# Patient Record
Sex: Male | Born: 1955 | Race: White | Hispanic: No | Marital: Married | Smoking: Current every day smoker
Health system: Southern US, Community
[De-identification: ages and names within clinical notes are randomized; demographics above are authoritative.]

## PROBLEM LIST (undated history)

## (undated) DIAGNOSIS — E669 Obesity, unspecified: Secondary | ICD-10-CM

## (undated) DIAGNOSIS — I428 Other cardiomyopathies: Secondary | ICD-10-CM

## (undated) DIAGNOSIS — J449 Chronic obstructive pulmonary disease, unspecified: Secondary | ICD-10-CM

## (undated) DIAGNOSIS — J189 Pneumonia, unspecified organism: Secondary | ICD-10-CM

## (undated) DIAGNOSIS — G8929 Other chronic pain: Secondary | ICD-10-CM

## (undated) DIAGNOSIS — I2510 Atherosclerotic heart disease of native coronary artery without angina pectoris: Secondary | ICD-10-CM

## (undated) DIAGNOSIS — R569 Unspecified convulsions: Secondary | ICD-10-CM

## (undated) DIAGNOSIS — M549 Dorsalgia, unspecified: Secondary | ICD-10-CM

## (undated) DIAGNOSIS — N183 Chronic kidney disease, stage 3 unspecified: Secondary | ICD-10-CM

## (undated) DIAGNOSIS — Z79899 Other long term (current) drug therapy: Secondary | ICD-10-CM

## (undated) DIAGNOSIS — E1140 Type 2 diabetes mellitus with diabetic neuropathy, unspecified: Secondary | ICD-10-CM

## (undated) DIAGNOSIS — K5790 Diverticulosis of intestine, part unspecified, without perforation or abscess without bleeding: Secondary | ICD-10-CM

## (undated) DIAGNOSIS — K3184 Gastroparesis: Secondary | ICD-10-CM

## (undated) DIAGNOSIS — K449 Diaphragmatic hernia without obstruction or gangrene: Secondary | ICD-10-CM

## (undated) DIAGNOSIS — I10 Essential (primary) hypertension: Secondary | ICD-10-CM

## (undated) DIAGNOSIS — J9601 Acute respiratory failure with hypoxia: Secondary | ICD-10-CM

## (undated) DIAGNOSIS — R251 Tremor, unspecified: Secondary | ICD-10-CM

## (undated) DIAGNOSIS — Z9889 Other specified postprocedural states: Secondary | ICD-10-CM

## (undated) DIAGNOSIS — E119 Type 2 diabetes mellitus without complications: Secondary | ICD-10-CM

## (undated) DIAGNOSIS — F329 Major depressive disorder, single episode, unspecified: Secondary | ICD-10-CM

## (undated) DIAGNOSIS — G253 Myoclonus: Secondary | ICD-10-CM

## (undated) DIAGNOSIS — IMO0001 Reserved for inherently not codable concepts without codable children: Secondary | ICD-10-CM

## (undated) DIAGNOSIS — M1990 Unspecified osteoarthritis, unspecified site: Secondary | ICD-10-CM

## (undated) DIAGNOSIS — I509 Heart failure, unspecified: Secondary | ICD-10-CM

## (undated) DIAGNOSIS — F32A Depression, unspecified: Secondary | ICD-10-CM

## (undated) DIAGNOSIS — K210 Gastro-esophageal reflux disease with esophagitis: Secondary | ICD-10-CM

## (undated) DIAGNOSIS — G4730 Sleep apnea, unspecified: Secondary | ICD-10-CM

## (undated) HISTORY — DX: Heart failure, unspecified: I50.9

## (undated) HISTORY — PX: HAND SURGERY: SHX662

## (undated) HISTORY — PX: ANTERIOR LUMBAR CORPECTOMY W/ FUSION: SHX1167

## (undated) HISTORY — PX: OTHER SURGICAL HISTORY: SHX169

## (undated) HISTORY — DX: Obesity, unspecified: E66.9

## (undated) HISTORY — PX: COLONOSCOPY: SHX174

## (undated) HISTORY — DX: Sleep apnea, unspecified: G47.30

## (undated) HISTORY — DX: Major depressive disorder, single episode, unspecified: F32.9

## (undated) HISTORY — DX: Diaphragmatic hernia without obstruction or gangrene: K44.9

## (undated) HISTORY — DX: Type 2 diabetes mellitus with diabetic neuropathy, unspecified: E11.40

## (undated) HISTORY — DX: Other specified postprocedural states: Z98.890

## (undated) HISTORY — PX: KNEE ARTHROSCOPY: SUR90

## (undated) HISTORY — DX: Gastroparesis: K31.84

## (undated) HISTORY — DX: Unspecified osteoarthritis, unspecified site: M19.90

## (undated) HISTORY — DX: Gastro-esophageal reflux disease with esophagitis: K21.0

## (undated) HISTORY — DX: Diverticulosis of intestine, part unspecified, without perforation or abscess without bleeding: K57.90

## (undated) HISTORY — PX: NECK SURGERY: SHX720

## (undated) HISTORY — DX: Reserved for inherently not codable concepts without codable children: IMO0001

## (undated) HISTORY — PX: APPENDECTOMY: SHX54

## (undated) HISTORY — DX: Depression, unspecified: F32.A

---

## 2005-09-04 ENCOUNTER — Ambulatory Visit: Payer: Self-pay | Admitting: Family Medicine

## 2005-09-07 ENCOUNTER — Encounter (INDEPENDENT_AMBULATORY_CARE_PROVIDER_SITE_OTHER): Payer: Self-pay | Admitting: Internal Medicine

## 2005-09-09 ENCOUNTER — Encounter (INDEPENDENT_AMBULATORY_CARE_PROVIDER_SITE_OTHER): Payer: Self-pay | Admitting: Internal Medicine

## 2005-09-11 ENCOUNTER — Ambulatory Visit: Payer: Self-pay | Admitting: Family Medicine

## 2005-09-22 ENCOUNTER — Encounter: Payer: Self-pay | Admitting: Cardiology

## 2005-09-22 ENCOUNTER — Ambulatory Visit: Payer: Self-pay | Admitting: *Deleted

## 2005-09-25 ENCOUNTER — Ambulatory Visit: Payer: Self-pay | Admitting: Cardiology

## 2005-09-25 ENCOUNTER — Ambulatory Visit: Payer: Self-pay | Admitting: Internal Medicine

## 2005-10-05 ENCOUNTER — Ambulatory Visit: Payer: Self-pay | Admitting: Cardiology

## 2005-10-12 ENCOUNTER — Ambulatory Visit: Payer: Self-pay | Admitting: Internal Medicine

## 2005-11-13 ENCOUNTER — Ambulatory Visit: Payer: Self-pay | Admitting: Internal Medicine

## 2005-11-25 ENCOUNTER — Ambulatory Visit: Payer: Self-pay | Admitting: Internal Medicine

## 2006-01-15 ENCOUNTER — Ambulatory Visit: Payer: Self-pay | Admitting: Internal Medicine

## 2006-01-15 LAB — CONVERTED CEMR LAB
Cholesterol: 229 mg/dL
RBC count: 4.54 10*6/uL
Total CHOL/HDL Ratio: 8.2
Triglycerides: 425 mg/dL
aPTT: 25.2 s

## 2006-01-21 ENCOUNTER — Ambulatory Visit: Payer: Self-pay | Admitting: Family Medicine

## 2006-02-12 ENCOUNTER — Ambulatory Visit: Payer: Self-pay | Admitting: Internal Medicine

## 2006-02-12 LAB — CONVERTED CEMR LAB
INR: 1.3
aPTT: 14.3 s

## 2006-03-29 ENCOUNTER — Ambulatory Visit: Payer: Self-pay | Admitting: Internal Medicine

## 2006-04-01 ENCOUNTER — Encounter: Payer: Self-pay | Admitting: Internal Medicine

## 2006-04-01 DIAGNOSIS — M129 Arthropathy, unspecified: Secondary | ICD-10-CM | POA: Insufficient documentation

## 2006-04-01 DIAGNOSIS — I2510 Atherosclerotic heart disease of native coronary artery without angina pectoris: Secondary | ICD-10-CM | POA: Insufficient documentation

## 2006-04-01 DIAGNOSIS — G609 Hereditary and idiopathic neuropathy, unspecified: Secondary | ICD-10-CM | POA: Insufficient documentation

## 2006-04-01 DIAGNOSIS — K219 Gastro-esophageal reflux disease without esophagitis: Secondary | ICD-10-CM

## 2006-04-01 DIAGNOSIS — I420 Dilated cardiomyopathy: Secondary | ICD-10-CM | POA: Insufficient documentation

## 2006-04-01 DIAGNOSIS — F411 Generalized anxiety disorder: Secondary | ICD-10-CM | POA: Insufficient documentation

## 2006-04-01 DIAGNOSIS — M545 Low back pain: Secondary | ICD-10-CM | POA: Insufficient documentation

## 2006-04-01 DIAGNOSIS — E785 Hyperlipidemia, unspecified: Secondary | ICD-10-CM | POA: Insufficient documentation

## 2006-04-01 DIAGNOSIS — I252 Old myocardial infarction: Secondary | ICD-10-CM | POA: Insufficient documentation

## 2006-04-01 DIAGNOSIS — F3289 Other specified depressive episodes: Secondary | ICD-10-CM | POA: Insufficient documentation

## 2006-04-01 DIAGNOSIS — I10 Essential (primary) hypertension: Secondary | ICD-10-CM | POA: Insufficient documentation

## 2006-04-01 DIAGNOSIS — I509 Heart failure, unspecified: Secondary | ICD-10-CM | POA: Insufficient documentation

## 2006-04-01 DIAGNOSIS — F329 Major depressive disorder, single episode, unspecified: Secondary | ICD-10-CM | POA: Insufficient documentation

## 2006-04-26 ENCOUNTER — Ambulatory Visit: Payer: Self-pay | Admitting: Internal Medicine

## 2006-04-26 LAB — CONVERTED CEMR LAB
Eosinophils Relative: 3 %
Hemoglobin: 14.1 g/dL
Monocytes Absolute: 0.6 10*3/uL
Monocytes Relative: 6 %
RBC: 4.85 M/uL
WBC: 10.4 10*3/uL

## 2006-05-11 ENCOUNTER — Ambulatory Visit: Payer: Self-pay | Admitting: Internal Medicine

## 2006-05-11 LAB — CONVERTED CEMR LAB: Prothrombin Time: 15 s

## 2006-05-21 ENCOUNTER — Ambulatory Visit: Payer: Self-pay | Admitting: Internal Medicine

## 2006-05-21 LAB — CONVERTED CEMR LAB: INR: 2.2

## 2006-06-04 ENCOUNTER — Ambulatory Visit: Payer: Self-pay | Admitting: Internal Medicine

## 2006-06-04 DIAGNOSIS — F528 Other sexual dysfunction not due to a substance or known physiological condition: Secondary | ICD-10-CM

## 2006-06-04 LAB — CONVERTED CEMR LAB: INR: 2.4

## 2006-06-05 ENCOUNTER — Encounter (INDEPENDENT_AMBULATORY_CARE_PROVIDER_SITE_OTHER): Payer: Self-pay | Admitting: Internal Medicine

## 2006-06-16 ENCOUNTER — Encounter (INDEPENDENT_AMBULATORY_CARE_PROVIDER_SITE_OTHER): Payer: Self-pay | Admitting: Internal Medicine

## 2006-06-22 ENCOUNTER — Ambulatory Visit: Payer: Self-pay | Admitting: Internal Medicine

## 2006-06-22 LAB — CONVERTED CEMR LAB: Inflenza A Ag: NEGATIVE

## 2006-06-24 ENCOUNTER — Ambulatory Visit: Payer: Self-pay | Admitting: Internal Medicine

## 2006-06-24 LAB — CONVERTED CEMR LAB: Prothrombin Time: 13.7 s

## 2006-07-02 ENCOUNTER — Ambulatory Visit: Payer: Self-pay | Admitting: Internal Medicine

## 2006-07-02 LAB — CONVERTED CEMR LAB
Calcium: 9.5 mg/dL (ref 8.4–10.5)
Chloride: 105 meq/L (ref 96–112)
Glucose, Bld: 398 mg/dL — ABNORMAL HIGH (ref 70–99)
INR: 1.9
INR: 1.9 — ABNORMAL HIGH (ref 0.0–1.5)
Prothrombin Time: 23 s — ABNORMAL HIGH (ref 11.6–15.2)

## 2006-07-20 ENCOUNTER — Encounter (INDEPENDENT_AMBULATORY_CARE_PROVIDER_SITE_OTHER): Payer: Self-pay | Admitting: Internal Medicine

## 2006-07-21 ENCOUNTER — Ambulatory Visit: Payer: Self-pay | Admitting: Internal Medicine

## 2006-07-21 DIAGNOSIS — M720 Palmar fascial fibromatosis [Dupuytren]: Secondary | ICD-10-CM

## 2006-07-21 DIAGNOSIS — R209 Unspecified disturbances of skin sensation: Secondary | ICD-10-CM

## 2006-07-21 LAB — CONVERTED CEMR LAB: Hgb A1c MFr Bld: 9.4 %

## 2006-07-23 ENCOUNTER — Ambulatory Visit (HOSPITAL_COMMUNITY): Admission: RE | Admit: 2006-07-23 | Discharge: 2006-07-23 | Payer: Self-pay | Admitting: Internal Medicine

## 2006-07-23 LAB — CONVERTED CEMR LAB
CO2: 17 meq/L — ABNORMAL LOW (ref 19–32)
Calcium: 9.6 mg/dL (ref 8.4–10.5)
Chloride: 101 meq/L (ref 96–112)
Creatinine, Ser: 1.81 mg/dL — ABNORMAL HIGH (ref 0.40–1.50)
Glucose, Bld: 157 mg/dL — ABNORMAL HIGH (ref 70–99)
Potassium: 4.9 meq/L (ref 3.5–5.3)

## 2006-07-30 ENCOUNTER — Ambulatory Visit: Payer: Self-pay | Admitting: Internal Medicine

## 2006-07-30 DIAGNOSIS — G5600 Carpal tunnel syndrome, unspecified upper limb: Secondary | ICD-10-CM

## 2006-07-31 ENCOUNTER — Encounter (INDEPENDENT_AMBULATORY_CARE_PROVIDER_SITE_OTHER): Payer: Self-pay | Admitting: Internal Medicine

## 2006-08-02 ENCOUNTER — Telehealth (INDEPENDENT_AMBULATORY_CARE_PROVIDER_SITE_OTHER): Payer: Self-pay | Admitting: Internal Medicine

## 2006-08-02 ENCOUNTER — Encounter (INDEPENDENT_AMBULATORY_CARE_PROVIDER_SITE_OTHER): Payer: Self-pay | Admitting: Internal Medicine

## 2006-08-02 LAB — CONVERTED CEMR LAB
Chloride: 101 meq/L (ref 96–112)
Creatinine, Ser: 2.38 mg/dL — ABNORMAL HIGH (ref 0.40–1.50)
Sodium: 131 meq/L — ABNORMAL LOW (ref 135–145)

## 2006-08-03 ENCOUNTER — Ambulatory Visit (HOSPITAL_COMMUNITY): Admission: RE | Admit: 2006-08-03 | Discharge: 2006-08-03 | Payer: Self-pay | Admitting: Internal Medicine

## 2006-08-04 ENCOUNTER — Encounter (INDEPENDENT_AMBULATORY_CARE_PROVIDER_SITE_OTHER): Payer: Self-pay | Admitting: Internal Medicine

## 2006-08-05 ENCOUNTER — Ambulatory Visit: Payer: Self-pay | Admitting: Internal Medicine

## 2006-08-05 LAB — CONVERTED CEMR LAB
Glucose, Bld: 228 mg/dL — ABNORMAL HIGH (ref 70–99)
INR: 1.9
Potassium: 4.8 meq/L (ref 3.5–5.3)
Sodium: 134 meq/L — ABNORMAL LOW (ref 135–145)

## 2006-08-11 ENCOUNTER — Ambulatory Visit: Payer: Self-pay | Admitting: Internal Medicine

## 2006-08-11 ENCOUNTER — Emergency Department (HOSPITAL_COMMUNITY): Admission: EM | Admit: 2006-08-11 | Discharge: 2006-08-11 | Payer: Self-pay | Admitting: Emergency Medicine

## 2006-08-11 LAB — CONVERTED CEMR LAB
Hemoglobin: 13.9 g/dL
Prothrombin Time: 12.2 s

## 2006-08-12 ENCOUNTER — Telehealth (INDEPENDENT_AMBULATORY_CARE_PROVIDER_SITE_OTHER): Payer: Self-pay | Admitting: Internal Medicine

## 2006-08-20 ENCOUNTER — Ambulatory Visit: Payer: Self-pay | Admitting: Internal Medicine

## 2006-08-20 LAB — CONVERTED CEMR LAB: Prothrombin Time: 18.6 s

## 2006-08-31 ENCOUNTER — Encounter (INDEPENDENT_AMBULATORY_CARE_PROVIDER_SITE_OTHER): Payer: Self-pay | Admitting: Internal Medicine

## 2006-09-03 ENCOUNTER — Ambulatory Visit: Payer: Self-pay | Admitting: Internal Medicine

## 2006-09-03 LAB — CONVERTED CEMR LAB: Prothrombin Time: 18.3 s

## 2006-09-06 LAB — CONVERTED CEMR LAB
BUN: 18 mg/dL (ref 6–23)
Calcium: 9 mg/dL (ref 8.4–10.5)
Creatinine, Ser: 1.19 mg/dL (ref 0.40–1.50)
Potassium: 3.6 meq/L (ref 3.5–5.3)

## 2006-10-01 ENCOUNTER — Ambulatory Visit: Payer: Self-pay | Admitting: Internal Medicine

## 2006-10-01 LAB — CONVERTED CEMR LAB: INR: 2.2

## 2006-11-17 ENCOUNTER — Ambulatory Visit: Payer: Self-pay | Admitting: Internal Medicine

## 2006-11-17 DIAGNOSIS — G4700 Insomnia, unspecified: Secondary | ICD-10-CM

## 2006-11-17 LAB — CONVERTED CEMR LAB
INR: 1.5
Prothrombin Time: 15.3 s

## 2006-11-19 ENCOUNTER — Encounter (INDEPENDENT_AMBULATORY_CARE_PROVIDER_SITE_OTHER): Payer: Self-pay | Admitting: Internal Medicine

## 2006-11-28 ENCOUNTER — Encounter (INDEPENDENT_AMBULATORY_CARE_PROVIDER_SITE_OTHER): Payer: Self-pay | Admitting: Internal Medicine

## 2006-12-01 ENCOUNTER — Ambulatory Visit: Payer: Self-pay | Admitting: Internal Medicine

## 2006-12-01 DIAGNOSIS — M542 Cervicalgia: Secondary | ICD-10-CM

## 2006-12-28 ENCOUNTER — Encounter (INDEPENDENT_AMBULATORY_CARE_PROVIDER_SITE_OTHER): Payer: Self-pay | Admitting: Internal Medicine

## 2007-01-03 ENCOUNTER — Ambulatory Visit: Payer: Self-pay | Admitting: Internal Medicine

## 2007-01-03 LAB — CONVERTED CEMR LAB: INR: 1.8

## 2007-01-04 ENCOUNTER — Telehealth (INDEPENDENT_AMBULATORY_CARE_PROVIDER_SITE_OTHER): Payer: Self-pay | Admitting: *Deleted

## 2007-01-13 ENCOUNTER — Encounter (INDEPENDENT_AMBULATORY_CARE_PROVIDER_SITE_OTHER): Payer: Self-pay | Admitting: Internal Medicine

## 2007-01-20 ENCOUNTER — Encounter (INDEPENDENT_AMBULATORY_CARE_PROVIDER_SITE_OTHER): Payer: Self-pay | Admitting: Internal Medicine

## 2007-01-25 ENCOUNTER — Encounter (INDEPENDENT_AMBULATORY_CARE_PROVIDER_SITE_OTHER): Payer: Self-pay | Admitting: Internal Medicine

## 2007-01-31 ENCOUNTER — Ambulatory Visit: Payer: Self-pay | Admitting: Internal Medicine

## 2007-01-31 LAB — CONVERTED CEMR LAB
INR: 1.3
Prothrombin Time: 14.1 s

## 2007-02-15 ENCOUNTER — Encounter (INDEPENDENT_AMBULATORY_CARE_PROVIDER_SITE_OTHER): Payer: Self-pay | Admitting: Internal Medicine

## 2007-02-18 ENCOUNTER — Encounter (INDEPENDENT_AMBULATORY_CARE_PROVIDER_SITE_OTHER): Payer: Self-pay | Admitting: Internal Medicine

## 2007-03-02 ENCOUNTER — Telehealth (INDEPENDENT_AMBULATORY_CARE_PROVIDER_SITE_OTHER): Payer: Self-pay | Admitting: *Deleted

## 2007-03-02 ENCOUNTER — Ambulatory Visit: Payer: Self-pay | Admitting: Internal Medicine

## 2007-03-04 ENCOUNTER — Ambulatory Visit (HOSPITAL_COMMUNITY): Admission: RE | Admit: 2007-03-04 | Discharge: 2007-03-04 | Payer: Self-pay | Admitting: Internal Medicine

## 2007-03-07 ENCOUNTER — Telehealth (INDEPENDENT_AMBULATORY_CARE_PROVIDER_SITE_OTHER): Payer: Self-pay | Admitting: *Deleted

## 2007-03-08 ENCOUNTER — Encounter (INDEPENDENT_AMBULATORY_CARE_PROVIDER_SITE_OTHER): Payer: Self-pay | Admitting: Internal Medicine

## 2007-03-09 ENCOUNTER — Telehealth (INDEPENDENT_AMBULATORY_CARE_PROVIDER_SITE_OTHER): Payer: Self-pay | Admitting: *Deleted

## 2007-03-15 ENCOUNTER — Encounter (INDEPENDENT_AMBULATORY_CARE_PROVIDER_SITE_OTHER): Payer: Self-pay | Admitting: Internal Medicine

## 2007-03-15 LAB — CONVERTED CEMR LAB
ALT: 18 units/L (ref 0–53)
BUN: 25 mg/dL — ABNORMAL HIGH (ref 6–23)
CO2: 23 meq/L (ref 19–32)
Calcium: 9.3 mg/dL (ref 8.4–10.5)
Chloride: 105 meq/L (ref 96–112)
Cholesterol: 263 mg/dL — ABNORMAL HIGH (ref 0–200)
Creatinine, Ser: 1.56 mg/dL — ABNORMAL HIGH (ref 0.40–1.50)
HDL: 28 mg/dL — ABNORMAL LOW (ref >39)
Total Bilirubin: 0.3 mg/dL (ref 0.3–1.2)
Total CHOL/HDL Ratio: 9.4
Triglycerides: 262 mg/dL — ABNORMAL HIGH (ref <150)
VLDL: 52 mg/dL — ABNORMAL HIGH (ref 0–40)

## 2007-03-16 ENCOUNTER — Telehealth (INDEPENDENT_AMBULATORY_CARE_PROVIDER_SITE_OTHER): Payer: Self-pay | Admitting: Internal Medicine

## 2007-03-16 ENCOUNTER — Ambulatory Visit: Payer: Self-pay | Admitting: Internal Medicine

## 2007-03-18 ENCOUNTER — Encounter (INDEPENDENT_AMBULATORY_CARE_PROVIDER_SITE_OTHER): Payer: Self-pay | Admitting: Internal Medicine

## 2007-03-21 ENCOUNTER — Telehealth (INDEPENDENT_AMBULATORY_CARE_PROVIDER_SITE_OTHER): Payer: Self-pay | Admitting: *Deleted

## 2007-03-21 LAB — CONVERTED CEMR LAB
Creatinine, Urine: 90.9 mg/dL
Microalb Creat Ratio: 639.2 mg/g — ABNORMAL HIGH (ref 0.0–30.0)
Microalb, Ur: 58.1 mg/dL — ABNORMAL HIGH (ref 0.00–1.89)

## 2007-03-22 ENCOUNTER — Telehealth (INDEPENDENT_AMBULATORY_CARE_PROVIDER_SITE_OTHER): Payer: Self-pay | Admitting: *Deleted

## 2007-03-28 ENCOUNTER — Encounter (INDEPENDENT_AMBULATORY_CARE_PROVIDER_SITE_OTHER): Payer: Self-pay | Admitting: Internal Medicine

## 2007-05-04 ENCOUNTER — Ambulatory Visit: Payer: Self-pay | Admitting: Internal Medicine

## 2007-05-04 ENCOUNTER — Telehealth (INDEPENDENT_AMBULATORY_CARE_PROVIDER_SITE_OTHER): Payer: Self-pay | Admitting: *Deleted

## 2007-05-04 LAB — CONVERTED CEMR LAB
INR: 1.8
Prothrombin Time: 16.7 s

## 2007-05-05 ENCOUNTER — Telehealth (INDEPENDENT_AMBULATORY_CARE_PROVIDER_SITE_OTHER): Payer: Self-pay | Admitting: *Deleted

## 2007-05-06 ENCOUNTER — Encounter (INDEPENDENT_AMBULATORY_CARE_PROVIDER_SITE_OTHER): Payer: Self-pay | Admitting: Internal Medicine

## 2007-05-10 LAB — CONVERTED CEMR LAB
ALT: 19 units/L (ref 0–53)
AST: 13 units/L (ref 0–37)
Alkaline Phosphatase: 78 units/L (ref 39–117)
CO2: 25 meq/L (ref 19–32)
Creatinine, Ser: 1.8 mg/dL — ABNORMAL HIGH (ref 0.40–1.50)
Sodium: 142 meq/L (ref 135–145)
Total Bilirubin: 0.3 mg/dL (ref 0.3–1.2)
Total Protein: 6.9 g/dL (ref 6.0–8.3)

## 2007-05-19 ENCOUNTER — Telehealth (INDEPENDENT_AMBULATORY_CARE_PROVIDER_SITE_OTHER): Payer: Self-pay | Admitting: *Deleted

## 2007-05-20 ENCOUNTER — Encounter (INDEPENDENT_AMBULATORY_CARE_PROVIDER_SITE_OTHER): Payer: Self-pay | Admitting: Internal Medicine

## 2007-05-26 ENCOUNTER — Ambulatory Visit: Payer: Self-pay | Admitting: Internal Medicine

## 2007-05-26 ENCOUNTER — Telehealth (INDEPENDENT_AMBULATORY_CARE_PROVIDER_SITE_OTHER): Payer: Self-pay | Admitting: *Deleted

## 2007-05-26 LAB — CONVERTED CEMR LAB: Prothrombin Time: 25 s

## 2007-06-10 ENCOUNTER — Ambulatory Visit: Payer: Self-pay | Admitting: Internal Medicine

## 2007-06-10 LAB — CONVERTED CEMR LAB
Hgb A1c MFr Bld: 10.2 %
INR: 3.9
Prothrombin Time: 23.9 s

## 2007-06-11 ENCOUNTER — Encounter (INDEPENDENT_AMBULATORY_CARE_PROVIDER_SITE_OTHER): Payer: Self-pay | Admitting: Internal Medicine

## 2007-06-12 LAB — CONVERTED CEMR LAB
ALT: 19 units/L (ref 0–53)
AST: 16 units/L (ref 0–37)
Albumin: 4.1 g/dL (ref 3.5–5.2)
Alkaline Phosphatase: 75 units/L (ref 39–117)
BUN: 31 mg/dL — ABNORMAL HIGH (ref 6–23)
Potassium: 4.9 meq/L (ref 3.5–5.3)

## 2007-06-13 ENCOUNTER — Telehealth (INDEPENDENT_AMBULATORY_CARE_PROVIDER_SITE_OTHER): Payer: Self-pay | Admitting: *Deleted

## 2007-06-14 ENCOUNTER — Telehealth (INDEPENDENT_AMBULATORY_CARE_PROVIDER_SITE_OTHER): Payer: Self-pay | Admitting: Internal Medicine

## 2007-06-27 ENCOUNTER — Encounter (INDEPENDENT_AMBULATORY_CARE_PROVIDER_SITE_OTHER): Payer: Self-pay | Admitting: Internal Medicine

## 2007-07-01 ENCOUNTER — Ambulatory Visit: Payer: Self-pay | Admitting: Internal Medicine

## 2007-07-05 ENCOUNTER — Encounter (INDEPENDENT_AMBULATORY_CARE_PROVIDER_SITE_OTHER): Payer: Self-pay | Admitting: Internal Medicine

## 2007-07-29 ENCOUNTER — Encounter (INDEPENDENT_AMBULATORY_CARE_PROVIDER_SITE_OTHER): Payer: Self-pay | Admitting: Internal Medicine

## 2007-08-03 ENCOUNTER — Ambulatory Visit: Payer: Self-pay | Admitting: Internal Medicine

## 2007-08-03 LAB — CONVERTED CEMR LAB
INR: 3.2
Prothrombin Time: 21.5 s

## 2007-08-16 ENCOUNTER — Encounter (INDEPENDENT_AMBULATORY_CARE_PROVIDER_SITE_OTHER): Payer: Self-pay | Admitting: Internal Medicine

## 2007-08-22 ENCOUNTER — Encounter (INDEPENDENT_AMBULATORY_CARE_PROVIDER_SITE_OTHER): Payer: Self-pay | Admitting: Internal Medicine

## 2007-08-31 ENCOUNTER — Ambulatory Visit: Payer: Self-pay | Admitting: Internal Medicine

## 2007-08-31 LAB — CONVERTED CEMR LAB: Prothrombin Time: 30.8 s

## 2007-09-01 ENCOUNTER — Telehealth (INDEPENDENT_AMBULATORY_CARE_PROVIDER_SITE_OTHER): Payer: Self-pay | Admitting: Internal Medicine

## 2007-09-01 ENCOUNTER — Encounter (INDEPENDENT_AMBULATORY_CARE_PROVIDER_SITE_OTHER): Payer: Self-pay | Admitting: Internal Medicine

## 2007-09-01 LAB — CONVERTED CEMR LAB
AST: 17 units/L (ref 0–37)
Alkaline Phosphatase: 75 units/L (ref 39–117)
BUN: 31 mg/dL — ABNORMAL HIGH (ref 6–23)
Basophils Relative: 1 % (ref 0–1)
Calcium: 8.6 mg/dL (ref 8.4–10.5)
Chloride: 103 meq/L (ref 96–112)
Creatinine, Ser: 1.82 mg/dL — ABNORMAL HIGH (ref 0.40–1.50)
Eosinophils Absolute: 0.3 10*3/uL (ref 0.0–0.7)
Eosinophils Relative: 3 % (ref 0–5)
HDL: 26 mg/dL — ABNORMAL LOW (ref >39)
Hemoglobin: 13.5 g/dL (ref 13.0–17.0)
MCHC: 33.6 g/dL (ref 30.0–36.0)
MCV: 84.1 fL (ref 78.0–100.0)
Monocytes Absolute: 0.6 10*3/uL (ref 0.1–1.0)
Monocytes Relative: 7 % (ref 3–12)
RBC: 4.78 M/uL (ref 4.22–5.81)
Total Bilirubin: 0.3 mg/dL (ref 0.3–1.2)
Total CHOL/HDL Ratio: 4.8

## 2007-09-20 ENCOUNTER — Ambulatory Visit: Payer: Self-pay | Admitting: Internal Medicine

## 2007-09-20 LAB — CONVERTED CEMR LAB: Prothrombin Time: 24.8 s

## 2007-10-11 ENCOUNTER — Ambulatory Visit: Payer: Self-pay | Admitting: Internal Medicine

## 2007-10-19 ENCOUNTER — Ambulatory Visit: Payer: Self-pay | Admitting: Internal Medicine

## 2007-10-19 LAB — CONVERTED CEMR LAB
Hemoglobin: 11.2 g/dL
Inflenza A Ag: NEGATIVE
OCCULT 1: NEGATIVE

## 2007-11-01 ENCOUNTER — Ambulatory Visit: Payer: Self-pay | Admitting: Internal Medicine

## 2007-11-01 LAB — CONVERTED CEMR LAB: Prothrombin Time: 16.6 s

## 2007-11-22 ENCOUNTER — Ambulatory Visit: Payer: Self-pay | Admitting: Internal Medicine

## 2007-12-09 ENCOUNTER — Encounter (INDEPENDENT_AMBULATORY_CARE_PROVIDER_SITE_OTHER): Payer: Self-pay | Admitting: Internal Medicine

## 2007-12-12 ENCOUNTER — Encounter (INDEPENDENT_AMBULATORY_CARE_PROVIDER_SITE_OTHER): Payer: Self-pay | Admitting: Internal Medicine

## 2007-12-13 ENCOUNTER — Ambulatory Visit: Payer: Self-pay | Admitting: Internal Medicine

## 2007-12-13 DIAGNOSIS — G2581 Restless legs syndrome: Secondary | ICD-10-CM

## 2007-12-13 LAB — CONVERTED CEMR LAB
Blood Glucose, Fingerstick: 365
INR: 1.7

## 2007-12-15 ENCOUNTER — Encounter (INDEPENDENT_AMBULATORY_CARE_PROVIDER_SITE_OTHER): Payer: Self-pay | Admitting: Internal Medicine

## 2007-12-20 ENCOUNTER — Encounter (INDEPENDENT_AMBULATORY_CARE_PROVIDER_SITE_OTHER): Payer: Self-pay | Admitting: Internal Medicine

## 2007-12-21 ENCOUNTER — Encounter (INDEPENDENT_AMBULATORY_CARE_PROVIDER_SITE_OTHER): Payer: Self-pay | Admitting: Internal Medicine

## 2007-12-23 ENCOUNTER — Ambulatory Visit: Payer: Self-pay | Admitting: Internal Medicine

## 2007-12-23 LAB — CONVERTED CEMR LAB: INR: 1.8

## 2007-12-26 ENCOUNTER — Encounter (INDEPENDENT_AMBULATORY_CARE_PROVIDER_SITE_OTHER): Payer: Self-pay | Admitting: Internal Medicine

## 2007-12-27 ENCOUNTER — Encounter (INDEPENDENT_AMBULATORY_CARE_PROVIDER_SITE_OTHER): Payer: Self-pay | Admitting: Internal Medicine

## 2008-01-03 ENCOUNTER — Telehealth (INDEPENDENT_AMBULATORY_CARE_PROVIDER_SITE_OTHER): Payer: Self-pay | Admitting: Internal Medicine

## 2008-01-03 ENCOUNTER — Encounter (INDEPENDENT_AMBULATORY_CARE_PROVIDER_SITE_OTHER): Payer: Self-pay | Admitting: Internal Medicine

## 2008-01-05 ENCOUNTER — Ambulatory Visit: Payer: Self-pay | Admitting: Internal Medicine

## 2008-01-05 LAB — CONVERTED CEMR LAB: INR: 6.4

## 2008-01-06 ENCOUNTER — Ambulatory Visit (HOSPITAL_COMMUNITY): Admission: RE | Admit: 2008-01-06 | Discharge: 2008-01-06 | Payer: Self-pay

## 2008-01-09 ENCOUNTER — Encounter (INDEPENDENT_AMBULATORY_CARE_PROVIDER_SITE_OTHER): Payer: Self-pay | Admitting: Internal Medicine

## 2008-01-11 ENCOUNTER — Encounter (INDEPENDENT_AMBULATORY_CARE_PROVIDER_SITE_OTHER): Payer: Self-pay | Admitting: Internal Medicine

## 2008-01-12 ENCOUNTER — Telehealth (INDEPENDENT_AMBULATORY_CARE_PROVIDER_SITE_OTHER): Payer: Self-pay | Admitting: *Deleted

## 2008-01-16 ENCOUNTER — Ambulatory Visit: Payer: Self-pay | Admitting: Internal Medicine

## 2008-01-19 ENCOUNTER — Telehealth (INDEPENDENT_AMBULATORY_CARE_PROVIDER_SITE_OTHER): Payer: Self-pay | Admitting: Internal Medicine

## 2008-01-23 ENCOUNTER — Encounter (INDEPENDENT_AMBULATORY_CARE_PROVIDER_SITE_OTHER): Payer: Self-pay | Admitting: Internal Medicine

## 2008-01-24 ENCOUNTER — Encounter (INDEPENDENT_AMBULATORY_CARE_PROVIDER_SITE_OTHER): Payer: Self-pay | Admitting: Internal Medicine

## 2008-01-25 ENCOUNTER — Telehealth (INDEPENDENT_AMBULATORY_CARE_PROVIDER_SITE_OTHER): Payer: Self-pay | Admitting: Internal Medicine

## 2008-01-30 ENCOUNTER — Ambulatory Visit: Payer: Self-pay | Admitting: Internal Medicine

## 2008-02-03 ENCOUNTER — Telehealth (INDEPENDENT_AMBULATORY_CARE_PROVIDER_SITE_OTHER): Payer: Self-pay | Admitting: *Deleted

## 2008-02-17 ENCOUNTER — Encounter (INDEPENDENT_AMBULATORY_CARE_PROVIDER_SITE_OTHER): Payer: Self-pay | Admitting: Internal Medicine

## 2008-03-01 ENCOUNTER — Ambulatory Visit: Payer: Self-pay | Admitting: Internal Medicine

## 2008-03-01 LAB — CONVERTED CEMR LAB: Blood Glucose, Fingerstick: 207

## 2008-03-06 ENCOUNTER — Encounter (INDEPENDENT_AMBULATORY_CARE_PROVIDER_SITE_OTHER): Payer: Self-pay | Admitting: Internal Medicine

## 2008-03-12 ENCOUNTER — Encounter (INDEPENDENT_AMBULATORY_CARE_PROVIDER_SITE_OTHER): Payer: Self-pay | Admitting: Internal Medicine

## 2008-03-14 ENCOUNTER — Ambulatory Visit: Payer: Self-pay | Admitting: Internal Medicine

## 2008-03-15 ENCOUNTER — Encounter
Admission: RE | Admit: 2008-03-15 | Discharge: 2008-06-13 | Payer: Self-pay | Admitting: Physical Medicine & Rehabilitation

## 2008-03-16 ENCOUNTER — Ambulatory Visit: Payer: Self-pay | Admitting: Physical Medicine & Rehabilitation

## 2008-04-02 ENCOUNTER — Ambulatory Visit: Payer: Self-pay | Admitting: Physical Medicine & Rehabilitation

## 2008-04-30 ENCOUNTER — Ambulatory Visit: Payer: Self-pay | Admitting: Physical Medicine & Rehabilitation

## 2008-05-09 ENCOUNTER — Telehealth (INDEPENDENT_AMBULATORY_CARE_PROVIDER_SITE_OTHER): Payer: Self-pay | Admitting: *Deleted

## 2008-05-23 ENCOUNTER — Telehealth (INDEPENDENT_AMBULATORY_CARE_PROVIDER_SITE_OTHER): Payer: Self-pay | Admitting: *Deleted

## 2008-05-30 ENCOUNTER — Encounter (INDEPENDENT_AMBULATORY_CARE_PROVIDER_SITE_OTHER): Payer: Self-pay | Admitting: Internal Medicine

## 2008-05-31 ENCOUNTER — Encounter (INDEPENDENT_AMBULATORY_CARE_PROVIDER_SITE_OTHER): Payer: Self-pay | Admitting: Internal Medicine

## 2008-05-31 ENCOUNTER — Ambulatory Visit: Payer: Self-pay | Admitting: Physical Medicine & Rehabilitation

## 2008-06-04 ENCOUNTER — Ambulatory Visit: Payer: Self-pay | Admitting: Internal Medicine

## 2008-06-04 DIAGNOSIS — M25579 Pain in unspecified ankle and joints of unspecified foot: Secondary | ICD-10-CM | POA: Insufficient documentation

## 2008-06-05 LAB — CONVERTED CEMR LAB
Albumin: 4.1 g/dL (ref 3.5–5.2)
Alkaline Phosphatase: 80 units/L (ref 39–117)
BUN: 27 mg/dL — ABNORMAL HIGH (ref 6–23)
Cholesterol: 169 mg/dL (ref 0–200)
Eosinophils Absolute: 0.4 10*3/uL (ref 0.0–0.7)
Eosinophils Relative: 4 % (ref 0–5)
Glucose, Bld: 244 mg/dL — ABNORMAL HIGH (ref 70–99)
HCT: 38.2 % — ABNORMAL LOW (ref 39.0–52.0)
HDL: 32 mg/dL — ABNORMAL LOW (ref >39)
Hemoglobin: 12.8 g/dL — ABNORMAL LOW (ref 13.0–17.0)
LDL Cholesterol: 67 mg/dL (ref 0–99)
Lymphs Abs: 2.9 10*3/uL (ref 0.7–4.0)
MCV: 84 fL (ref 78.0–100.0)
Monocytes Absolute: 0.8 10*3/uL (ref 0.1–1.0)
Monocytes Relative: 9 % (ref 3–12)
RBC: 4.55 M/uL (ref 4.22–5.81)
Total Bilirubin: 0.3 mg/dL (ref 0.3–1.2)
Triglycerides: 352 mg/dL — ABNORMAL HIGH (ref <150)
VLDL: 70 mg/dL — ABNORMAL HIGH (ref 0–40)
WBC: 9.6 10*3/uL (ref 4.0–10.5)

## 2008-06-06 ENCOUNTER — Ambulatory Visit (HOSPITAL_COMMUNITY): Admission: RE | Admit: 2008-06-06 | Discharge: 2008-06-06 | Payer: Self-pay | Admitting: Internal Medicine

## 2008-06-15 ENCOUNTER — Encounter (INDEPENDENT_AMBULATORY_CARE_PROVIDER_SITE_OTHER): Payer: Self-pay | Admitting: Internal Medicine

## 2008-06-18 ENCOUNTER — Encounter (INDEPENDENT_AMBULATORY_CARE_PROVIDER_SITE_OTHER): Payer: Self-pay | Admitting: Internal Medicine

## 2008-06-20 ENCOUNTER — Ambulatory Visit: Payer: Self-pay | Admitting: Internal Medicine

## 2008-06-21 ENCOUNTER — Telehealth (INDEPENDENT_AMBULATORY_CARE_PROVIDER_SITE_OTHER): Payer: Self-pay | Admitting: Internal Medicine

## 2008-06-25 ENCOUNTER — Encounter
Admission: RE | Admit: 2008-06-25 | Discharge: 2008-09-23 | Payer: Self-pay | Admitting: Physical Medicine & Rehabilitation

## 2008-06-26 ENCOUNTER — Encounter (INDEPENDENT_AMBULATORY_CARE_PROVIDER_SITE_OTHER): Payer: Self-pay | Admitting: Internal Medicine

## 2008-06-26 ENCOUNTER — Ambulatory Visit: Payer: Self-pay | Admitting: Physical Medicine & Rehabilitation

## 2008-06-28 ENCOUNTER — Telehealth (INDEPENDENT_AMBULATORY_CARE_PROVIDER_SITE_OTHER): Payer: Self-pay | Admitting: Internal Medicine

## 2008-07-04 ENCOUNTER — Ambulatory Visit: Payer: Self-pay | Admitting: Internal Medicine

## 2008-07-04 DIAGNOSIS — N189 Chronic kidney disease, unspecified: Secondary | ICD-10-CM

## 2008-07-04 DIAGNOSIS — D649 Anemia, unspecified: Secondary | ICD-10-CM

## 2008-07-25 ENCOUNTER — Telehealth (INDEPENDENT_AMBULATORY_CARE_PROVIDER_SITE_OTHER): Payer: Self-pay | Admitting: Internal Medicine

## 2008-07-30 ENCOUNTER — Ambulatory Visit: Payer: Self-pay | Admitting: Physical Medicine & Rehabilitation

## 2008-07-31 ENCOUNTER — Ambulatory Visit: Payer: Self-pay | Admitting: Internal Medicine

## 2008-07-31 ENCOUNTER — Telehealth (INDEPENDENT_AMBULATORY_CARE_PROVIDER_SITE_OTHER): Payer: Self-pay | Admitting: Internal Medicine

## 2008-07-31 DIAGNOSIS — M79609 Pain in unspecified limb: Secondary | ICD-10-CM

## 2008-08-01 ENCOUNTER — Encounter (INDEPENDENT_AMBULATORY_CARE_PROVIDER_SITE_OTHER): Payer: Self-pay | Admitting: Internal Medicine

## 2008-08-15 ENCOUNTER — Encounter (INDEPENDENT_AMBULATORY_CARE_PROVIDER_SITE_OTHER): Payer: Self-pay | Admitting: Internal Medicine

## 2008-08-17 ENCOUNTER — Encounter (INDEPENDENT_AMBULATORY_CARE_PROVIDER_SITE_OTHER): Payer: Self-pay | Admitting: Internal Medicine

## 2008-08-20 ENCOUNTER — Encounter (INDEPENDENT_AMBULATORY_CARE_PROVIDER_SITE_OTHER): Payer: Self-pay | Admitting: Internal Medicine

## 2008-08-22 ENCOUNTER — Encounter (INDEPENDENT_AMBULATORY_CARE_PROVIDER_SITE_OTHER): Payer: Self-pay | Admitting: Internal Medicine

## 2008-08-31 ENCOUNTER — Encounter (INDEPENDENT_AMBULATORY_CARE_PROVIDER_SITE_OTHER): Payer: Self-pay | Admitting: Internal Medicine

## 2008-09-04 ENCOUNTER — Ambulatory Visit: Payer: Self-pay | Admitting: Internal Medicine

## 2008-09-12 ENCOUNTER — Encounter (INDEPENDENT_AMBULATORY_CARE_PROVIDER_SITE_OTHER): Payer: Self-pay | Admitting: Internal Medicine

## 2008-09-26 ENCOUNTER — Encounter (INDEPENDENT_AMBULATORY_CARE_PROVIDER_SITE_OTHER): Payer: Self-pay | Admitting: Orthopedic Surgery

## 2008-09-26 ENCOUNTER — Ambulatory Visit (HOSPITAL_COMMUNITY): Admission: RE | Admit: 2008-09-26 | Discharge: 2008-09-26 | Payer: Self-pay | Admitting: Orthopedic Surgery

## 2008-09-27 ENCOUNTER — Encounter
Admission: RE | Admit: 2008-09-27 | Discharge: 2008-12-26 | Payer: Self-pay | Admitting: Physical Medicine & Rehabilitation

## 2008-09-27 ENCOUNTER — Ambulatory Visit: Payer: Self-pay | Admitting: Physical Medicine & Rehabilitation

## 2008-10-01 ENCOUNTER — Encounter (INDEPENDENT_AMBULATORY_CARE_PROVIDER_SITE_OTHER): Payer: Self-pay | Admitting: Internal Medicine

## 2008-10-03 ENCOUNTER — Encounter (INDEPENDENT_AMBULATORY_CARE_PROVIDER_SITE_OTHER): Payer: Self-pay | Admitting: Internal Medicine

## 2008-10-16 ENCOUNTER — Ambulatory Visit: Payer: Self-pay | Admitting: Physical Medicine & Rehabilitation

## 2008-10-30 ENCOUNTER — Telehealth (INDEPENDENT_AMBULATORY_CARE_PROVIDER_SITE_OTHER): Payer: Self-pay | Admitting: *Deleted

## 2008-10-31 ENCOUNTER — Ambulatory Visit: Payer: Self-pay | Admitting: Internal Medicine

## 2008-10-31 DIAGNOSIS — IMO0001 Reserved for inherently not codable concepts without codable children: Secondary | ICD-10-CM

## 2008-11-02 ENCOUNTER — Encounter (INDEPENDENT_AMBULATORY_CARE_PROVIDER_SITE_OTHER): Payer: Self-pay | Admitting: Internal Medicine

## 2008-11-02 LAB — CONVERTED CEMR LAB
ALT: 16 units/L (ref 0–53)
AST: 14 units/L (ref 0–37)
Albumin: 4.1 g/dL (ref 3.5–5.2)
Alkaline Phosphatase: 78 units/L (ref 39–117)
Anti Nuclear Antibody(ANA): NEGATIVE
BUN: 45 mg/dL — ABNORMAL HIGH (ref 6–23)
Basophils Absolute: 0.1 10*3/uL (ref 0.0–0.1)
Basophils Relative: 1 % (ref 0–1)
CO2: 20 meq/L (ref 19–32)
CRP: 0.4 mg/dL (ref <0.6)
Calcium: 9.7 mg/dL (ref 8.4–10.5)
Chloride: 104 meq/L (ref 96–112)
Creatinine, Ser: 2.08 mg/dL — ABNORMAL HIGH (ref 0.40–1.50)
Eosinophils Absolute: 0.4 10*3/uL (ref 0.0–0.7)
Eosinophils Relative: 4 % (ref 0–5)
Free T4: 1.07 ng/dL (ref 0.80–1.80)
Glucose, Bld: 300 mg/dL — ABNORMAL HIGH (ref 70–99)
HCT: 38.9 % — ABNORMAL LOW (ref 39.0–52.0)
Hemoglobin: 13.4 g/dL (ref 13.0–17.0)
Lymphocytes Relative: 23 % (ref 12–46)
Lymphs Abs: 2.2 10*3/uL (ref 0.7–4.0)
MCHC: 34.4 g/dL (ref 30.0–36.0)
MCV: 81.7 fL (ref 78.0–100.0)
Monocytes Absolute: 0.6 10*3/uL (ref 0.1–1.0)
Monocytes Relative: 6 % (ref 3–12)
Neutro Abs: 6.5 10*3/uL (ref 1.7–7.7)
Neutrophils Relative %: 67 % (ref 43–77)
Platelets: 229 10*3/uL (ref 150–400)
Potassium: 4.3 meq/L (ref 3.5–5.3)
RBC: 4.76 M/uL (ref 4.22–5.81)
RDW: 13.6 % (ref 11.5–15.5)
Sed Rate: 27 mm/hr — ABNORMAL HIGH (ref 0–16)
Sodium: 140 meq/L (ref 135–145)
TSH: 0.59 microintl units/mL (ref 0.350–4.500)
Total Bilirubin: 0.3 mg/dL (ref 0.3–1.2)
Total CK: 130 units/L (ref 7–232)
Total Protein: 6.8 g/dL (ref 6.0–8.3)
Vitamin B-12: 389 pg/mL (ref 211–911)
WBC: 9.8 10*3/uL (ref 4.0–10.5)

## 2008-11-12 ENCOUNTER — Encounter (INDEPENDENT_AMBULATORY_CARE_PROVIDER_SITE_OTHER): Payer: Self-pay | Admitting: Internal Medicine

## 2008-11-14 ENCOUNTER — Encounter (INDEPENDENT_AMBULATORY_CARE_PROVIDER_SITE_OTHER): Payer: Self-pay | Admitting: Internal Medicine

## 2008-11-15 ENCOUNTER — Ambulatory Visit: Payer: Self-pay | Admitting: Physical Medicine & Rehabilitation

## 2008-11-23 ENCOUNTER — Encounter: Admission: RE | Admit: 2008-11-23 | Discharge: 2008-11-23 | Payer: Self-pay | Admitting: Neurology

## 2008-12-06 ENCOUNTER — Encounter (INDEPENDENT_AMBULATORY_CARE_PROVIDER_SITE_OTHER): Payer: Self-pay | Admitting: Internal Medicine

## 2008-12-10 ENCOUNTER — Ambulatory Visit: Payer: Self-pay | Admitting: Internal Medicine

## 2008-12-10 DIAGNOSIS — R413 Other amnesia: Secondary | ICD-10-CM

## 2009-01-01 ENCOUNTER — Encounter
Admission: RE | Admit: 2009-01-01 | Discharge: 2009-04-01 | Payer: Self-pay | Admitting: Physical Medicine & Rehabilitation

## 2009-01-03 ENCOUNTER — Encounter (INDEPENDENT_AMBULATORY_CARE_PROVIDER_SITE_OTHER): Payer: Self-pay | Admitting: Internal Medicine

## 2009-01-03 ENCOUNTER — Ambulatory Visit: Payer: Self-pay | Admitting: Physical Medicine & Rehabilitation

## 2009-01-10 ENCOUNTER — Encounter (INDEPENDENT_AMBULATORY_CARE_PROVIDER_SITE_OTHER): Payer: Self-pay | Admitting: Internal Medicine

## 2009-01-23 ENCOUNTER — Encounter (INDEPENDENT_AMBULATORY_CARE_PROVIDER_SITE_OTHER): Payer: Self-pay | Admitting: Internal Medicine

## 2009-02-07 ENCOUNTER — Ambulatory Visit: Payer: Self-pay | Admitting: Physical Medicine & Rehabilitation

## 2009-03-18 ENCOUNTER — Ambulatory Visit: Payer: Self-pay | Admitting: Physical Medicine & Rehabilitation

## 2009-04-11 ENCOUNTER — Ambulatory Visit (HOSPITAL_BASED_OUTPATIENT_CLINIC_OR_DEPARTMENT_OTHER): Admission: RE | Admit: 2009-04-11 | Discharge: 2009-04-11 | Payer: Self-pay

## 2009-04-13 ENCOUNTER — Ambulatory Visit: Payer: Self-pay | Admitting: Internal Medicine

## 2009-05-10 ENCOUNTER — Encounter
Admission: RE | Admit: 2009-05-10 | Discharge: 2009-06-19 | Payer: Self-pay | Admitting: Physical Medicine & Rehabilitation

## 2009-05-13 ENCOUNTER — Ambulatory Visit: Payer: Self-pay | Admitting: Physical Medicine & Rehabilitation

## 2009-05-30 ENCOUNTER — Observation Stay (HOSPITAL_COMMUNITY): Admission: EM | Admit: 2009-05-30 | Discharge: 2009-05-31 | Payer: Self-pay | Admitting: Emergency Medicine

## 2009-06-19 ENCOUNTER — Ambulatory Visit: Payer: Self-pay | Admitting: Physical Medicine & Rehabilitation

## 2009-07-07 ENCOUNTER — Encounter: Payer: Self-pay | Admitting: Internal Medicine

## 2009-09-10 ENCOUNTER — Other Ambulatory Visit: Admission: RE | Admit: 2009-09-10 | Discharge: 2009-09-10 | Payer: Self-pay | Admitting: Interventional Radiology

## 2009-09-10 ENCOUNTER — Encounter: Admission: RE | Admit: 2009-09-10 | Discharge: 2009-09-10 | Payer: Self-pay | Admitting: Family Medicine

## 2009-09-19 ENCOUNTER — Ambulatory Visit: Payer: Self-pay | Admitting: *Deleted

## 2009-12-30 ENCOUNTER — Emergency Department (HOSPITAL_COMMUNITY): Admission: EM | Admit: 2009-12-30 | Discharge: 2009-12-30 | Payer: Self-pay | Admitting: Emergency Medicine

## 2010-05-18 ENCOUNTER — Encounter: Payer: Self-pay | Admitting: Internal Medicine

## 2010-05-19 ENCOUNTER — Encounter: Payer: Self-pay | Admitting: Internal Medicine

## 2010-05-27 NOTE — Letter (Signed)
Summary: CMN for CPAP Supplies/[facility]  CMN for CPAP Supplies/[facility]   Imported By: Sherian Rein 07/10/2009 08:04:26  _____________________________________________________________________  External Attachment:    Type:   Image     Comment:   External Document

## 2010-06-03 ENCOUNTER — Ambulatory Visit: Payer: Self-pay | Admitting: Physical Medicine & Rehabilitation

## 2010-06-13 ENCOUNTER — Encounter: Payer: MEDICARE | Attending: Physical Medicine & Rehabilitation

## 2010-06-13 ENCOUNTER — Ambulatory Visit (HOSPITAL_BASED_OUTPATIENT_CLINIC_OR_DEPARTMENT_OTHER): Payer: MEDICARE | Admitting: Physical Medicine & Rehabilitation

## 2010-06-13 DIAGNOSIS — M4716 Other spondylosis with myelopathy, lumbar region: Secondary | ICD-10-CM | POA: Insufficient documentation

## 2010-06-13 DIAGNOSIS — R0602 Shortness of breath: Secondary | ICD-10-CM | POA: Insufficient documentation

## 2010-06-13 DIAGNOSIS — N289 Disorder of kidney and ureter, unspecified: Secondary | ICD-10-CM | POA: Insufficient documentation

## 2010-06-13 DIAGNOSIS — E1142 Type 2 diabetes mellitus with diabetic polyneuropathy: Secondary | ICD-10-CM | POA: Insufficient documentation

## 2010-06-13 DIAGNOSIS — F3289 Other specified depressive episodes: Secondary | ICD-10-CM | POA: Insufficient documentation

## 2010-06-13 DIAGNOSIS — M5137 Other intervertebral disc degeneration, lumbosacral region: Secondary | ICD-10-CM | POA: Insufficient documentation

## 2010-06-13 DIAGNOSIS — F17200 Nicotine dependence, unspecified, uncomplicated: Secondary | ICD-10-CM | POA: Insufficient documentation

## 2010-06-13 DIAGNOSIS — M961 Postlaminectomy syndrome, not elsewhere classified: Secondary | ICD-10-CM | POA: Insufficient documentation

## 2010-06-13 DIAGNOSIS — M51379 Other intervertebral disc degeneration, lumbosacral region without mention of lumbar back pain or lower extremity pain: Secondary | ICD-10-CM | POA: Insufficient documentation

## 2010-06-13 DIAGNOSIS — F329 Major depressive disorder, single episode, unspecified: Secondary | ICD-10-CM | POA: Insufficient documentation

## 2010-06-13 DIAGNOSIS — E1149 Type 2 diabetes mellitus with other diabetic neurological complication: Secondary | ICD-10-CM | POA: Insufficient documentation

## 2010-06-13 DIAGNOSIS — M47817 Spondylosis without myelopathy or radiculopathy, lumbosacral region: Secondary | ICD-10-CM

## 2010-06-13 DIAGNOSIS — M129 Arthropathy, unspecified: Secondary | ICD-10-CM | POA: Insufficient documentation

## 2010-06-13 DIAGNOSIS — I10 Essential (primary) hypertension: Secondary | ICD-10-CM | POA: Insufficient documentation

## 2010-07-05 ENCOUNTER — Emergency Department (HOSPITAL_COMMUNITY): Payer: MEDICARE

## 2010-07-05 ENCOUNTER — Inpatient Hospital Stay (HOSPITAL_COMMUNITY)
Admission: EM | Admit: 2010-07-05 | Discharge: 2010-07-10 | DRG: 637 | Disposition: A | Discharge status: Home / Self Care | Payer: MEDICARE | Attending: Internal Medicine | Admitting: Internal Medicine

## 2010-07-05 DIAGNOSIS — E876 Hypokalemia: Secondary | ICD-10-CM | POA: Diagnosis not present

## 2010-07-05 DIAGNOSIS — E785 Hyperlipidemia, unspecified: Secondary | ICD-10-CM | POA: Diagnosis present

## 2010-07-05 DIAGNOSIS — I2589 Other forms of chronic ischemic heart disease: Secondary | ICD-10-CM | POA: Diagnosis present

## 2010-07-05 DIAGNOSIS — E1310 Other specified diabetes mellitus with ketoacidosis without coma: Principal | ICD-10-CM | POA: Diagnosis present

## 2010-07-05 DIAGNOSIS — Z794 Long term (current) use of insulin: Secondary | ICD-10-CM

## 2010-07-05 DIAGNOSIS — N183 Chronic kidney disease, stage 3 unspecified: Secondary | ICD-10-CM | POA: Diagnosis present

## 2010-07-05 DIAGNOSIS — F3289 Other specified depressive episodes: Secondary | ICD-10-CM | POA: Diagnosis present

## 2010-07-05 DIAGNOSIS — I129 Hypertensive chronic kidney disease with stage 1 through stage 4 chronic kidney disease, or unspecified chronic kidney disease: Secondary | ICD-10-CM | POA: Diagnosis present

## 2010-07-05 DIAGNOSIS — G609 Hereditary and idiopathic neuropathy, unspecified: Secondary | ICD-10-CM | POA: Diagnosis present

## 2010-07-05 DIAGNOSIS — K5289 Other specified noninfective gastroenteritis and colitis: Secondary | ICD-10-CM | POA: Diagnosis present

## 2010-07-05 DIAGNOSIS — K226 Gastro-esophageal laceration-hemorrhage syndrome: Secondary | ICD-10-CM | POA: Diagnosis present

## 2010-07-05 DIAGNOSIS — G4733 Obstructive sleep apnea (adult) (pediatric): Secondary | ICD-10-CM | POA: Diagnosis present

## 2010-07-05 DIAGNOSIS — K2100 Gastro-esophageal reflux disease with esophagitis, without bleeding: Secondary | ICD-10-CM | POA: Diagnosis present

## 2010-07-05 DIAGNOSIS — N289 Disorder of kidney and ureter, unspecified: Secondary | ICD-10-CM | POA: Diagnosis present

## 2010-07-05 DIAGNOSIS — F329 Major depressive disorder, single episode, unspecified: Secondary | ICD-10-CM | POA: Diagnosis present

## 2010-07-05 LAB — PHOSPHORUS: Phosphorus: 2.5 mg/dL (ref 2.3–4.6)

## 2010-07-05 LAB — LIPASE, BLOOD: Lipase: 27 U/L (ref 11–59)

## 2010-07-05 LAB — BRAIN NATRIURETIC PEPTIDE: Pro B Natriuretic peptide (BNP): 1200 pg/mL — ABNORMAL HIGH (ref 0.0–100.0)

## 2010-07-05 LAB — HEPATIC FUNCTION PANEL
AST: 24 U/L (ref 0–37)
Albumin: 3.5 g/dL (ref 3.5–5.2)
Total Bilirubin: 0.6 mg/dL (ref 0.3–1.2)
Total Protein: 7 g/dL (ref 6.0–8.3)

## 2010-07-05 LAB — BASIC METABOLIC PANEL
BUN: 35 mg/dL — ABNORMAL HIGH (ref 6–23)
CO2: 27 mEq/L (ref 19–32)
Calcium: 9.6 mg/dL (ref 8.4–10.5)
Calcium: 9.2 mg/dL (ref 8.4–10.5)
Creatinine, Ser: 2.07 mg/dL — ABNORMAL HIGH (ref 0.4–1.5)
GFR calc Af Amer: 40 mL/min — ABNORMAL LOW (ref >60)
GFR calc non Af Amer: 29 mL/min — ABNORMAL LOW (ref >60)
GFR calc non Af Amer: 33 mL/min — ABNORMAL LOW (ref >60)
Potassium: 3.9 mEq/L (ref 3.5–5.1)
Sodium: 135 mEq/L (ref 135–145)
Sodium: 140 mEq/L (ref 135–145)

## 2010-07-05 LAB — CBC
HCT: 39.2 % (ref 39.0–52.0)
MCV: 83.8 fL (ref 78.0–100.0)
Platelets: 231 10*3/uL (ref 150–400)
RBC: 4.68 MIL/uL (ref 4.22–5.81)
WBC: 13.4 10*3/uL — ABNORMAL HIGH (ref 4.0–10.5)

## 2010-07-05 LAB — DIFFERENTIAL
Basophils Absolute: 0 10*3/uL (ref 0.0–0.1)
Lymphocytes Relative: 6 % — ABNORMAL LOW (ref 12–46)
Lymphs Abs: 0.8 10*3/uL (ref 0.7–4.0)
Neutro Abs: 11.9 10*3/uL — ABNORMAL HIGH (ref 1.7–7.7)
Neutrophils Relative %: 89 % — ABNORMAL HIGH (ref 43–77)

## 2010-07-05 LAB — POCT CARDIAC MARKERS
CKMB, poc: 5 ng/mL (ref 1.0–8.0)
Myoglobin, poc: >500 ng/mL (ref 12–200)
Troponin i, poc: <0.05 ng/mL (ref 0.00–0.09)

## 2010-07-05 LAB — LACTIC ACID, PLASMA: Lactic Acid, Venous: 2.1 mmol/L (ref 0.5–2.2)

## 2010-07-05 LAB — MRSA PCR SCREENING: MRSA by PCR: NEGATIVE

## 2010-07-05 LAB — MAGNESIUM: Magnesium: 2.2 mg/dL (ref 1.5–2.5)

## 2010-07-05 LAB — GLUCOSE, CAPILLARY
Glucose-Capillary: 324 mg/dL — ABNORMAL HIGH (ref 70–99)
Glucose-Capillary: 279 mg/dL — ABNORMAL HIGH (ref 70–99)

## 2010-07-06 LAB — URINE MICROSCOPIC-ADD ON

## 2010-07-06 LAB — CBC
HCT: 36.8 % — ABNORMAL LOW (ref 39.0–52.0)
Hemoglobin: 12.6 g/dL — ABNORMAL LOW (ref 13.0–17.0)
MCV: 84.6 fL (ref 78.0–100.0)
Platelets: 231 10*3/uL (ref 150–400)
RBC: 4.35 MIL/uL (ref 4.22–5.81)
WBC: 17.9 10*3/uL — ABNORMAL HIGH (ref 4.0–10.5)

## 2010-07-06 LAB — GLUCOSE, CAPILLARY
Glucose-Capillary: 148 mg/dL — ABNORMAL HIGH (ref 70–99)
Glucose-Capillary: 136 mg/dL — ABNORMAL HIGH (ref 70–99)
Glucose-Capillary: 151 mg/dL — ABNORMAL HIGH (ref 70–99)
Glucose-Capillary: 124 mg/dL — ABNORMAL HIGH (ref 70–99)

## 2010-07-06 LAB — BASIC METABOLIC PANEL
BUN: 35 mg/dL — ABNORMAL HIGH (ref 6–23)
CO2: 28 mEq/L (ref 19–32)
CO2: 29 mEq/L (ref 19–32)
CO2: 30 mEq/L (ref 19–32)
Calcium: 8.8 mg/dL (ref 8.4–10.5)
Calcium: 8.9 mg/dL (ref 8.4–10.5)
Chloride: 100 mEq/L (ref 96–112)
Chloride: 102 mEq/L (ref 96–112)
Creatinine, Ser: 2.1 mg/dL — ABNORMAL HIGH (ref 0.4–1.5)
GFR calc Af Amer: 40 mL/min — ABNORMAL LOW (ref >60)
GFR calc Af Amer: 40 mL/min — ABNORMAL LOW (ref >60)
GFR calc non Af Amer: 33 mL/min — ABNORMAL LOW (ref >60)
Glucose, Bld: 138 mg/dL — ABNORMAL HIGH (ref 70–99)
Potassium: 3.6 mEq/L (ref 3.5–5.1)
Potassium: 3.8 mEq/L (ref 3.5–5.1)
Sodium: 138 mEq/L (ref 135–145)

## 2010-07-06 LAB — URINALYSIS, ROUTINE W REFLEX MICROSCOPIC
Glucose, UA: NEGATIVE mg/dL
Leukocytes, UA: NEGATIVE
pH: 5.5 (ref 5.0–8.0)

## 2010-07-06 LAB — DIFFERENTIAL
Basophils Relative: 0 % (ref 0–1)
Lymphs Abs: 1.8 10*3/uL (ref 0.7–4.0)
Monocytes Relative: 10 % (ref 3–12)
Neutro Abs: 14.4 10*3/uL — ABNORMAL HIGH (ref 1.7–7.7)
Neutrophils Relative %: 80 % — ABNORMAL HIGH (ref 43–77)

## 2010-07-06 LAB — CARDIAC PANEL(CRET KIN+CKTOT+MB+TROPI): Total CK: 353 U/L — ABNORMAL HIGH (ref 7–232)

## 2010-07-07 ENCOUNTER — Inpatient Hospital Stay (HOSPITAL_COMMUNITY): Payer: MEDICARE

## 2010-07-07 ENCOUNTER — Encounter (HOSPITAL_COMMUNITY): Payer: Self-pay | Admitting: Radiology

## 2010-07-07 DIAGNOSIS — K2100 Gastro-esophageal reflux disease with esophagitis, without bleeding: Secondary | ICD-10-CM

## 2010-07-07 DIAGNOSIS — K920 Hematemesis: Secondary | ICD-10-CM

## 2010-07-07 DIAGNOSIS — R112 Nausea with vomiting, unspecified: Secondary | ICD-10-CM

## 2010-07-07 HISTORY — DX: Gastro-esophageal reflux disease with esophagitis, without bleeding: K21.00

## 2010-07-07 LAB — BASIC METABOLIC PANEL
CO2: 27 mEq/L (ref 19–32)
Calcium: 8.7 mg/dL (ref 8.4–10.5)
Glucose, Bld: 84 mg/dL (ref 70–99)
Sodium: 140 mEq/L (ref 135–145)

## 2010-07-07 LAB — DIFFERENTIAL
Basophils Absolute: 0 10*3/uL (ref 0.0–0.1)
Lymphocytes Relative: 21 % (ref 12–46)
Monocytes Absolute: 1.1 10*3/uL — ABNORMAL HIGH (ref 0.1–1.0)
Monocytes Relative: 9 % (ref 3–12)
Neutro Abs: 8.9 10*3/uL — ABNORMAL HIGH (ref 1.7–7.7)

## 2010-07-07 LAB — CBC
HCT: 38.1 % — ABNORMAL LOW (ref 39.0–52.0)
Hemoglobin: 12.2 g/dL — ABNORMAL LOW (ref 13.0–17.0)
MCH: 27.5 pg (ref 26.0–34.0)
MCHC: 32 g/dL (ref 30.0–36.0)

## 2010-07-07 LAB — GLUCOSE, CAPILLARY
Glucose-Capillary: 121 mg/dL — ABNORMAL HIGH (ref 70–99)
Glucose-Capillary: 133 mg/dL — ABNORMAL HIGH (ref 70–99)

## 2010-07-07 LAB — CARDIAC PANEL(CRET KIN+CKTOT+MB+TROPI)
Relative Index: 1.2 (ref 0.0–2.5)
Troponin I: 0.06 ng/mL (ref 0.00–0.06)

## 2010-07-07 LAB — T4, FREE: Free T4: 1.44 ng/dL (ref 0.80–1.80)

## 2010-07-08 ENCOUNTER — Encounter: Payer: MEDICARE | Admitting: Internal Medicine

## 2010-07-08 HISTORY — PX: ESOPHAGOGASTRODUODENOSCOPY: SHX1529

## 2010-07-08 LAB — BASIC METABOLIC PANEL
BUN: 31 mg/dL — ABNORMAL HIGH (ref 6–23)
Chloride: 102 mEq/L (ref 96–112)
Glucose, Bld: 77 mg/dL (ref 70–99)
Potassium: 3.4 mEq/L — ABNORMAL LOW (ref 3.5–5.1)
Sodium: 140 mEq/L (ref 135–145)

## 2010-07-08 LAB — DIFFERENTIAL
Basophils Absolute: 0.1 10*3/uL (ref 0.0–0.1)
Lymphocytes Relative: 25 % (ref 12–46)
Lymphs Abs: 2.5 10*3/uL (ref 0.7–4.0)
Neutro Abs: 6.2 10*3/uL (ref 1.7–7.7)
Neutrophils Relative %: 63 % (ref 43–77)

## 2010-07-08 LAB — GLUCOSE, CAPILLARY
Glucose-Capillary: 114 mg/dL — ABNORMAL HIGH (ref 70–99)
Glucose-Capillary: 96 mg/dL (ref 70–99)

## 2010-07-08 LAB — CBC
HCT: 39.1 % (ref 39.0–52.0)
MCV: 85.6 fL (ref 78.0–100.0)
RBC: 4.57 MIL/uL (ref 4.22–5.81)
WBC: 9.9 10*3/uL (ref 4.0–10.5)

## 2010-07-08 LAB — HEMOCCULT GUIAC POC 1CARD (OFFICE)
Fecal Occult Bld: POSITIVE
Fecal Occult Bld: POSITIVE

## 2010-07-08 LAB — MAGNESIUM: Magnesium: 2.1 mg/dL (ref 1.5–2.5)

## 2010-07-09 DIAGNOSIS — K210 Gastro-esophageal reflux disease with esophagitis: Secondary | ICD-10-CM

## 2010-07-09 DIAGNOSIS — K226 Gastro-esophageal laceration-hemorrhage syndrome: Secondary | ICD-10-CM

## 2010-07-09 LAB — BASIC METABOLIC PANEL
CO2: 29 mEq/L (ref 19–32)
Calcium: 8.8 mg/dL (ref 8.4–10.5)
Chloride: 103 mEq/L (ref 96–112)
GFR calc Af Amer: 41 mL/min — ABNORMAL LOW (ref >60)
Glucose, Bld: 78 mg/dL (ref 70–99)
Sodium: 141 mEq/L (ref 135–145)

## 2010-07-10 ENCOUNTER — Ambulatory Visit: Payer: MEDICARE | Admitting: Physical Medicine & Rehabilitation

## 2010-07-10 DIAGNOSIS — K920 Hematemesis: Secondary | ICD-10-CM

## 2010-07-10 DIAGNOSIS — K208 Other esophagitis: Secondary | ICD-10-CM

## 2010-07-10 DIAGNOSIS — R197 Diarrhea, unspecified: Secondary | ICD-10-CM

## 2010-07-10 DIAGNOSIS — K226 Gastro-esophageal laceration-hemorrhage syndrome: Secondary | ICD-10-CM

## 2010-07-10 LAB — GLUCOSE, CAPILLARY
Glucose-Capillary: 126 mg/dL — ABNORMAL HIGH (ref 70–99)
Glucose-Capillary: 116 mg/dL — ABNORMAL HIGH (ref 70–99)
Glucose-Capillary: 227 mg/dL — ABNORMAL HIGH (ref 70–99)
Glucose-Capillary: 232 mg/dL — ABNORMAL HIGH (ref 70–99)

## 2010-07-10 LAB — BASIC METABOLIC PANEL
CO2: 26 mEq/L (ref 19–32)
Calcium: 8.7 mg/dL (ref 8.4–10.5)
Chloride: 105 mEq/L (ref 96–112)
GFR calc Af Amer: 40 mL/min — ABNORMAL LOW (ref >60)
Glucose, Bld: 240 mg/dL — ABNORMAL HIGH (ref 70–99)
Sodium: 140 mEq/L (ref 135–145)

## 2010-07-11 LAB — GLUCOSE, CAPILLARY
Glucose-Capillary: 205 mg/dL — ABNORMAL HIGH (ref 70–99)
Glucose-Capillary: 178 mg/dL — ABNORMAL HIGH (ref 70–99)

## 2010-07-13 NOTE — Op Note (Signed)
  NAME:  [name], [name]                ACCOUNT NO.:  [id number]  MEDICAL RECORD NO.:  [id number]           PATIENT TYPE:  I  LOCATION:  A302                          FACILITY:  [facility]  PHYSICIAN:  R. Roetta Sessions, M.D. DATE OF BIRTH:  [birth year]  DATE OF PROCEDURE:  07/08/2010 DATE OF DISCHARGE:                              OPERATIVE REPORT   INDICATIONS FOR PROCEDURE:  This is a 55 year old gentleman with a 5-day history of left upper quadrant abdominal pain, nausea, vomiting, and hematemesis, admitted with diabetic ketoacidosis.  Noncontrast CT demonstrates thickening of colon suggestive of colitis, but notably has had a paucity of diarrhea.  He has remained quite stable.  Hemoglobin remains normal at 13.0 this morning.  EGD is now being done.  Risks, benefits, limitations, alternatives, and imponderables have been discussed and questions answered.  Because of polypharmacy, he is being done under propofol in the OR.  PROCEDURE NOTE:  The patient was placed in the left lateral decubitus position.  Cetacaine spray for topical pharyngeal anesthesia.  Propofol sedation per Dr. Georgann Housekeeper associates.  FINDINGS:  Examination of the tubular esophagus revealed circumferential distal esophageal erosions with overlapping excoriations consistent with trauma retching.  There were no esophageal varices or other abnormality. There was no bleeding.  EG junction easily traversed.  Stomach:  Gastric cavity was emptied and insufflated well with air.  Thorough examination of gastric mucosa including retroflexed proximal stomach esophagogastric junction demonstrated only a 2-cm hiatal hernia.  Pylorus was patent, easily traversed.  Examination of the bulb and second portion revealed no abnormalities.  THERAPEUTIC/DIAGNOSTIC MANEUVERS PERFORMED:  None.  The patient tolerated the procedure well.  IMPRESSION: 1. Distal esophageal erosions and excoriations consistent with erosive     reflux  esophagitis with superimposed Mallory-Weiss trauma. 2. A 2-cm hiatal hernia, otherwise normal stomach, D1, D2.  Suspect trivial upper GI bleed secondary to reflux Mallory-Weiss tear.  RECOMMENDATIONS: 1. Continue Protonix 40 mg twice daily. 2. Resume Carafate suspension 1 mg a.c. and at bedtime. 3. Advance to 1800-calorie carbohydrate-modified diet.     Jonathon Bellows, M.D.     RMR/MEDQ  D:  07/08/2010  T:  07/08/2010  Job:  [id number]  Electronically Signed by Lorrin Goodell M.D. on 07/13/2010 09:12:57 AM

## 2010-07-13 NOTE — Consult Note (Signed)
NAME:  [name], [name]                ACCOUNT NO.:  [id number]  MEDICAL RECORD NO.:  [id number]           PATIENT TYPE:  I  LOCATION:  A302                          FACILITY:  [facility]  PHYSICIAN:  R. Roetta Sessions, M.D. DATE OF BIRTH:  Apr 16, 1956  DATE OF CONSULTATION: DATE OF DISCHARGE:                                CONSULTATION   REQUESTING PHYSICIAN:  [facility]ist, [facility].  REASON FOR CONSULTATION:  Nausea, vomiting x5 days.  HISTORY OF PRESENT ILLNESS:  [name] is a 55 year old obese Caucasian male, who was admitted with diabetic ketoacidosis.  He has had history of congestive heart failure, chronic kidney disease.  He has had intermittent nausea and vomiting for the last 4 days.  He tells me he had nausea, vomiting all night long yesterday.  He had one loose stool yesterday but otherwise he has had normal soft brown bowel movements daily.  He notes coffee-ground emesis 2 nights ago.  He complains of a constant "burning pain" in his left upper quadrant rates 5 out of 10 on pain scale.  He also complains of chronic back and neck pain.  He denies any rectal bleeding or melena.  He has had heartburn and indigestion for the last 5 days.  He tells me he is to be on Protonix 40 mg daily for about 10 years.  He quit about 5 years ago.  They felt that he did not needed.  He does admit taking Aleve twice a day and has been doing this for the last couple of weeks.  He has had some anorexia for the last 2 weeks.  Denies any dysphagia or odynophagia.  Denies any ill contacts. He is taking Aleve for arthritis.  He did have a CT scan of the abdomen and pelvis with IV contrast which shows a diffuse colitis.  PAST MEDICAL AND SURGICAL HISTORY:  Arthritis, obesity, tobacco abuse, depression, hyperlipidemia, sleep apnea, CHF, diabetic neuropathy, diabetes mellitus, coronary disease, hypertension, chronic kidney disease, mitral regurgitation.  He tells me had a colonoscopy  greater than 10 years ago in [place] and believes it was normal.  He has had an appendectomy.  He has had multiple neck and back surgeries, pilonidal cyst, bilateral hand surgeries.  MEDICATIONS:  Prior to admission: 1. Lasix 80 mg b.i.d.. 2. Fluoxetine 40 mg daily. 3. Nitroglycerin 0.4 mg p.r.n. 4. Oxycodone 15 mg p.r.n. 5. Crestor 40 mg daily. 6. Cozaar 100 mg daily. 7. Trazodone 100 mg at bedtime. 8. Baclofen 10 mg t.i.d. 9. Voltaren p.r.n. 10.Neurontin 400 mg t.i.d. 11.Spirolactone 25 mg daily. 12.Klonopin p.r.n. 13.Insulin daily.  ALLERGIES:  No known drug allergies.  FAMILY HISTORY:  There is no known family history of ulcer or carcinoma or chronic GI problems.  Father deceased at 46 secondary to CVA.  Mother deceased at 42 secondary to the same.  He lost one brother with complications from obesity.  He has one healthy brother.  SOCIAL HISTORY:  He is married.  His wife is currently in [facility] has she had knee surgery.  He has one healthy living daughter. Lost one daughter due to homicide.  He is disabled.  He has a 40 pack- year history of tobacco use.  Denies any alcohol or drug use.  REVIEW OF SYSTEMS:  See HPI otherwise negative review of systems.  He complaints of pounding heart rate and pain down his left arm for which I met Dr. Lendell Caprice and Theone Murdoch his nurse aware and he is being given nitroglycerin.  PHYSICAL EXAMINATION:  VITAL SIGNS:  Temperature 97.9, pulse 75, respiration 19, blood pressure 161/76. GENERAL:  He is an obese Caucasian male who is alert, oriented and cooperative, in no acute distress. HEENT:  Sclerae are clear, nonicteric.  Conjunctivae are pale. Oropharynx is pink and moist  His lips do have ecchymosis.  No active bleeding.  Posterior pharynx is clear. NECK:  Supple without mass or thyromegaly. CHEST:  Heart regular rate and rhythm.  Normal S1 and S2.  No murmurs, clicks, rubs, or gallops. LUNGS:  With expiratory wheezes  bilaterally.  No acute distress. ABDOMEN:  Positive bowel sounds x4.  No bruits auscultated.  Abdomen is soft, mildly distended, nontender without any palpable mass or hepatosplenomegaly.  No rash or guarding.  Exam is limited given the patient's body habitus. EXTREMITIES:  No clubbing.  He has trace lower extremity edema bilaterally.  LABORATORY DATA:  Laboratory studies; hemoglobin 12.2, hematocrit 38.1, platelets 209, white blood cell count 12.7.  Calcium 8.7, potassium 3.2, chloride 102, CO2 of 27, BUN 33, creatinine 2.03, and glucose 84.  LFTs were normal on 03/10.  BMP is 342 and TSH is 0.316.  His most recent cardiac workups were negative.  IMPRESSION:  [name] is a 55 year old obese Caucasian male with history of congestive heart failure, chronic kidney disease, diabetes mellitus admitted with diabetic ketoacidosis.  He has had intractable nausea, vomiting times 5 days along with hematemesis, heartburn, and indigestion.  He is going to need EGD for further evaluation and two rule out peptic ulcer disease, Mallory-Weiss tear, it is possible his intractable nausea and vomiting is secondary to diabetic gastroparesis and or esophagitis.  It is complicated by the fact that he is on Voltaren and Aleve b.i.d.  He does have a picture of colitis.  This is nonspecific and IV contrast only setting.  He has no complaints of diarrhea.  PLAN: 1. EGD with Dr. Jena Gauss, discussed this procedure risks and benefits,     which include, but not limited to infection, perforation, drug     reaction.  He agrees to plan, consent will be obtained. 2. Continue supportive measures. 3. Agree with b.i.d. PPI. 4. Will need an outpatient screening colonoscopy. 5. Procedures to be done in the OR given history of polypharmacy.     Lorenza Burton, N.P.   ______________________________ R. Roetta Sessions, M.D.    KJ/MEDQ  D:  07/07/2010  T:  07/07/2010  Job:  [id number]  Electronically Signed by Lorenza Burton N.P. on 07/10/2010 08:26:39 AM Electronically Signed by Lorrin Goodell M.D. on 07/13/2010 09:12:51 AM

## 2010-07-14 NOTE — Discharge Summary (Signed)
NAME:  [name], [name]                ACCOUNT NO.:  [id number]  MEDICAL RECORD NO.:  [id number]           PATIENT TYPE:  I  LOCATION:  A302                          FACILITY:  [facility]  PHYSICIAN:  Elliot Cousin, M.D.    DATE OF BIRTH:  1955/09/16  DATE OF ADMISSION:  07/05/2010 DATE OF DISCHARGE:  03/15/2012LH                              DISCHARGE SUMMARY   DISCHARGE DIAGNOSES: 1. Nausea, vomiting, and hematemesis. 2. Distal esophageal erosions and excoriations consistent with erosive     reflux esophagitis with superimposed Mallory-Weiss tear per EGD by     Dr. Roetta Sessions on July 08, 2010. 3. Diabetic ketoacidosis in a patient with insulin requiring type 2     diabetes mellitus. 4. Chronic kidney disease, stage III with a baseline     creatinine of 2.25.  The patient's creatinine at the time of     discharge was 2.11. 5. Cardiomyopathy with an ejection fraction of 40-45% per 2-D     echocardiogram on July 07, 2010.  The echo also revealed grade 1     diastolic dysfunction.  The patient is followed by cardiologist,     Dr. [place] Hilty.  No evidence of decompensation during the     hospitalization. 6. Mildly elevated troponin I.  The patient's initial troponin I was     0.24 and normalized to 0.06.  The elevation was thought to be     secondary to diabetic ketoacidosis. 7. Low TSH of 0.316, but normal free T4 of 1.44, and normal free T3 of     2.7. 8. Elevated BNP thought to be secondary to renal insufficiency and     diabetic ketoacidosis.  No congestive heart failure seen on the     chest x-ray. 9. Transient hypokalemia, resolved with repletion. 10.Hypertension, well-controlled.  Cozaar was decreased from 100 mg to     50 mg daily due to chronic kidney disease.  Norvasc was added at     2.5 mg daily. 11.Colitis. 12.History of obstructive sleep apnea, hyperlipidemia, depression, and     peripheral neuropathy.  DISCHARGE MEDICATIONS: 1. Amlodipine 2.5 mg daily (new  medication). 2. Cipro 250 mg b.i.d. for seven more days. 3. Flagyl 500 mg three times daily for seven more days. 4. Protonix 40 mg b.i.d. (new medication). 5. Sucralfate 1 gram per 10 mL one gram before each meals and at     bedtime until completion (new medication). 6. Cozaar 100 mg half a tablet daily. 7. Baclofen 10 mg three times daily. 8. Crestor 40 mg daily. 9. Docusate 100 mg 2 capsules b.i.d. as needed for constipation. 10.Furosemide 40 mg b.i.d. 11.Klonopin 0.5 mg 2 tablets at bedtime. 12.Mucinex 600 mg b.i.d. as needed for congestion. 13.Multivitamin 1 tablet daily. 14.Novolin 70/30, 55 units in the morning and 100 units in the     evening. 15.Neurontin 400 mg 1 capsule three times daily. 16.Nitroglycerin sublingual 0.4 mg 1 tablet under the tongue every 5     minutes up to three doses as needed. 17.Oxycodone 30 mg 1 tablet in the morning and evening and half a  tablet at noon and at bedtime. 18.Prozac 40 mg daily. 19.ReQuip 4 mg at bedtime. 20.Spironolactone 25 mg daily. 21.Toprol-XL 25 mg half a tablet daily. 22.Trazodone 100 mg daily at bedtime as needed for sleep. 23.Stop Voltaren gel. 24.Stop Aleve. 25.Stop glucosamine sulfate.  DISCHARGE DISPOSITION:  The patient was discharged to home in improved and stable condition on July 10, 2010.  He will follow up with Dr. Jena Gauss per Dr. Luvenia Starch schedule and recommendation.  He will follow up with Dr. Red Christians as previously recommended.  He will follow up with his primary care physician in 1 week.  CONSULTATION:  R. Roetta Sessions, MD  PROCEDURE PERFORMED: 1. EGD on July 08, 2010.  The results were dictated above.  In     addition, there was a 2-cm hiatal hernia. 2. CT scan of the abdomen and pelvis without contrast on July 07, 2010.  The results revealed diffuse colitis.  Negative for abscess     or bowel obstruction.  HISTORY OF PRESENT ILLNESS:  The patient is a 55 year old man with a past medical  history significant for cardiomyopathy, depression, hyperlipidemia, obstructive sleep apnea, type 2 diabetes mellitus, and chronic kidney disease.  He presented to the emergency department on July 05, 2010, with a chief complaint of nausea and vomiting.  His emesis was blood tinged.  In the emergency department, the patient was noted to be afebrile and hemodynamically stable.  His EKG revealed normal sinus rhythm, left axis deviation, and a heart rate of 71 beats per minute.  His lab data were significant for a WBC of 13.4, glucose of 535, BUN of 35, creatinine of 2.31, BNP of 1200, and a lactic acid of 2.1.  His chest x-ray revealed no acute cardiopulmonary disease.  He was admitted for further evaluation and management.  HOSPITAL COURSE: 1. DIABETIC KETOACIDOSIS.  The patient was diagnosed with diabetic     ketoacidosis based on his elevated anion gap and elevated blood     glucose.  He was started on the insulin drip protocol via the     Glu[employer]ander.  Because of his elevated BNP and dyspnea, he was     given gentle IV fluids rather than aggressive IV fluids.  His     capillary blood glucose and electrolytes were monitored closely.     When his blood glucose improved, the insulin drip was discontinued     in favor of 70/30 insulin and sliding scale NovoLog.  His     hemoglobin A1c was noted to be 9.9.  Throughout the remainder of     the hospitalization, his capillary blood glucose was fairly well     controlled.  The patient was advised to restart 70/30 insulin as     previously prescribed. 2. EROSIVE ESOPHAGITIS, MALLORY-WEISS TEAR, AND COLITIS.  The patient     complained of abdominal pain, nausea, and vomiting.  He had several     episodes of blood-tinged emesis.  He was started on gentle IV     fluids and IV Protonix.  Gastroenterologist, Dr. Jena Gauss, was     consulted.  Following his evaluation, he proceeded with an EGD.     The EGD revealed erosive esophagitis and a      superimposed Mallory-Weiss tear.  It also revealed a 2-cm hiatal     hernia, otherwise the stomach was normal and the duodenum was     normal.  Following these findings, the patient was started on  Carafate suspension.  Protonix was transitioned to the oral     preparation b.i.d.  Because of his pain, a CT scan of his abdomen     and pelvis without contrast was ordered.  The results were     consistent with colitis.  He was therefore started on Cipro and     Flagyl empirically.  His diet, which had been initially clear     liquids, was advanced slowly.  Over the course of the     hospitalization, his symptoms subsided.  He was eating without     pain, nausea, or vomiting prior to hospital discharge.  He was     continued on Protonix b.i.d. (and Carafate for two more weeks).     Dr. Jena Gauss plans to follow up with     the patient as an outpatient for an elective colonoscopy,     which will be arranged later.  The patient did receive 3-1/2 days     of Cipro and Flagyl in the hospital.  He was discharged to home on     seven more days of therapy. 3. HISTORY OF CARDIOMYOPATHY.  The patient has a history of     cardiomyopathy.  Although his BNP was elevated, there was no sign     of congestive heart failure radiographically or clinically.  Due to mild volume depletion, spironolactone was withheld.  However, Lasix     was continued.  A 2-D echocardiogram was ordered for further     evaluation and it revealed moderate hypokinesis of the entire     inferior myocardium, an ejection fraction of 40-45%, and diastolic     dysfunction (grade 1). 4. STAGE III TO STAGE IV CHRONIC KIDNEY DISEASE.  The patient's BUN     was 35 and his creatinine was 2.31 on admission.  At the time of     discharge, his BUN was 25 and his creatinine was 2.11.  Cozaar was     withheld during the hospital course.  Spironolactone was withheld     as well.  He was gently hydrated as stated     above.  Prior to hospital  discharge, the patient was instructed to     take 50 mg of Cozaar rather than 100 mg of Cozaar secondary to his     chronic kidney disease.  He was advised to restart spironolactone     and to continue Lasix as previously prescribed.  The patient will     need to have his renal function reassessed in the next week or 2. 5. HYPERTENSION.  The patient's blood pressure was initially within     normal limits.  It did increase some off Cozaar.  Norvasc was added     at 2.5 mg daily.  Toprol was increased to 25 mg daily rather than     12.5 mg daily.  However, at the time of hospital discharge, the     patient was advised to restart Cozaar, but at half the dose.  He     was instructed to resume Toprol-XL at 12.5 mg daily.  As stated     above, Norvasc was added at 2.5 mg daily.  Further management will     be deferred to his primary care physician and cardiologist. 6. ABNORMAL TSH.  The patient's thyroid function was assessed.  His     TSH was low; however, his free T4 and free T3 were within normal     limits.  Elliot Cousin, M.D.     DF/MEDQ  D:  07/10/2010  T:  07/10/2010  Job:  [id number]  cc:   R. Roetta Sessions, M.D. [address] [place] [address]  [place] Hilty, MD  Electronically Signed by Elliot Cousin M.D. on 07/14/2010 09:45:12 AM

## 2010-07-16 LAB — URINALYSIS, ROUTINE W REFLEX MICROSCOPIC
Bilirubin Urine: NEGATIVE
Leukocytes, UA: NEGATIVE
Nitrite: NEGATIVE
Specific Gravity, Urine: 1.011 (ref 1.005–1.030)
pH: 6.5 (ref 5.0–8.0)

## 2010-07-16 LAB — COMPREHENSIVE METABOLIC PANEL
AST: 19 U/L (ref 0–37)
Albumin: 3.6 g/dL (ref 3.5–5.2)
CO2: 23 mEq/L (ref 19–32)
Calcium: 8.8 mg/dL (ref 8.4–10.5)
Creatinine, Ser: 2.17 mg/dL — ABNORMAL HIGH (ref 0.4–1.5)
GFR calc Af Amer: 39 mL/min — ABNORMAL LOW (ref >60)
GFR calc non Af Amer: 32 mL/min — ABNORMAL LOW (ref >60)

## 2010-07-16 LAB — GLUCOSE, CAPILLARY: Glucose-Capillary: 177 mg/dL — ABNORMAL HIGH (ref 70–99)

## 2010-07-16 LAB — LIPID PANEL
Cholesterol: 193 mg/dL (ref 0–200)
HDL: 23 mg/dL — ABNORMAL LOW (ref >39)
LDL Cholesterol: 121 mg/dL — ABNORMAL HIGH (ref 0–99)
Total CHOL/HDL Ratio: 8.4 RATIO
Triglycerides: 245 mg/dL — ABNORMAL HIGH (ref <150)

## 2010-07-16 LAB — DIFFERENTIAL
Basophils Absolute: 0.1 10*3/uL (ref 0.0–0.1)
Basophils Absolute: 0.1 10*3/uL (ref 0.0–0.1)
Eosinophils Absolute: 0.3 10*3/uL (ref 0.0–0.7)
Eosinophils Relative: 4 % (ref 0–5)
Lymphocytes Relative: 24 % (ref 12–46)
Lymphocytes Relative: 25 % (ref 12–46)
Lymphs Abs: 1.9 10*3/uL (ref 0.7–4.0)
Monocytes Absolute: 0.7 10*3/uL (ref 0.1–1.0)
Monocytes Relative: 5 % (ref 3–12)
Neutro Abs: 6 10*3/uL (ref 1.7–7.7)
Neutrophils Relative %: 67 % (ref 43–77)

## 2010-07-16 LAB — CBC
HCT: 36.7 % — ABNORMAL LOW (ref 39.0–52.0)
Hemoglobin: 12.4 g/dL — ABNORMAL LOW (ref 13.0–17.0)
MCHC: 33.8 g/dL (ref 30.0–36.0)
MCV: 86.8 fL (ref 78.0–100.0)
MCV: 87.8 fL (ref 78.0–100.0)
Platelets: 192 10*3/uL (ref 150–400)
Platelets: ADEQUATE 10*3/uL (ref 150–400)
RDW: 13.5 % (ref 11.5–15.5)

## 2010-07-16 LAB — BASIC METABOLIC PANEL
BUN: 31 mg/dL — ABNORMAL HIGH (ref 6–23)
Chloride: 104 mEq/L (ref 96–112)
Glucose, Bld: 183 mg/dL — ABNORMAL HIGH (ref 70–99)
Potassium: 3.7 mEq/L (ref 3.5–5.1)
Sodium: 139 mEq/L (ref 135–145)

## 2010-07-16 LAB — POCT CARDIAC MARKERS

## 2010-07-16 LAB — URINE MICROSCOPIC-ADD ON

## 2010-07-24 ENCOUNTER — Ambulatory Visit (INDEPENDENT_AMBULATORY_CARE_PROVIDER_SITE_OTHER): Payer: MEDICARE | Admitting: Gastroenterology

## 2010-07-24 ENCOUNTER — Telehealth: Payer: Self-pay

## 2010-07-24 ENCOUNTER — Encounter: Payer: Self-pay | Admitting: Gastroenterology

## 2010-07-24 DIAGNOSIS — K5289 Other specified noninfective gastroenteritis and colitis: Secondary | ICD-10-CM

## 2010-07-24 DIAGNOSIS — K529 Noninfective gastroenteritis and colitis, unspecified: Secondary | ICD-10-CM

## 2010-07-24 DIAGNOSIS — R195 Other fecal abnormalities: Secondary | ICD-10-CM

## 2010-07-24 MED ORDER — PROMETHAZINE HCL 25 MG RE SUPP: 25.0000 mg | Freq: Four times a day (QID) | RECTAL | Status: DC | PRN

## 2010-07-24 MED ORDER — ALIGN 4 MG PO CAPS: 4.0000 mg | ORAL_CAPSULE | Freq: Every day | ORAL | Status: AC

## 2010-07-24 NOTE — Assessment & Plan Note (Addendum)
See assessment for Hemoccult-positive stool. Will also Align one daily for the next 4 weeks.

## 2010-07-24 NOTE — Telephone Encounter (Addendum)
Pt's wife called and said he got very ill after he left the OV here this AM. [place] Spaniel he passed out on the way home. He is home, but having  A lot of nausea and cannot eat anything. She requested some medication for nausea and he uses [facility]. Per pt OK to use  Suppositories. Per Tana Coast, PA, told spouse that if the suppositories do not help, he should go to the ED.   As above, RX for phenergan supp sent to [facility] in [place]. To ED if cannot tolerate POs.

## 2010-07-24 NOTE — Progress Notes (Signed)
Primary Care Physician:  Cristie Hem, MD, MD  Chief Complaint  Patient presents with  . Pre-op Exam    to schedule a colonoscopy    HPI:  [name] is a 55 y.o. male here to schedule colonoscopy. He was seen during a recent hospitalization a couple of weeks ago. He presented that time for several day history of nausea and vomiting and diabetic ketoacidosis. He also had a couple of loose stools. CT scan of the abdomen and pelvis with IV contrast showed diffuse colitis. For coffee-ground emesis he underwent an EGG which revealed erosive reflux esophagitis, Mallory-Weiss tear, small hiatal hernia.  Overall he states he has been doing well since he was discharged. He has had only one episode of vomiting since then. He feels a little nauseated today. He's having a couple of soft to formed bowel movements daily. Denies melena, rectal bleeding. Denies fever or chills. He has chronic coughing and clear sputum. He states this sometimes makes him nauseated. Denies any heartburn or dysphagia. He complains of a lot of gas. He completed his Cipro and Flagyl which was given for colitis. He finished it 2 days ago.  Current Outpatient Prescriptions  Medication Sig Dispense Refill  . amLODipine (NORVASC) 2.5 MG tablet Take 1 tablet by mouth Three times a day. Until meds are gone      . baclofen (LIORESAL) 10 MG tablet Take 1 tablet by mouth 4 (four) times daily.      Marland Kitchen FLUoxetine (PROZAC) 40 MG capsule Take 1 capsule by mouth daily.      . furosemide (LASIX) 40 MG tablet Take 1 tablet by mouth Twice daily as needed.      . gabapentin (NEURONTIN) 400 MG capsule Take 1 tablet by mouth 3 (three) times daily with meals.      . insulin NPH-insulin regular (NOVOLIN 70/30) (70-30) 100 UNIT/ML injection Inject 55 Units into the skin daily with breakfast.        . insulin NPH-insulin regular (NOVOLIN 70/30) (70-30) 100 UNIT/ML injection Inject 100 Units into the skin daily with supper.        . losartan (COZAAR) 100  MG tablet Take 1 tablet by mouth daily.      . metoprolol succinate (TOPROL-XL) 25 MG 24 hr tablet Take 1 tablet by mouth daily.      Marland Kitchen oxycodone (ROXICODONE) 30 MG immediate release tablet Take 1 tablet by mouth 4 (four) times daily.      . pantoprazole (PROTONIX) 40 MG tablet Take 1 tablet by mouth daily.      Marland Kitchen rOPINIRole (REQUIP) 1 MG tablet Take 4 tablets by mouth At bedtime.      Marland Kitchen spironolactone (ALDACTONE) 25 MG tablet Take 1 tablet by mouth daily.      . traZODone (DESYREL) 100 MG tablet Take 1 tablet by mouth At bedtime.      . Probiotic Product (ALIGN) 4 MG CAPS Take 4 mg by mouth daily.  30 capsule  0  . simvastatin (ZOCOR) 40 MG tablet Take 0.5 tablets by mouth daily.        Allergies as of 07/24/2010  . (No Known Allergies)    Past Medical History  Diagnosis Date  . Diabetes mellitus   . Hypertension   . CHF (congestive heart failure)   . CRD (chronic renal disease)   . Arthritis   . Obesity   . Depression   . Sleep apnea   . Diabetic neuropathy   . CAD (coronary artery disease)   .  Mitral regurgitation   . Esophagitis, reflux 07/07/10    MW tear, erosive reflux esophagitis, 2cm hh    Past Surgical History  Procedure Date  . Back surgery     multiple  . Neck surgery   . Hand surgery     bilateral  . Colonoscopy before 2002    [place]  . Appendectomy     Family History  Problem Relation Age of Onset  . Stroke Father 4  . Stroke Mother 68  . Colon cancer Neg Hx   . Crohn's disease Neg Hx   . Cirrhosis Neg Hx   . Ulcerative colitis Neg Hx   . Stomach cancer Neg Hx     History   Social History  . Marital Status: Married    Spouse Name: N/A    Number of Children: 1  . Years of Education: N/A   Occupational History  . disabled    Social History Main Topics  . Smoking status: Current Everyday Smoker -- 1.0 packs/day for 40 years    Types: Cigarettes  . Smokeless tobacco: Never Used   Comment: ordered nicotine patches. start date is 27 July 2010  . Alcohol Use: No  . Drug Use: No  . Sexually Active: Yes     erectile dysfunction     ROS:   General: Negative for anorexia, weight loss, fever, chills, fatigue, weakness.  Eyes: Negative for vision changes.   ENT: Negative for hoarseness, difficulty swallowing , nasal congestion.  CV: Negative for chest pain, angina, palpitations, dyspnea on exertion, peripheral edema.   Respiratory: Negative for dyspnea at rest, dyspnea on exertion, see HPI.   GI: See history of present illness.  GU:  Negative for dysuria, hematuria, urinary incontinence, urinary frequency, nocturnal urination.   MS: Negative for joint pain, low back pain.   Derm: Negative for rash or itching.   Neuro: Negative for weakness, abnormal sensation, seizure, frequent headaches, memory loss, confusion.   Psych: Negative for anxiety, depression, suicidal ideation, hallucinations.   Endo: Negative for unusual weight change.   Heme: Negative for bruising or bleeding.  Allergy: Negative for rash or hives.   Physical Examination: BP 138/82  Pulse 64  Temp 98 F (36.7 C)  Ht 6' (1.829 m)  Wt 306 lb (138.801 kg)  BMI 41.50 kg/m2  General: Well-nourished, well-developed in no acute distress.  Head: Normocephalic, atraumatic.   Eyes: Conjunctiva pink, no icterus. Mouth: Oropharyngeal mucosa moist and pink , no lesions erythema or exudate. Neck: Supple without thyromegaly, masses, or lymphadenopathy.  Lungs: Clear to auscultation bilaterally.  Heart: Regular rate and rhythm, no murmurs rubs or gallops.  Abdomen: Bowel sounds are normal, nontender, nondistended, no hepatosplenomegaly or masses, no abdominal bruits or hernia , no rebound or guarding.   Extremities: No lower extremity edema.  Neuro: Alert and oriented x 3 , grossly normal neurologically.  Skin: Warm and dry, no rash or jaundice.   Psych: Alert and cooperative, normal mood and affect.

## 2010-07-24 NOTE — Assessment & Plan Note (Signed)
Patient with history of colitis and Hemoccult-positive stool during recent hospitalization. Recently finished Cipro and Flagyl. Denies any diarrhea. No overt GI bleeding. It has been over 10 years since his last colonoscopy therefore we are scheduling one today. I have discussed the risks, alternatives, benefits with regards to but not limited to the risk of reaction to medication, bleeding, infection, perforation and the patient is agreeable to proceed. Procedure will be done in the OR given his history of polypharmacy.

## 2010-07-27 DIAGNOSIS — Z9889 Other specified postprocedural states: Secondary | ICD-10-CM

## 2010-07-27 HISTORY — DX: Other specified postprocedural states: Z98.890

## 2010-08-04 LAB — GLUCOSE, CAPILLARY
Glucose-Capillary: 205 mg/dL — ABNORMAL HIGH (ref 70–99)
Glucose-Capillary: 343 mg/dL — ABNORMAL HIGH (ref 70–99)
Glucose-Capillary: 356 mg/dL — ABNORMAL HIGH (ref 70–99)
Glucose-Capillary: 357 mg/dL — ABNORMAL HIGH (ref 70–99)

## 2010-08-05 LAB — CBC
HCT: 41.6 % (ref 39.0–52.0)
Hemoglobin: 14 g/dL (ref 13.0–17.0)
MCHC: 33.6 g/dL (ref 30.0–36.0)
MCV: 85.8 fL (ref 78.0–100.0)
Platelets: 225 10*3/uL (ref 150–400)
RBC: 4.84 MIL/uL (ref 4.22–5.81)
RDW: 14.1 % (ref 11.5–15.5)
WBC: 10 10*3/uL (ref 4.0–10.5)

## 2010-08-05 LAB — BASIC METABOLIC PANEL
BUN: 27 mg/dL — ABNORMAL HIGH (ref 6–23)
CO2: 30 mEq/L (ref 19–32)
Calcium: 9.7 mg/dL (ref 8.4–10.5)
Chloride: 102 mEq/L (ref 96–112)
Creatinine, Ser: 2.2 mg/dL — ABNORMAL HIGH (ref 0.4–1.5)
GFR calc Af Amer: 38 mL/min — ABNORMAL LOW (ref >60)
GFR calc non Af Amer: 31 mL/min — ABNORMAL LOW (ref >60)
Glucose, Bld: 182 mg/dL — ABNORMAL HIGH (ref 70–99)
Potassium: 5 mEq/L (ref 3.5–5.1)
Sodium: 138 mEq/L (ref 135–145)

## 2010-08-11 ENCOUNTER — Other Ambulatory Visit (HOSPITAL_COMMUNITY): Payer: MEDICARE

## 2010-08-13 NOTE — H&P (Signed)
NAME:  [name], [name]                ACCOUNT NO.:  [id number]  MEDICAL RECORD NO.:  [id number]           PATIENT TYPE:  I  LOCATION:  IC03                          FACILITY:  [facility]  PHYSICIAN:  Derrich Gaby L. Lendell Caprice, MDDATE OF BIRTH:  June 15, 1955  DATE OF ADMISSION:  07/05/2010 DATE OF DISCHARGE:  LH                             HISTORY & PHYSICAL   CHIEF COMPLAINT:  Vomiting.  HISTORY OF PRESENT ILLNESS:  [name] is a 55 year old white male with multiple medical problems who presents with a 2-day history of vomiting. He has been able to keep anything down including his medications.  He has a history of diabetes and has not had his insulin for 2 days.  He has mainly had bilious emesis with more recently some tinges of blood. He denies any hematochezia.  He denies any melena.  He complains of some epigastric and chest pain.  His heart feels like it is pounding.  He also feels short of breath.  He has a history of heart failure with an ejection fraction reportedly of 35%.  I have no recent echocardiogram reports.  He is not feeling well, nauseated and is unable to provide much history other than per HPI.  He is also received Phenergan and Dilaudid and is slightly groggy.  PAST MEDICAL HISTORY: 1. Type 2 diabetes. 2. Hyperlipidemia. 3. Obstructive sleep apnea. 4. 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 5. Hypertension. 6. Depression. 7. Peripheral neuropathy. 8. Moderate mitral regurgitation. 9. A 70/30 insulin 55 units in the morning, 100 units in the evening.     The rest reportedly at per ER notes, although this has not been     verified.  SOCIAL HISTORY:  He smokes a pack of cigarettes a day.  He denies heavy drinking or drug use.  FAMILY HISTORY:  Reviewed and as per previous dictations.  REVIEW OF SYSTEMS:  Systems reviewed and as above otherwise negative.  PHYSICAL EXAMINATION:  VITAL SIGNS:  Temperature is 97.9, blood  pressure 136/96, pulse 85, respiratory rate 26, oxygen saturation 99% on room air. GENERAL:  The patient is an obese, uncomfortable-appearing white male. HEENT:  The pupils equal, round, and reactive to light.  Dry mucous membranes. NECK:  Supple.  No lymphadenopathy. LUNGS:  Clear to auscultation bilaterally without wheezes, rhonchi or rales. CARDIOVASCULAR:  Regular rate and rhythm without murmurs, gallops, or rubs. ABDOMEN:  Obese, soft, mild diffuse tenderness, but no guarding or rebound. GU and RECTAL:  Deferred. EXTREMITIES:  No clubbing, cyanosis or edema. SKIN:  No rash. PSYCHIATRIC:  Normal affect. NEUROLOGIC:  He is slightly groggy and is having difficulty answering some of my questions, but overall as oriented.  Cranial nerves and sensorimotor exam are grossly intact.  LABORATORY DATA:  CBC significant for white blood cell count of 13,000 with 89% neutrophils.  Sodium 135, potassium 3.9, chloride 94, bicarbonate 21, glucose 535, BUN 35, creatinine 2.31.  Anion gap is 20. Normal CPK-MB.  Normal troponin.  Myoglobin is greater than 500.  BNP is 1200.  EKG shows normal sinus rhythm, incomplete  left bundle branch block, nonspecific changes, prolonged QT.  Chest x-ray shows nothing acute.  ASSESSMENT AND PLAN: 1. Diabetic ketoacidosis based on his elevated anion gap and elevated     blood glucose as well as symptoms:  The patient has been started on     IV insulin.  Due to his elevated BNP and dyspnea, he may have mild     congestive heart failure exacerbation and I will limit his fluids     and continue his Lasix.  He will get serial basic metabolic panels.     I will check a hemoglobin A1c, liver function tests, lipase, serial     cardiac enzymes and urinalysis to look for precipitating     etiologies.  He will be on clear liquids for now.  I will also     check a serum acetone and lactic acid level. 2. Nausea, vomiting with burning epigastric pain and blood-tinged      emesis:  His blood pressure and hemoglobin are currently not low.     I will, however, check another H and H and monitor stools for     blood.  I will give Protonix and start with clear liquid diet for     now as well as supportive care and antiemetics.  Should he display     any significant hemorrhage, I will consult GI, but hold off for now     as he may have gastritis, esophagitis or Mallory-Weiss tear, which     may not require intervention.  He denies NSAID use. 3. Elevated BNP:  When the patient was last admitted for heart failure     exacerbation, his BNP will was minimally elevated.  Certainly, not     to the level that it is today.  I will try and get records from     [facility].  Continue Lasix.  Give oxygen,     rule out MI, check a TSH.  He may need a repeat echocardiogram as     he has not had one recently.  The patient is unable to tell me when     his last echo was done or the results. 4. Morbid obesity. 5. Hypertension. 6. Hyperlipidemia. 7. Obstructive sleep apnea.  Continue Z-Pak. 8. Tobacco abuse.  Counseled against.  He can have a nicotine patch. 9. Peripheral neuropathy. 10.Chronic kidney disease:  His creatinine is slightly worse than     previous.  I will hold his Cozaar and spironolactone for now.  His     home medications will need to be clarified.  His myoglobin is     elevated and I will check a total CPK as part of the cardiac     enzymes and hold his Crestor and other p.o. medications for now.    Jeffery Bachmeier L. Lendell Caprice, MD    CLS/MEDQ  D:  07/05/2010  T:  07/05/2010  Job:  [id number]  cc:   Italy Hilty, MD  Electronically Signed by Crista Curb MD on 08/13/2010 09:26:54 PM

## 2010-08-15 ENCOUNTER — Encounter (HOSPITAL_COMMUNITY): Payer: MEDICARE

## 2010-08-18 ENCOUNTER — Other Ambulatory Visit (HOSPITAL_COMMUNITY): Payer: MEDICARE

## 2010-08-19 ENCOUNTER — Encounter (HOSPITAL_COMMUNITY): Payer: MEDICARE

## 2010-08-19 ENCOUNTER — Other Ambulatory Visit: Payer: Self-pay | Admitting: Internal Medicine

## 2010-08-19 LAB — CBC
HCT: 36.4 % — ABNORMAL LOW (ref 39.0–52.0)
Hemoglobin: 12.2 g/dL — ABNORMAL LOW (ref 13.0–17.0)
MCH: 28.9 pg (ref 26.0–34.0)
MCV: 86.3 fL (ref 78.0–100.0)
Platelets: 206 10*3/uL (ref 150–400)
RBC: 4.22 MIL/uL (ref 4.22–5.81)
WBC: 8.6 10*3/uL (ref 4.0–10.5)

## 2010-08-19 LAB — BASIC METABOLIC PANEL
BUN: 43 mg/dL — ABNORMAL HIGH (ref 6–23)
CO2: 28 mEq/L (ref 19–32)
Chloride: 96 mEq/L (ref 96–112)
Creatinine, Ser: 2.59 mg/dL — ABNORMAL HIGH (ref 0.4–1.5)
Glucose, Bld: 368 mg/dL — ABNORMAL HIGH (ref 70–99)
Potassium: 4.2 mEq/L (ref 3.5–5.1)

## 2010-08-21 ENCOUNTER — Encounter: Payer: MEDICARE | Admitting: Internal Medicine

## 2010-08-21 ENCOUNTER — Other Ambulatory Visit: Payer: Self-pay | Admitting: Internal Medicine

## 2010-08-21 ENCOUNTER — Ambulatory Visit (HOSPITAL_COMMUNITY)
Admission: RE | Admit: 2010-08-21 | Discharge: 2010-08-21 | Disposition: A | Discharge status: Home / Self Care | Payer: MEDICARE | Source: Ambulatory Visit | Attending: Internal Medicine | Admitting: Internal Medicine

## 2010-08-21 DIAGNOSIS — E119 Type 2 diabetes mellitus without complications: Secondary | ICD-10-CM | POA: Insufficient documentation

## 2010-08-21 DIAGNOSIS — G4733 Obstructive sleep apnea (adult) (pediatric): Secondary | ICD-10-CM | POA: Insufficient documentation

## 2010-08-21 DIAGNOSIS — R195 Other fecal abnormalities: Secondary | ICD-10-CM

## 2010-08-21 DIAGNOSIS — Z01812 Encounter for preprocedural laboratory examination: Secondary | ICD-10-CM | POA: Insufficient documentation

## 2010-08-21 DIAGNOSIS — Z794 Long term (current) use of insulin: Secondary | ICD-10-CM | POA: Insufficient documentation

## 2010-08-21 DIAGNOSIS — D129 Benign neoplasm of anus and anal canal: Secondary | ICD-10-CM | POA: Insufficient documentation

## 2010-08-21 DIAGNOSIS — K5730 Diverticulosis of large intestine without perforation or abscess without bleeding: Secondary | ICD-10-CM

## 2010-08-21 DIAGNOSIS — D126 Benign neoplasm of colon, unspecified: Secondary | ICD-10-CM | POA: Insufficient documentation

## 2010-08-21 DIAGNOSIS — D128 Benign neoplasm of rectum: Secondary | ICD-10-CM

## 2010-08-21 DIAGNOSIS — K921 Melena: Secondary | ICD-10-CM | POA: Insufficient documentation

## 2010-08-21 DIAGNOSIS — I10 Essential (primary) hypertension: Secondary | ICD-10-CM | POA: Insufficient documentation

## 2010-08-21 DIAGNOSIS — Z79899 Other long term (current) drug therapy: Secondary | ICD-10-CM | POA: Insufficient documentation

## 2010-08-21 HISTORY — PX: COLONOSCOPY: SHX174

## 2010-08-25 NOTE — Progress Notes (Signed)
Cc to PCP 

## 2010-08-26 ENCOUNTER — Encounter: Payer: Self-pay | Admitting: Internal Medicine

## 2010-08-26 NOTE — Op Note (Signed)
  NAME:  [name], [name]                ACCOUNT NO.:  [id number]  MEDICAL RECORD NO.:  [id number]           PATIENT TYPE:  O  LOCATION:  DAYP                          FACILITY:  [facility]  PHYSICIAN:  R. Roetta Sessions, M.D. DATE OF BIRTH:  04/12/1956  DATE OF PROCEDURE:  08/21/2010 DATE OF DISCHARGE:                              OPERATIVE REPORT   PROCEDURE:  Colonoscopy with snare polypectomy and biopsy.  INDICATIONS FOR PROCEDURE:  A 55 year old gentleman recently hospitalized with abdominal pain and diarrhea, felt to have colitis on CT.  He was Hemoccult positive.  His acute symptoms have improved.  He is now undergoing colonoscopy.  Risks, benefits, limitations, alternatives, and imponderables have been discussed and questions answered.  Please see the documentation in the medical record.  PROCEDURE NOTE:  Because of polypharmacy, he is being done under deep sedation with propofol.  Instrumentation:  Pentax video chip system.  Digital rectal exam revealed no abnormalities.  Endoscopic Findings:  The prep was suboptimal, but doable.  Colon:  Colonic mucosa was surveyed from the rectosigmoid junction through the left transverse, right colon to the appendiceal orifice, ileocecal valve/cecum.  These structures were well seen, photographed for the record.  From this level, the scope was slowly and cautiously withdrawn.  All previously mentioned mucosal surfaces were again seen. The patient had scattered left-sided diverticulum.  A single diminutive polyp in the mid sigmoid which was cold snared, the remainder of the colonic mucosa appeared grossly normal.  Scope was pulled down in the rectum.  A thorough examination of the rectal mucosa including retroflexed view of the anal verge demonstrated a single diminutive polyp at 3 cm, which was cold biopsied.  The colon was elongated, tortuous requiring number of maneuvers including external abdominal pressure to reach the cecum.  The  patient tolerated the procedure well. Cecal withdrawal time 9 minutes.  IMPRESSION: 1. Elongated tortuous colon, sigmoid polyp, status post cold snare,     otherwise normal colon. 2. Diminutive rectal polyp, status post cold biopsy removal, otherwise     normal rectum.  RECOMMENDATIONS: 1. MiraLax 17 g orally daily for occasional constipation. 2. Diverticulosis literature, polyp literature provided. 3. Daily fiber supplement recommended. 4. Follow up on path. 5. Further recommendations to follow.     Jonathon Bellows, M.D.     RMR/MEDQ  D:  08/21/2010  T:  08/21/2010  Job:  [id number]  Electronically Signed by Lorrin Goodell M.D. on 08/26/2010 07:52:04 PM

## 2010-08-26 NOTE — Progress Notes (Signed)
Send abnormal metabolic profiles to pcp

## 2010-08-27 NOTE — Progress Notes (Signed)
Cc CBC & Met Panel to PCP

## 2010-08-28 ENCOUNTER — Encounter: Payer: Self-pay | Admitting: Internal Medicine

## 2010-09-09 NOTE — Op Note (Signed)
NAME:  [name], [name]                ACCOUNT NO.:  [id number]   MEDICAL RECORD NO.:  [id number]          PATIENT TYPE:  AMB   LOCATION:  SDS                          FACILITY:  [facility]   PHYSICIAN:  Artist Pais. Weingold, M.D.DATE OF BIRTH:  07/19/1955   DATE OF PROCEDURE:  09/26/2008  DATE OF DISCHARGE:  09/26/2008                               OPERATIVE REPORT   PREOPERATIVE DIAGNOSIS:  Chronic right carpal tunnel syndrome and right  hand Dupuytren contracture involving the long ring and small fingers.   POSTOPERATIVE DIAGNOSIS:  Chronic right carpal tunnel syndrome and right  hand Dupuytren contracture involving the long ring and small fingers.   PROCEDURE:  Excision of Dupuytren contracture involving the small finger  release of the proximal interphalangeal joint as well as ring and long  fingers in the palm at three separate incisions as well as a right  carpal tunnel release.   SURGEON:  Artist Pais. Mina Marble, MD   ASSISTANT:  None.   ANESTHESIA:  General.   TOURNIQUET TIME:  66 minutes.   COMPLICATIONS:  None.   DRAINS:  None.   OPERATIVE REPORT:  The patient was taken to the operating suite.  After  induction of adequate general anesthesia, right upper extremity was  prepped and draped in the sterile fashion.  An esmarch was used to  exsanguinate the limb.  Tourniquet was then inflated to 250 mmHg.  At  this point in time, a 2-cm incision was made at the palmar aspect of the  right hand in line with long and ring metacarpals starting at [facility]  cardinal line.  Skin was incised.  Palmar fascia was identified and  split the distal edge of the transverse.  Carpal ligament was identified  and split with 15-blade.  The median nerve was identified and protected  with Therapist, nutritional.  Remaining aspects of the transverse carpal  ligament was then divided under direct vision using curved blunt  scissors.  Canal was inspected.  There were no osseous lesions or  ganglions present.   It was irrigated and closed with 4-0 nylon and three  horizontal mattress sutures.  At this point in time, a Brunner incision  was made over the fifth digit, right hand starting at the level of the  mid palm going out at the level of the DIP flexion crease.  Skin was  incised sharply.  Flaps were raised off the neurovascular bundles.  The  Dupuytren contracture cord and ulnar side was traced to the level of the  PIP joint.  The ulnar neurovascular bundle was drawn completely to the  midline almost touching the radial neurovascular bundle and then back  toward the ulnar side.  This was carefully dissected free.  Once this  was done, the remaining aspects of the cord, ulnar based were removed.  The PIP joint and MCP joint were gently manipulate into full extension.  Both neurovascular bundles were carefully identified and retracted.  The  same procedure was performed on the ring and long finger on the right  hand, although this dissection was carried down to the level of  the  metacarpophalangeal joint not onto the finger itself.  At all times,  neurovascular bundles were carefully retracted.  At the end of the  procedure, the wounds were thoroughly irrigated.  A #5 feeding tube was  placed into the small finger wound, it was loosely closed with 4-0  nylon.  All three incisions were closed with 4-0 nylon and dressed with  Xeroform, 4 x 4s fluffs, and a volar splint.  At this point in time, 2  mL of Celestone and 2 mL Marcaine were injected into the #5 pediatric  feeding tube and then removed for postoperative pain control.  The  patient tolerated the procedures well and went to recovery in stable  fashion.      Artist Pais Mina Marble, M.D.  Electronically Signed     MAW/MEDQ  D:  09/26/2008  T:  09/27/2008  Job:  [id number]

## 2010-09-09 NOTE — Consult Note (Signed)
CONSULT REQUESTED BY:  Erle Crocker, MD   REASON FOR CONSULTATION:  Consult requested for the evaluation of low  back pain.   CHIEF COMPLAINT:  Low back pain with left thigh pain.   HISTORY:  A 55 year old male who had a back injury in the [place].  He went  through some epidural steroid injections at the [place], but these were not  very helpful for him.  He has had MRI scan as recently as January 2009.  Findings were consistent with lumbar degenerative disk with small left  paracentral focal protrusion with some neural foraminal narrowing, but  no L4 nerve root entrapment.  There was degenerative changes in the  lumbar facets, more on the right than on the left at L4-5 and L5-S1.  In  addition, there was a central bulge at L3-4 but once again without  central or foraminal stenosis.   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  There was some spinal cord signal changes as well.  He also had neural foraminal narrowing at C4-5.   The patient also had x-rays of his cervical spine, which were done at  [facility], showed intact fusion.  He had a C4-C7 fusion per  Dr. Durenda Hurt in [place] approximately 2 months ago.   His neck is feeling better.   His average pain is 3-4/10, but currently 7, increases with walking and  standing, improves with heat, medications, TENS as well as injections.  He is walking on a treadmill about 20-30 minutes at a time.  After the  treadmill, he can walk 10-15 minutes.  He can climb steps.  He drives.  He is independent with all of his self-care.   REVIEW OF SYSTEMS:  Positive for numbness, tingling, trouble walking,  weight loss with lower blood sugars.  He has been able to come off his  insulin  pump and reduced his dose of Lantus due to weight loss.  He has  had some with limb swelling.  He has had past history of CHF with  associated shortness of breath with activity.   SOCIAL HISTORY:  Married and lives with his wife, who was also the  patient at this clinic.  He smokes 1-1/2 to 1-pack per day and does not  use any alcohol.   FAMILY HISTORY:  Heart disease, diabetes, and high blood pressure.   PHYSICAL EXAMINATION:  VITAL SIGNS:  Blood pressure 167/94, pulse 86,  respirations 18, and O2 sat 98% room air.  GENERAL:  An obese male in no acute distress.  Orientation x3.  Affect  is normal.  MUSCULOSKELETAL:  He is able to toe walk and heel walk.  His sensation  is mildly reduced in the right C8 dermatome as well as right L5  dermatome, but only in the big toe.  He also has left L4-L5 and S1  dermatome decreased to pinprick.  His motor strength is 5/5 bilateral  deltoid, biceps and grip as well as hip flexor, knee extension, ankle  dorsiflexion strength.   He has tenderness to palpation in bilateral lumbar paraspinal area.  He  has pain with extension with limitation or extension to about 25% of  normal as well as flexion is about  75% normal, and accompanied by pain  relief.  His has neck reduced range of motion at about 50% in all  planes.  He has a mild tenderness to palpation in the cervical facet  region around C4-5 area.   His upper extremity and lower extremity range of motion are normal with  exception in right shoulder, which has reduced abduction, internal, and  external rotation.   IMPRESSION:  1. Lumbar pain.  He has a lumbar degenerative disk with lumbar      spondylosis I believe that he does have a facet syndrome and this      may be responsible for some of his low back pain and possibly some      pseudo sciatica due to radiated pain from the left lumbar facet.  2. Cervical post laminectomy syndrome, likely he has cervical      spondylosis at the level  above effusion as well.  3. Question of concomitant diabetic neuropathy particularly on the      right side and seems to have just his big toe affected in terms of      sensory loss and on the left side, he does have below the ankle.   RECOMMENDATIONS:  1. We will set him up for medial branch blocks to further elucidate      the cause of his pain.  2. In terms of the medication management, he actually has done better      with oxycodone and hydrocodone, takes a fairly modest amounts, two      7.5 twice a day, it keeps him fairly functional on the treadmill,      losing weight, and stretching.   As I indicated to the patient, we will need to the urine drug screen  first and if he has no signs of any illicit drugs or nondisclosed  opiates, I would be able to institute oxycodone 15 mg p.o. b.i.d.   Thank you for this interesting consultation.  I will keep you apprised  of his progress.      Erick Colace, M.D.  Electronically Signed     AEK/MedQ  D:03/16/2008 15:12:42  T:03/17/2008 04:50:09  Job #:  [id number]   cc:   Erle Crocker, M.D.

## 2010-09-09 NOTE — Procedures (Signed)
NAME:  [name], [name]       VZDGLOV NO.:  [id number]   MEDICAL RECORD NO.:  [id number]           PATIENT TYPE:   LOCATION:                                 FACILITY:   PHYSICIAN:  Erick Colace, M.D.DATE OF BIRTH:  Dec 22, 1955   DATE OF PROCEDURE:  04/02/2008  DATE OF DISCHARGE:                               OPERATIVE REPORT   PROCEDURE:  Bilateral L5 dorsal ramus injection, bilateral L4 medial  branch block, bilateral L3 medial branch block under fluoroscopic  guidance.   INDICATION:  Lumbar spondylosis without myelopathy.  Pain is only  partially responsive to medication management including narcotic  analgesics.   Informed consent was obtained after describing risks and benefits of the  procedure with the patient.  These include bleeding, bruising,  infection.  He elects to proceed and has given written consent.  The  patient was placed prone on fluoroscopy table.  Betadine prep, sterile  drape.  A 25-gauge inch and a half needle was used to anesthetize the  skin and subcu tissue, 1% lidocaine x2 mL, and a 22-gauge 3-1/2-inch  spinal needle was inserted under fluoroscopic guidance first targeting  the left S1 SAP sacral ala junction, bone contact made, confirmed with  lateral imaging.  Omnipaque 180 x0.5 mL demonstrated no intravascular  uptake and 0.5 mL of solution containing 1 mL of 40 mg/mL dexamethasone  and 2 mL of 0.25% Marcaine were injected.  Then, the left L5 SAP  transverse process junction targeted, bone contact made, confirmed with  lateral imaging.  Omnipaque 180 x0.5 mL demonstrated no intravascular  uptake and 0.5 mL dexamethasone-lidocaine solution injected in the left  L4 SAP transverse process junction targeted, bone contact made.  Omnipaque 180 x0.5 mL demonstrated no intravascular uptake.  Then, 0.5  mL of dexamethasone-lidocaine solution was injected.  The same procedure  was repeated on the right side with the same needle injectate and  technique.  The patient tolerated the procedure well.  Pre and  postinjection vitals stable.  Postinjection structures given.      Erick Colace, M.D.  Electronically Signed     AEK/MEDQ  D:  04/02/2008 11:59:35  T:  04/03/2008 02:01:01  Job:  [id number]

## 2010-09-09 NOTE — Assessment & Plan Note (Signed)
Oswestry score is 50%.  He was last seen by me for medial branch blocks  L3, 4, 5, had at least 50% relief for a week or so.  He has similar  improvement last time.  He is seeing Hand Surgery today.   He has had no new medical problems at interval time.   CURRENT MEDICATIONS:  Oxycodone 15 p.o. b.i.d. to t.i.d. #7 per month.   PHYSICAL EXAMINATION:  VITAL SIGNS:  157/87, pulse 79, respirations 18,  and O2 sat 94% on room air.  GENERAL:  No acute distress.  Mood and affect is appropriate.  BACK:  Pain with hyperextension, but relief with flexion.  EXTREMITIES:  He has no lower extremity weakness.  He has negative  straight leg raising test.  Good lower extremity range of motion.   IMPRESSION:  1. Lumbar facet mediated pain.  2. Lumbar spondylosis without myelopathy.  We will go ahead and set      him up for lumbar radiofrequency procedure first on the right than      left.  The patient agrees with plan.      Erick Colace, M.D.  Electronically Signed     AEK/MedQ  D:  06/26/2008 18:08:31  T:  06/27/2008 05:36:26  Job #:  [id number]

## 2010-09-09 NOTE — Procedures (Signed)
NAME:  [name], [name]                ACCOUNT NO.:  [id number]   MEDICAL RECORD NO.:  [id number]          PATIENT TYPE:  AMB   LOCATION:  SDS                          FACILITY:  [facility]   PHYSICIAN:  Erick Colace, M.D.DATE OF BIRTH:  1955/06/02   DATE OF PROCEDURE:  09/27/2008  DATE OF DISCHARGE:  09/26/2008                               OPERATIVE REPORT   This is a left L5 dorsal ramus radiofrequency neurotomy, left L4 medial  branch radiofrequency neurotomy, left L3 medial branch radiofrequency  neurotomy under fluoroscopic guidance.   INDICATIONS:  Lumbar facet mediated pain improved with medial branch  blocks x2.  He had good relief with right-sided procedure performed 1  month ago.  The pain is only partially responsive to medication  management and other conservative care and interferes with self-care and  mobility.  Informed consent was obtained after describing risks and  benefits of the procedure with the patient.  These include bleeding,  bruising, infection.  He elects to proceed and has given written  consent.  The patient placed prone on fluoroscopy table.  Betadine prep,  sterile drape, 25-gauge inch and half needle was used to anesthetize the  skin and subcutaneous tissue 1% lidocaine x2.  A 20-gauge 10 cm RF  needle with 10-mm curved active tip was inserted under fluoroscopic  guidance first starting at the left S1 SAP sacral ala junction, bone  contact made, confirmed with lateral imaging.  Sensory stem at 50 Hz  followed by motor stem at 2 Hz confirmed proper needle location followed  by injection of 1 mL of solution containing 1 mL of 4 mg/mL  dexamethasone, 2 mL of 1% MPF lidocaine followed by radiofrequency  lesioning 70 degrees Celsius for 70 seconds in the left L5 SAP  transverse process  junction, targeted bone contact made, confirmed with  lateral imaging.  Sensory stem at 50 Hz followed by motor stem at 2 Hz  confirmed proper needle location followed by  injection 1 mL of  dexamethasone lidocaine solution and radiofrequency lesioning 70 degrees  Celsius for 70 seconds in the left L4 SAP transverse process junction,  targeted bone contact made, confirmed with lateral imaging.  Sensory  stem at 50 Hz followed by motor stem at 2 Hz confirmed proper needle  location followed by injection of 1 mL of dexamethasone lidocaine  solution and radiofrequency lesioning 70 degrees Celsius for 70 seconds.  The patient tolerated the procedure well.  Pre and post injection vitals  stable.  Post injection instructions given.  I will see him back in 1  month for followup after procedure.      Erick Colace, M.D.  Electronically Signed     AEK/MEDQ  D:  09/27/2008 09:28:43  T:  09/28/2008 00:04:01  Job:  [id number]   cc:   Erle Crocker, M.D.

## 2010-09-09 NOTE — Assessment & Plan Note (Signed)
Mr. Patti follows up today.  He has undergone a left L5 dorsal ramus, L4  medial branch, and L3 medial branch radiofrequency neurotomy.  He is  under fluoroscopic guidance September 26, 2008.  He has done fairly well with  this.  He does have some lower down pain than before.  The lumbar pain  has improved.  His pain that is lower down, it is more on the right side  than the left side.     The patient has also had further evaluation for numbness and tingling  in the hands and feet.  He has been diagnosed with diabetic neuropathy  affecting the hands and feet per his report.  He is also getting MRI of  his brain for a mental lapses.   His pain level is 4/10 on average currently 8.  Described as sharp,  stabbing, constant, and aching.  Sleep is fair.  Relief from meds is  fair.  He walks 10 minutes at a time.  He climbs steps.  He drives.   His review of systems is positive for numbness, tremor, tingling,  trouble walking, dizziness, limb swelling, sleep problems, and blood  pressure regulation problems.   PHYSICAL EXAMINATION:  VITAL SIGNS:  Blood pressure 118/74, pulse 81,  respirations 18, O2 sats 96% on room air.  GENERAL:  No acute distress.  Mood and affect appropriate.  BACK:  He has some tenderness, right PSIS.  Lower extremity strength is  normal.  Gait is normal.  Hip range of motion is full.   IMPRESSION:  Right sacroiliac joint disorder.  I discussed with the  patient that I will see this once pain from the facets is adequately  relieved.  There may be some underlying sacroiliac pain, this is what I  suspect is the case, given the location and the time frame.  He does not  appear to have any hip intra-articular problem.  I do not think that  this represents radiculopathy or a complication of the procedure.   PLAN:  We will set him up for a right sacroiliac injection under  fluoroscopic guidance.   Heparin his prescription for oxycodone 50 mg 1 tablet 2-3 times per day.  His  last one was filled June 15, so that he actually has been using more  like 2 a day rather than 3 a day.      Erick Colace, M.D.  Electronically Signed     AEK/MedQ  D:  11/15/2008 13:17:26  T:  11/16/2008 02:39:05  Job #:  [id number]   cc:   Erle Crocker, M.D.   Michael L. Thad Ranger, M.D.  Fax: [phone]

## 2010-09-09 NOTE — Procedures (Signed)
NAME:  [name], [name]                ACCOUNT NO.:  [id number]   MEDICAL RECORD NO.:  [id number]           PATIENT TYPE:   LOCATION:                                 FACILITY:   PHYSICIAN:  Erick Colace, M.D.DATE OF BIRTH:  [birth year]   DATE OF PROCEDURE:  DATE OF DISCHARGE:                               OPERATIVE REPORT   This is bilateral L5 dorsal ramus injection, bilateral L4 medial branch  block, bilateral L3 medial branch block under fluoroscopic guidance.   INDICATIONS:  Lumbosacral spondylosis without myelopathy.  He has pain  which is only partially responsive to medication management.  He has had  at least a 3-week response to medial branch blocks and pain is recurring  to the former levels.  Oswestry disability index score of 46% which was  the preinjection level.   Informed consent was obtained after describing risks and benefits of the  procedure with the patient.  These include bleeding, bruising,  infection, temporary or permanent paralysis.  He elects to proceed and  has given written consent.  The patient was placed prone on fluoroscopy  table.  Betadine prep, sterile drape. A 25-gauge inch and a half needle  was used to anesthetize the skin and subcutaneous tissue, 1% lidocaine  x2 mL.  Then, a 22-gauge 3-1/2-inch spinal needle was inserted under  fluoroscopic guidance targeting the left S1 SAP sacral ala junction,  bone contact made, confirmed with lateral imaging.  Omnipaque 180 x0.5  mL demonstrated no intravascular uptake.  Then, 0.5 mL of the solution  containing 40 mg/mL dexamethasone x1 mL and 2 mL of 2% MPF lidocaine  were injected.  Then, the left L5 SAP transverse junction targeted, bone  contact made, confirmed with lateral imaging.  Omnipaque 180 x0.5 mL  demonstrated no intravascular uptake.  Then, 0.5 mL of the dexamethasone  lidocaine solution injected.  Then, left L4 SAP transverse junction  targeted, bone contact made.  Omnipaque 180 x0.5 mL  demonstrated no  intravascular uptake.  Then, 0.5 mL dexamethasone lidocaine solution was  injected.  The same procedure was repeated on the right side at  corresponding levels using same needle injectate and technique.  The  patient tolerated the procedure well.  Pre and postinjection vitals  stable.  Postinjection instructions given.  Preinjection pain level 6/10  and postinjection 4/10.  We will have the patient return in  approximately 1 month for followup visit to discuss radiofrequency if  the patient that does obtain 50% of relief.      Erick Colace, M.D.  Electronically Signed     AEK/MEDQ  D:  05/31/2008 10:39:23  T:  05/31/2008 23:12:32  Job:  [id number]

## 2010-09-09 NOTE — Procedures (Signed)
NAME:  [name], [name]                ACCOUNT NO.:  [id number]   MEDICAL RECORD NO.:  [id number]           PATIENT TYPE:   LOCATION:                                 FACILITY:   PHYSICIAN:  Erick Colace, M.D.DATE OF BIRTH:  1955-05-23   DATE OF PROCEDURE:  DATE OF DISCHARGE:                               OPERATIVE REPORT   PROCEDURE:  Right-sided L5 dorsal ramus radiofrequency neurotomy, right  L4 medial branch radiofrequency neurotomy, right L3 medial branch  radiofrequency neurotomy under fluoroscopic guidance.   INDICATIONS:  Lumbar facet mediated pain which was improved with medial  branch blocks x2.  Pain interferes with self-care mobility and is only  partially responsive to medication management.   Informed consent was obtained after describing the risks and benefits of  the procedure with the patient.  These include bleeding, bruising, and  infection.  He elects to proceed and has given written consent.  The  patient placed prone on fluoroscopy table.  Betadine prep, sterilely  draped.  A 25-gauge, 1-1/2-inch needle was used to anesthetize the skin  and subcutaneous tissue, 1% lidocaine x2 cc.  Then, a 20-gauge, 10-cm RF  needle with 10-mm curved active tip was inserted under fluoroscopic  guidance first targeting the right S1 SAP sacral ala junction.  Bone  contact made, confirmed with lateral imaging.  Sensory stem at 50 Hz  followed by motor stem at 2 Hz confirmed proper needle location followed  by injection of solution containing 1 mL of 4 mg/mL dexamethasone and 2  mL of 1% MPF lidocaine followed by radiofrequency lesioning 70 degrees  Celsius for 70 seconds, and the right L5 SAP transverse process junction  targeted.  Bone contact made, confirmed with lateral imaging.  Sensory  stem at 50 Hz followed by motor stem at 2 Hz confirmed proper needle  location followed by injection of 1 mL of dexamethasone lidocaine  solution and radiofrequency lesioning 70 degrees  Celsius for 70 seconds,  and the right L4 SAP transverse process junction targeted.  Bone contact  made, confirmed with lateral imaging.  Sensory stem at 50 Hz followed by  motor stem at 2 Hz confirmed proper needle location followed by  injection of 1 mL dexamethasone lidocaine solution and radiofrequency  lesioning 70 degrees Celsius for 70 seconds.  The patient tolerated the  procedure well.  Pre injection pain level 7/10.  Post injection 3/10.  We will see him back in 1 month for the left side.      Erick Colace, M.D.  Electronically Signed     AEK/MEDQ  D:  07/30/2008 18:00:17  T:  07/31/2008 04:35:48  Job:  [id number]

## 2010-09-09 NOTE — Assessment & Plan Note (Signed)
[name] returns today.  He was last seen by me for bilateral medial  branch blocks on April 02, 2008, at the L3-L4, L4-L5.  He has done  well with the blocks.  They lasted 2.5-3 weeks.  He went from a  preinjection pain score of 9/10 to a postinjection pain score of 2-3/10.  His pain has returned currently at 5/10 level.  His Oswestry disability  index calculated today 38%, which compares to preinjection 46% and no  significant difference.   His pain increased with driving, worse also with walking and bending,  relieved by heat, medication, TENS and injections.  His walking  tolerance is 20-30 minutes.   Currently, he has a flu-like illness basically stomach flu per his  report.  No diarrhea or constipation, but has nausea.  He states that he  is not out of pain medicine in fact he has some pain medicine left over.   PHYSICAL EXAMINATION:  GENERAL:  In no acute distress.  Mood and affect  appropriate.  MUSCULOSKELETAL:  His back has some tenderness to palpation, lumbar  paraspinal.  Lumbar range of motion is 50% forward flexion and  extension.  Lower extremity strength is full.  Deep tendon reflexe is  decreased in right ankle, but normal bilateral knee as well as left  ankle.  Hip, knee, and ankle range of  motion are full.   IMPRESSION:  Lumbosacral spondylosis without myelopathy.  He does have a  reduced right ankle jerk, but no evidence of radicular pain.   PLAN:  I will go ahead and ask him to reschedule in approximately 3-4  weeks another confirmatory medial branch block, further evaluate the  utility of radiofrequency neurotomy.   We will continue his current meds, oxycodone 15 mg b.i.d. for activity-  related pain.      Erick Colace, M.D.  Electronically Signed     AEK/MedQ  D:  04/30/2008 09:32:02  T:  05/01/2008 00:51:22  Job #:  [id number]

## 2010-09-09 NOTE — Procedures (Signed)
NAME:  [name], [name]                ACCOUNT NO.:  [id number]   MEDICAL RECORD NO.:  [id number]           PATIENT TYPE:   LOCATION:                                 FACILITY:   PHYSICIAN:  Erick Colace, M.D.DATE OF BIRTH:  [birth year]   DATE OF PROCEDURE:  04/02/2008  DATE OF DISCHARGE:                               OPERATIVE REPORT   PROCEDURE:  Bilateral L5 dorsal ramus injection, bilateral L4 medial  branch block, bilateral L3 medial branch block under fluoroscopic  guidance.   INDICATION:  Lumbar spondylosis without myelopathy.  Pain is only  partially responsive to medication management including narcotic  analgesics.   Informed consent was obtained after describing risks and benefits of the  procedure with the patient.  These include bleeding, bruising,  infection.  He elects to proceed and has given written consent.  The  patient was placed prone on fluoroscopy table.  Betadine prep, sterile  drape.  A 25-gauge inch and a half needle was used to anesthetize the  skin and subcu tissue, 1% lidocaine x2 mL, and a 22-gauge 3-1/2-inch  spinal needle was inserted under fluoroscopic guidance first targeting  the left S1 SAP sacral ala junction, bone contact made, confirmed with  lateral imaging.  Omnipaque 180 x0.5 mL demonstrated no intravascular  uptake and 0.5 mL of solution containing 1 mL of 40 mg/mL dexamethasone  and 2 mL of 0.25% Marcaine were injected.  Then, the left L5 SAP  transverse process junction targeted, bone contact made, confirmed with  lateral imaging.  Omnipaque 180 x0.5 mL demonstrated no intravascular  uptake and 0.5 mL dexamethasone-lidocaine solution injected in the left  L4 SAP transverse process junction targeted, bone contact made.  Omnipaque 180 x0.5 mL demonstrated no intravascular uptake.  Then, 0.5  mL of dexamethasone-lidocaine solution was injected.  The same procedure  was repeated on the right side with the same needle injectate and  technique.  The patient tolerated the procedure well.  Pre and  postinjection vitals stable.  Postinjection structures given.      Erick Colace, M.D.  Electronically Signed     AEK/MEDQ  D:  04/02/2008 11:59:35  T:  04/03/2008 02:01:01  Job:  [id number]

## 2010-09-12 NOTE — Procedures (Signed)
NAME:  [name], [name]                ACCOUNT NO.:  [id number]   MEDICAL RECORD NO.:  [id number]          PATIENT TYPE:  REC   LOCATION:  [facility]                          FACILITY:  [facility]   PHYSICIAN:  Erick Colace, M.D.DATE OF BIRTH:  [birth year]   DATE OF PROCEDURE:  01/03/2009  DATE OF DISCHARGE:                               OPERATIVE REPORT   PROCEDURE:  Right sacroiliac injection under fluoroscopic guidance.   INDICATIONS:  Right sacroiliac pain, right buttocks pain only partially  responsive to medication management including narcotic analgesics and  interfering with self-care and mobility.  Pain is rated as 7/10 on  average interfering with walking and standing and bending.  Interfering  with household duties.   Informed consent was obtained after describing risks and benefits of the  procedure with the patient.  These include bleeding, bruising,  infection.  She elects to proceed and has given written consent.  The  patient placed prone on fluoroscopy table.  Betadine prep, sterile  drape, 25-gauge inch and half needle was used to anesthetize the skin  and subcutaneous tissue with 1% lidocaine x2 mL.  Then, a 25-gauge, 3-  1/2-inch spinal needle was inserted under fluoroscopic guidance to the  right SI joint.  AP, lateral, and oblique images utilized.  Omnipaque  180 under live fluoro demonstrated no intravascular uptake.  Good joint  outline followed by injection of 1 mL of 2% MPF lidocaine, 1-1/2 mL of  40 mg/mL Depo-Medrol.  The patient tolerated the procedure well.  Pre  and post injection vitals stable.  Post injection instructions given.  I  will see him back in 3 months, nursing visit in 2 months x2.      Erick Colace, M.D.  Electronically Signed     AEK/MEDQ  D:  01/03/2009 14:27:26  T:  01/04/2009 07:44:00  Job:  [id number]

## 2010-11-26 DIAGNOSIS — K3184 Gastroparesis: Secondary | ICD-10-CM

## 2010-11-26 HISTORY — DX: Gastroparesis: K31.84

## 2010-12-08 ENCOUNTER — Encounter: Payer: Self-pay | Admitting: Gastroenterology

## 2010-12-09 ENCOUNTER — Encounter: Payer: Self-pay | Admitting: Gastroenterology

## 2010-12-09 ENCOUNTER — Ambulatory Visit (INDEPENDENT_AMBULATORY_CARE_PROVIDER_SITE_OTHER): Payer: Medicare Other | Admitting: Gastroenterology

## 2010-12-09 DIAGNOSIS — R112 Nausea with vomiting, unspecified: Secondary | ICD-10-CM

## 2010-12-09 DIAGNOSIS — L739 Follicular disorder, unspecified: Secondary | ICD-10-CM

## 2010-12-09 DIAGNOSIS — L738 Other specified follicular disorders: Secondary | ICD-10-CM

## 2010-12-09 MED ORDER — CHLORHEXIDINE GLUCONATE 4 % EX LIQD: Freq: Every day | CUTANEOUS | Status: AC | PRN

## 2010-12-09 MED ORDER — ONDANSETRON HCL 4 MG PO TABS: 4.0000 mg | ORAL_TABLET | Freq: Three times a day (TID) | ORAL | Status: DC | PRN

## 2010-12-09 MED ORDER — SULFAMETHOXAZOLE-TMP DS 800-160 MG PO TABS: 1.0000 | ORAL_TABLET | Freq: Two times a day (BID) | ORAL | Status: AC

## 2010-12-09 NOTE — Progress Notes (Addendum)
Referring Provider: Cristie Hem, MD Primary Care Physician:  Cristie Hem, MD, MD Primary Gastroenterologist: Dr. Jena Gauss   Chief Complaint  Patient presents with  . Emesis    3-4 times a day/ has lost 3 sizes in 2 months  . Nausea    HPI:   [name] is a 55 year old Caucasian male who returns today in f/u for chronic N/V. He was hospitalized earlier this year with findings on CT of diffuse colitis. EGD done during hospitalization for coffee ground emesis showed erosive reflux esophagitis, Mallory-Weiss tear, small hiatal hernia. In interim from last visit, he has undergone a colonoscopy with findings of torturous colon, hyperplastic polyp. He continues to complain of N/V at least once per day. Sometimes multiple times a day. Complains of foul flatus, burping. Able to hold down water. Takes meds one or two at a time, able to keep that down. Will eat a little broth, toast, crackers. Last Sunday first time with meat in past several weeks. Bad belching. Episodes of intermittent constipation when not taking in much po. Large BMs, size of hamburger pattys.   Reports last week went to PCP's office, leukocytosis?  Majority of the time that is sick is in morning, food from night before and lunch before will come up.  Down 12 lbs since last visit.   On a different note, pt reports chronic hx of "cysts" that require "drainage and abx". He reports self-lancing these areas. He has a dermatologist that he sees. From description, sounds like folliculitis. Several small areas on back, cystic in appearance, states wife has to "drain them" occasionally. Has hx of these lesions located in groin, back, head, neck, armpit. Requesting hibiclens and abx. States the other evening he took a plastic scalpel and lanced the area at the perineum and filled up a washcloth with purulent drainage.   Addendum: received lab reports from 11/27/2010. Mild leukocytosis of 13. No anemia. A1c 8.7  Past Medical History  Diagnosis  Date  . Diabetes mellitus   . Hypertension   . CHF (congestive heart failure)   . CRD (chronic renal disease)   . Arthritis   . Obesity   . Depression   . Sleep apnea   . Diabetic neuropathy   . CAD (coronary artery disease)   . Mitral regurgitation   . Esophagitis, reflux 07/07/10    MW tear, erosive reflux esophagitis, 2cm hh  . S/P colonoscopy April 2012    Rourk: torturous colon, hyperplastic polyps    Past Surgical History  Procedure Date  . Back surgery     multiple  . Neck surgery   . Hand surgery     bilateral  . Colonoscopy before 2002    [place]  . Appendectomy     Current Outpatient Prescriptions  Medication Sig Dispense Refill  . amLODipine (NORVASC) 2.5 MG tablet Take 1 tablet by mouth Three times a day. Until meds are gone      . baclofen (LIORESAL) 10 MG tablet Take 1 tablet by mouth 4 (four) times daily.      Marland Kitchen FLUoxetine (PROZAC) 40 MG capsule Take 1 capsule by mouth daily.      . furosemide (LASIX) 40 MG tablet Take 1 tablet by mouth Twice daily as needed.      . gabapentin (NEURONTIN) 400 MG capsule Take 1 tablet by mouth 3 (three) times daily with meals.      . insulin NPH-insulin regular (NOVOLIN 70/30) (70-30) 100 UNIT/ML injection Inject 55 Units into the skin  daily with breakfast.        . insulin NPH-insulin regular (NOVOLIN 70/30) (70-30) 100 UNIT/ML injection Inject 100 Units into the skin daily with supper.        . losartan (COZAAR) 100 MG tablet Take 1 tablet by mouth daily.      . metoprolol succinate (TOPROL-XL) 25 MG 24 hr tablet Take 1 tablet by mouth daily.      . ondansetron (ZOFRAN) 4 MG tablet Take 1 tablet (4 mg total) by mouth every 8 (eight) hours as needed.  30 tablet  3  . oxycodone (ROXICODONE) 30 MG immediate release tablet Take 1 tablet by mouth 4 (four) times daily.      . pantoprazole (PROTONIX) 40 MG tablet Take 1 tablet by mouth daily.      Marland Kitchen rOPINIRole (REQUIP) 1 MG tablet Take 4 tablets by mouth At bedtime.      .  simvastatin (ZOCOR) 40 MG tablet Take 0.5 tablets by mouth daily.      Marland Kitchen spironolactone (ALDACTONE) 25 MG tablet Take 1 tablet by mouth daily.      . traZODone (DESYREL) 100 MG tablet Take 1 tablet by mouth At bedtime.      . chlorhexidine (HIBICLENS) 4 % external liquid Apply topically daily as needed.  120 mL  0  . sulfamethoxazole-trimethoprim (BACTRIM DS) 800-160 MG per tablet Take 1 tablet by mouth 2 (two) times daily.  14 tablet  0    Allergies as of 12/09/2010  . (No Known Allergies)    Family History  Problem Relation Age of Onset  . Stroke Father 75  . Stroke Mother 51  . Colon cancer Neg Hx   . Crohn's disease Neg Hx   . Cirrhosis Neg Hx   . Ulcerative colitis Neg Hx   . Stomach cancer Neg Hx     History   Social History  . Marital Status: Married    Spouse Name: N/A    Number of Children: 1  . Years of Education: N/A   Occupational History  . disabled    Social History Main Topics  . Smoking status: Current Everyday Smoker -- 1.0 packs/day for 40 years    Types: Cigarettes  . Smokeless tobacco: Never Used   Comment: ordered nicotine patches. start date is 27 July 2010  . Alcohol Use: No  . Drug Use: No  . Sexually Active: Yes     erectile dysfunction   Other Topics Concern  . None   Social History Narrative   Lost one dgt due to homicide.    Review of Systems: Gen: Denies fever, chills, anorexia. Denies fatigue, weakness, weight loss.  CV: Denies chest pain, palpitations, syncope, peripheral edema, and claudication. Resp: Denies dyspnea at rest, cough, wheezing, coughing up blood, and pleurisy. GI: Denies vomiting blood, jaundice, and fecal incontinence.   Denies dysphagia or odynophagia. Derm: Denies rash, itching, dry skin. Complains of boil in perineum area. Multiple sites (?hx of folliculitis?) Psych: Denies depression, anxiety, memory loss, confusion. No homicidal or suicidal ideation.  Heme: Denies bruising, bleeding, and enlarged lymph  nodes.  Physical Exam: BP 99/65  Pulse 68  Temp(Src) 98.1 F (36.7 C) (Temporal)  Ht 6' (1.829 m)  Wt 294 lb 9.6 oz (133.63 kg)  BMI 39.96 kg/m2 General:   Alert and oriented. No distress noted. Pleasant and cooperative.  Head:  Normocephalic and atraumatic. Eyes:  Conjuctiva clear without scleral icterus. Mouth:  Oral mucosa pink and moist. Good dentition. No lesions.  Neck:  Supple, without mass or thyromegaly. Heart:  S1, S2 present without murmurs, rubs, or gallops. Regular rate and rhythm. Abdomen:  +BS, soft, obese, non-tender and non-distended. No rebound or guarding. No HSM or masses noted. Msk:  Symmetrical without gross deformities. Normal posture. Rectal: only external, limited exam. Very small slit noted where pt self-lanced a boil. Located perineum area. No induration or drainage noted.  Extremities:  Without edema. Neurologic:  Alert and  oriented x4;  grossly normal neurologically. Skin:  Intact without significant lesions or rashes. Cervical Nodes:  No significant cervical adenopathy. Psych:  Alert and cooperative. Normal mood and affect.

## 2010-12-09 NOTE — Patient Instructions (Signed)
We have set you up for a gastric emptying study. We will be calling you with the results.  Please contact your dermatologist if any exacerbation of the area we addressed today.  Start taking a probiotic daily (Digestive Advantage, Align, Restora, etc)  We will see you back in 3 months or sooner if needed.   Please start following the gastroparesis diet handout.

## 2010-12-09 NOTE — Assessment & Plan Note (Signed)
55 year old Caucasian male with continued N/V, at least daily, with retained food contents in emesis in the morning. Wt loss of 12 lbs since last visit. Has had EGD and colonoscopy this year. Need to obtain labs from PCP. In setting of brittle diabetes, concern for gastroparesis. Will set up GES, provide gastroparesis diet handout. Further recommendations to follow. Continue PPI.

## 2010-12-09 NOTE — Progress Notes (Signed)
Cc to PCP 

## 2010-12-09 NOTE — Assessment & Plan Note (Signed)
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 Has dermatologist, has not seen in awhile. Pt has tendency to treat himself, lancing areas and draining when he feels necessary. Perineum with small healing incision that is consistent with pt self-lancing. No induration or drainage noted; however, slight tenderness to touch. Pt is requesting abx. Although no signs of infection, pt does have multiple comorbidities, diabetes, which will make wound healing difficult. Will provide 7 day course of abx. Further treatment needs to come from PCP or dermatologist. Pt requesting hibiclens as well. States uses sparingly. Needs to f/u with dermatologist in future. One refill provided.

## 2010-12-17 ENCOUNTER — Encounter (HOSPITAL_COMMUNITY): Payer: Medicare Other

## 2010-12-18 ENCOUNTER — Encounter (HOSPITAL_COMMUNITY): Payer: Self-pay

## 2010-12-18 ENCOUNTER — Encounter (HOSPITAL_COMMUNITY)
Admission: RE | Admit: 2010-12-18 | Discharge: 2010-12-18 | Disposition: A | Discharge status: Home / Self Care | Payer: Medicare Other | Source: Ambulatory Visit | Attending: Gastroenterology | Admitting: Gastroenterology

## 2010-12-18 DIAGNOSIS — R933 Abnormal findings on diagnostic imaging of other parts of digestive tract: Secondary | ICD-10-CM | POA: Insufficient documentation

## 2010-12-18 DIAGNOSIS — R112 Nausea with vomiting, unspecified: Secondary | ICD-10-CM | POA: Insufficient documentation

## 2010-12-18 MED ORDER — TECHNETIUM TC 99M SULFUR COLLOID
2.0000 | Freq: Once | INTRAVENOUS | Status: AC | PRN
  Administered 2010-12-18: 1.9 via ORAL

## 2010-12-24 NOTE — Progress Notes (Signed)
Quick Note:  Discussed with Dr. Jena Gauss. EGD recently on file. Doubt dealing with gastric outlet obstruction. Glad to hear he is doing well with small meals. Let's have him come back in 4 weeks for a weight check and update. If he has any protracted N/V, call us immediately. ______

## 2010-12-30 NOTE — Progress Notes (Signed)
Quick Note:  Tried to call pt- LMOM ______ 

## 2011-01-01 ENCOUNTER — Encounter: Payer: Self-pay | Admitting: Internal Medicine

## 2011-01-27 ENCOUNTER — Encounter (HOSPITAL_COMMUNITY): Payer: Self-pay | Admitting: Emergency Medicine

## 2011-01-27 ENCOUNTER — Emergency Department (HOSPITAL_COMMUNITY): Payer: Medicare Other

## 2011-01-27 ENCOUNTER — Emergency Department (HOSPITAL_COMMUNITY)
Admission: EM | Admit: 2011-01-27 | Discharge: 2011-01-27 | Disposition: A | Discharge status: Home / Self Care | Payer: Medicare Other | Attending: Emergency Medicine | Admitting: Emergency Medicine

## 2011-01-27 DIAGNOSIS — F17200 Nicotine dependence, unspecified, uncomplicated: Secondary | ICD-10-CM | POA: Insufficient documentation

## 2011-01-27 DIAGNOSIS — R51 Headache: Secondary | ICD-10-CM | POA: Insufficient documentation

## 2011-01-27 DIAGNOSIS — W19XXXA Unspecified fall, initial encounter: Secondary | ICD-10-CM

## 2011-01-27 DIAGNOSIS — W010XXA Fall on same level from slipping, tripping and stumbling without subsequent striking against object, initial encounter: Secondary | ICD-10-CM | POA: Insufficient documentation

## 2011-01-27 DIAGNOSIS — R209 Unspecified disturbances of skin sensation: Secondary | ICD-10-CM | POA: Insufficient documentation

## 2011-01-27 DIAGNOSIS — S0285XA Fracture of orbit, unspecified, initial encounter for closed fracture: Secondary | ICD-10-CM

## 2011-01-27 DIAGNOSIS — S0280XA Fracture of other specified skull and facial bones, unspecified side, initial encounter for closed fracture: Secondary | ICD-10-CM | POA: Insufficient documentation

## 2011-01-27 DIAGNOSIS — Z87828 Personal history of other (healed) physical injury and trauma: Secondary | ICD-10-CM | POA: Insufficient documentation

## 2011-01-27 DIAGNOSIS — Z79899 Other long term (current) drug therapy: Secondary | ICD-10-CM | POA: Insufficient documentation

## 2011-01-27 DIAGNOSIS — H5710 Ocular pain, unspecified eye: Secondary | ICD-10-CM | POA: Insufficient documentation

## 2011-01-27 MED ORDER — ONDANSETRON HCL 4 MG/2ML IJ SOLN
4.0000 mg | Freq: Once | INTRAMUSCULAR | Status: AC
  Administered 2011-01-27: 4 mg via INTRAVENOUS
  Filled 2011-01-27: qty 2

## 2011-01-27 MED ORDER — SODIUM CHLORIDE 0.9 % IV BOLUS (SEPSIS)
1000.0000 mL | Freq: Once | INTRAVENOUS | Status: AC
  Administered 2011-01-27: 1000 mL via INTRAVENOUS

## 2011-01-27 MED ORDER — SODIUM CHLORIDE 0.9 % IV SOLN: INTRAVENOUS | Status: DC

## 2011-01-27 MED ORDER — MORPHINE SULFATE 4 MG/ML IJ SOLN
4.0000 mg | Freq: Once | INTRAMUSCULAR | Status: AC
  Administered 2011-01-27: 4 mg via INTRAVENOUS
  Filled 2011-01-27: qty 1

## 2011-01-27 MED ORDER — OXYCODONE HCL 5 MG PO TABS: 30.0000 mg | ORAL_TABLET | Freq: Four times a day (QID) | ORAL | Status: AC | PRN

## 2011-01-27 NOTE — ED Notes (Signed)
Pt states he fell 3 days ago hitting his face on a drawer that was open,  States he has no feeling on the right side of his face, states he has to force his eye to stay open and has bitten his lip due to no feeling.  States he has had a headache ever since this injury
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
## 2011-01-27 NOTE — ED Notes (Signed)
EDP made aware of pt's condition and symptoms.

## 2011-01-29 ENCOUNTER — Ambulatory Visit: Payer: Medicare Other | Admitting: Urgent Care

## 2011-01-29 ENCOUNTER — Ambulatory Visit: Payer: Medicare Other | Admitting: Gastroenterology

## 2011-02-05 ENCOUNTER — Ambulatory Visit: Payer: Medicare Other | Admitting: Urgent Care

## 2011-02-09 ENCOUNTER — Ambulatory Visit: Payer: Medicare Other | Admitting: Gastroenterology

## 2011-02-15 ENCOUNTER — Emergency Department (HOSPITAL_COMMUNITY): Payer: Medicare Other

## 2011-02-15 ENCOUNTER — Other Ambulatory Visit: Payer: Self-pay

## 2011-02-15 ENCOUNTER — Encounter (HOSPITAL_COMMUNITY): Payer: Self-pay | Admitting: Emergency Medicine

## 2011-02-15 ENCOUNTER — Inpatient Hospital Stay (HOSPITAL_COMMUNITY)
Admission: EM | Admit: 2011-02-15 | Discharge: 2011-02-19 | DRG: 092 | Disposition: A | Discharge status: Home / Self Care | Payer: Medicare Other | Attending: Internal Medicine | Admitting: Internal Medicine

## 2011-02-15 DIAGNOSIS — E1142 Type 2 diabetes mellitus with diabetic polyneuropathy: Secondary | ICD-10-CM | POA: Diagnosis present

## 2011-02-15 DIAGNOSIS — G929 Unspecified toxic encephalopathy: Principal | ICD-10-CM | POA: Diagnosis present

## 2011-02-15 DIAGNOSIS — G894 Chronic pain syndrome: Secondary | ICD-10-CM | POA: Diagnosis present

## 2011-02-15 DIAGNOSIS — R4182 Altered mental status, unspecified: Secondary | ICD-10-CM

## 2011-02-15 DIAGNOSIS — G253 Myoclonus: Secondary | ICD-10-CM | POA: Diagnosis present

## 2011-02-15 DIAGNOSIS — N289 Disorder of kidney and ureter, unspecified: Secondary | ICD-10-CM

## 2011-02-15 DIAGNOSIS — N179 Acute kidney failure, unspecified: Secondary | ICD-10-CM | POA: Diagnosis present

## 2011-02-15 DIAGNOSIS — I10 Essential (primary) hypertension: Secondary | ICD-10-CM | POA: Diagnosis present

## 2011-02-15 DIAGNOSIS — I129 Hypertensive chronic kidney disease with stage 1 through stage 4 chronic kidney disease, or unspecified chronic kidney disease: Secondary | ICD-10-CM | POA: Diagnosis present

## 2011-02-15 DIAGNOSIS — E1149 Type 2 diabetes mellitus with other diabetic neurological complication: Secondary | ICD-10-CM | POA: Diagnosis present

## 2011-02-15 DIAGNOSIS — G92 Toxic encephalopathy: Principal | ICD-10-CM | POA: Diagnosis present

## 2011-02-15 DIAGNOSIS — G4733 Obstructive sleep apnea (adult) (pediatric): Secondary | ICD-10-CM | POA: Diagnosis present

## 2011-02-15 DIAGNOSIS — N390 Urinary tract infection, site not specified: Secondary | ICD-10-CM | POA: Diagnosis present

## 2011-02-15 DIAGNOSIS — Z79899 Other long term (current) drug therapy: Secondary | ICD-10-CM

## 2011-02-15 DIAGNOSIS — E876 Hypokalemia: Secondary | ICD-10-CM | POA: Diagnosis not present

## 2011-02-15 DIAGNOSIS — F17200 Nicotine dependence, unspecified, uncomplicated: Secondary | ICD-10-CM | POA: Diagnosis present

## 2011-02-15 DIAGNOSIS — I5022 Chronic systolic (congestive) heart failure: Secondary | ICD-10-CM | POA: Diagnosis present

## 2011-02-15 DIAGNOSIS — I509 Heart failure, unspecified: Secondary | ICD-10-CM | POA: Diagnosis present

## 2011-02-15 DIAGNOSIS — T50995A Adverse effect of other drugs, medicaments and biological substances, initial encounter: Secondary | ICD-10-CM | POA: Diagnosis present

## 2011-02-15 DIAGNOSIS — G40909 Epilepsy, unspecified, not intractable, without status epilepticus: Secondary | ICD-10-CM

## 2011-02-15 DIAGNOSIS — R251 Tremor, unspecified: Secondary | ICD-10-CM | POA: Diagnosis present

## 2011-02-15 DIAGNOSIS — Z9989 Dependence on other enabling machines and devices: Secondary | ICD-10-CM | POA: Diagnosis present

## 2011-02-15 DIAGNOSIS — D649 Anemia, unspecified: Secondary | ICD-10-CM | POA: Diagnosis present

## 2011-02-15 DIAGNOSIS — R410 Disorientation, unspecified: Secondary | ICD-10-CM | POA: Diagnosis present

## 2011-02-15 DIAGNOSIS — D72829 Elevated white blood cell count, unspecified: Secondary | ICD-10-CM | POA: Diagnosis present

## 2011-02-15 DIAGNOSIS — R892 Abnormal level of other drugs, medicaments and biological substances in specimens from other organs, systems and tissues: Secondary | ICD-10-CM | POA: Diagnosis present

## 2011-02-15 DIAGNOSIS — N183 Chronic kidney disease, stage 3 unspecified: Secondary | ICD-10-CM | POA: Diagnosis present

## 2011-02-15 HISTORY — DX: Other long term (current) drug therapy: Z79.899

## 2011-02-15 HISTORY — DX: Tremor, unspecified: R25.1

## 2011-02-15 LAB — URINALYSIS, ROUTINE W REFLEX MICROSCOPIC
Glucose, UA: NEGATIVE mg/dL
Ketones, ur: NEGATIVE mg/dL
Leukocytes, UA: NEGATIVE
Nitrite: NEGATIVE
Protein, ur: NEGATIVE mg/dL
Urobilinogen, UA: 0.2 mg/dL (ref 0.0–1.0)

## 2011-02-15 LAB — BASIC METABOLIC PANEL
BUN: 54 mg/dL — ABNORMAL HIGH (ref 6–23)
CO2: 31 mEq/L (ref 19–32)
Calcium: 9.8 mg/dL (ref 8.4–10.5)
Chloride: 96 mEq/L (ref 96–112)
Creatinine, Ser: 3.2 mg/dL — ABNORMAL HIGH (ref 0.50–1.35)
Glucose, Bld: 186 mg/dL — ABNORMAL HIGH (ref 70–99)

## 2011-02-15 LAB — BLOOD GAS, ARTERIAL
Acid-Base Excess: 4.6 mmol/L — ABNORMAL HIGH (ref 0.0–2.0)
O2 Content: 2 L/min
pCO2 arterial: 50.1 mmHg — ABNORMAL HIGH (ref 35.0–45.0)
pH, Arterial: 7.386 (ref 7.350–7.450)
pO2, Arterial: 75.4 mmHg — ABNORMAL LOW (ref 80.0–100.0)

## 2011-02-15 LAB — CBC
HCT: 40.7 % (ref 39.0–52.0)
MCH: 28.8 pg (ref 26.0–34.0)
MCHC: 32.7 g/dL (ref 30.0–36.0)
MCV: 88.1 fL (ref 78.0–100.0)
Platelets: 240 10*3/uL (ref 150–400)
RDW: 14.7 % (ref 11.5–15.5)
WBC: 16 10*3/uL — ABNORMAL HIGH (ref 4.0–10.5)

## 2011-02-15 LAB — DIFFERENTIAL
Basophils Absolute: 0.1 10*3/uL (ref 0.0–0.1)
Basophils Relative: 1 % (ref 0–1)
Eosinophils Absolute: 0.3 10*3/uL (ref 0.0–0.7)
Eosinophils Relative: 2 % (ref 0–5)
Monocytes Absolute: 1.3 10*3/uL — ABNORMAL HIGH (ref 0.1–1.0)

## 2011-02-15 LAB — URINE MICROSCOPIC-ADD ON

## 2011-02-15 LAB — T4, FREE: Free T4: 1.22 ng/dL (ref 0.80–1.80)

## 2011-02-15 LAB — RAPID URINE DRUG SCREEN, HOSP PERFORMED
Barbiturates: NOT DETECTED
Benzodiazepines: NOT DETECTED

## 2011-02-15 LAB — GLUCOSE, CAPILLARY
Glucose-Capillary: 192 mg/dL — ABNORMAL HIGH (ref 70–99)
Glucose-Capillary: 167 mg/dL — ABNORMAL HIGH (ref 70–99)

## 2011-02-15 LAB — VITAMIN B12: Vitamin B-12: 627 pg/mL (ref 211–911)

## 2011-02-15 LAB — CARDIAC PANEL(CRET KIN+CKTOT+MB+TROPI)
CK, MB: 4.6 ng/mL — ABNORMAL HIGH (ref 0.3–4.0)
Relative Index: 1.7 (ref 0.0–2.5)
Troponin I: <0.3 ng/mL (ref <0.30)

## 2011-02-15 LAB — AMMONIA: Ammonia: 22 umol/L (ref 11–60)

## 2011-02-15 LAB — ETHANOL: Alcohol, Ethyl (B): <11 mg/dL (ref 0–11)

## 2011-02-15 MED ORDER — FUROSEMIDE 10 MG/ML IJ SOLN
20.0000 mg | Freq: Every day | INTRAMUSCULAR | Status: DC
  Administered 2011-02-16: 20 mg via INTRAVENOUS
  Filled 2011-02-15: qty 2

## 2011-02-15 MED ORDER — MORPHINE SULFATE 2 MG/ML IJ SOLN
2.0000 mg | INTRAMUSCULAR | Status: DC | PRN
  Administered 2011-02-15 – 2011-02-19 (×6): 2 mg via INTRAVENOUS
  Filled 2011-02-15 (×6): qty 1

## 2011-02-15 MED ORDER — AMLODIPINE BESYLATE 5 MG PO TABS
2.5000 mg | ORAL_TABLET | Freq: Three times a day (TID) | ORAL | Status: DC
  Administered 2011-02-15 – 2011-02-17 (×7): 2.5 mg via ORAL
  Administered 2011-02-18: 5 mg via ORAL
  Administered 2011-02-18 – 2011-02-19 (×3): 2.5 mg via ORAL
  Filled 2011-02-15 (×11): qty 1

## 2011-02-15 MED ORDER — INSULIN GLARGINE 100 UNIT/ML ~~LOC~~ SOLN
SUBCUTANEOUS | Status: AC
  Filled 2011-02-15: qty 3

## 2011-02-15 MED ORDER — KCL IN DEXTROSE-NACL 20-5-0.9 MEQ/L-%-% IV SOLN
INTRAVENOUS | Status: DC
  Administered 2011-02-15 – 2011-02-18 (×3): via INTRAVENOUS
  Filled 2011-02-15 (×3): qty 1000

## 2011-02-15 MED ORDER — NITROGLYCERIN 0.4 MG SL SUBL: 0.4000 mg | SUBLINGUAL_TABLET | SUBLINGUAL | Status: DC | PRN

## 2011-02-15 MED ORDER — LORAZEPAM 2 MG/ML IJ SOLN
1.0000 mg | Freq: Once | INTRAMUSCULAR | Status: AC
  Administered 2011-02-15: 1 mg via INTRAVENOUS
  Filled 2011-02-15: qty 1

## 2011-02-15 MED ORDER — KCL IN DEXTROSE-NACL 20-5-0.9 MEQ/L-%-% IV SOLN: INTRAVENOUS | Status: DC

## 2011-02-15 MED ORDER — SODIUM CHLORIDE 0.9 % IJ SOLN
INTRAMUSCULAR | Status: AC
  Administered 2011-02-15: 22:00:00
  Filled 2011-02-15: qty 10

## 2011-02-15 MED ORDER — ACETAMINOPHEN 325 MG PO TABS: 650.0000 mg | ORAL_TABLET | Freq: Four times a day (QID) | ORAL | Status: DC | PRN

## 2011-02-15 MED ORDER — INSULIN ASPART 100 UNIT/ML ~~LOC~~ SOLN
0.0000 [IU] | SUBCUTANEOUS | Status: DC
  Administered 2011-02-15: 2 [IU] via SUBCUTANEOUS
  Administered 2011-02-16: 5 [IU] via SUBCUTANEOUS
  Administered 2011-02-16: 2 [IU] via SUBCUTANEOUS
  Administered 2011-02-16: 3 [IU] via SUBCUTANEOUS
  Administered 2011-02-16 (×3): 2 [IU] via SUBCUTANEOUS
  Administered 2011-02-17: 5 [IU] via SUBCUTANEOUS
  Administered 2011-02-17: 1 [IU] via SUBCUTANEOUS
  Administered 2011-02-17: 2 [IU] via SUBCUTANEOUS
  Administered 2011-02-17 (×2): 1 [IU] via SUBCUTANEOUS
  Administered 2011-02-17: 3 [IU] via SUBCUTANEOUS
  Administered 2011-02-17 – 2011-02-19 (×4): 2 [IU] via SUBCUTANEOUS
  Administered 2011-02-19: 1 [IU] via SUBCUTANEOUS
  Administered 2011-02-19: 2 [IU] via SUBCUTANEOUS
  Filled 2011-02-15: qty 10

## 2011-02-15 MED ORDER — FAMOTIDINE IN NACL 20-0.9 MG/50ML-% IV SOLN
20.0000 mg | INTRAVENOUS | Status: DC
  Filled 2011-02-15: qty 50

## 2011-02-15 MED ORDER — ACETAMINOPHEN 650 MG RE SUPP: 650.0000 mg | Freq: Four times a day (QID) | RECTAL | Status: DC | PRN

## 2011-02-15 MED ORDER — ONDANSETRON HCL 4 MG PO TABS: 4.0000 mg | ORAL_TABLET | Freq: Four times a day (QID) | ORAL | Status: DC | PRN

## 2011-02-15 MED ORDER — INSULIN ASPART 100 UNIT/ML ~~LOC~~ SOLN
SUBCUTANEOUS | Status: AC
  Filled 2011-02-15: qty 3

## 2011-02-15 MED ORDER — METOPROLOL TARTRATE 1 MG/ML IV SOLN
5.0000 mg | Freq: Four times a day (QID) | INTRAVENOUS | Status: DC
  Administered 2011-02-16 (×2): 5 mg via INTRAVENOUS
  Filled 2011-02-15 (×3): qty 5

## 2011-02-15 MED ORDER — INSULIN GLARGINE 100 UNIT/ML ~~LOC~~ SOLN
10.0000 [IU] | Freq: Every day | SUBCUTANEOUS | Status: DC
  Administered 2011-02-15 – 2011-02-16 (×2): 10 [IU] via SUBCUTANEOUS
  Filled 2011-02-15: qty 10

## 2011-02-15 MED ORDER — ALBUTEROL SULFATE (5 MG/ML) 0.5% IN NEBU: 2.5000 mg | INHALATION_SOLUTION | RESPIRATORY_TRACT | Status: DC | PRN

## 2011-02-15 MED ORDER — ONDANSETRON HCL 4 MG/2ML IJ SOLN: 4.0000 mg | Freq: Four times a day (QID) | INTRAMUSCULAR | Status: DC | PRN

## 2011-02-15 MED ORDER — LORAZEPAM 2 MG/ML IJ SOLN
1.0000 mg | Freq: Four times a day (QID) | INTRAMUSCULAR | Status: DC | PRN
  Administered 2011-02-15: 1 mg via INTRAVENOUS
  Filled 2011-02-15: qty 1

## 2011-02-15 NOTE — ED Notes (Signed)
Per wife patient started having multiple seizures yesterday around 2 in the afternoon and began having tremors all through the night while sleeping. Wife describes patient to be in a postictal state upon waking this morning. Wife states "I couldn't hardly wake him this morning. He peed in the bed." Patient has a hx of seizures.

## 2011-02-15 NOTE — ED Notes (Signed)
Wife reports patient being admitted to ICU here for his seizures before. Wife states  "He has them all the time. His neurologist is in [facility]." Wife also reports patient having two prior falls from seizures in which he fractured the orbit around is eye and broke a toe on his right foot.

## 2011-02-15 NOTE — ED Provider Notes (Signed)
Larry Jakes, MD  Medical screening examination/treatment/procedure(s) were conducted as a shared visit with non-physician practitioner(s) and myself.  I personally evaluated the patient during the encounter  Patient seen by me. She will require admission for altered mental status of unknown etiology workup in ED negative except for elevated white count. Patient with history of possible seizures. Altered metal status may be related to that or maybe related to the Ativan that he got in the emergency department. Is not clear if he has a true history of seizures or not. Earlier in ED the patient was alert awake and acting normal. He was experiencing tremors but was awake when these were ongoing.   Larry Jakes, MD 02/15/11 [phone]

## 2011-02-15 NOTE — H&P (Signed)
[name] MRN: [id number] DOB/AGE: 1955/12/06 55 y.o. Primary Care Physician:DANCEL,REX, MD, MD Admit date: 02/15/2011 Chief Complaint: Confusion, somnolence, ongoing worsening tremor. HPI: The patient is a 55 year old man with a past medical history significant for chronic systolic congestive heart failure with ejection fraction of 40-45%, erosive esophagitis, colon polyps, type 2 diabetes mellitus, and chronic kidney disease. 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 Evitts. The patient is currently lethargic and is unable to stay awake long enough to provide any history. The history has been provided by his wife. Accordingly, the patient was in his usual state of health up until last night. Both he and Mrs. Verdell or cutting wood yesterday. After cutting wood, he became fatigued and took a nap at approximately 3:00 PM. An approximately 9 PM last night, he was still asleep. She has a difficult time arousing him. Therefore, he just remained in bed. Overnight, he fell out of the bed twice but there was no head trauma according to Mrs. Gibbs. She was able to assist back to bed. Over the past 12-18 hours, he has been too lethargic to ambulate order to even carry on a conversation. She has not noticed any focal unilateral weakness. She has not noticed any facial droop. He does slur a little when he speaks. His blood sugars at home been in the 140s to 200s. His review of systems is globally positive when Mrs. Grismer is questioned.  The emergency department, he is lethargic. He is hemodynamically stable and afebrile. The CT of his head reveals no acute findings. His EKG reveals normal sinus rhythm with a heart rate of 83 beats per minute and Q waves in the septal leads. His lab data are significant for a BUN of 54, creatinine of 3.20, WBC of 16.0, glucose of 192, alcohol level of less than 11, and a negative urine drug screen.    Past Medical History  Diagnosis  Date  . Diabetes mellitus   . Hypertension   . CHF (congestive heart failure)   . CRD (chronic renal disease)   . Arthritis   . Obesity   . Depression   . Sleep apnea   . Diabetic neuropathy   . CAD (coronary artery disease)   . Mitral regurgitation   . Esophagitis, reflux 07/07/10    MW tear, erosive reflux esophagitis, 2cm hh  . S/P colonoscopy April 2012    [place]: torturous colon, hyperplastic polyps  . Collagen vascular disease     "myoclonic "  . Tremor     Past Surgical History  Procedure Date  . Neck surgery   . Hand surgery     bilateral  . Colonoscopy before 2002    [place]  . Appendectomy     Prior to Admission medications   Medication Sig Start Date End Date Taking? Authorizing Provider  amLODipine (NORVASC) 2.5 MG tablet Take 2.5 mg by mouth 3 (three) times daily.    Yes Historical Provider, MD  baclofen (LIORESAL) 10 MG tablet Take 10 mg by mouth 4 (four) times daily.  07/08/10  Yes Historical Provider, MD  clonazePAM (KLONOPIN) 0.5 MG tablet Take 0.5 mg by mouth 2 (two) times daily.    Yes Historical Provider, MD  docusate sodium (COLACE) 100 MG capsule Take 100-200 mg by mouth 2 (two) times daily. Take 2 tablets every morning and 1 tablet at bedtime   Yes Historical Provider, MD  FLUoxetine (PROZAC) 40 MG capsule Take 40 mg by  mouth daily.  07/09/10  Yes Historical Provider, MD  furosemide (LASIX) 40 MG tablet Take 40-80 mg by mouth 2 (two) times daily. Patient takes 80mg  every morning and then 40mg  in the evening if needed for fluid 07/08/10  Yes Historical Provider, MD  gabapentin (NEURONTIN) 400 MG capsule Take 400 mg by mouth 3 (three) times daily. Takes 1 tablet twice daily then an additional tablet in the evening if needed for diabetic neuropathy 07/08/10  Yes Historical Provider, MD  hydrOXYzine (ATARAX/VISTARIL) 10 MG tablet Take 10 mg by mouth 2 (two) times daily.    Yes Historical Provider, MD  ibuprofen (ADVIL,MOTRIN) 200 MG tablet Take 400-600 mg by mouth  as needed. For pain   Yes Historical Provider, MD  insulin NPH-insulin regular (NOVOLIN 70/30) (70-30) 100 UNIT/ML injection Inject 50-100 Units into the skin 2 (two) times daily. 55 units every morning and 100 units at bedtime   Yes Historical Provider, MD  losartan (COZAAR) 100 MG tablet Take 50 mg by mouth at bedtime.  07/08/10  Yes Historical Provider, MD  metoprolol succinate (TOPROL-XL) 25 MG 24 hr tablet Take 12.5 tablets by mouth at bedtime.  06/11/10  Yes Historical Provider, MD  Multiple Vitamins-Minerals (CENTRUM SILVER ULTRA MENS) TABS Take 1 tablet by mouth daily.     Yes Historical Provider, MD  nitroGLYCERIN (NITROSTAT) 0.4 MG SL tablet Place 0.4 mg under the tongue every 5 (five) minutes x 3 doses as needed. For chest pain   Yes Historical Provider, MD  oxycodone (ROXICODONE) 30 MG immediate release tablet Take 30 mg by mouth every 6 (six) hours.  07/20/10  Yes Historical Provider, MD  rOPINIRole (REQUIP) 4 MG tablet Take 4 mg by mouth at bedtime.     Yes Historical Provider, MD  simvastatin (ZOCOR) 40 MG tablet Take 20 mg by mouth at bedtime.  07/08/10  Yes Historical Provider, MD  spironolactone (ALDACTONE) 25 MG tablet Take 25 mg by mouth at bedtime.  07/08/10  Yes Historical Provider, MD  traZODone (DESYREL) 100 MG tablet Take 100 mg by mouth At bedtime.  07/08/10  Yes Historical Provider, MD  nicotine (NICODERM CQ - DOSED IN MG/24 HOURS) 21 mg/24hr patch Place 1 patch onto the skin daily.      Historical Provider, MD  ondansetron (ZOFRAN) 4 MG tablet Take 4 mg by mouth every 8 (eight) hours as needed. For nausea  12/09/10   Gerrit Halls, NP  pantoprazole (PROTONIX) 40 MG tablet Take 1 tablet by mouth every morning.  07/10/10   Historical Provider, MD  rOPINIRole (REQUIP) 2 MG tablet Take 4 mg by mouth at bedtime.      Historical Provider, MD  triamcinolone (KENALOG) 0.5 % cream Apply 1 application topically.  11/27/10   Historical Provider, MD    Allergies: No Known Allergies  Family  History  Problem Relation Age of Onset  . Stroke Father 32  . Stroke Mother 46  . Colon cancer Neg Hx   . Crohn's disease Neg Hx   . Cirrhosis Neg Hx   . Ulcerative colitis Neg Hx   . Stomach cancer Neg Hx     Social History: He is married. [place] He has one living daughter. He receives disability benefits. He smokes a half a pack of cigarettes per day but has a 40-pack-year history. He does not drink alcohol. No illicit drug use.     ROS: Positive for chronic fatigue, chronic headaches, chronic dizziness, chronic tremor, nausea, vomiting, abdominal  pain, arthritic pain, urinary hesitancy, urinary frequency, pain with urination, constipation, occasional chest pain, shortness of breath, occasional swelling in his legs, right great toe pain.  PHYSICAL EXAM: Blood pressure 127/62, pulse 79, temperature 97.4 F (36.3 C), temperature source Oral, resp. rate 15, height 5\' 11"  (1.803 m), weight 120.203 kg (265 lb), SpO2 98.00%.  General: The patient is a obese 55 year old Caucasian man who appears older than his stated age. He is lethargic, bordering on obtundation. He is protecting his airway. HEENT: Healing bruise over the right eye. Otherwise head is normocephalic. Pupils are equal, round, and reactive to light. Extraocular movements are difficult to assess as the patient keeps closing his eyes. No conjunctival icterus. Sclerae are clear. Tympanic membranes difficult to examine. Oropharynx reveals dry mucous membranes. No posterior exudates or erythema. Neck: Obese, supple, no adenopathy. Lungs: He snores while he falls back to sleep. Decreased breath sounds in the bases. Clear anteriorly. Heart: Distant S1, S2. Abdomen: Morbidly obese, positive bowel sounds, nontender, nondistended. GU and rectal are deferred. Extremities: Pedal pulses barely palpable. No pedal edema. Neurologic: He is globally lethargic. He opens his eyes to voice and tries to follow short  commands, but then he falls back to sleep. He does have a mild tremor of his arms and face and no obvious leg tremor. The tremor is not in a tonic-clonic rhythm.  Basic Metabolic Panel:  Basename 02/15/11 1135  NA 139  K 3.6  CL 96  CO2 31  GLUCOSE 186*  BUN 54*  CREATININE 3.20*  CALCIUM 9.8  MG --  PHOS --   Liver Function Tests: No results found for this basename: AST:2,ALT:2,ALKPHOS:2,BILITOT:2,PROT:2,ALBUMIN:2 in the last 72 hours No results found for this basename: LIPASE:2,AMYLASE:2 in the last 72 hours No results found for this basename: AMMONIA:2 in the last 72 hours CBC:  Basename 02/15/11 1030  WBC 16.0*  NEUTROABS 11.7*  HGB 13.3  HCT 40.7  MCV 88.1  PLT 240   Cardiac Enzymes: No results found for this basename: CKTOTAL:3,CKMB:3,CKMBINDEX:3,TROPONINI:3 in the last 72 hours BNP: No results found for this basename: POCBNP:3 in the last 72 hours D-Dimer: No results found for this basename: DDIMER:2 in the last 72 hours CBG:  Basename 02/15/11 1117  GLUCAP 192*   Hemoglobin A1C: No results found for this basename: HGBA1C in the last 72 hours Fasting Lipid Panel: No results found for this basename: CHOL,HDL,LDLCALC,TRIG,CHOLHDL,LDLDIRECT in the last 72 hours Thyroid Function Tests: No results found for this basename: TSH,T4TOTAL,FREET4,T3FREE,THYROIDAB in the last 72 hours Anemia Panel: No results found for this basename: VITAMINB12,FOLATE,FERRITIN,TIBC,IRON,RETICCTPCT in the last 72 hours Urine Drug Screen:  Negative Alcohol Level:  Basename 02/15/11 1135  ETH <11      No results found for this or any previous visit (from the past 240 hour(s)).   Results for orders placed during the hospital encounter of 02/15/11 (from the past 48 hour(s))  CBC     Status: Abnormal   Collection Time   02/15/11 10:30 AM      Component Value Range Comment   WBC 16.0 (*) 4.0 - 10.5 (K/uL)    RBC 4.62  4.22 - 5.81 (MIL/uL)    Hemoglobin 13.3  13.0 - 17.0 (g/dL)     HCT 29.5  62.1 - 30.8 (%)    MCV 88.1  78.0 - 100.0 (fL)    MCH 28.8  26.0 - 34.0 (pg)    MCHC 32.7  30.0 - 36.0 (g/dL)    RDW 65.7  84.6 -  15.5 (%)    Platelets 240  150 - 400 (K/uL)   DIFFERENTIAL     Status: Abnormal   Collection Time   02/15/11 10:30 AM      Component Value Range Comment   Neutrophils Relative 73  43 - 77 (%)    Neutro Abs 11.7 (*) 1.7 - 7.7 (K/uL)    Lymphocytes Relative 17  12 - 46 (%)    Lymphs Abs 2.7  0.7 - 4.0 (K/uL)    Monocytes Relative 8  3 - 12 (%)    Monocytes Absolute 1.3 (*) 0.1 - 1.0 (K/uL)    Eosinophils Relative 2  0 - 5 (%)    Eosinophils Absolute 0.3  0.0 - 0.7 (K/uL)    Basophils Relative 1  0 - 1 (%)    Basophils Absolute 0.1  0.0 - 0.1 (K/uL)   GLUCOSE, CAPILLARY     Status: Abnormal   Collection Time   02/15/11 11:17 AM      Component Value Range Comment   Glucose-Capillary 192 (*) 70 - 99 (mg/dL)   BASIC METABOLIC PANEL     Status: Abnormal   Collection Time   02/15/11 11:35 AM      Component Value Range Comment   Sodium 139  135 - 145 (mEq/L)    Potassium 3.6  3.5 - 5.1 (mEq/L)    Chloride 96  96 - 112 (mEq/L)    CO2 31  19 - 32 (mEq/L)    Glucose, Bld 186 (*) 70 - 99 (mg/dL)    BUN 54 (*) 6 - 23 (mg/dL)    Creatinine, Ser 7.82 (*) 0.50 - 1.35 (mg/dL)    Calcium 9.8  8.4 - 10.5 (mg/dL)    GFR calc non Af Amer 20 (*) >90 (mL/min)    GFR calc Af Amer 23 (*) >90 (mL/min)   ETHANOL     Status: Normal   Collection Time   02/15/11 11:35 AM      Component Value Range Comment   Alcohol, Ethyl (B) <11  0 - 11 (mg/dL)   URINE RAPID DRUG SCREEN (HOSP PERFORMED)     Status: Normal   Collection Time   02/15/11  2:11 PM      Component Value Range Comment   Opiates NONE DETECTED  NONE DETECTED     Cocaine NONE DETECTED  NONE DETECTED     Benzodiazepines NONE DETECTED  NONE DETECTED     Amphetamines NONE DETECTED  NONE DETECTED     Tetrahydrocannabinol NONE DETECTED  NONE DETECTED     Barbiturates NONE DETECTED  NONE DETECTED       Ct Head Wo Contrast  02/15/2011  *RADIOLOGY REPORT*  Clinical Data: Seizures.  CT HEAD WITHOUT CONTRAST  Technique:  Contiguous axial images were obtained from the base of the skull through the vertex without contrast.  Comparison: Head CT scan 01/27/2011.  Findings: The brain appears normal without evidence of acute infarction, hemorrhage, mass lesion, mass effect, midline shift or abnormal extra-axial fluid collection.  No hydrocephalus or pneumocephalus.  Mucous retention cyst or polyp right maxillary sinus noted.  IMPRESSION: No acute finding.  Original Report Authenticated By: Bernadene Bell. Maricela Curet, M.D.   Ct Head Wo Contrast  01/27/2011  *RADIOLOGY REPORT*  Clinical Data:  Headache, post fall, right infraorbital pain  CT HEAD WITHOUT CONTRAST CT MAXILLOFACIAL WITHOUT CONTRAST  Technique:  Multidetector CT imaging of the head and maxillofacial structures were performed using the standard protocol without intravenous contrast. Multiplanar  CT image reconstructions of the maxillofacial structures were also generated.  Comparison:  03/04/2007; brain MRI - 11/23/2008  CT HEAD  Findings: Wallace Cullens white differentiation is maintained.  No CT evidence of acute large territory infarct.  No intraparenchymal or extra- axial mass or hemorrhage.  Normal size and configuration of the ventricles and basilar cisterns.  No midline shift.  Minimal vascular calcifications within the carotid siphons.  No calvarial fracture.  Regional soft tissues are normal.  IMPRESSION: Normal noncontrast head CT.  CT MAXILLOFACIAL  Findings:   There is a minimally displaced fracture of the inferior wall of the right orbit which appears to extend to involve the infraorbital foramen.  This finding is associated mucosal thickening within the right maxillary sinus.  No air fluid level. There is no definite entrapment of the inferior rectus. The globe is intact.  No retrobulbar hematoma.  Regional soft tissues are normal.  No radiopaque foreign  body.  No other fractures are identified.  There is minimal rightward deviation of the nasal septum.  The remaining paranasal sinuses and mastoid air cells are normal.  The bilateral pterygoid plates are normal.  Limited visualization of the cervical spine and demonstrates C4 ACDF, incompletely imaged.  The dens is normally seated and the lateral masses of C1.  Normal atlanto-odontoid atlantoaxial articulations.  Imaged intracranial structures are normal.  Calcifications within the carotid siphons.  IMPRESSION: Minimally displaced fracture the inferior wall of the right orbit with potential involvement of the infraorbital foramen.  No definite entrapment of the inferior rectus musculature.  Above findings discussed with Dr. Lynelle Doctor at [phone].  Original Report Authenticated By: Waynard Reeds, M.D.   Dg Toe Great Right  02/15/2011  *RADIOLOGY REPORT*  Clinical Data: Trauma, pain.  RIGHT TOE - 2+ VIEW  Comparison: None.  Findings: Bony fragment is seen off the base of the proximal phalanx of the great toe.  The appearances most suggestive of old trauma.  No definite acute fracture is identified.  There is no dislocation.  The patient has advanced first MTP degenerative disease.  IMPRESSION: Findings most suggestive of old trauma at the base of the proximal phalanx of the great toe.  No definite acute fracture.  Advanced first MTP degenerative disease noted.  Original Report Authenticated By: Bernadene Bell. Maricela Curet, M.D.   Ct Maxillofacial Wo Cm  01/27/2011  *RADIOLOGY REPORT*  Clinical Data:  Headache, post fall, right infraorbital pain  CT HEAD WITHOUT CONTRAST CT MAXILLOFACIAL WITHOUT CONTRAST  Technique:  Multidetector CT imaging of the head and maxillofacial structures were performed using the standard protocol without intravenous contrast. Multiplanar CT image reconstructions of the maxillofacial structures were also generated.  Comparison:  03/04/2007; brain MRI - 11/23/2008  CT HEAD  Findings: Wallace Cullens white  differentiation is maintained.  No CT evidence of acute large territory infarct.  No intraparenchymal or extra- axial mass or hemorrhage.  Normal size and configuration of the ventricles and basilar cisterns.  No midline shift.  Minimal vascular calcifications within the carotid siphons.  No calvarial fracture.  Regional soft tissues are normal.  IMPRESSION: Normal noncontrast head CT.  CT MAXILLOFACIAL  Findings:   There is a minimally displaced fracture of the inferior wall of the right orbit which appears to extend to involve the infraorbital foramen.  This finding is associated mucosal thickening within the right maxillary sinus.  No air fluid level. There is no definite entrapment of the inferior rectus. The globe is intact.  No retrobulbar hematoma.  Regional soft  tissues are normal.  No radiopaque foreign body.  No other fractures are identified.  There is minimal rightward deviation of the nasal septum.  The remaining paranasal sinuses and mastoid air cells are normal.  The bilateral pterygoid plates are normal.  Limited visualization of the cervical spine and demonstrates C4 ACDF, incompletely imaged.  The dens is normally seated and the lateral masses of C1.  Normal atlanto-odontoid atlantoaxial articulations.  Imaged intracranial structures are normal.  Calcifications within the carotid siphons.  IMPRESSION: Minimally displaced fracture the inferior wall of the right orbit with potential involvement of the infraorbital foramen.  No definite entrapment of the inferior rectus musculature.  Above findings discussed with Dr. Lynelle Doctor at [phone].  Original Report Authenticated By: Waynard Reeds, M.D.    Impression:  Active Problems:  Delirium  Tremor  ARF (acute renal failure)  CKD (chronic kidney disease), stage III  Leukocytosis  OSA on CPAP  Chronic systolic heart failure  HTN (hypertension)  DM type 2 with diabetic peripheral neuropathy  Polypharmacy  1. Delirium. His delirium could be  multifactorial, including polypharmacy, medication withdrawal, underlying infection, stroke, rule out hepatic encephalopathy, rule out hypoxemia or hypercapnia, or seizure. The patient has no known seizure disorder, but rather, "myoclonic tremors" according to his wife. He is on a lot of psychotropic medications. His urine drug screen is surprisingly negative for benzodiazepines.  Acute renal failure superimposed on stage III chronic kidney disease. His baseline creatinine is approximately 2.5. He may be a little volume depleted.  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 He is followed by a neurologist, Dr. Zola Button, in [place].  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  Chronic systolic congestive heart failure. Clinically this appears to be compensated.  Hypertension. Currently stable.    Plan:  1. The patient received approximately a liter and a half of IV fluids in the ED. We will continue gentle IV fluids with D5 normal saline with potassium chloride added at 60 cc per hour. The rate may be adjusted according to further clinical data.  We will keep the patient n.p.o. He only medication he is going to be permitted, if he is alert enough, his Neurontin, to avoid potential withdrawal seizures.  Will get his metoprolol IV for now. Will hold his Lasix while he is being hydrated. Will restart the Lasix IV tomorrow. We'll order Ativan IV when necessary for tremor.  Treated diabetes with sliding scale insulin and Lantus for now.  For further evaluation, we will order MRI of his brain, cardiac enzymes, thyroid function tests, vitamin B 12, RPR, urinalysis, ammonia level, ABG, chest x-ray, EEG. We will consult neurologist, Dr. Gerilyn Pilgrim. Of note, Dr. Maryln Gottron office number is [phone].  Neuro checks every 4 hours for the next 36 hours.       Banks Chaikin 02/15/2011, 5:21 PM

## 2011-02-15 NOTE — ED Notes (Signed)
MD at bedside. Dr Chandra Batch at bedside speaking to pt.

## 2011-02-15 NOTE — ED Notes (Signed)
Pt remains on 2 LPM [place] with oxygen sats 100 at this time.  Family member not at bedside at this time.

## 2011-02-15 NOTE — ED Notes (Signed)
Pt's spouse out to nursing station, she was tearful stating "I want someone to come see my husband, no one has been in to see him and I know he has had a stroke, get the doctor".  Family member became more anxious.  J Idol PA-C spoke with spouse at desk and in pt room. Pt placed on cardiac and oxygen monitoring.

## 2011-02-15 NOTE — ED Provider Notes (Signed)
History     CSN: [id number] Arrival date & time: 02/15/2011  9:27 AM   First MD Initiated Contact with Patient 02/15/11 0935      Chief Complaint  Patient presents with  . Seizures    (Consider location/radiation/quality/duration/timing/severity/associated sxs/prior treatment) HPI Comments: Patient and wife describes intermittent seizures since approximately 2 pm yesterday.  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  He takes only clonazepam as treatment and his last dose taken was last night.  Wife states his seizures were lasting about 15 minutes and had one 1 to 2 per hour for most of the day.  Wife states he did not seize overnight,  But he had a tremor that persisted all night long.  This morning she was unable to wake him for about 15 minutes, then he slowly became more awake and aware of his surroundings.  He did have urinary incontinence this morning.   Patient is a 55 y.o. male presenting with seizures. The history is provided by the patient and the spouse.  Seizures  This is a recurrent problem. The current episode started yesterday. The problem has not changed since onset.There were 6 to 10 seizures. The most recent episode lasted more than 5 minutes. Associated symptoms include sleepiness and confusion. Pertinent negatives include no headaches, no visual disturbance, no neck stiffness, no sore throat, no chest pain, no cough, no nausea and no vomiting. Characteristics include rhythmic jerking and loss of consciousness. The episode was witnessed. There was no sensation of an aura present. The seizures did not continue in the ED. The seizure(s) had no focality. Possible causes do not include med or dosage change. There has been no fever. There were no medications administered prior to arrival.    Past Medical History  Diagnosis Date  . Diabetes mellitus   . Hypertension   . CHF (congestive heart failure)    . CRD (chronic renal disease)   . Arthritis   . Obesity   . Depression   . Sleep apnea   . Diabetic neuropathy   . CAD (coronary artery disease)   . Mitral regurgitation   . Esophagitis, reflux 07/07/10    MW tear, erosive reflux esophagitis, 2cm hh  . S/P colonoscopy April 2012    Rourk: torturous colon, hyperplastic polyps  . Collagen vascular disease     "myoclonic "  . Tremor     Past Surgical History  Procedure Date  . Neck surgery   . Hand surgery     bilateral  . Colonoscopy before 2002    [place]  . Appendectomy     Family History  Problem Relation Age of Onset  . Stroke Father 54  . Stroke Mother 30  . Colon cancer Neg Hx   . Crohn's disease Neg Hx   . Cirrhosis Neg Hx   . Ulcerative colitis Neg Hx   . Stomach cancer Neg Hx     History  Substance Use Topics  . Smoking status: Current Everyday Smoker -- 1.0 packs/day for 40 years    Types: Cigarettes  . Smokeless tobacco: Never Used   Comment: ordered nicotine patches. start date is 27 July 2010  . Alcohol Use: No      Review of Systems  Constitutional: Negative for fever.  HENT: Negative for congestion, sore throat, rhinorrhea and neck pain.   Eyes: Negative.  Negative for photophobia, pain and visual disturbance.  Respiratory: Negative for cough, chest  tightness and shortness of breath.   Cardiovascular: Negative for chest pain.  Gastrointestinal: Negative for nausea, vomiting and abdominal pain.  Genitourinary: Negative.   Musculoskeletal: Negative for joint swelling and arthralgias.  Skin: Negative.  Negative for rash and wound.  Neurological: Positive for seizures and loss of consciousness. Negative for dizziness, weakness, light-headedness, numbness and headaches.  Hematological: Negative.   Psychiatric/Behavioral: Positive for confusion.    Allergies  Review of patient's allergies indicates no known allergies.  Home Medications   Current Outpatient Rx  Name Route Sig Dispense Refill   . AMLODIPINE BESYLATE 2.5 MG PO TABS Oral Take 2.5 mg by mouth 3 (three) times daily.     Marland Kitchen BACLOFEN 10 MG PO TABS Oral Take 10 mg by mouth 4 (four) times daily.     Marland Kitchen CLONAZEPAM 0.5 MG PO TABS Oral Take 0.5 mg by mouth 2 (two) times daily.     Marland Kitchen DOCUSATE SODIUM 100 MG PO CAPS Oral Take 100-200 mg by mouth 2 (two) times daily. Take 2 tablets every morning and 1 tablet at bedtime    . FLUOXETINE HCL 40 MG PO CAPS Oral Take 40 mg by mouth daily.     . FUROSEMIDE 40 MG PO TABS Oral Take 40-80 mg by mouth 2 (two) times daily. Patient takes 80mg  every morning and then 40mg  in the evening if needed for fluid    . GABAPENTIN 400 MG PO CAPS Oral Take 400 mg by mouth 3 (three) times daily. Takes 1 tablet twice daily then an additional tablet in the evening if needed for diabetic neuropathy    . HYDROXYZINE HCL 10 MG PO TABS Oral Take 10 mg by mouth 2 (two) times daily.     . IBUPROFEN 200 MG PO TABS Oral Take 400-600 mg by mouth as needed. For pain    . INSULIN ISOPHANE & REGULAR (70-30) 100 UNIT/ML [place] SUSP Subcutaneous Inject 50-100 Units into the skin 2 (two) times daily. 55 units every morning and 100 units at bedtime    . LOSARTAN POTASSIUM 100 MG PO TABS Oral Take 50 mg by mouth at bedtime.     Marland Kitchen METOPROLOL SUCCINATE 25 MG PO TB24 Oral Take 12.5 tablets by mouth at bedtime.     . CENTRUM SILVER ULTRA MENS PO TABS Oral Take 1 tablet by mouth daily.      Marland Kitchen NITROGLYCERIN 0.4 MG SL SUBL Sublingual Place 0.4 mg under the tongue every 5 (five) minutes x 3 doses as needed. For chest pain    . OXYCODONE HCL 30 MG PO TABS Oral Take 30 mg by mouth every 6 (six) hours.     Marland Kitchen ROPINIROLE HCL 4 MG PO TABS Oral Take 4 mg by mouth at bedtime.      Marland Kitchen SIMVASTATIN 40 MG PO TABS Oral Take 20 mg by mouth at bedtime.     . SPIRONOLACTONE 25 MG PO TABS Oral Take 25 mg by mouth at bedtime.     . TRAZODONE HCL 100 MG PO TABS Oral Take 100 mg by mouth At bedtime.     Marland Kitchen NICOTINE 21 MG/24HR TD PT24 Transdermal Place 1 patch  onto the skin daily.      Marland Kitchen ONDANSETRON HCL 4 MG PO TABS Oral Take 4 mg by mouth every 8 (eight) hours as needed. For nausea     . PANTOPRAZOLE SODIUM 40 MG PO TBEC Oral Take 1 tablet by mouth every morning.     Marland Kitchen ROPINIROLE HCL 2 MG PO  TABS Oral Take 4 mg by mouth at bedtime.      . TRIAMCINOLONE ACETONIDE 0.5 % EX CREA Topical Apply 1 application topically.       BP 113/62  Pulse 87  Temp(Src) 97.4 F (36.3 C) (Oral)  Resp 23  Ht 5\' 11"  (1.803 m)  Wt 265 lb (120.203 kg)  BMI 36.96 kg/m2  SpO2 96%  Physical Exam  Nursing note and vitals reviewed. Constitutional: He is oriented to person, place, and time. He appears well-developed and well-nourished.  HENT:  Head: Normocephalic and atraumatic.  Eyes: Conjunctivae are normal.  Neck: Normal range of motion.  Cardiovascular: Normal rate, regular rhythm, normal heart sounds and intact distal pulses.   Pulmonary/Chest: Effort normal and breath sounds normal. He has no wheezes.  Abdominal: Soft. Bowel sounds are normal. There is no tenderness.  Musculoskeletal: Normal range of motion.  Neurological: He is alert and oriented to person, place, and time. No cranial nerve deficit. Coordination normal.       Patient with resting tremor in upper extremities.  Skin: Skin is warm and dry.  Psychiatric: He has a normal mood and affect.    ED Course  Procedures (including critical care time)  Patient given IV fluids,  Ativan 1 mg IV to prevent further seizure activity.  At re-exam,  Patient somnolent,  Deep snoring respirations.  Wakes briefly with sternal rub.  Wife at bedside reports he was like this "all last night".  Stable on monitor.  No hypoxia.    Patient still very somnolent 4 hours after receiving ativan.  No seizure activity while in ed.    Labs Reviewed  BASIC METABOLIC PANEL - Abnormal; Notable for the following:    Glucose, Bld 186 (*)    BUN 54 (*)    Creatinine, Ser 3.20 (*)    GFR calc non Af Amer 20 (*)    GFR calc Af  Amer 23 (*)    All other components within normal limits  CBC - Abnormal; Notable for the following:    WBC 16.0 (*)    All other components within normal limits  DIFFERENTIAL - Abnormal; Notable for the following:    Neutro Abs 11.7 (*)    Monocytes Absolute 1.3 (*)    All other components within normal limits  GLUCOSE, CAPILLARY - Abnormal; Notable for the following:    Glucose-Capillary 192 (*)    All other components within normal limits  URINE RAPID DRUG SCREEN (HOSP PERFORMED)  ETHANOL  POCT CBG MONITORING   Ct Head Wo Contrast  02/15/2011  *RADIOLOGY REPORT*  Clinical Data: Seizures.  CT HEAD WITHOUT CONTRAST  Technique:  Contiguous axial images were obtained from the base of the skull through the vertex without contrast.  Comparison: Head CT scan 01/27/2011.  Findings: The brain appears normal without evidence of acute infarction, hemorrhage, mass lesion, mass effect, midline shift or abnormal extra-axial fluid collection.  No hydrocephalus or pneumocephalus.  Mucous retention cyst or polyp right maxillary sinus noted.  IMPRESSION: No acute finding.  Original Report Authenticated By: Bernadene Bell. Maricela Curet, M.D.   Dg Toe Great Right  02/15/2011  *RADIOLOGY REPORT*  Clinical Data: Trauma, pain.  RIGHT TOE - 2+ VIEW  Comparison: None.  Findings: Bony fragment is seen off the base of the proximal phalanx of the great toe.  The appearances most suggestive of old trauma.  No definite acute fracture is identified.  There is no dislocation.  The patient has advanced first MTP degenerative disease.  IMPRESSION: Findings most suggestive of old trauma at the base of the proximal phalanx of the great toe.  No definite acute fracture.  Advanced first MTP degenerative disease noted.  Original Report Authenticated By: Bernadene Bell. D'ALESSIO, M.D.     1. Altered mental status   2. Renal insufficiency   3. Seizure disorder       MDM  Altered mental status.  Patient to be admitted by Dr. Sherrie Mustache  with [facility].          Candis Musa, PA 02/15/11 1727

## 2011-02-16 ENCOUNTER — Inpatient Hospital Stay (HOSPITAL_COMMUNITY): Payer: Medicare Other

## 2011-02-16 ENCOUNTER — Ambulatory Visit (HOSPITAL_COMMUNITY)
Admit: 2011-02-16 | Discharge: 2011-02-16 | Disposition: A | Discharge status: Home / Self Care | Payer: Medicare Other | Attending: Internal Medicine | Admitting: Internal Medicine

## 2011-02-16 DIAGNOSIS — D649 Anemia, unspecified: Secondary | ICD-10-CM | POA: Diagnosis present

## 2011-02-16 DIAGNOSIS — R892 Abnormal level of other drugs, medicaments and biological substances in specimens from other organs, systems and tissues: Secondary | ICD-10-CM | POA: Diagnosis present

## 2011-02-16 LAB — GLUCOSE, CAPILLARY: Glucose-Capillary: 179 mg/dL — ABNORMAL HIGH (ref 70–99)

## 2011-02-16 LAB — CBC
MCV: 87.7 fL (ref 78.0–100.0)
Platelets: 229 10*3/uL (ref 150–400)
RBC: 4.16 MIL/uL — ABNORMAL LOW (ref 4.22–5.81)
RDW: 14.4 % (ref 11.5–15.5)
WBC: 8.3 10*3/uL (ref 4.0–10.5)

## 2011-02-16 LAB — COMPREHENSIVE METABOLIC PANEL
Albumin: 3.3 g/dL — ABNORMAL LOW (ref 3.5–5.2)
Alkaline Phosphatase: 75 U/L (ref 39–117)
BUN: 41 mg/dL — ABNORMAL HIGH (ref 6–23)
Chloride: 100 mEq/L (ref 96–112)
Creatinine, Ser: 2.26 mg/dL — ABNORMAL HIGH (ref 0.50–1.35)
GFR calc Af Amer: 36 mL/min — ABNORMAL LOW (ref >90)
Glucose, Bld: 156 mg/dL — ABNORMAL HIGH (ref 70–99)
Total Bilirubin: 0.2 mg/dL — ABNORMAL LOW (ref 0.3–1.2)
Total Protein: 6.7 g/dL (ref 6.0–8.3)

## 2011-02-16 LAB — CARDIAC PANEL(CRET KIN+CKTOT+MB+TROPI)
CK, MB: 2.2 ng/mL (ref 0.3–4.0)
Troponin I: <0.3 ng/mL (ref <0.30)

## 2011-02-16 MED ORDER — DEXTROSE 5 % IV SOLN
1.0000 g | INTRAVENOUS | Status: DC
  Administered 2011-02-16 – 2011-02-19 (×4): 1 g via INTRAVENOUS
  Filled 2011-02-16 (×5): qty 1

## 2011-02-16 MED ORDER — FUROSEMIDE 40 MG PO TABS
40.0000 mg | ORAL_TABLET | Freq: Two times a day (BID) | ORAL | Status: DC
  Administered 2011-02-17 – 2011-02-19 (×6): 40 mg via ORAL
  Filled 2011-02-16 (×6): qty 1

## 2011-02-16 MED ORDER — LORAZEPAM 2 MG/ML IJ SOLN
0.5000 mg | Freq: Four times a day (QID) | INTRAMUSCULAR | Status: DC | PRN
  Administered 2011-02-17: 0.5 mg via INTRAVENOUS
  Filled 2011-02-16: qty 1

## 2011-02-16 MED ORDER — OXYCODONE HCL 5 MG PO TABS
10.0000 mg | ORAL_TABLET | Freq: Four times a day (QID) | ORAL | Status: DC | PRN
  Administered 2011-02-16 – 2011-02-17 (×4): 10 mg via ORAL
  Filled 2011-02-16 (×4): qty 2

## 2011-02-16 MED ORDER — METOPROLOL SUCCINATE ER 25 MG PO TB24
25.0000 mg | ORAL_TABLET | Freq: Every day | ORAL | Status: DC
  Administered 2011-02-16 – 2011-02-19 (×4): 25 mg via ORAL
  Filled 2011-02-16 (×4): qty 1

## 2011-02-16 MED ORDER — POTASSIUM CHLORIDE CRYS ER 20 MEQ PO TBCR
20.0000 meq | EXTENDED_RELEASE_TABLET | Freq: Two times a day (BID) | ORAL | Status: AC
  Administered 2011-02-16 – 2011-02-17 (×4): 20 meq via ORAL
  Filled 2011-02-16 (×4): qty 1

## 2011-02-16 MED ORDER — CLONAZEPAM 0.5 MG PO TABS
0.2500 mg | ORAL_TABLET | Freq: Two times a day (BID) | ORAL | Status: DC
  Administered 2011-02-16 – 2011-02-17 (×4): 0.25 mg via ORAL
  Filled 2011-02-16 (×4): qty 1

## 2011-02-16 MED ORDER — FAMOTIDINE 20 MG PO TABS
20.0000 mg | ORAL_TABLET | Freq: Every day | ORAL | Status: DC
  Administered 2011-02-17: 20 mg via ORAL
  Filled 2011-02-16: qty 1

## 2011-02-16 MED ORDER — GABAPENTIN 100 MG PO CAPS
200.0000 mg | ORAL_CAPSULE | Freq: Two times a day (BID) | ORAL | Status: DC
  Administered 2011-02-16 – 2011-02-18 (×5): 200 mg via ORAL
  Administered 2011-02-18: 100 mg via ORAL
  Administered 2011-02-19: 200 mg via ORAL
  Filled 2011-02-16: qty 1
  Filled 2011-02-16 (×6): qty 2

## 2011-02-16 MED ORDER — SPIRONOLACTONE 25 MG PO TABS
25.0000 mg | ORAL_TABLET | Freq: Every day | ORAL | Status: DC
  Administered 2011-02-16 – 2011-02-19 (×4): 25 mg via ORAL
  Filled 2011-02-16 (×4): qty 1

## 2011-02-16 NOTE — Consult Note (Signed)
Reason for Consult:AMS Referring Physician:FISHER  [name] is an 55 y.o. male.  HPI:  The patient presents with a 48-hour history or more of increasing lethargy and confusion. The patient has been evaluated and has had a rather extensive workup which has been mostly unrevealing. He has been noted however to have a increased creatinine up from March of this year from 2 to 3 which could be significant. The patient has been admitted to the hospital and has improved with folate 6 since be admitted. He indicates me that he has been having a lot of myoclonic jerks which she currently is being evaluated for a neurologist in [place]. He also has had a couple of neuropathy involving the hands which is currently being evaluated by the neurologist in [place]. He was noted to have frequent in the near continuous status jerking of the extremities which prompted this neurological consultation. That's concerned that the patient may be having seizures. The patient reports to me that he had his clonazepam increased 2 weeks ago 3 tablets a day to help control these myoclonic jerky type movements. He is noted to be on multiple psychotropic medications but reports that his wife has control of his medications. He denies overtaking his medications particularly the oxycodone 30 mg. Again, he reports that his wife helps with the administration of his medications. The patient denies focal neurological deficits such as weakness, numbness dysarthria dysphasia. He fell apparently a few days ago at sustaining some bruising on the right maxillary region. This has resulted in some headaches in that area.   Past Medical History  Diagnosis Date  . Diabetes mellitus   . Hypertension   . CHF (congestive heart failure)   . CRD (chronic renal disease)   . Arthritis   . Obesity   . Depression   . Sleep apnea   . Diabetic neuropathy   . CAD (coronary artery disease)   . Mitral regurgitation   . Esophagitis, reflux  07/07/10    MW tear, erosive reflux esophagitis, 2cm hh  . S/P colonoscopy April 2012    [place]: torturous colon, hyperplastic polyps  . Collagen vascular disease     "myoclonic "  . Tremor     Past Surgical History  Procedure Date  . Neck surgery   . Hand surgery     bilateral  . Colonoscopy before 2002    [place]  . Appendectomy     Family History  Problem Relation Age of Onset  . Stroke Father 72  . Stroke Mother 57  . Colon cancer Neg Hx   . Crohn's disease Neg Hx   . Cirrhosis Neg Hx   . Ulcerative colitis Neg Hx   . Stomach cancer Neg Hx     Social History:  reports that he has been smoking Cigarettes.  He has a 10 pack-year smoking history. He has never used smokeless tobacco. He reports that he does not drink alcohol or use illicit drugs.  Allergies: No Known Allergies  Medications:  Prior to Admission medications   Medication Sig Start Date End Date Taking? Authorizing Provider  amLODipine (NORVASC) 2.5 MG tablet Take 2.5 mg by mouth 3 (three) times daily.    Yes Historical Provider, MD  baclofen (LIORESAL) 10 MG tablet Take 10 mg by mouth 4 (four) times daily.  07/08/10  Yes Historical Provider, MD  clonazePAM (KLONOPIN) 0.5 MG tablet Take 0.5 mg by mouth 2 (two) times daily.    Yes Historical Provider, MD  docusate sodium (  COLACE) 100 MG capsule Take 100-200 mg by mouth 2 (two) times daily. Take 2 tablets every morning and 1 tablet at bedtime   Yes Historical Provider, MD  FLUoxetine (PROZAC) 40 MG capsule Take 40 mg by mouth daily.  07/09/10  Yes Historical Provider, MD  furosemide (LASIX) 40 MG tablet Take 40-80 mg by mouth 2 (two) times daily. Patient takes 80mg  every morning and then 40mg  in the evening if needed for fluid 07/08/10  Yes Historical Provider, MD  gabapentin (NEURONTIN) 400 MG capsule Take 400 mg by mouth 3 (three) times daily. Takes 1 tablet twice daily then an additional tablet in the evening if needed for diabetic neuropathy 07/08/10  Yes Historical  Provider, MD  hydrOXYzine (ATARAX/VISTARIL) 10 MG tablet Take 10 mg by mouth 2 (two) times daily.    Yes Historical Provider, MD  ibuprofen (ADVIL,MOTRIN) 200 MG tablet Take 400-600 mg by mouth as needed. For pain   Yes Historical Provider, MD  insulin NPH-insulin regular (NOVOLIN 70/30) (70-30) 100 UNIT/ML injection Inject 50-100 Units into the skin 2 (two) times daily. 55 units every morning and 100 units at bedtime   Yes Historical Provider, MD  losartan (COZAAR) 100 MG tablet Take 50 mg by mouth at bedtime.  07/08/10  Yes Historical Provider, MD  metoprolol succinate (TOPROL-XL) 25 MG 24 hr tablet Take 12.5 tablets by mouth at bedtime.  06/11/10  Yes Historical Provider, MD  Multiple Vitamins-Minerals (CENTRUM SILVER ULTRA MENS) TABS Take 1 tablet by mouth daily.     Yes Historical Provider, MD  nitroGLYCERIN (NITROSTAT) 0.4 MG SL tablet Place 0.4 mg under the tongue every 5 (five) minutes x 3 doses as needed. For chest pain   Yes Historical Provider, MD  oxycodone (ROXICODONE) 30 MG immediate release tablet Take 30 mg by mouth every 6 (six) hours.  07/20/10  Yes Historical Provider, MD  rOPINIRole (REQUIP) 4 MG tablet Take 4 mg by mouth at bedtime.     Yes Historical Provider, MD  simvastatin (ZOCOR) 40 MG tablet Take 20 mg by mouth at bedtime.  07/08/10  Yes Historical Provider, MD  spironolactone (ALDACTONE) 25 MG tablet Take 25 mg by mouth at bedtime.  07/08/10  Yes Historical Provider, MD  traZODone (DESYREL) 100 MG tablet Take 100 mg by mouth At bedtime.  07/08/10  Yes Historical Provider, MD  nicotine (NICODERM CQ - DOSED IN MG/24 HOURS) 21 mg/24hr patch Place 1 patch onto the skin daily.      Historical Provider, MD  ondansetron (ZOFRAN) 4 MG tablet Take 4 mg by mouth every 8 (eight) hours as needed. For nausea  12/09/10   Gerrit Halls, NP  pantoprazole (PROTONIX) 40 MG tablet Take 1 tablet by mouth every morning.  07/10/10   Historical Provider, MD  rOPINIRole (REQUIP) 2 MG tablet Take 4 mg by  mouth at bedtime.      Historical Provider, MD  triamcinolone (KENALOG) 0.5 % cream Apply 1 application topically.  11/27/10   Historical Provider, MD     Scheduled Meds:   . amLODipine  2.5 mg Oral TID  . cefTRIAXone (ROCEPHIN) IV  1 g Intravenous Q24H  . clonazePAM  0.25 mg Oral BID  . famotidine  20 mg Oral Daily  . furosemide  40 mg Oral BID  . gabapentin  200 mg Oral BID  . insulin aspart      . insulin aspart  0-9 Units Subcutaneous Q4H  . insulin glargine      . insulin glargine  10 Units Subcutaneous QHS  . metoprolol succinate  25 mg Oral Daily  . potassium chloride  20 mEq Oral BID  . sodium chloride      . spironolactone  25 mg Oral Daily  . DISCONTD: famotidine (PEPCID) IV  20 mg Intravenous Q24H  . DISCONTD: furosemide  20 mg Intravenous Daily  . DISCONTD: metoprolol  5 mg Intravenous Q6H   Continuous Infusions:   . dextrose 5 % and 0.9 % NaCl with KCl 20 mEq/L 60 mL/hr at 02/15/11 2100   PRN Meds:.acetaminophen, acetaminophen, albuterol, LORazepam, morphine, nitroGLYCERIN, ondansetron (ZOFRAN) IV, ondansetron, oxyCODONE, DISCONTD: LORazepam    Results for orders placed during the hospital encounter of 02/15/11 (from the past 48 hour(s))  CBC     Status: Abnormal   Collection Time   02/15/11 10:30 AM      Component Value Range Comment   WBC 16.0 (*) 4.0 - 10.5 (K/uL)    RBC 4.62  4.22 - 5.81 (MIL/uL)    Hemoglobin 13.3  13.0 - 17.0 (g/dL)    HCT 16.1  09.6 - 04.5 (%)    MCV 88.1  78.0 - 100.0 (fL)    MCH 28.8  26.0 - 34.0 (pg)    MCHC 32.7  30.0 - 36.0 (g/dL)    RDW 40.9  81.1 - 91.4 (%)    Platelets 240  150 - 400 (K/uL)   DIFFERENTIAL     Status: Abnormal   Collection Time   02/15/11 10:30 AM      Component Value Range Comment   Neutrophils Relative 73  43 - 77 (%)    Neutro Abs 11.7 (*) 1.7 - 7.7 (K/uL)    Lymphocytes Relative 17  12 - 46 (%)    Lymphs Abs 2.7  0.7 - 4.0 (K/uL)    Monocytes Relative 8  3 - 12 (%)    Monocytes Absolute 1.3 (*) 0.1  - 1.0 (K/uL)    Eosinophils Relative 2  0 - 5 (%)    Eosinophils Absolute 0.3  0.0 - 0.7 (K/uL)    Basophils Relative 1  0 - 1 (%)    Basophils Absolute 0.1  0.0 - 0.1 (K/uL)   GLUCOSE, CAPILLARY     Status: Abnormal   Collection Time   02/15/11 11:17 AM      Component Value Range Comment   Glucose-Capillary 192 (*) 70 - 99 (mg/dL)   BASIC METABOLIC PANEL     Status: Abnormal   Collection Time   02/15/11 11:35 AM      Component Value Range Comment   Sodium 139  135 - 145 (mEq/L)    Potassium 3.6  3.5 - 5.1 (mEq/L)    Chloride 96  96 - 112 (mEq/L)    CO2 31  19 - 32 (mEq/L)    Glucose, Bld 186 (*) 70 - 99 (mg/dL)    BUN 54 (*) 6 - 23 (mg/dL)    Creatinine, Ser 7.82 (*) 0.50 - 1.35 (mg/dL)    Calcium 9.8  8.4 - 10.5 (mg/dL)    GFR calc non Af Amer 20 (*) >90 (mL/min)    GFR calc Af Amer 23 (*) >90 (mL/min)   ETHANOL     Status: Normal   Collection Time   02/15/11 11:35 AM      Component Value Range Comment   Alcohol, Ethyl (B) <11  0 - 11 (mg/dL)   TSH     Status: Normal   Collection Time   02/15/11  11:35 AM      Component Value Range Comment   TSH 0.535  0.350 - 4.500 (uIU/mL)  Please note change in reference range for ages 72W to 50Y.   T4, FREE     Status: Normal   Collection Time   02/15/11 11:35 AM      Component Value Range Comment   Free T4 1.22  0.80 - 1.80 (ng/dL)   VITAMIN B14     Status: Normal   Collection Time   02/15/11 11:35 AM      Component Value Range Comment   Vitamin B-12 627  211 - 911 (pg/mL)   RPR     Status: Normal   Collection Time   02/15/11 11:35 AM      Component Value Range Comment   RPR NON REACTIVE  NON REACTIVE    URINE RAPID DRUG SCREEN (HOSP PERFORMED)     Status: Normal   Collection Time   02/15/11  2:11 PM      Component Value Range Comment   Opiates NONE DETECTED  NONE DETECTED     Cocaine NONE DETECTED  NONE DETECTED     Benzodiazepines NONE DETECTED  NONE DETECTED     Amphetamines NONE DETECTED  NONE DETECTED      Tetrahydrocannabinol NONE DETECTED  NONE DETECTED     Barbiturates NONE DETECTED  NONE DETECTED    GLUCOSE, CAPILLARY     Status: Abnormal   Collection Time   02/15/11  5:39 PM      Component Value Range Comment   Glucose-Capillary 167 (*) 70 - 99 (mg/dL)   CARDIAC PANEL(CRET KIN+CKTOT+MB+TROPI)     Status: Abnormal   Collection Time   02/15/11  5:40 PM      Component Value Range Comment   Total CK 273 (*) 7 - 232 (U/L)    CK, MB 4.6 (*) 0.3 - 4.0 (ng/mL)    Troponin I <0.30  <0.30 (ng/mL)    Relative Index 1.7  0.0 - 2.5    BLOOD GAS, ARTERIAL     Status: Abnormal   Collection Time   02/15/11  5:53 PM      Component Value Range Comment   O2 Content 2.0      Delivery systems NASAL CANNULA      pH, Arterial 7.386  7.350 - 7.450     pCO2 arterial 50.1 (*) 35.0 - 45.0 (mmHg)    pO2, Arterial 75.4 (*) 80.0 - 100.0 (mmHg)    Bicarbonate 29.4 (*) 20.0 - 24.0 (mEq/L)    TCO2 26.3  0 - 100 (mmol/L)    Acid-Base Excess 4.6 (*) 0.0 - 2.0 (mmol/L)    O2 Saturation 95.3      Patient temperature 37.0      Collection site RIGHT BRACHIAL      Drawn by COLLECTED BY RT      Sample type ARTERIAL      Allens test (pass/fail) NOT INDICATED (*) PASS    URINALYSIS, ROUTINE W REFLEX MICROSCOPIC     Status: Abnormal   Collection Time   02/15/11  5:55 PM      Component Value Range Comment   Color, Urine YELLOW  YELLOW     Appearance CLEAR  CLEAR     Specific Gravity, Urine 1.015  1.005 - 1.030     pH 6.0  5.0 - 8.0     Glucose, UA NEGATIVE  NEGATIVE (mg/dL)    Hgb urine dipstick SMALL (*) NEGATIVE  Bilirubin Urine NEGATIVE  NEGATIVE     Ketones, ur NEGATIVE  NEGATIVE (mg/dL)    Protein, ur NEGATIVE  NEGATIVE (mg/dL)    Urobilinogen, UA 0.2  0.0 - 1.0 (mg/dL)    Nitrite NEGATIVE  NEGATIVE     Leukocytes, UA NEGATIVE  NEGATIVE    URINE MICROSCOPIC-ADD ON     Status: Normal   Collection Time   02/15/11  5:55 PM      Component Value Range Comment   WBC, UA 3-6  <3 (WBC/hpf)    RBC / HPF  3-6  <3 (RBC/hpf)   AMMONIA     Status: Normal   Collection Time   02/15/11  6:00 PM      Component Value Range Comment   Ammonia 22  11 - 60 (umol/L)   GLUCOSE, CAPILLARY     Status: Abnormal   Collection Time   02/15/11  8:38 PM      Component Value Range Comment   Glucose-Capillary 179 (*) 70 - 99 (mg/dL)   GLUCOSE, CAPILLARY     Status: Abnormal   Collection Time   02/16/11  1:08 AM      Component Value Range Comment   Glucose-Capillary 182 (*) 70 - 99 (mg/dL)    Comment 1 Notify RN     GLUCOSE, CAPILLARY     Status: Abnormal   Collection Time   02/16/11  4:00 AM      Component Value Range Comment   Glucose-Capillary 185 (*) 70 - 99 (mg/dL)    Comment 1 Notify RN     COMPREHENSIVE METABOLIC PANEL     Status: Abnormal   Collection Time   02/16/11  4:07 AM      Component Value Range Comment   Sodium 141  135 - 145 (mEq/L)    Potassium 3.4 (*) 3.5 - 5.1 (mEq/L)    Chloride 100  96 - 112 (mEq/L)    CO2 31  19 - 32 (mEq/L)    Glucose, Bld 156 (*) 70 - 99 (mg/dL)    BUN 41 (*) 6 - 23 (mg/dL)    Creatinine, Ser 9.81 (*) 0.50 - 1.35 (mg/dL)    Calcium 9.4  8.4 - 10.5 (mg/dL)    Total Protein 6.7  6.0 - 8.3 (g/dL)    Albumin 3.3 (*) 3.5 - 5.2 (g/dL)    AST 15  0 - 37 (U/L)    ALT 11  0 - 53 (U/L)    Alkaline Phosphatase 75  39 - 117 (U/L)    Total Bilirubin 0.2 (*) 0.3 - 1.2 (mg/dL)    GFR calc non Af Amer 31 (*) >90 (mL/min)    GFR calc Af Amer 36 (*) >90 (mL/min)   CBC     Status: Abnormal   Collection Time   02/16/11  4:07 AM      Component Value Range Comment   WBC 8.3  4.0 - 10.5 (K/uL)    RBC 4.16 (*) 4.22 - 5.81 (MIL/uL)    Hemoglobin 11.9 (*) 13.0 - 17.0 (g/dL)    HCT 19.1 (*) 47.8 - 52.0 (%)    MCV 87.7  78.0 - 100.0 (fL)    MCH 28.6  26.0 - 34.0 (pg)    MCHC 32.6  30.0 - 36.0 (g/dL)    RDW 29.5  62.1 - 30.8 (%)    Platelets 229  150 - 400 (K/uL)   CARDIAC PANEL(CRET KIN+CKTOT+MB+TROPI)     Status: Normal   Collection Time  02/16/11  4:07 AM       Component Value Range Comment   Total CK 186  7 - 232 (U/L)    CK, MB 2.2  0.3 - 4.0 (ng/mL)    Troponin I <0.30  <0.30 (ng/mL)    Relative Index 1.2  0.0 - 2.5    GLUCOSE, CAPILLARY     Status: Abnormal   Collection Time   02/16/11  7:12 AM      Component Value Range Comment   Glucose-Capillary 174 (*) 70 - 99 (mg/dL)    Comment 1 Documented in Chart      Comment 2 Notify RN     GLUCOSE, CAPILLARY     Status: Abnormal   Collection Time   02/16/11 12:16 PM      Component Value Range Comment   Glucose-Capillary 186 (*) 70 - 99 (mg/dL)    Comment 1 Documented in Chart      Comment 2 Notify RN     GLUCOSE, CAPILLARY     Status: Abnormal   Collection Time   02/16/11  4:29 PM      Component Value Range Comment   Glucose-Capillary 232 (*) 70 - 99 (mg/dL)    Comment 1 Documented in Chart      Comment 2 Notify RN       Ct Head Wo Contrast  02/15/2011  *RADIOLOGY REPORT*  Clinical Data: Seizures.  CT HEAD WITHOUT CONTRAST  Technique:  Contiguous axial images were obtained from the base of the skull through the vertex without contrast.  Comparison: Head CT scan 01/27/2011.  Findings: The brain appears normal without evidence of acute infarction, hemorrhage, mass lesion, mass effect, midline shift or abnormal extra-axial fluid collection.  No hydrocephalus or pneumocephalus.  Mucous retention cyst or polyp right maxillary sinus noted.  IMPRESSION: No acute finding.  Original Report Authenticated By: Bernadene Bell. Maricela Curet, M.D.   Mr Brain Wo Contrast  02/16/2011  *RADIOLOGY REPORT*  Clinical Data: Recent fall, with dizziness, and multiple episodes of syncope.  Diabetes mellitus.  Hypertension.  Morbid obesity.  MRI HEAD WITHOUT CONTRAST  Technique:  Multiplanar, multiecho pulse sequences of the brain and surrounding structures were obtained according to standard protocol without intravenous contrast.  Comparison: CT head 02/15/2011.  CT face 02/14/2011.  MR head 11/23/2008.  Findings: No acute  stroke, intracranial hemorrhage, mass lesion, hydrocephalus, or extra-axial fluid. Mild atrophy is present. Minimal white matter disease is noted.  No foci of chronic hemorrhage.  Carotid and basilar arteries are widely patent. Coronal imaging demonstrates a fracture of the orbital floor on the right.  There appears to be extension of orbital fat through this defect consistent with an inferior blowout fracture.   There is no significant proptosis.  Normal pituitary and cerebellar tonsils.  No acute sinus or mastoid disease.  Little change from prior CT or MRI.  IMPRESSION: An inferior blowout fracture of the right orbit is redemonstrated. There appears to be herniation of orbital fat downward in the right maxillary sinus.  Mild atrophy with minimal white matter disease.  No evidence for acute stroke.  Original Report Authenticated By: Elsie Stain, M.D.   Dg Chest Portable 1 View  02/15/2011  *RADIOLOGY REPORT*  Clinical Data: Elevated white blood cell count.  PORTABLE CHEST - 1 VIEW  Comparison: Plain films of the chest 07/05/2010 and 05/30/2009.  Findings: Lungs appear clear.  Heart size upper normal.  No pneumothorax or effusion.  IMPRESSION: No acute finding.  Original Report Authenticated  By: Bernadene Bell Maricela Curet, M.D.   Dg Toe Great Right  02/15/2011  *RADIOLOGY REPORT*  Clinical Data: Trauma, pain.  RIGHT TOE - 2+ VIEW  Comparison: None.  Findings: Bony fragment is seen off the base of the proximal phalanx of the great toe.  The appearances most suggestive of old trauma.  No definite acute fracture is identified.  There is no dislocation.  The patient has advanced first MTP degenerative disease.  IMPRESSION: Findings most suggestive of old trauma at the base of the proximal phalanx of the great toe.  No definite acute fracture.  Advanced first MTP degenerative disease noted.  Original Report Authenticated By: Bernadene Bell. Maricela Curet, M.D.    Review of Systems  Constitutional: Negative.   Eyes:  Negative.   Respiratory: Negative.   Cardiovascular: Negative.   Gastrointestinal: Negative.   Genitourinary: Negative.   Skin: Negative.   Neurological: Positive for headaches.  Endo/Heme/Allergies: Negative.   Psychiatric/Behavioral: Negative.    Blood pressure 118/68, pulse 84, temperature 98.3 F (36.8 C), temperature source Oral, resp. rate 20, height 6' (1.829 m), weight 119.1 kg (262 lb 9.1 oz), SpO2 96.00%. Physical Exam GENERAL: Pleasant slightly overweight man in no acute distress.  HEENT: Supple. Normocephalic. Area of healing bruise right maxillary region.  ABDOMEN: soft  EXTREMITIES: No edema   BACK: Normal.  SKIN: Normal by inspection.    MENTAL STATUS: Alert and oriented. Speech, language and cognition are generally intact. Judgment and insight normal.   CRANIAL NERVES: Pupils are equal, round and reactive to light and accomodation; extra ocular movements are full, there is no significant nystagmus; visual fields are full; upper and lower facial muscles are normal in strength and symmetric, there is no flattening of the nasolabial folds; tongue is midline; uvula is midline; shoulder elevation is normal.  MOTOR: Normal tone, bulk and strength - except the hands bilaterally; no pronator drift. The hands are noted to have significant atrophy of the first ulcer interosseous muscles bilaterally. In fact, there is atrophy of all the dorsal interossei muscles bilaterally. No fasciculations are noted.  COORDINATION: Left finger to nose is normal, right finger to nose is normal, No rest tremor; no intention tremor; no postural tremor; no bradykinesia.  REFLEXES: Deep tendon reflexes are symmetrical and normal although the knees are diminished bilaterally. Babinski reflexes are flexor bilaterally.   SENSATION: Normal to light touch and pinprick.  Assessment/Plan: I suspect that this is most likely a toxic metabolic encephalopathy do to multiple etiologies. The most  prominent issue seems to be the increase in the clonazepam 2 weeks ago. Furthermore, his creatinine increased from 2 to 3 which likely reduce his clearance of medication. I suggest that his clonazepam to reduce back to 2 tablets a day.  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  Antavia Tandy 02/16/2011, 7:01 PM

## 2011-02-16 NOTE — Progress Notes (Signed)
Subjective: The patient is more alert this morning. He is asking for something to eat. He says that he has chronic pain in his back, his feet and his knees. He wants his oxycodone restarted.  Objective: Vital signs in last 24 hours: Filed Vitals:   02/15/11 1655 02/15/11 2008 02/16/11 0050 02/16/11 0600  BP: 127/62 150/78 146/75 156/79  Pulse: 79 91 66 80  Temp:  98.8 F (37.1 C) 100.4 F (38 C) 97.8 F (36.6 C)  TempSrc:  Oral Oral Oral  Resp: 15 18 18 22   Height:  6' (1.829 m)    Weight:  119.1 kg (262 lb 9.1 oz)    SpO2: 98% 97% 99% 90%    Intake/Output Summary (Last 24 hours) at 02/16/11 1330 Last data filed at 02/16/11 1200  Gross per 24 hour  Intake      0 ml  Output      1 ml  Net     -1 ml    Weight change:   Exam: General: 55 year old Caucasian man who appears to be older than his stated age. He is clearly more alert. Lungs: Decreased breath sounds in the bases but clear anteriorly. Heart: S1, S2, with a 2/6 systolic murmur. Abdomen: Morbidly obese, positive bowel sounds, nontender, nondistended. Extremities: No pedal edema. Neuro: No tremor. Alert and oriented x3. Cranial nerves II through XII are grossly intact.    Lab Results: Basic Metabolic Panel:  Basename 02/16/11 0407 02/15/11 1135  NA 141 139  K 3.4* 3.6  CL 100 96  CO2 31 31  GLUCOSE 156* 186*  BUN 41* 54*  CREATININE 2.26* 3.20*  CALCIUM 9.4 9.8  MG -- --  PHOS -- --   Liver Function Tests:  [facility] 02/16/11 0407  AST 15  ALT 11  ALKPHOS 75  BILITOT 0.2*  PROT 6.7  ALBUMIN 3.3*   No results found for this basename: LIPASE:2,AMYLASE:2 in the last 72 hours  Basename 02/15/11 1800  AMMONIA 22   CBC:  Basename 02/16/11 0407 02/15/11 1030  WBC 8.3 16.0*  NEUTROABS -- 11.7*  HGB 11.9* 13.3  HCT 36.5* 40.7  MCV 87.7 88.1  PLT 229 240   Cardiac Enzymes:  Basename 02/16/11 0407 02/15/11 1740  CKTOTAL 186 273*  CKMB 2.2 4.6*  CKMBINDEX -- --  TROPONINI <0.30 <0.30    BNP: No results found for this basename: POCBNP:3 in the last 72 hours D-Dimer: No results found for this basename: DDIMER:2 in the last 72 hours CBG:  Basename 02/16/11 1216 02/16/11 0712 02/16/11 0400 02/16/11 0108 02/15/11 2038 02/15/11 1739  GLUCAP 186* 174* 185* 182* 179* 167*   Hemoglobin A1C: No results found for this basename: HGBA1C in the last 72 hours Fasting Lipid Panel: No results found for this basename: CHOL,HDL,LDLCALC,TRIG,CHOLHDL,LDLDIRECT in the last 72 hours Thyroid Function Tests:  Basename 02/15/11 1135  TSH 0.535  T4TOTAL --  FREET4 1.22  T3FREE --  THYROIDAB --   Anemia Panel:  Basename 02/15/11 1135  VITAMINB12 627  FOLATE --  FERRITIN --  TIBC --  IRON --  RETICCTPCT --   Urine Drug Screen: Negative  Alcohol Level:  Basename 02/15/11 1135  ETH <11   Urinalysis: 3-6 WBCs 3-6 RBCs  Misc. Labs:   Micro: No results found for this or any previous visit (from the past 240 hour(s)).  Studies/Results: Ct Head Wo Contrast  02/15/2011  *RADIOLOGY REPORT*  Clinical Data: Seizures.  CT HEAD WITHOUT CONTRAST  Technique:  Contiguous axial images were  obtained from the base of the skull through the vertex without contrast.  Comparison: Head CT scan 01/27/2011.  Findings: The brain appears normal without evidence of acute infarction, hemorrhage, mass lesion, mass effect, midline shift or abnormal extra-axial fluid collection.  No hydrocephalus or pneumocephalus.  Mucous retention cyst or polyp right maxillary sinus noted.  IMPRESSION: No acute finding.  Original Report Authenticated By: Bernadene Bell. Maricela Curet, M.D.   Mr Brain Wo Contrast  02/16/2011  *RADIOLOGY REPORT*  Clinical Data: Recent fall, with dizziness, and multiple episodes of syncope.  Diabetes mellitus.  Hypertension.  Morbid obesity.  MRI HEAD WITHOUT CONTRAST  Technique:  Multiplanar, multiecho pulse sequences of the brain and surrounding structures were obtained according to standard  protocol without intravenous contrast.  Comparison: CT head 02/15/2011.  CT face 02/14/2011.  MR head 11/23/2008.  Findings: No acute stroke, intracranial hemorrhage, mass lesion, hydrocephalus, or extra-axial fluid. Mild atrophy is present. Minimal white matter disease is noted.  No foci of chronic hemorrhage.  Carotid and basilar arteries are widely patent. Coronal imaging demonstrates a fracture of the orbital floor on the right.  There appears to be extension of orbital fat through this defect consistent with an inferior blowout fracture.   There is no significant proptosis.  Normal pituitary and cerebellar tonsils.  No acute sinus or mastoid disease.  Little change from prior CT or MRI.  IMPRESSION: An inferior blowout fracture of the right orbit is redemonstrated. There appears to be herniation of orbital fat downward in the right maxillary sinus.  Mild atrophy with minimal white matter disease.  No evidence for acute stroke.  Original Report Authenticated By: Elsie Stain, M.D.   Dg Chest Portable 1 View  02/15/2011  *RADIOLOGY REPORT*  Clinical Data: Elevated white blood cell count.  PORTABLE CHEST - 1 VIEW  Comparison: Plain films of the chest 07/05/2010 and 05/30/2009.  Findings: Lungs appear clear.  Heart size upper normal.  No pneumothorax or effusion.  IMPRESSION: No acute finding.  Original Report Authenticated By: Bernadene Bell. Maricela Curet, M.D.   Dg Toe Great Right  02/15/2011  *RADIOLOGY REPORT*  Clinical Data: Trauma, pain.  RIGHT TOE - 2+ VIEW  Comparison: None.  Findings: Bony fragment is seen off the base of the proximal phalanx of the great toe.  The appearances most suggestive of old trauma.  No definite acute fracture is identified.  There is no dislocation.  The patient has advanced first MTP degenerative disease.  IMPRESSION: Findings most suggestive of old trauma at the base of the proximal phalanx of the great toe.  No definite acute fracture.  Advanced first MTP degenerative disease  noted.  Original Report Authenticated By: Bernadene Bell. Maricela Curet, M.D.    Medications: I have reviewed the patient's current medications.  Assessment: Active Problems:  Delirium  Tremor  ARF (acute renal failure)  CKD (chronic kidney disease), stage III  Leukocytosis  OSA on CPAP  Chronic systolic heart failure  HTN (hypertension)  DM type 2 with diabetic peripheral neuropathy  Polypharmacy  Anemia  UTI (lower urinary tract infection)  Drug toxicity  1. Delirium secondary to polypharmacy. Highly suspect drug toxicity from interactions of multiple psychotropic medications, including gabapentin, oxycodone, baclofen, Klonopin, hydroxyzine, trazodone, and Requip. His MRI reveals no acute strokes. His ABG revealed no significant hypoxemia or hypercapnia. His ammonia level was within normal limits. Tremor. Currently resolved. His so-called myoclonic tremors may be the consequence of polypharmacy. However, we'll await neurology's evaluation and recommendations. Urinary tract infection. We'll start  Rocephin. Acute renal failure superimposed on chronic kidney disease. His creatinine is close to baseline on gentle IV fluids and holding diuretics temporarily. Chronic systolic congestive heart failure. Currently compensated. His cardiac enzymes were within normal limits. Hypertension. His blood pressure is trending up. 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 Currently stable. Normocytic anemia. Obstructive sleep apnea, on CPAP chronically. Chronic pain syndrome, opioid dependent. Tobacco abuse. Patient advised to quit.  Plan:  1. We'll advance his diet to a full liquid diet. Will restart Neurontin at a lower dose. Will restart when necessary oxycodone at a much lower dose. Will restart metoprolol and spironolactone. We'll replete his potassium chloride. We'll restart Lasix tomorrow. Restart Klonopin at a lower dose. EEG ordered. Neurology consult ordered. Continue to hold  Cozaar, baclofen, Prozac, hydroxyzine, Requip, and trazodone.     LOS: 1 day   Yudith Norlander 02/16/2011, 1:30 PM

## 2011-02-17 LAB — BASIC METABOLIC PANEL
BUN: 30 mg/dL — ABNORMAL HIGH (ref 6–23)
CO2: 32 mEq/L (ref 19–32)
Calcium: 9.3 mg/dL (ref 8.4–10.5)
Creatinine, Ser: 1.75 mg/dL — ABNORMAL HIGH (ref 0.50–1.35)
Glucose, Bld: 148 mg/dL — ABNORMAL HIGH (ref 70–99)

## 2011-02-17 LAB — GLUCOSE, CAPILLARY
Glucose-Capillary: 128 mg/dL — ABNORMAL HIGH (ref 70–99)
Glucose-Capillary: 146 mg/dL — ABNORMAL HIGH (ref 70–99)
Glucose-Capillary: 171 mg/dL — ABNORMAL HIGH (ref 70–99)
Glucose-Capillary: 265 mg/dL — ABNORMAL HIGH (ref 70–99)

## 2011-02-17 LAB — URINE CULTURE

## 2011-02-17 LAB — CBC
MCH: 28.9 pg (ref 26.0–34.0)
MCHC: 33 g/dL (ref 30.0–36.0)
MCV: 87.5 fL (ref 78.0–100.0)
Platelets: 215 10*3/uL (ref 150–400)
RDW: 13.9 % (ref 11.5–15.5)

## 2011-02-17 LAB — HEMOGLOBIN A1C: Mean Plasma Glucose: 154 mg/dL — ABNORMAL HIGH (ref <117)

## 2011-02-17 MED ORDER — SIMVASTATIN 20 MG PO TABS: 40.0000 mg | ORAL_TABLET | Freq: Every day | ORAL | Status: DC

## 2011-02-17 MED ORDER — LOSARTAN POTASSIUM 50 MG PO TABS
50.0000 mg | ORAL_TABLET | Freq: Every day | ORAL | Status: DC
  Administered 2011-02-17 – 2011-02-18 (×2): 50 mg via ORAL
  Filled 2011-02-17 (×2): qty 1

## 2011-02-17 MED ORDER — PANTOPRAZOLE SODIUM 40 MG PO TBEC
40.0000 mg | DELAYED_RELEASE_TABLET | Freq: Every day | ORAL | Status: DC
  Administered 2011-02-17 – 2011-02-19 (×3): 40 mg via ORAL
  Filled 2011-02-17 (×2): qty 1

## 2011-02-17 MED ORDER — OXYCODONE HCL 5 MG PO TABS
20.0000 mg | ORAL_TABLET | Freq: Four times a day (QID) | ORAL | Status: DC | PRN
  Administered 2011-02-17 (×2): 20 mg via ORAL
  Administered 2011-02-18: 5 mg via ORAL
  Administered 2011-02-18 (×2): 20 mg via ORAL
  Administered 2011-02-18: 5 mg via ORAL
  Administered 2011-02-19 (×2): 20 mg via ORAL
  Filled 2011-02-17 (×7): qty 4

## 2011-02-17 MED ORDER — INSULIN ASPART PROT & ASPART (70-30 MIX) 100 UNIT/ML ~~LOC~~ SUSP
15.0000 [IU] | Freq: Two times a day (BID) | SUBCUTANEOUS | Status: DC
  Administered 2011-02-17 – 2011-02-19 (×4): 15 [IU] via SUBCUTANEOUS

## 2011-02-17 MED ORDER — ROSUVASTATIN CALCIUM 5 MG PO TABS
10.0000 mg | ORAL_TABLET | Freq: Every day | ORAL | Status: DC
  Administered 2011-02-17 – 2011-02-18 (×2): 10 mg via ORAL
  Filled 2011-02-17 (×2): qty 2

## 2011-02-17 NOTE — Progress Notes (Signed)
Subjective: Interval History: No new complaints are reported. The patient's EEG results shows infrequent left temporal epileptiform discharges.  Objective: Vital signs in last 24 hours: Temp:  [97.2 F (36.2 C)-98.3 F (36.8 C)] 98.3 F (36.8 C) (10/23 2053) Pulse Rate:  [55-64] 55  (10/23 2053) Resp:  [18-20] 20  (10/23 2053) BP: (128-151)/(73-82) 149/73 mmHg (10/23 2053) SpO2:  [96 %-99 %] 99 % (10/23 2053)  Intake/Output from previous day: 10/22 0701 - 10/23 0700 In: 0  Out: 2 [Urine:2] Intake/Output this shift:   Nutritional status: Carb Control  PE GENERAL: Pleasant slightly overweight man in no acute distress.  HEENT: Supple. Normocephalic. Area of healing bruise right maxillary region.  ABDOMEN: soft  EXTREMITIES: No edema  BACK: Normal.  SKIN: Normal by inspection.  MENTAL STATUS: Alert and oriented. Speech, language and cognition are generally intact. Judgment and insight normal.  MOTOR: Normal tone, bulk and strength - except the hands bilaterally; no pronator drift. The hands are noted to have significant atrophy of the first ulcer interosseous muscles bilaterally. In fact, there is atrophy of all the dorsal interossei muscles bilaterally. No fasciculations are noted.  COORDINATION: Left finger to nose is normal, right finger to nose is normal, No rest tremor; no intention tremor; no postural tremor; no bradykinesia.  Lab Results:  [facility] 02/17/11 0621 02/16/11 0407  WBC 9.8 8.3  HGB 11.8* 11.9*  HCT 35.8* 36.5*  PLT 215 229  NA 140 141  K 3.6 3.4*  CL 101 100  CO2 32 31  GLUCOSE 148* 156*  BUN 30* 41*  CREATININE 1.75* 2.26*  CALCIUM 9.3 9.4  LABA1C -- --   Lipid Panel No results found for this basename: CHOL,TRIG,HDL,CHOLHDL,VLDL,LDLCALC in the last 72 hours  Studies/Results: Mr Brain Wo Contrast  02/16/2011  *RADIOLOGY REPORT*  Clinical Data: Recent fall, with dizziness, and multiple episodes of syncope.  Diabetes mellitus.  Hypertension.   Morbid obesity.  MRI HEAD WITHOUT CONTRAST  Technique:  Multiplanar, multiecho pulse sequences of the brain and surrounding structures were obtained according to standard protocol without intravenous contrast.  Comparison: CT head 02/15/2011.  CT face 02/14/2011.  MR head 11/23/2008.  Findings: No acute stroke, intracranial hemorrhage, mass lesion, hydrocephalus, or extra-axial fluid. Mild atrophy is present. Minimal white matter disease is noted.  No foci of chronic hemorrhage.  Carotid and basilar arteries are widely patent. Coronal imaging demonstrates a fracture of the orbital floor on the right.  There appears to be extension of orbital fat through this defect consistent with an inferior blowout fracture.   There is no significant proptosis.  Normal pituitary and cerebellar tonsils.  No acute sinus or mastoid disease.  Little change from prior CT or MRI.  IMPRESSION: An inferior blowout fracture of the right orbit is redemonstrated. There appears to be herniation of orbital fat downward in the right maxillary sinus.  Mild atrophy with minimal white matter disease.  No evidence for acute stroke.  Original Report Authenticated By: Elsie Stain, M.D.    Medications:  Scheduled Meds:   . amLODipine  2.5 mg Oral TID  . cefTRIAXone (ROCEPHIN) IV  1 g Intravenous Q24H  . clonazePAM  0.25 mg Oral BID  . furosemide  40 mg Oral BID  . gabapentin  200 mg Oral BID  . insulin aspart  0-9 Units Subcutaneous Q4H  . insulin aspart protamine-insulin aspart  15 Units Subcutaneous BID WC  . levETIRAcetam  250 mg Oral BID  . losartan  50 mg Oral  QHS  . metoprolol succinate  25 mg Oral Daily  . pantoprazole  40 mg Oral Q1200  . potassium chloride  20 mEq Oral BID  . rosuvastatin  10 mg Oral q1800  . spironolactone  25 mg Oral Daily  . DISCONTD: famotidine  20 mg Oral Daily  . DISCONTD: insulin glargine  10 Units Subcutaneous QHS  . DISCONTD: simvastatin  40 mg Oral q1800   Continuous Infusions:   .  dextrose 5 % and 0.9 % NaCl with KCl 20 mEq/L 50 mL/hr at 02/18/11 0706   PRN Meds:.acetaminophen, acetaminophen, albuterol, LORazepam, morphine, nitroGLYCERIN, ondansetron (ZOFRAN) IV, ondansetron, oxyCODONE, DISCONTD: oxyCODONE   Assessment/Plan: -Resolved toxin metabolic encephalopathy. This is most likely multifactorial due to the increase of clonazepam, polypharmacy and reduced clarence with increased creatinine from 2-3 months ago to 3 during his hospitalization. The patient's clonazepam has already been reduced. We agree with this reduction. -Myoclonic jerking activity. This possibly could be epileptic given the EEG findings. The patient will be started on low-dose Keppra.   LOS: 2 days   Fraser Busche

## 2011-02-17 NOTE — Progress Notes (Addendum)
Subjective: The patient is asleep but easily arousable. He complains of chronic neck and low back pain. He wants to dose of oxycodone increased.  Objective: Vital signs in last 24 hours: Filed Vitals:   02/16/11 2043 02/16/11 2300 02/17/11 0534 02/17/11 1004  BP:  127/68 151/82   Pulse:  64 64   Temp:  98.3 F (36.8 C) 98.1 F (36.7 C)   TempSrc:      Resp:  16 20   Height:      Weight:      SpO2: 96% 97% 99% 96%    Intake/Output Summary (Last 24 hours) at 02/17/11 1618 Last data filed at 02/17/11 1400  Gross per 24 hour  Intake   2800 ml  Output      0 ml  Net   2800 ml    Weight change:   Exam: General: 55 year old Caucasian man who appears to be older than his stated age. He is clearly more alert. Lungs: Decreased breath sounds in the bases but clear anteriorly. Heart: S1, S2, with a 2/6 systolic murmur. Abdomen: Morbidly obese, positive bowel sounds, nontender, nondistended. Extremities: No pedal edema. Neuro: No tremor. Alert and oriented x3. Cranial nerves II through XII are grossly intact. Gait without ataxia.    Lab Results: Basic Metabolic Panel:  Basename 02/17/11 0621 02/16/11 0407  NA 140 141  K 3.6 3.4*  CL 101 100  CO2 32 31  GLUCOSE 148* 156*  BUN 30* 41*  CREATININE 1.75* 2.26*  CALCIUM 9.3 9.4  MG -- --  PHOS -- --   Liver Function Tests:  [facility] 02/16/11 0407  AST 15  ALT 11  ALKPHOS 75  BILITOT 0.2*  PROT 6.7  ALBUMIN 3.3*   No results found for this basename: LIPASE:2,AMYLASE:2 in the last 72 hours  Basename 02/15/11 1800  AMMONIA 22   CBC:  Basename 02/17/11 0621 02/16/11 0407 02/15/11 1030  WBC 9.8 8.3 --  NEUTROABS -- -- 11.7*  HGB 11.8* 11.9* --  HCT 35.8* 36.5* --  MCV 87.5 87.7 --  PLT 215 229 --   Cardiac Enzymes:  Basename 02/16/11 0407 02/15/11 1740  CKTOTAL 186 273*  CKMB 2.2 4.6*  CKMBINDEX -- --  TROPONINI <0.30 <0.30   BNP: No results found for this basename: POCBNP:3 in the last 72  hours D-Dimer: No results found for this basename: DDIMER:2 in the last 72 hours CBG:  Basename 02/17/11 1137 02/17/11 0731 02/17/11 0421 02/17/11 0009 02/16/11 2007 02/16/11 1629  GLUCAP 171* 148* 146* 128* 288* 232*   Hemoglobin A1C:  Basename 02/15/11 1135  HGBA1C 7.0*   Fasting Lipid Panel: No results found for this basename: CHOL,HDL,LDLCALC,TRIG,CHOLHDL,LDLDIRECT in the last 72 hours Thyroid Function Tests:  Basename 02/15/11 1135  TSH 0.535  T4TOTAL --  FREET4 1.22  T3FREE --  THYROIDAB --   Anemia Panel:  Basename 02/15/11 1135  VITAMINB12 627  FOLATE --  FERRITIN --  TIBC --  IRON --  RETICCTPCT --   Urine Drug Screen: Negative  Alcohol Level:  Basename 02/15/11 1135  ETH <11   Urinalysis: 3-6 WBCs 3-6 RBCs  Misc. Labs:   Micro: Recent Results (from the past 240 hour(s))  URINE CULTURE     Status: Normal   Collection Time   02/15/11  5:55 PM      Component Value Range Status Comment   Specimen Description URINE, CLEAN CATCH   Final    Special Requests NONE   Final    Setup  Time [id number]   Final    Colony Count 60,000 COLONIES/ML   Final    Culture     Final    Value: Multiple bacterial morphotypes present, none predominant. Suggest appropriate recollection if clinically indicated.   Report Status 02/17/2011 FINAL   Final     Studies/Results: Mr Brain Wo Contrast  02/16/2011  *RADIOLOGY REPORT*  Clinical Data: Recent fall, with dizziness, and multiple episodes of syncope.  Diabetes mellitus.  Hypertension.  Morbid obesity.  MRI HEAD WITHOUT CONTRAST  Technique:  Multiplanar, multiecho pulse sequences of the brain and surrounding structures were obtained according to standard protocol without intravenous contrast.  Comparison: CT head 02/15/2011.  CT face 02/14/2011.  MR head 11/23/2008.  Findings: No acute stroke, intracranial hemorrhage, mass lesion, hydrocephalus, or extra-axial fluid. Mild atrophy is present. Minimal white matter disease  is noted.  No foci of chronic hemorrhage.  Carotid and basilar arteries are widely patent. Coronal imaging demonstrates a fracture of the orbital floor on the right.  There appears to be extension of orbital fat through this defect consistent with an inferior blowout fracture.   There is no significant proptosis.  Normal pituitary and cerebellar tonsils.  No acute sinus or mastoid disease.  Little change from prior CT or MRI.  IMPRESSION: An inferior blowout fracture of the right orbit is redemonstrated. There appears to be herniation of orbital fat downward in the right maxillary sinus.  Mild atrophy with minimal white matter disease.  No evidence for acute stroke.  Original Report Authenticated By: Elsie Stain, M.D.   Dg Chest Portable 1 View  02/15/2011  *RADIOLOGY REPORT*  Clinical Data: Elevated white blood cell count.  PORTABLE CHEST - 1 VIEW  Comparison: Plain films of the chest 07/05/2010 and 05/30/2009.  Findings: Lungs appear clear.  Heart size upper normal.  No pneumothorax or effusion.  IMPRESSION: No acute finding.  Original Report Authenticated By: Bernadene Bell. Maricela Curet, M.D.    Medications: I have reviewed the patient's current medications.  Assessment: Active Problems:  Delirium  Tremor  ARF (acute renal failure)  CKD (chronic kidney disease), stage III  Leukocytosis  OSA on CPAP  Chronic systolic heart failure  HTN (hypertension)  DM type 2 with diabetic peripheral neuropathy  Polypharmacy  Anemia  UTI (lower urinary tract infection)  Drug toxicity  1. Delirium secondary to polypharmacy. Highly suspect drug toxicity from interactions of multiple psychotropic medications, including gabapentin, oxycodone, baclofen, Klonopin, hydroxyzine, trazodone, and Requip. This was explained to the patient and he did not seem surprised. His MRI reveals no acute strokes. His ABG revealed no significant hypoxemia or hypercapnia. His ammonia level is within normal limits. His  thyroid  function is within normal limits. His cardiac enzymes are virtually normal. His vitamin B 12 is within normal limits. His RPR is nonreactive. Tremor. Currently resolved. His so-called myoclonic tremors may be the consequence of polypharmacy. However, we'll await neurology's evaluation and recommendations. Klonopin has been restarted at half the dose. Baclofen and Requip will remain discontinued. Urinary tract infection. On Rocephin. Acute renal failure superimposed on chronic kidney disease. His creatinine is at baseline on gentle IV fluids. Chronic systolic congestive heart failure. Currently compensated. His cardiac enzymes were within normal limits. Metoprolol and spironolactone was restarted yesterday. Will restart Lasix. Hypertension. His blood pressure is trending up. 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 Currently stable. Normocytic anemia. Obstructive sleep apnea, on CPAP chronically. Chronic pain syndrome, opioid dependent. Tobacco abuse. Patient advised to quit. Chronic  pain syndrome. We'll titrate the oxycodone to 20 mg every 6 hours when necessary before restarting his usual home dose. Neurontin has been restarted at a lower dose. Subacute right orbital fracture. This occurred a couple of weeks ago from a fall. The patient was referred to an ophthalmologist as an outpatient.    Plan: Will restart Lasix and advance his diet. Restart 70/30 insulin at a lower dose and titrate accordingly. Now that his renal function has improved, will restart Cozaar. EEG ordered. Neurology consult ordered. The patient reminded me that he is on CPAP and it will therefore be ordered.     LOS: 2 days   Maykayla Highley 02/17/2011, 4:18 PM

## 2011-02-17 NOTE — Progress Notes (Signed)
Physical Therapy Evaluation Patient Details Name: JACQUE GARRELS MRN: [id number] DOB: [birth year] Today's Date: 02/17/2011  Problem List:  Patient Active Problem List  Diagnoses  . DIABETES MELLITUS, TYPE II, UNCONTROLLED, WITH COMPLICATIONS  . HYPERLIPIDEMIA  . MORBID OBESITY  . UNSPECIFIED ANEMIA  . ANXIETY  . ERECTILE DYSFUNCTION  . INSOMNIA, CHRONIC  . DEPRESSION  . SLEEP APNEA, OBSTRUCTIVE  . RESTLESS LEG SYNDROME  . CARPAL TUNNEL SYNDROME, BILATERAL  . PERIPHERAL NEUROPATHY  . HYPERTENSION  . MYOCARDIAL INFARCTION, HX OF  . CORONARY ARTERY DISEASE  . CARDIOMYOPATHY  . CONGESTIVE HEART FAILURE  . GERD  . RENAL INSUFFICIENCY, ACUTE  . ARTHRITIS  . ANKLE PAIN, BILATERAL  . NECK PAIN, CHRONIC  . LOW BACK PAIN  . DUPUYTREN'S CONTRACTURE, BILATERAL  . UNSPECIFIED MYALGIA AND MYOSITIS  . FOOT PAIN, BILATERAL  . MEMORY LOSS  . NUMBNESS, HAND  . Heme positive stool  . Colitis  . Folliculitis of perineum  . Nausea and vomiting  . Delirium  . Tremor  . ARF (acute renal failure)  . CKD (chronic kidney disease), stage III  . Leukocytosis  . OSA on CPAP  . Chronic systolic heart failure  . HTN (hypertension)  . DM type 2 with diabetic peripheral neuropathy  . Polypharmacy  . Anemia  . UTI (lower urinary tract infection)  . Drug toxicity    Past Medical History:  Past Medical History  Diagnosis Date  . Diabetes mellitus   . Hypertension   . CHF (congestive heart failure)   . CRD (chronic renal disease)   . Arthritis   . Obesity   . Depression   . Sleep apnea   . Diabetic neuropathy   . CAD (coronary artery disease)   . Mitral regurgitation   . Esophagitis, reflux 07/07/10    MW tear, erosive reflux esophagitis, 2cm hh  . S/P colonoscopy April 2012    Rourk: torturous colon, hyperplastic polyps  . Collagen vascular disease     "myoclonic "  . Tremor    Past Surgical History:  Past Surgical History  Procedure Date  . Neck surgery   . Hand surgery     bilateral  . Colonoscopy before 2002    [place]  . Appendectomy     PT Assessment/Plan/Recommendation PT Assessment Clinical Impression Statement: Pt with chronic LBP has undergone 6 wks of out-patient therapy and has HEP.   PT Recommendation/Assessment: Patent does not need any further PT services No Skilled PT: Patient at baseline level of functioning PT Goals     PT Evaluation Precautions/Restrictions  Precautions Required Braces or Orthoses: No Restrictions Weight Bearing Restrictions: Yes RLE Weight Bearing: Weight bearing as tolerated Prior Functioning  Home Living Lives With: Spouse Type of Home: House Home Layout: One level Home Access: Ramped entrance Home Adaptive Equipment: Straight cane;Walker - rolling;Quad cane Prior Function Level of Independence: Independent with basic ADLs Driving: Yes Vocation: Retired Financial risk analyst Arousal/Alertness: Awake/alert Overall Cognitive Status: Appears within functional limits for tasks assessed Orientation Level: Oriented X4 Sensation/Coordination   Extremity Assessment RLE Assessment RLE Assessment: Exceptions to [facility] RLE AROM (degrees) Overall AROM Right Lower Extremity: Within functional limits for tasks assessed RLE Strength RLE Overall Strength: Deficits;Due to premorbid status LLE Assessment LLE Assessment: Exceptions to WFL LLE AROM (degrees) Overall AROM Left Lower Extremity: Within functional limits for tasks assessed LLE Strength LLE Overall Strength: Deficits;Due to premorbid status Mobility (including Balance) Bed Mobility Bed Mobility: Yes Supine to Sit: 6: Modified independent (Device/Increase time)  Transfers Transfers: Yes Sit to Stand: 6: Modified independent (Device/Increase time) Stand to Sit: 6: Modified independent (Device/Increase time) Ambulation/Gait Ambulation/Gait: Yes Ambulation/Gait Assistance: 6: Modified independent (Device/Increase time) Ambulation Distance (Feet): 200 Feet  (walking increases toe pain recommend cane) Assistive device: None Gait Pattern: Decreased step length - left Gait velocity: slow Stairs: No Wheelchair Mobility Wheelchair Mobility: No  Balance Balance Assessed: No Exercise    End of Session PT - End of Session Equipment Utilized During Treatment: Gait belt Activity Tolerance: Patient tolerated treatment well Patient left: in bed Nurse Communication: Mobility status for transfers General Behavior During Session: [facility] for tasks performed Cognition: [facility] for tasks performed  Denece Shearer,CINDY 02/17/2011, 1:36 PM

## 2011-02-17 NOTE — Procedures (Signed)
NAME:  [name], DAMIANO                ACCOUNT NO.:  [id number]  MEDICAL RECORD NO.:  [id number]  LOCATION:  EE                           FACILITY:  [facility]  PHYSICIAN:  Cyerra Yim A. Gerilyn Pilgrim, M.D. DATE OF BIRTH:  Jan 21, 1956  DATE OF PROCEDURE:  02/16/2011 DATE OF DISCHARGE:  02/16/2011                             EEG INTERPRETATION   INDICATION FOR STUDY:  This is a 55 year old man who presents with frequent spells of myoclonic jerks and other movement disordered.  He presents with altered mental status and encephalopathy.  MEDICATION:  Neurontin, Norvasc, baclofen, clonazepam, Colace, Prozac, Lasix, Vistaril, Motrin, nitroglycerin, Zofran, oxycodone, Zocor, Protonix, Desyrel, Aldactone Cozaar, insulin, Toprol.  ANALYSIS:  A 16-channel recording using standard 10/20 measurements were conducted for 20 minutes.  There is a well-formed posterior dominant rhythm of 9-9.5 Hz which attenuates with eye opening.  There is beta activity observed in the frontal areas.  Awake and sleep activities are recorded.  Stage 2 sleep with K complexes and sleep spindles are observed.  Photic stimulation was carried out without abnormal changes in the background activity.  There is no focal or lateralized slowing. There are 2 sharpwave activities that phase reverse at T3. No electrographic seizures.  IMPRESSION:  Slightly abnormal recording showing a couple of epileptiform discharges involving the left temporal area.  No electrographic seizures, however.     Jourdan Maldonado A. Gerilyn Pilgrim, M.D.     KAD/MEDQ  D:  02/17/2011  T:  02/17/2011  Job:  [id number]

## 2011-02-18 LAB — BASIC METABOLIC PANEL
BUN: 23 mg/dL (ref 6–23)
Chloride: 100 mEq/L (ref 96–112)
Creatinine, Ser: 1.71 mg/dL — ABNORMAL HIGH (ref 0.50–1.35)
GFR calc Af Amer: 50 mL/min — ABNORMAL LOW (ref >90)
Glucose, Bld: 130 mg/dL — ABNORMAL HIGH (ref 70–99)
Potassium: 3.6 mEq/L (ref 3.5–5.1)

## 2011-02-18 LAB — GLUCOSE, CAPILLARY
Glucose-Capillary: 103 mg/dL — ABNORMAL HIGH (ref 70–99)
Glucose-Capillary: 174 mg/dL — ABNORMAL HIGH (ref 70–99)
Glucose-Capillary: 161 mg/dL — ABNORMAL HIGH (ref 70–99)
Glucose-Capillary: 167 mg/dL — ABNORMAL HIGH (ref 70–99)

## 2011-02-18 MED ORDER — CLONAZEPAM 0.5 MG PO TABS
0.2500 mg | ORAL_TABLET | Freq: Two times a day (BID) | ORAL | Status: DC
  Administered 2011-02-18 – 2011-02-19 (×3): 0.25 mg via ORAL
  Filled 2011-02-18 (×3): qty 1

## 2011-02-18 MED ORDER — LEVETIRACETAM 500 MG PO TABS
250.0000 mg | ORAL_TABLET | Freq: Two times a day (BID) | ORAL | Status: DC
  Administered 2011-02-18: 250 mg via ORAL
  Administered 2011-02-18: 500 mg via ORAL
  Administered 2011-02-19: 250 mg via ORAL
  Filled 2011-02-18: qty 2
  Filled 2011-02-18 (×2): qty 1

## 2011-02-18 NOTE — Progress Notes (Signed)
Subjective:  [name] feels well today. He feels as if the jerks have diminished. He complains of insomnia and would like medication for his insomnia. Otherwise he feels fine.   Objective: Vital signs in last 24 hours: Filed Vitals:   02/17/11 1004 02/17/11 1400 02/17/11 2053 02/18/11 0542  BP:  128/78 149/73 150/75  Pulse:  64 55 57  Temp:  97.2 F (36.2 C) 98.3 F (36.8 C) 98.4 F (36.9 C)  TempSrc:  Oral    Resp:  18 20 20   Height:      Weight:      SpO2: 96% 96% 99% 94%    Intake/Output Summary (Last 24 hours) at 02/18/11 0858 Last data filed at 02/17/11 1748  Gross per 24 hour  Intake   2920 ml  Output      0 ml  Net   2920 ml    Weight change:   Exam: General: 55 year old Caucasian man who appears to be older than his stated age. Patient is alert today. Lungs: Decreased breath sounds in the bases but clear anteriorly. Heart: S1, S2, with a 2/6 systolic murmur. Abdomen: Morbidly obese, positive bowel sounds, nontender, nondistended. Extremities: No pedal edema. Neuro: No tremor. Alert and oriented x3. Cranial nerves II through XII are grossly intact. Gait: Not tested.     Lab Results: Basic Metabolic Panel:  Basename 02/18/11 0533 02/17/11 0621  NA 140 140  K 3.6 3.6  CL 100 101  CO2 31 32  GLUCOSE 130* 148*  BUN 23 30*  CREATININE 1.71* 1.75*  CALCIUM 9.4 9.3  MG -- --  PHOS -- --   Liver Function Tests:  [facility] 02/16/11 0407  AST 15  ALT 11  ALKPHOS 75  BILITOT 0.2*  PROT 6.7  ALBUMIN 3.3*   No results found for this basename: LIPASE:2,AMYLASE:2 in the last 72 hours  Basename 02/15/11 1800  AMMONIA 22   CBC:  Basename 02/17/11 0621 02/16/11 0407 02/15/11 1030  WBC 9.8 8.3 --  NEUTROABS -- -- 11.7*  HGB 11.8* 11.9* --  HCT 35.8* 36.5* --  MCV 87.5 87.7 --  PLT 215 229 --   Cardiac Enzymes:  Basename 02/16/11 0407 02/15/11 1740  CKTOTAL 186 273*  CKMB 2.2 4.6*  CKMBINDEX -- --  TROPONINI <0.30 <0.30   BNP: No results  found for this basename: POCBNP:3 in the last 72 hours D-Dimer: No results found for this basename: DDIMER:2 in the last 72 hours CBG:  Basename 02/18/11 0814 02/18/11 0358 02/17/11 2351 02/17/11 1952 02/17/11 1628 02/17/11 1137  GLUCAP 174* 103* 265* 192* 227* 171*   Hemoglobin A1C:  Basename 02/15/11 1135  HGBA1C 7.0*   Fasting Lipid Panel: No results found for this basename: CHOL,HDL,LDLCALC,TRIG,CHOLHDL,LDLDIRECT in the last 72 hours Thyroid Function Tests:  Basename 02/15/11 1135  TSH 0.535  T4TOTAL --  FREET4 1.22  T3FREE --  THYROIDAB --   Anemia Panel:  Basename 02/15/11 1135  VITAMINB12 627  FOLATE --  FERRITIN --  TIBC --  IRON --  RETICCTPCT --   Urine Drug Screen: Negative  Alcohol Level:  Basename 02/15/11 1135  ETH <11   Urinalysis: 3-6 WBCs 3-6 RBCs  Misc. Labs:   Micro: Recent Results (from the past 240 hour(s))  URINE CULTURE     Status: Normal   Collection Time   02/15/11  5:55 PM      Component Value Range Status Comment   Specimen Description URINE, CLEAN CATCH   Final  Special Requests NONE   Final    Setup Time [id number]   Final    Colony Count 60,000 COLONIES/ML   Final    Culture     Final    Value: Multiple bacterial morphotypes present, none predominant. Suggest appropriate recollection if clinically indicated.   Report Status 02/17/2011 FINAL   Final     Studies/Results: Mr Brain Wo Contrast  02/16/2011  *RADIOLOGY REPORT*  Clinical Data: Recent fall, with dizziness, and multiple episodes of syncope.  Diabetes mellitus.  Hypertension.  Morbid obesity.  MRI HEAD WITHOUT CONTRAST  Technique:  Multiplanar, multiecho pulse sequences of the brain and surrounding structures were obtained according to standard protocol without intravenous contrast.  Comparison: CT head 02/15/2011.  CT face 02/14/2011.  MR head 11/23/2008.  Findings: No acute stroke, intracranial hemorrhage, mass lesion, hydrocephalus, or extra-axial fluid. Mild  atrophy is present. Minimal white matter disease is noted.  No foci of chronic hemorrhage.  Carotid and basilar arteries are widely patent. Coronal imaging demonstrates a fracture of the orbital floor on the right.  There appears to be extension of orbital fat through this defect consistent with an inferior blowout fracture.   There is no significant proptosis.  Normal pituitary and cerebellar tonsils.  No acute sinus or mastoid disease.  Little change from prior CT or MRI.  IMPRESSION: An inferior blowout fracture of the right orbit is redemonstrated. There appears to be herniation of orbital fat downward in the right maxillary sinus.  Mild atrophy with minimal white matter disease.  No evidence for acute stroke.  Original Report Authenticated By: Elsie Stain, M.D.    Medications: I have reviewed the patient's current medications.  Assessment:  Delirium  Tremor  ARF (acute renal failure)  CKD (chronic kidney disease), stage III  Leukocytosis  OSA on CPAP  Chronic systolic heart failure  HTN (hypertension)  DM type 2 with diabetic peripheral neuropathy  Polypharmacy  Anemia  UTI (lower urinary tract infection)  Drug toxicity  Insomnia  Delirium secondary to polypharmacy.  Please see previous note by Dr. Sherrie Mustache. The patient's delirium was most likely secondary to his multiple medications. His medications have been adjusted and he feels a lot better today. Will continue him on his medications at present dose.   Tremor.  Currently resolved.  Urinary tract infection. On Rocephin.  Acute renal failure superimposed on chronic kidney disease. His creatinine is at baseline on gentle IV fluids.  Will discontinue his fluids today.   Chronic systolic congestive heart failure.  Currently compensated. His cardiac enzymes were within normal limits. Metoprolol and spironolactone and Lasix were  Restarted. Discontinue IV fluids.   Hypertension. His blood pressure is trending up.  Continue to  monitor.   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  Currently stable.  Normocytic anemia. Stable   Obstructive sleep apnea  on CPAP chronically.  Chronic pain syndrome, opioid dependent.  medications being adjusted.   Tobacco abuse. Patient advised to quit.  Subacute right orbital fracture.  This occurred a couple of weeks ago from a fall. The patient was referred to an ophthalmologist as an outpatient.  Insomnia: Will continue his Klonopin will give him a dose prior to bedtime for his insomnia. Anticipated discharge within the next 1-3 days.   LOS: 3 days   Earlene Plater MD, Ladell Pier 02/18/2011, 8:58 AM

## 2011-02-19 ENCOUNTER — Ambulatory Visit: Payer: Medicare Other | Admitting: Urgent Care

## 2011-02-19 LAB — GLUCOSE, CAPILLARY
Glucose-Capillary: 146 mg/dL — ABNORMAL HIGH (ref 70–99)
Glucose-Capillary: 155 mg/dL — ABNORMAL HIGH (ref 70–99)

## 2011-02-19 LAB — CBC
MCH: 28.4 pg (ref 26.0–34.0)
MCHC: 32.4 g/dL (ref 30.0–36.0)
Platelets: 220 10*3/uL (ref 150–400)
RDW: 13.7 % (ref 11.5–15.5)

## 2011-02-19 LAB — BASIC METABOLIC PANEL
Calcium: 9.4 mg/dL (ref 8.4–10.5)
GFR calc Af Amer: 43 mL/min — ABNORMAL LOW (ref >90)
GFR calc non Af Amer: 37 mL/min — ABNORMAL LOW (ref >90)
Glucose, Bld: 126 mg/dL — ABNORMAL HIGH (ref 70–99)
Potassium: 3.4 mEq/L — ABNORMAL LOW (ref 3.5–5.1)
Sodium: 139 mEq/L (ref 135–145)

## 2011-02-19 MED ORDER — GABAPENTIN 100 MG PO CAPS: 200.0000 mg | ORAL_CAPSULE | Freq: Two times a day (BID) | ORAL | Status: DC

## 2011-02-19 MED ORDER — LEVETIRACETAM 250 MG PO TABS: 250.0000 mg | ORAL_TABLET | Freq: Two times a day (BID) | ORAL | Status: DC

## 2011-02-19 MED ORDER — SODIUM CHLORIDE 0.9 % IV SOLN
Freq: Once | INTRAVENOUS | Status: AC
  Administered 2011-02-19: 09:00:00 via INTRAVENOUS

## 2011-02-19 MED ORDER — INSULIN NPH ISOPHANE & REGULAR (70-30) 100 UNIT/ML ~~LOC~~ SUSP: 15.0000 [IU] | Freq: Two times a day (BID) | SUBCUTANEOUS | Status: DC

## 2011-02-19 MED ORDER — SODIUM CHLORIDE 0.9 % IJ SOLN: 3.0000 mL | INTRAMUSCULAR | Status: DC | PRN

## 2011-02-19 MED ORDER — TRAZODONE HCL 100 MG PO TABS: 50.0000 mg | ORAL_TABLET | Freq: Every day | ORAL | Status: DC

## 2011-02-19 MED ORDER — SODIUM CHLORIDE 0.9 % IJ SOLN
3.0000 mL | Freq: Two times a day (BID) | INTRAMUSCULAR | Status: DC
Start: 2011-02-19 — End: 2011-02-19
  Administered 2011-02-19: 3 mL via INTRAVENOUS
  Filled 2011-02-19: qty 3

## 2011-02-19 MED ORDER — POTASSIUM CHLORIDE CRYS ER 20 MEQ PO TBCR
40.0000 meq | EXTENDED_RELEASE_TABLET | Freq: Once | ORAL | Status: AC
  Administered 2011-02-19: 40 meq via ORAL
  Filled 2011-02-19: qty 2

## 2011-02-19 MED ORDER — OXYCODONE HCL 30 MG PO TABS: 30.0000 mg | ORAL_TABLET | Freq: Four times a day (QID) | ORAL | Status: DC | PRN

## 2011-02-19 MED ORDER — INSULIN NPH ISOPHANE & REGULAR (70-30) 100 UNIT/ML ~~LOC~~ SUSP: 50.0000 [IU] | Freq: Two times a day (BID) | SUBCUTANEOUS | Status: DC

## 2011-02-19 NOTE — Progress Notes (Signed)
Pt d/c home today by MD.  He reports his wife's car will not start and he has no other means of transportation nor the money to call a cab.  CSW arranged transport home via [employer].  No other CSW needs reported.  CSW to sign off.  Karn Cassis

## 2011-02-19 NOTE — Progress Notes (Signed)
Subjective: Interval History: No new complaints are reported. The patient has not had any recurrent spells of confusion. Jerking seemed to have improved significantly. He has been started on Keppra without significant side effects. He has been up walking around in the room and also he reports some mild dizziness.  Objective: Vital signs in last 24 hours: Temp:  [98 F (36.7 C)-98.3 F (36.8 C)] 98.2 F (36.8 C) (10/25 0424) Pulse Rate:  [58-69] 62  (10/25 0721) Resp:  [16-20] 16  (10/25 0721) BP: (125-158)/(72-94) 137/72 mmHg (10/25 0424) SpO2:  [94 %-97 %] 96 % (10/25 0721)  Intake/Output from previous day: 10/24 0701 - 10/25 0700 In: 420 [P.O.:420] Out: -  Intake/Output this shift:   Nutritional status: Carb Control  PE GENERAL: Pleasant slightly overweight man in no acute distress.  HEENT: Supple. Normocephalic. Area of healing bruise right maxillary region.  ABDOMEN: soft  EXTREMITIES: No edema  BACK: Normal.  SKIN: Normal by inspection.  MENTAL STATUS: Alert and oriented. Speech, language and cognition are generally intact. Judgment and insight normal.  MOTOR: Normal tone, bulk and strength - except the hands bilaterally; no pronator drift. The hands are noted to have significant atrophy of the first ulcer interosseous muscles bilaterally. In fact, there is atrophy of all the dorsal interossei muscles bilaterally. No fasciculations are noted.  COORDINATION: Left finger to nose is normal, right finger to nose is normal, No rest tremor; no intention tremor; no postural tremor; no bradykinesia.  Lab Results:  Basename 02/19/11 0603 02/18/11 0533 02/17/11 0621  WBC 6.8 -- 9.8  HGB 12.2* -- 11.8*  HCT 37.6* -- 35.8*  PLT 220 -- 215  NA 139 140 --  K 3.4* 3.6 --  CL 100 100 --  CO2 30 31 --  GLUCOSE 126* 130* --  BUN 24* 23 --  CREATININE 1.92* 1.71* --  CALCIUM 9.4 9.4 --  LABA1C -- -- --   Lipid Panel No results found for this basename:  CHOL,TRIG,HDL,CHOLHDL,VLDL,LDLCALC in the last 72 hours  Studies/Results: No results found.  Medications:  Scheduled Meds:    . sodium chloride   Intravenous Once  . amLODipine  2.5 mg Oral TID  . cefTRIAXone (ROCEPHIN) IV  1 g Intravenous Q24H  . clonazePAM  0.25 mg Oral BID  . furosemide  40 mg Oral BID  . gabapentin  200 mg Oral BID  . insulin aspart  0-9 Units Subcutaneous Q4H  . insulin aspart protamine-insulin aspart  15 Units Subcutaneous BID WC  . levETIRAcetam  250 mg Oral BID  . losartan  50 mg Oral QHS  . metoprolol succinate  25 mg Oral Daily  . pantoprazole  40 mg Oral Q1200  . rosuvastatin  10 mg Oral q1800  . sodium chloride  3 mL Intravenous Q12H  . spironolactone  25 mg Oral Daily  . DISCONTD: clonazePAM  0.25 mg Oral BID   Continuous Infusions:    . DISCONTD: dextrose 5 % and 0.9 % NaCl with KCl 20 mEq/L 50 mL/hr at 02/18/11 0706   PRN Meds:.acetaminophen, acetaminophen, albuterol, LORazepam, morphine, nitroGLYCERIN, ondansetron (ZOFRAN) IV, ondansetron, oxyCODONE, sodium chloride   Assessment/Plan: -Resolved toxin metabolic encephalopathy. This is most likely multifactorial due to the increase of clonazepam, polypharmacy and reduced clarence with increased creatinine from 2-3 months ago to 3 during his hospitalization. The patient's clonazepam has already been reduced. We agree with this reduction. -Myoclonic jerking activity. This possibly could be epileptic given the EEG findings. Done well with keppra.  LOS: 4 days   Kabella Cassidy

## 2011-02-19 NOTE — Discharge Summary (Signed)
DISCHARGE SUMMARY  [name]  MR#: [id number]  DOB:[birth year]  Date of Admission: 02/15/2011 Date of Discharge: 02/19/2011  Attending Physician:Davis MD, Ladell Pier  Patient's MWU:XLKGMW,NUU, MD, MD  Consults:Treatment Team:  Beryle Beams, MD  Discharge Diagnoses: .Delirium/Toxic metabolic encephalopathy The patient's delirium was most likely secondary to his multiple medications. His medications have been adjusted and he feels a lot better. Will discharge him on most of his home medications will stop his baclofen and is a Requip decreased his dose of trazodone and change his oxycodone to when necessary. He will follow up with his primary care doctor for further adjustments.  .Tremor/Myoclonic jerking activity This possibly could be epileptic given the EEG findings per neurology. Patient's tremor/jerks has improved. He was started on Keppra per neurology based on these EEG findings. He will followup outpatient with his PCP and neurology.   .CKD (chronic kidney disease), stage III/ARF (acute renal failure) Patient's creatinine was over 3 on admission. His baseline is 2.5. His medications such as Lasix was placed on hold. His creatinine improved. Will resume his home dose of Lasix and we will follow up with his primary care physician within a week.  .Leukocytosis has resolved.   .OSA: Continue on CPAP  .Chronic systolic heart failure Stable   .HTN (hypertension) Continue home medication.  .DM type 2 with diabetic peripheral neuropathy Patient was on much higher doses of insulin at home. In the hospital his blood sugars have been low. His insulin was decreased to 15 units twice daily. If his blood sugar increases when he goes back to his routine diet and is insulin can be increased.   .Anemia Hemoglobin has been stable.   Marland KitchenUTI (lower urinary tract infection) Patient was treated inpatient with IV Rocephin for greater than 3 days.   . Hypokalemia Repleted prior to  discharge.    Current Discharge Medication List    START taking these medications   Details  levETIRAcetam (KEPPRA) 250 MG tablet Take 1 tablet (250 mg total) by mouth 2 (two) times daily. Qty: 60 tablet, Refills: 0      CONTINUE these medications which have CHANGED   Details  gabapentin (NEURONTIN) 100 MG capsule Take 2 capsules (200 mg total) by mouth 2 (two) times daily at 10 am and 4 pm. Qty: 90 capsule, Refills: 0    insulin NPH-insulin regular (NOVOLIN 70/30) (70-30) 100 UNIT/ML injection Inject 15 Units into the skin 2 (two) times daily. Qty: 3 mL, Refills: 0    oxycodone (ROXICODONE) 30 MG immediate release tablet Take 1 tablet (30 mg total) by mouth every 6 (six) hours as needed for pain. Qty: 30 tablet, Refills: 0    traZODone (DESYREL) 100 MG tablet Take 0.5 tablets (50 mg total) by mouth at bedtime. Qty: 30 tablet, Refills: 0      CONTINUE these medications which have NOT CHANGED   Details  amLODipine (NORVASC) 2.5 MG tablet Take 2.5 mg by mouth 3 (three) times daily.     clonazePAM (KLONOPIN) 0.5 MG tablet Take 0.5 mg by mouth 2 (two) times daily.     docusate sodium (COLACE) 100 MG capsule Take 100-200 mg by mouth 2 (two) times daily. Take 2 tablets every morning and 1 tablet at bedtime    FLUoxetine (PROZAC) 40 MG capsule Take 40 mg by mouth daily.     furosemide (LASIX) 40 MG tablet Take 40-80 mg by mouth 2 (two) times daily. Patient takes 80mg  every morning and then 40mg  in the evening if needed  for fluid    losartan (COZAAR) 100 MG tablet Take 50 mg by mouth at bedtime.     metoprolol succinate (TOPROL-XL) 25 MG 24 hr tablet Take 12.5 tablets by mouth at bedtime.     Multiple Vitamins-Minerals (CENTRUM SILVER ULTRA MENS) TABS Take 1 tablet by mouth daily.      nitroGLYCERIN (NITROSTAT) 0.4 MG SL tablet Place 0.4 mg under the tongue every 5 (five) minutes x 3 doses as needed. For chest pain    simvastatin (ZOCOR) 40 MG tablet Take 20 mg by mouth at  bedtime.     spironolactone (ALDACTONE) 25 MG tablet Take 25 mg by mouth at bedtime.     nicotine (NICODERM CQ - DOSED IN MG/24 HOURS) 21 mg/24hr patch Place 1 patch onto the skin daily.      ondansetron (ZOFRAN) 4 MG tablet Take 4 mg by mouth every 8 (eight) hours as needed. For nausea     pantoprazole (PROTONIX) 40 MG tablet Take 1 tablet by mouth every morning.     triamcinolone (KENALOG) 0.5 % cream Apply 1 application topically.       STOP taking these medications     baclofen (LIORESAL) 10 MG tablet      hydrOXYzine (ATARAX/VISTARIL) 10 MG tablet      ibuprofen (ADVIL,MOTRIN) 200 MG tablet      rOPINIRole (REQUIP) 4 MG tablet      rOPINIRole (REQUIP) 2 MG tablet           Hospital Course: Present on Admission:  .Delirium .Tremor .ARF (acute renal failure) .CKD (chronic kidney disease), stage III .Leukocytosis .OSA on CPAP .Chronic systolic heart failure .HTN (hypertension) .DM type 2 with diabetic peripheral neuropathy .Anemia .UTI (lower urinary tract infection) .Drug toxicity   Day of Discharge BP 137/72  Pulse 62  Temp(Src) 98.2 F (36.8 C) (Oral)  Resp 16  Ht 6' (1.829 m)  Wt 119.1 kg (262 lb 9.1 oz)  BMI 35.61 kg/m2  SpO2 96%  Physical Exam: General: 55 year old Caucasian man who appears to be older than his stated age. Patient is alert today.  Lungs: Decreased breath sounds in the bases but clear anteriorly.  Heart: S1, S2, with a 2/6 systolic murmur.  Abdomen: Morbidly obese, positive bowel sounds, nontender, nondistended.  Extremities: No pedal edema.  Neuro: No tremor. Alert and oriented x3. Cranial nerves II through XII are grossly intact.  Gait: Not tested.    Results for orders placed during the hospital encounter of 02/15/11 (from the past 24 hour(s))  GLUCOSE, CAPILLARY     Status: Abnormal   Collection Time   02/18/11 12:27 PM      Component Value Range   Glucose-Capillary 161 (*) 70 - 99 (mg/dL)  GLUCOSE, CAPILLARY      Status: Abnormal   Collection Time   02/18/11  5:09 PM      Component Value Range   Glucose-Capillary 222 (*) 70 - 99 (mg/dL)  GLUCOSE, CAPILLARY     Status: Abnormal   Collection Time   02/18/11  9:19 PM      Component Value Range   Glucose-Capillary 167 (*) 70 - 99 (mg/dL)   Comment 1 Notify RN    GLUCOSE, CAPILLARY     Status: Abnormal   Collection Time   02/19/11 12:09 AM      Component Value Range   Glucose-Capillary 166 (*) 70 - 99 (mg/dL)   Comment 1 Notify RN    GLUCOSE, CAPILLARY     Status: Abnormal  Collection Time   02/19/11  4:24 AM      Component Value Range   Glucose-Capillary 146 (*) 70 - 99 (mg/dL)   Comment 1 Notify RN    CBC     Status: Abnormal   Collection Time   02/19/11  6:03 AM      Component Value Range   WBC 6.8  4.0 - 10.5 (K/uL)   RBC 4.30  4.22 - 5.81 (MIL/uL)   Hemoglobin 12.2 (*) 13.0 - 17.0 (g/dL)   HCT 16.1 (*) 09.6 - 52.0 (%)   MCV 87.4  78.0 - 100.0 (fL)   MCH 28.4  26.0 - 34.0 (pg)   MCHC 32.4  30.0 - 36.0 (g/dL)   RDW 04.5  40.9 - 81.1 (%)   Platelets 220  150 - 400 (K/uL)  BASIC METABOLIC PANEL     Status: Abnormal   Collection Time   02/19/11  6:03 AM      Component Value Range   Sodium 139  135 - 145 (mEq/L)   Potassium 3.4 (*) 3.5 - 5.1 (mEq/L)   Chloride 100  96 - 112 (mEq/L)   CO2 30  19 - 32 (mEq/L)   Glucose, Bld 126 (*) 70 - 99 (mg/dL)   BUN 24 (*) 6 - 23 (mg/dL)   Creatinine, Ser 9.14 (*) 0.50 - 1.35 (mg/dL)   Calcium 9.4  8.4 - 78.2 (mg/dL)   GFR calc non Af Amer 37 (*) >90 (mL/min)   GFR calc Af Amer 43 (*) >90 (mL/min)  GLUCOSE, CAPILLARY     Status: Abnormal   Collection Time   02/19/11  7:28 AM      Component Value Range   Glucose-Capillary 117 (*) 70 - 99 (mg/dL)    Disposition: Home   Follow-up Appts: Discharge Orders    Future Appointments: Provider: Department: Dept Phone: Center:   03/11/2011 10:30 AM Gerrit Halls, NP [facility] [phone] [facility]      Follow-up with Cristie Hem, MD, MD  in1weeks. Follow up with Psychiatry in 1 week Followup with neurology with in 1 to 2 weeks.  Tests Needing Follow-up: BMP   Signed: Earlene Plater MD, Ladell Pier 02/19/2011, 10:32 AM

## 2011-03-10 ENCOUNTER — Encounter: Payer: Self-pay | Admitting: Internal Medicine

## 2011-03-11 ENCOUNTER — Encounter: Payer: Self-pay | Admitting: Gastroenterology

## 2011-03-11 ENCOUNTER — Ambulatory Visit (INDEPENDENT_AMBULATORY_CARE_PROVIDER_SITE_OTHER): Payer: Medicare Other | Admitting: Gastroenterology

## 2011-03-11 VITALS — BP 115/72 | HR 70 | Temp 97.5°F | Ht 72.0 in | Wt 258.2 lb

## 2011-03-11 DIAGNOSIS — K5900 Constipation, unspecified: Secondary | ICD-10-CM

## 2011-03-11 DIAGNOSIS — R634 Abnormal weight loss: Secondary | ICD-10-CM

## 2011-03-11 DIAGNOSIS — D72829 Elevated white blood cell count, unspecified: Secondary | ICD-10-CM

## 2011-03-11 MED ORDER — ONDANSETRON HCL 4 MG PO TABS: 4.0000 mg | ORAL_TABLET | Freq: Three times a day (TID) | ORAL | Status: DC | PRN

## 2011-03-11 MED ORDER — LUBIPROSTONE 8 MCG PO CAPS: 8.0000 ug | ORAL_CAPSULE | Freq: Two times a day (BID) | ORAL | Status: AC

## 2011-03-11 NOTE — Patient Instructions (Signed)
We are ordering some blood work as well as a stool sample. We will call you with these results.  Start taking Amitiza WITH FOOD to avoid nausea, in the morning and at night. Avoid if diarrhea. Samples have been provided. There is patient assistance forms as well if needed. If this works, please contact our office to let us know.   The medication Reglan could potentially interact with some medicines you are already on; therefore, I have ordered some more Zofran for you to take as needed for nausea.  We will be in touch very shortly regarding the next steps. We may be doing more xrays and such to find out why you are having such weight loss.

## 2011-03-11 NOTE — Progress Notes (Signed)
Referring Provider: Cristie Hem, MD Primary Care Physician:  Cristie Hem, MD, MD Primary Gastroenterologist: Dr. Jena Gauss   Chief Complaint  Patient presents with  . Follow-up  . Emesis  . Constipation  . Abdominal Pain    HPI:   [name] presents today in f/u for newly diagnosed gastroparesis. GES in August showed GOO vs severe gastroparesis. This was dicussed with Dr. Jena Gauss at the time; due to pt's clinical symptoms and recent EGD in March, it was treated as gastroparesis. He clinically did not present with gastric outlet obstruction. He was given a gastroparesis diet to follow and instructions to return in 4 weeks. However, he did not and is just returning today.  He has lost an additional 36 lbs from August till now. He was hospitalized late October secondary to toxic metabolic encephalopathy, thought to be related to interactions from his multiple medications. He also was treated for acute renal failure/CKD stage III. Since March, his total weight loss is 48 lbs, unintentionally. He has both an EGD and colonoscopy on file from this year. Slight normocytic anemia has been noted.   He appears today weak, downcast. He is sitting in the chair with his head bowed. This is in stark contrast to prior visit, where he was quite animated and bubbly. He reports vomiting daily to every other day. No hematemesis. Bilious. States stomach cramping in LUQ intermittently. Goes up to 4 days with BM, drinking plenty of fluids. Hard, "nubby" stools. Mild amount of hematochezia if severely straining. Takes stool softeners, Miralax, fiber.  Reports no appetite. +nausea. States he is depressed, doesn't want to leave the house. See PCP end of November. Finances extremely tight. Was unable to follow-up with appropriate specialists after hospitalization due to lack of finances. Has 8 dollars left till the end of the month.     Aug 14 GES: No gastric emptying identified 2 hours following ingestion of a standardized  meal.  This can be seen with gastroparesis and with gastric outlet obstruction.    Past Medical History  Diagnosis Date  . Diabetes mellitus   . Hypertension   . CHF (congestive heart failure)   . CRD (chronic renal disease)   . Arthritis   . Obesity   . Depression   . Sleep apnea   . Diabetic neuropathy   . CAD (coronary artery disease)   . Mitral regurgitation   . Esophagitis, reflux 07/07/10    MW tear, erosive reflux esophagitis, 2cm hh  . S/P colonoscopy April 2012    [name]: torturous colon, hyperplastic polyps  . Collagen vascular disease     "myoclonic "  . Tremor     Past Surgical History  Procedure Date  . Neck surgery     discectomy  . Hand surgery     bilateral  . Colonoscopy before 2002    [place]  . Appendectomy   . Other surgical history     bone graft-knee  . Anterior lumbar corpectomy w/ fusion     C5-6/C4-7  . Colonoscopy 08/21/2010    elongaed tortuous colon otherwise normal    Current Outpatient Prescriptions  Medication Sig Dispense Refill  . amLODipine (NORVASC) 2.5 MG tablet Take 2.5 mg by mouth 3 (three) times daily.       . clonazePAM (KLONOPIN) 0.5 MG tablet Take 0.5 mg by mouth 2 (two) times daily.       Marland Kitchen docusate sodium (COLACE) 100 MG capsule Take 100-200 mg by mouth 2 (two) times daily. Take 2 tablets  every morning and 1 tablet at bedtime      . FLUoxetine (PROZAC) 40 MG capsule Take 40 mg by mouth daily.       . furosemide (LASIX) 40 MG tablet Take 40-80 mg by mouth 2 (two) times daily. Patient takes 80mg  every morning and then 40mg  in the evening if needed for fluid      . gabapentin (NEURONTIN) 100 MG capsule Take 2 capsules (200 mg total) by mouth 2 (two) times daily at 10 am and 4 pm.  90 capsule  0  . insulin NPH-insulin regular (NOVOLIN 70/30) (70-30) 100 UNIT/ML injection Inject 50 Units into the skin 2 (two) times daily.        Marland Kitchen levETIRAcetam (KEPPRA) 250 MG tablet Take 1 tablet (250 mg total) by mouth 2 (two) times daily.  60  tablet  0  . losartan (COZAAR) 100 MG tablet Take 50 mg by mouth at bedtime.       . metoprolol succinate (TOPROL-XL) 25 MG 24 hr tablet Take 12.5 tablets by mouth at bedtime.       . Multiple Vitamins-Minerals (CENTRUM SILVER ULTRA MENS) TABS Take 1 tablet by mouth daily.        . nicotine (NICODERM CQ - DOSED IN MG/24 HOURS) 21 mg/24hr patch Place 1 patch onto the skin daily.        . nitroGLYCERIN (NITROSTAT) 0.4 MG SL tablet Place 0.4 mg under the tongue every 5 (five) minutes x 3 doses as needed. For chest pain      . ondansetron (ZOFRAN) 4 MG tablet Take 4 mg by mouth every 8 (eight) hours as needed. For nausea       . oxycodone (ROXICODONE) 30 MG immediate release tablet Take 1 tablet (30 mg total) by mouth every 6 (six) hours as needed for pain.  30 tablet  0  . pantoprazole (PROTONIX) 40 MG tablet Take 1 tablet by mouth every morning.       . simvastatin (ZOCOR) 40 MG tablet Take 20 mg by mouth at bedtime.       Marland Kitchen spironolactone (ALDACTONE) 25 MG tablet Take 25 mg by mouth at bedtime.       . traZODone (DESYREL) 100 MG tablet Take 0.5 tablets (50 mg total) by mouth at bedtime.  30 tablet  0  . triamcinolone (KENALOG) 0.5 % cream Apply 1 application topically.         Allergies as of 03/11/2011  . (No Known Allergies)    Family History  Problem Relation Age of Onset  . Stroke Father 33  . Stroke Mother 87  . Colon cancer Neg Hx   . Crohn's disease Neg Hx   . Cirrhosis Neg Hx   . Ulcerative colitis Neg Hx   . Stomach cancer Neg Hx     History   Social History  . Marital Status: Married    Spouse Name: N/A    Number of Children: 1  . Years of Education: N/A   Occupational History  . disabled    Social History Main Topics  . Smoking status: Current Everyday Smoker -- 0.2 packs/day for 40 years    Types: Cigarettes  . Smokeless tobacco: Never Used   Comment: ordered nicotine patches. start date is 27 July 2010  . Alcohol Use: No  . Drug Use: No  . Sexually Active:  Yes     erectile dysfunction   Other Topics Concern  . None   Social History Narrative  Lost one dgt due to homicide.    Review of Systems: Gen: SEE HPI CV: Denies chest pain, palpitations, syncope, peripheral edema, and claudication. Resp: Denies dyspnea at rest, + CHRONIC cough, wheezing, coughing up blood, and pleurisy. GI: SEE HPI Derm: Denies rash, itching, dry skin Psych: Denies depression, anxiety, memory loss, confusion. No homicidal or suicidal ideation.  Heme: Denies bruising, bleeding, and enlarged lymph nodes.  Physical Exam: BP 115/72  Pulse 70  Temp(Src) 97.5 F (36.4 C) (Temporal)  Ht 6' (1.829 m)  Wt 258 lb 3.2 oz (117.119 kg)  BMI 35.02 kg/m2 General:   Alert and oriented. Appears tired, lethargic. Depressed affect. Denies suicidality.  Head:  Normocephalic and atraumatic. Eyes:  Conjuctiva clear without scleral icterus. Mouth:  Oral mucosa pink and moist. Good dentition. No lesions. Neck:  Supple, ?submandibular adenopathy right side, non-tender, non-fixed Heart:  S1, S2 present without murmurs, rubs, or gallops. Regular rate and rhythm. Abdomen:  +BS, soft, LUQ TTP and non-distended. No rebound or guarding. No HSM or masses noted. Msk:  Symmetrical without gross deformities. Normal posture. Extremities:  Without edema. Neurologic:  Alert and  oriented x4;  grossly normal neurologically. Skin:  Intact without significant lesions or rashes. Cervical Nodes:  ? Submandibular adenopathy right side, non-tender, non-fixed Psych:  Alert and cooperative. Normal mood and affect.

## 2011-03-12 ENCOUNTER — Encounter: Payer: Self-pay | Admitting: Gastroenterology

## 2011-03-12 DIAGNOSIS — R634 Abnormal weight loss: Secondary | ICD-10-CM | POA: Insufficient documentation

## 2011-03-12 DIAGNOSIS — K5900 Constipation, unspecified: Secondary | ICD-10-CM | POA: Insufficient documentation

## 2011-03-12 LAB — COMPREHENSIVE METABOLIC PANEL
ALT: 11 U/L (ref 0–53)
AST: 17 U/L (ref 0–37)
Calcium: 10.5 mg/dL (ref 8.4–10.5)
Chloride: 93 mEq/L — ABNORMAL LOW (ref 96–112)
Creat: 3.1 mg/dL — ABNORMAL HIGH (ref 0.50–1.35)
Sodium: 141 mEq/L (ref 135–145)
Total Bilirubin: 0.3 mg/dL (ref 0.3–1.2)
Total Protein: 7.6 g/dL (ref 6.0–8.3)

## 2011-03-12 LAB — IRON AND TIBC
%SAT: 12 % — ABNORMAL LOW (ref 20–55)
Iron: 37 ug/dL — ABNORMAL LOW (ref 42–165)
UIBC: 281 ug/dL (ref 125–400)

## 2011-03-12 LAB — CBC WITH DIFFERENTIAL/PLATELET
Basophils Absolute: 0.1 10*3/uL (ref 0.0–0.1)
Eosinophils Relative: 3 % (ref 0–5)
Lymphocytes Relative: 15 % (ref 12–46)
MCV: 88.9 fL (ref 78.0–100.0)
Neutro Abs: 8.4 10*3/uL — ABNORMAL HIGH (ref 1.7–7.7)
Neutrophils Relative %: 74 % (ref 43–77)
Platelets: 260 10*3/uL (ref 150–400)
RBC: 4.78 MIL/uL (ref 4.22–5.81)
RDW: 14.1 % (ref 11.5–15.5)
WBC: 11.4 10*3/uL — ABNORMAL HIGH (ref 4.0–10.5)

## 2011-03-12 LAB — FERRITIN: Ferritin: 129 ng/mL (ref 22–322)

## 2011-03-12 NOTE — Assessment & Plan Note (Signed)
48 lbs wt loss since March 2012. 36 of these pounds were from August to November. Recently diagnosed in Aug with likely diabetic gastroparesis. Pt is obese to begin with, but this wt loss if unintentional and concerning. Gastroparesis not adequately controlled at this time; Reglan not an option due to interactions with medications he is on. Question if wt loss solely r/t this etiology, as he reports depression and wife confirms this. Concern for underlying etiology as well; however, it is encouraging that he has had both an upper and lower GI evaluation. Normocytic anemia noted in past, mild, but also intermittent leukocytosis. Question reactive vs other etiology. Finances are a major hindrance at this point; I'm afraid he is not compliant with follow-ups as ordered secondary to lack of gas money, funds for co-pay, funds for medications. Chronic cough also concerning; may consider chest CT in near future. CT abd fairly recent and on file.   Labs:  CBC, TSH, CMP, anemia panel ifobt Zofran prn Consider Domperidone (financial constraints may hinder)

## 2011-03-12 NOTE — Progress Notes (Unsigned)
  Reviewed labs with RMR. We need to get pt to see PCP at latest early next week. I have already asked pt about this, and he is financially strapped.  Please ask him if he is at all able to do this. Let's try to get him in to see Dr. Jiles Garter early next week.  Also, please send the CMP to his PCP. Finally, I have asked him to hold his lasix this evening, the 15th, and tomorrow morning, the 16th. Let's have him hold off on the Lasix for now completely. I'm worried he is drying out, looking at his labs. He needs to sip on fluids, follow the gastroparesis diet. Monitor for signs of edema like lower extremity swelling, SOB. If he notices this, he needs to either contact us or seek medical attention, especially if the weekend. I'd like to get an updated BMP on Monday if possible. After that, we can restart lasix.

## 2011-03-12 NOTE — Assessment & Plan Note (Signed)
Intermittent leukocytosis noted in review of CBC. ?reactive? May need further hematology evaluation in near future.

## 2011-03-12 NOTE — Assessment & Plan Note (Signed)
Inadequate results with Miralax, stool softeners, high fiber diet. Incorporate Amitiza 8 mcg BID. May need to increase to 24 mcg. Provided samples. If this works, will help facilitate pt assistance for him due to financial constraints.

## 2011-03-12 NOTE — Progress Notes (Signed)
Quick Note:  Called and spoke to pt personally. He has a hx of chronic kidney disease stage III. BUN/Cr elevated, as well as CO2. Basline Cr is around 2.5 He takes Lasix 80 mg in am and 40 in pm. He has lost a tremendous amount of wt since first being seen at our office.   He states he feels better today than yesterday. Denies confusion, mental status changes (recently d/c from hospital Oct 2012 secondary to metabolic encephalopathy, ?med related), no SOB, lower extremity edema. He just got up a few hours ago. Able to eat a few pieces of toast, 1/2 glass of water, large cup of coffee, 1/2 glass juice. No vomiting.   States he has 8 dollars left till the 26th of the month. Had to just pay the electric bill, unable to see PCP tomorrow or any earlier than already scheduled on the 26th. Wife has colonoscopy in am in [place]; he is trying to borrow 20$ for gas to get there.   He is concerned something may be wrong with his house or environment. States he and his wife have declined considerably in the last 4 years since moving into the trailer. Have electric heat. States wife is sometimes disoriented, sleeps all day. At appt yesterday, she fell asleep during visit. Seems to be overly medicated.   Due to significant wt loss, likely volume depletion, I've asked him to hold his pm and am dose of Lasix. Resume Lasix at 40 mg on 11/16 in the evening. Continue 40 BID instead of 80. Instructed to call our office with any issues; also to monitor for mental status changes, abdominal pain, inability to tolerate po, SOB. Hx of HF in past but well compensated. During PE yesterday, no lower extremity edema, PE overall benign.   Further work-up for wt loss to be undertaken in the near future. Will be discussing case with Dr. Jena Gauss. Pt instructed to seek medical attention immediately if any issues. Due to finances, he states he is unable to go anywhere or be seen right now. ______

## 2011-03-13 NOTE — Progress Notes (Signed)
Tried to call pt- home number has been disconnected and cell number is the wrong number. Will mail letter to pt.

## 2011-03-16 NOTE — Progress Notes (Signed)
Cc to PCP 

## 2011-03-18 ENCOUNTER — Other Ambulatory Visit: Payer: Self-pay | Admitting: Gastroenterology

## 2011-03-18 DIAGNOSIS — R799 Abnormal finding of blood chemistry, unspecified: Secondary | ICD-10-CM

## 2011-03-18 NOTE — Progress Notes (Signed)
Pt called office. He has been feeling bad for about 3 days but is feeling a little better now. He has not taken his lasix since he talked to [facility]. He is not having any edema but he is having some SOB. He said he will go this Monday and have lab done. Lab order faxed to lab. His new number is [phone]

## 2011-03-18 NOTE — Progress Notes (Signed)
Per RMR- if SOB is really bad he needs to go to ED. If its not really bad he needs to see his pcp by Friday. Spoke with pt, he said he already has appt with pcp next month. He said he will not have any money until the 15th of next month and will not be able to go until he has some money. He said he would just "deal with it". Strongly encouraged pt to go to ED if the SOB worsens. He said he would.

## 2011-03-24 NOTE — Progress Notes (Signed)
Reviewed above. I was out last week. Pt has not had labs done as instructed. Worried about compliance at this point. However, there are financial stressors that are likely the largest hurdle. Imperative that he sees his PCP ASAP; however, he does not remember when his next appt is. He also cancelled some doctor's appt today due to not feeling well. He denies any SOB, lower extremity edema, chest pain. Pt taking one Lasix 40 mg each am. Complains of flu-like symptoms. I asked if he could have CMP drawn tomorrow. Labs are waiting for him. He states "depends on if I got into town or not".   Please call pt's PCP and see if he has an appt scheduled there.

## 2011-03-26 ENCOUNTER — Other Ambulatory Visit: Payer: Self-pay | Admitting: Gastroenterology

## 2011-03-27 LAB — BASIC METABOLIC PANEL
BUN: 35 mg/dL — ABNORMAL HIGH (ref 6–23)
Creat: 2.39 mg/dL — ABNORMAL HIGH (ref 0.50–1.35)

## 2011-03-30 NOTE — Progress Notes (Signed)
Quick Note:  Much better. Cr almost to baseline. NEEDS TO SEE PCP. When is pt scheduled for them?  Continue Lasix 40 mg daily. ______

## 2011-03-31 LAB — COMPREHENSIVE METABOLIC PANEL
HCT: 41 %
Hemoglobin: 13.2 g/dL — AB (ref 13.5–17.5)
MCV: 86.6 fL
WBC: 7.95

## 2011-04-01 NOTE — Progress Notes (Signed)
Quick Note:  Tried to call pt on his cell number- woman answered and said that was not his number. Took that number out of his demographics. Will see if there is another good number to try. ______

## 2011-04-02 NOTE — Progress Notes (Signed)
Quick Note:  Tried to call other number listed and NA. Mailed letter to pt ______

## 2011-04-27 ENCOUNTER — Ambulatory Visit: Payer: Medicare Other | Admitting: Gastroenterology

## 2011-04-27 DIAGNOSIS — R634 Abnormal weight loss: Secondary | ICD-10-CM

## 2011-04-27 LAB — IFOBT (OCCULT BLOOD): IFOBT: NEGATIVE

## 2011-04-28 NOTE — Progress Notes (Signed)
Ordered by Tobi Bastos. Will forward.

## 2011-05-05 NOTE — Progress Notes (Signed)
Quick Note:  Tried to call pt- number has been disconnected. Will mail letter. ______

## 2011-05-05 NOTE — Progress Notes (Signed)
Quick Note:  Letter mailed to pt ______ 

## 2011-05-05 NOTE — Progress Notes (Signed)
Quick Note:  Noted. How is pt doing? Has he seen PCP yet? ______

## 2011-06-04 ENCOUNTER — Encounter: Payer: Self-pay | Admitting: Gastroenterology

## 2011-06-04 ENCOUNTER — Ambulatory Visit (INDEPENDENT_AMBULATORY_CARE_PROVIDER_SITE_OTHER): Payer: Medicare Other | Admitting: Gastroenterology

## 2011-06-04 DIAGNOSIS — R634 Abnormal weight loss: Secondary | ICD-10-CM

## 2011-06-04 DIAGNOSIS — R112 Nausea with vomiting, unspecified: Secondary | ICD-10-CM

## 2011-06-04 NOTE — Progress Notes (Signed)
Faxed to PCP
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
## 2011-06-04 NOTE — Assessment & Plan Note (Signed)
72 pound weight loss since 06/2010. Significant weight loss very worrisome. EGD 06/2010 as above. GES 11/2010, no emptying at two hours. Thought secondary to gastroparesis. Patient continues to vomit on regular basis. No reglan due to risk of side effects. Domperidone not an option. Patient not doing well with gastroparesis diet only. ?need to reevaluate upper gi tract to make sure not true obstruction rather than gastroparesis. Will discuss with Dr. Jena Gauss.  Numerous Head CT with nothing to suggest cause of chronic N/V. CXR ok. TSH ok.  Check for adrenal insufficiency. Check prealbumin.   Obtain labs and renal u/s from [place].   Further recommendations to follow.

## 2011-06-04 NOTE — Patient Instructions (Signed)
Please have your blood work done between 8am and 10am. Do not eat or drink anything after midnight the evening before your lab work. I will discuss your case with Dr. Jena Gauss.

## 2011-06-18 ENCOUNTER — Encounter: Payer: Self-pay | Admitting: Gastroenterology

## 2011-06-18 NOTE — Progress Notes (Signed)
Patient ID: [name], male   DOB: 03-21-1956, 56 y.o.   MRN: [id number]  Received records from [place]. Renal u/s 04/2011: prostatic enlargment. Labs 03/2011: Hgb A1C 8, creat 2, tbili 0.3, ap 78, ast 12, alt 18, alb 3.8, wbc 7.9, hgb 13.2, plt 262.  Awaiting cortisol and prealbumin levels.  Please remind patient to get labs done.

## 2011-06-19 ENCOUNTER — Other Ambulatory Visit: Payer: Self-pay | Admitting: Gastroenterology

## 2011-06-19 DIAGNOSIS — R634 Abnormal weight loss: Secondary | ICD-10-CM

## 2011-06-19 NOTE — Progress Notes (Addendum)
Patient ID: [name], male   DOB: 1955-11-25, 56 y.o.   MRN: [id number]  Discussed case with Dr. Jena Gauss.  He also suggested check tissue transglutimase IGA level to rule out celiac. Order put in epic. Please have patient do these labs with his other pending labs.   Dr. Jena Gauss also recommends discussing DM with PCP. Need to better control DM to help gastric emptying along. His last Hgb A1C was 8.  Stop marijuana use as this can cause cyclical vomiting.  Dr. Jena Gauss did not recommend repeating EGD at this time. Feels like symptoms are likely due to gastroparesis.   OV with Dr. Jena Gauss in 6-8 weeks.

## 2011-06-23 NOTE — Progress Notes (Signed)
Tried to call pt- LMOM 

## 2011-06-24 NOTE — Progress Notes (Signed)
Spoke with pt and his wife. Pt is aware of recommendations. Pt lost orders given at [facility], faxed all lab orders to lab.

## 2011-06-24 NOTE — Progress Notes (Signed)
Reminder in epic to schedule pt with RMR in 6-8 weeks. I'm waiting for April's schedule to be released.

## 2011-06-24 NOTE — Progress Notes (Unsigned)
Spoke with pts wife- she wanted me to let LSL know that pt has a history of renal disease. She feels like most of his problems are coming from that. She said pt is in denial that he has renal disease.   He said he would go to lab as soon as he could. Past orders and new orders faxed to lab.

## 2011-06-25 NOTE — Progress Notes (Unsigned)
Patient ID: [name], male   DOB: Apr 24, 1956, 56 y.o.   MRN: [id number] Noted. Await labs.

## 2011-06-26 LAB — CBC WITH DIFFERENTIAL/PLATELET: ALT: 18 U/L (ref 10–40)

## 2011-06-29 NOTE — Progress Notes (Signed)
Quick Note:  These labs were actually from 03/2011. Abstracted with wrong date. ______

## 2011-07-24 ENCOUNTER — Other Ambulatory Visit: Payer: Self-pay | Admitting: Gastroenterology

## 2011-07-25 LAB — PREALBUMIN: Prealbumin: 25.9 mg/dL (ref 17.0–34.0)

## 2011-07-29 ENCOUNTER — Telehealth: Payer: Self-pay

## 2011-07-29 ENCOUNTER — Encounter: Payer: Self-pay | Admitting: Internal Medicine

## 2011-07-29 NOTE — Progress Notes (Signed)
Quick Note:  No celiac. As per result note attached to cortisol level. ______

## 2011-07-29 NOTE — Telephone Encounter (Addendum)
Spoke to patient's wife about concerns:  Patient in denial about renal disease.  2005, stage II renal disease 2007, stage IV renal disease Patient has not seen renal specialist since 2007.  Lots of depression. Switching from prozac to effexor. Primary care from [place] system now.  Advised we noted this in records and keep appt with RMR 08/25/11. She reports that he has n/v every other day. Advised that he follow gastroparesis diet and call if needs zofran.

## 2011-07-29 NOTE — Telephone Encounter (Signed)
Spoke with pts wife- Glee Arvin- she stated she has some very important information to give LSL that she feels she needs to know about the pt. She wants to speak with LSL only and refused to give me any of the info. She is requesting you call her at [phone].

## 2011-07-29 NOTE — Progress Notes (Signed)
Quick Note:  No evidence of malnutrition or adrenal insufficiency.  As previously recommended, patient needs RMR only appt at end of April (see 05/2010 documentation). Please make appt. ______

## 2011-07-30 ENCOUNTER — Other Ambulatory Visit: Payer: Self-pay | Admitting: Internal Medicine

## 2011-07-30 MED ORDER — ONDANSETRON HCL 4 MG PO TABS: 4.0000 mg | ORAL_TABLET | Freq: Four times a day (QID) | ORAL | Status: DC | PRN

## 2011-07-30 NOTE — Telephone Encounter (Signed)
Pt called and said that his wife spoke with LSL yesterday and said for [place] to go ahead and call in a Zofran prescription to [facility] in [place].

## 2011-07-30 NOTE — Telephone Encounter (Signed)
done

## 2011-08-25 ENCOUNTER — Ambulatory Visit: Payer: Medicare Other | Admitting: Internal Medicine

## 2011-08-28 ENCOUNTER — Telehealth: Payer: Self-pay

## 2011-08-28 NOTE — Telephone Encounter (Signed)
Pt called- 3 days ago he had diarrhea all day. 2 days ago he started having a lot of gas. He has not had a bm in 3 days. He also stated he feels like there is a lump in the middle of his abdomen. He stated it was painful but if he moves in a certain way and expels gas that he feels a little better. No fever, no blood in diarrhea. He does have some vomiting at times but pt stated this was a chronic problem that he has all the time.   Pt stated he did not have the money to come here now or to go to the hospital. His wife seams to think he has a blockage. He wants to know if he can take a large dose of MOM to see if it will pass.   Please advise.

## 2011-08-29 NOTE — Telephone Encounter (Signed)
Do not use magnesium containing products with renal issues. Can try MiraLax for constipation. If condition worsens may need to go to the emergency room. Followup next week.

## 2011-08-31 NOTE — Telephone Encounter (Signed)
Late entry- computers were down on Friday and I was unable to document- tried to call pt- left detailed message on pts answering machine.

## 2011-09-09 ENCOUNTER — Inpatient Hospital Stay: Payer: Self-pay | Admitting: Internal Medicine

## 2011-09-09 LAB — COMPREHENSIVE METABOLIC PANEL
Anion Gap: 6 — ABNORMAL LOW (ref 7–16)
BUN: 34 mg/dL — ABNORMAL HIGH (ref 7–18)
Bilirubin,Total: 0.2 mg/dL (ref 0.2–1.0)
Calcium, Total: 8.5 mg/dL (ref 8.5–10.1)
Chloride: 110 mmol/L — ABNORMAL HIGH (ref 98–107)
Co2: 29 mmol/L (ref 21–32)
EGFR (Non-African Amer.): 29 — ABNORMAL LOW
Potassium: 4.3 mmol/L (ref 3.5–5.1)
SGOT(AST): 21 U/L (ref 15–37)
SGPT (ALT): 18 U/L
Total Protein: 6.6 g/dL (ref 6.4–8.2)

## 2011-09-09 LAB — CBC
HGB: 12.7 g/dL — ABNORMAL LOW (ref 13.0–18.0)
MCHC: 31.7 g/dL — ABNORMAL LOW (ref 32.0–36.0)
MCV: 89 fL (ref 80–100)
Platelet: 192 10*3/uL (ref 150–440)
RBC: 4.5 10*6/uL (ref 4.40–5.90)

## 2011-09-09 LAB — URINALYSIS, COMPLETE
Glucose,UR: NEGATIVE mg/dL (ref 0–75)
Ketone: NEGATIVE
Leukocyte Esterase: NEGATIVE
Nitrite: NEGATIVE
Ph: 6 (ref 4.5–8.0)
Protein: NEGATIVE
RBC,UR: <1 /HPF (ref 0–5)
Specific Gravity: 1.005 (ref 1.003–1.030)
WBC UR: <1 /HPF (ref 0–5)

## 2011-09-09 LAB — DRUG SCREEN, URINE
Amphetamines, Ur Screen: NEGATIVE (ref ?–1000)
Barbiturates, Ur Screen: NEGATIVE (ref ?–200)
Cannabinoid 50 Ng, Ur ~~LOC~~: NEGATIVE (ref ?–50)
Methadone, Ur Screen: NEGATIVE (ref ?–300)
Opiate, Ur Screen: POSITIVE (ref ?–300)
Phencyclidine (PCP) Ur S: NEGATIVE (ref ?–25)
Tricyclic, Ur Screen: NEGATIVE (ref ?–1000)

## 2011-09-09 LAB — AMMONIA: Ammonia, Plasma: <25 mcmol/L (ref 11–32)

## 2011-09-09 LAB — ETHANOL: Ethanol: <3 mg/dL

## 2011-09-09 LAB — TROPONIN I: Troponin-I: <0.02 ng/mL

## 2011-09-10 LAB — BASIC METABOLIC PANEL
Anion Gap: 6 — ABNORMAL LOW (ref 7–16)
Calcium, Total: 8.1 mg/dL — ABNORMAL LOW (ref 8.5–10.1)
Chloride: 111 mmol/L — ABNORMAL HIGH (ref 98–107)
EGFR (African American): 42 — ABNORMAL LOW
Glucose: 227 mg/dL — ABNORMAL HIGH (ref 65–99)
Potassium: 4.6 mmol/L (ref 3.5–5.1)

## 2011-09-10 LAB — MAGNESIUM: Magnesium: 2.2 mg/dL

## 2011-10-09 ENCOUNTER — Telehealth: Payer: Self-pay | Admitting: Internal Medicine

## 2011-10-09 ENCOUNTER — Ambulatory Visit: Payer: Medicare Other | Admitting: Internal Medicine

## 2011-10-09 NOTE — Telephone Encounter (Signed)
Pt was a no show

## 2011-12-03 ENCOUNTER — Emergency Department (HOSPITAL_COMMUNITY): Payer: Medicare Other

## 2011-12-03 ENCOUNTER — Encounter (HOSPITAL_COMMUNITY): Payer: Self-pay | Admitting: Emergency Medicine

## 2011-12-03 ENCOUNTER — Observation Stay (HOSPITAL_COMMUNITY)
Admission: EM | Admit: 2011-12-03 | Discharge: 2011-12-05 | Disposition: A | Discharge status: Home / Self Care | Payer: Medicare Other | Attending: Family Medicine | Admitting: Family Medicine

## 2011-12-03 DIAGNOSIS — D72829 Elevated white blood cell count, unspecified: Secondary | ICD-10-CM

## 2011-12-03 DIAGNOSIS — G8929 Other chronic pain: Secondary | ICD-10-CM | POA: Insufficient documentation

## 2011-12-03 DIAGNOSIS — M129 Arthropathy, unspecified: Secondary | ICD-10-CM

## 2011-12-03 DIAGNOSIS — D649 Anemia, unspecified: Secondary | ICD-10-CM

## 2011-12-03 DIAGNOSIS — G5600 Carpal tunnel syndrome, unspecified upper limb: Secondary | ICD-10-CM

## 2011-12-03 DIAGNOSIS — N189 Chronic kidney disease, unspecified: Secondary | ICD-10-CM

## 2011-12-03 DIAGNOSIS — R5383 Other fatigue: Secondary | ICD-10-CM | POA: Insufficient documentation

## 2011-12-03 DIAGNOSIS — F411 Generalized anxiety disorder: Secondary | ICD-10-CM

## 2011-12-03 DIAGNOSIS — R410 Disorientation, unspecified: Secondary | ICD-10-CM

## 2011-12-03 DIAGNOSIS — I252 Old myocardial infarction: Secondary | ICD-10-CM

## 2011-12-03 DIAGNOSIS — I509 Heart failure, unspecified: Secondary | ICD-10-CM

## 2011-12-03 DIAGNOSIS — F329 Major depressive disorder, single episode, unspecified: Secondary | ICD-10-CM

## 2011-12-03 DIAGNOSIS — N390 Urinary tract infection, site not specified: Secondary | ICD-10-CM

## 2011-12-03 DIAGNOSIS — G2581 Restless legs syndrome: Secondary | ICD-10-CM

## 2011-12-03 DIAGNOSIS — I129 Hypertensive chronic kidney disease with stage 1 through stage 4 chronic kidney disease, or unspecified chronic kidney disease: Secondary | ICD-10-CM | POA: Insufficient documentation

## 2011-12-03 DIAGNOSIS — M25579 Pain in unspecified ankle and joints of unspecified foot: Secondary | ICD-10-CM

## 2011-12-03 DIAGNOSIS — I2510 Atherosclerotic heart disease of native coronary artery without angina pectoris: Secondary | ICD-10-CM

## 2011-12-03 DIAGNOSIS — R4182 Altered mental status, unspecified: Principal | ICD-10-CM

## 2011-12-03 DIAGNOSIS — F528 Other sexual dysfunction not due to a substance or known physiological condition: Secondary | ICD-10-CM

## 2011-12-03 DIAGNOSIS — L739 Follicular disorder, unspecified: Secondary | ICD-10-CM

## 2011-12-03 DIAGNOSIS — R251 Tremor, unspecified: Secondary | ICD-10-CM

## 2011-12-03 DIAGNOSIS — Z79899 Other long term (current) drug therapy: Secondary | ICD-10-CM

## 2011-12-03 DIAGNOSIS — R413 Other amnesia: Secondary | ICD-10-CM

## 2011-12-03 DIAGNOSIS — M79609 Pain in unspecified limb: Secondary | ICD-10-CM

## 2011-12-03 DIAGNOSIS — R892 Abnormal level of other drugs, medicaments and biological substances in specimens from other organs, systems and tissues: Secondary | ICD-10-CM

## 2011-12-03 DIAGNOSIS — I5022 Chronic systolic (congestive) heart failure: Secondary | ICD-10-CM

## 2011-12-03 DIAGNOSIS — N183 Chronic kidney disease, stage 3 unspecified: Secondary | ICD-10-CM

## 2011-12-03 DIAGNOSIS — E1165 Type 2 diabetes mellitus with hyperglycemia: Secondary | ICD-10-CM

## 2011-12-03 DIAGNOSIS — IMO0002 Reserved for concepts with insufficient information to code with codable children: Secondary | ICD-10-CM

## 2011-12-03 DIAGNOSIS — M545 Low back pain, unspecified: Secondary | ICD-10-CM

## 2011-12-03 DIAGNOSIS — R5381 Other malaise: Secondary | ICD-10-CM | POA: Insufficient documentation

## 2011-12-03 DIAGNOSIS — N179 Acute kidney failure, unspecified: Secondary | ICD-10-CM

## 2011-12-03 DIAGNOSIS — I10 Essential (primary) hypertension: Secondary | ICD-10-CM

## 2011-12-03 DIAGNOSIS — E1149 Type 2 diabetes mellitus with other diabetic neurological complication: Secondary | ICD-10-CM | POA: Insufficient documentation

## 2011-12-03 DIAGNOSIS — K219 Gastro-esophageal reflux disease without esophagitis: Secondary | ICD-10-CM

## 2011-12-03 DIAGNOSIS — G609 Hereditary and idiopathic neuropathy, unspecified: Secondary | ICD-10-CM

## 2011-12-03 DIAGNOSIS — I428 Other cardiomyopathies: Secondary | ICD-10-CM

## 2011-12-03 DIAGNOSIS — F3289 Other specified depressive episodes: Secondary | ICD-10-CM

## 2011-12-03 DIAGNOSIS — R0902 Hypoxemia: Secondary | ICD-10-CM | POA: Insufficient documentation

## 2011-12-03 DIAGNOSIS — M549 Dorsalgia, unspecified: Secondary | ICD-10-CM | POA: Insufficient documentation

## 2011-12-03 DIAGNOSIS — R209 Unspecified disturbances of skin sensation: Secondary | ICD-10-CM

## 2011-12-03 DIAGNOSIS — M720 Palmar fascial fibromatosis [Dupuytren]: Secondary | ICD-10-CM

## 2011-12-03 DIAGNOSIS — G4700 Insomnia, unspecified: Secondary | ICD-10-CM

## 2011-12-03 DIAGNOSIS — M542 Cervicalgia: Secondary | ICD-10-CM

## 2011-12-03 DIAGNOSIS — IMO0001 Reserved for inherently not codable concepts without codable children: Secondary | ICD-10-CM

## 2011-12-03 DIAGNOSIS — G4730 Sleep apnea, unspecified: Secondary | ICD-10-CM | POA: Insufficient documentation

## 2011-12-03 DIAGNOSIS — R634 Abnormal weight loss: Secondary | ICD-10-CM

## 2011-12-03 DIAGNOSIS — E1142 Type 2 diabetes mellitus with diabetic polyneuropathy: Secondary | ICD-10-CM

## 2011-12-03 DIAGNOSIS — E785 Hyperlipidemia, unspecified: Secondary | ICD-10-CM

## 2011-12-03 DIAGNOSIS — G4733 Obstructive sleep apnea (adult) (pediatric): Secondary | ICD-10-CM

## 2011-12-03 DIAGNOSIS — I517 Cardiomegaly: Secondary | ICD-10-CM | POA: Insufficient documentation

## 2011-12-03 LAB — URINALYSIS, ROUTINE W REFLEX MICROSCOPIC
Bilirubin Urine: NEGATIVE
Ketones, ur: NEGATIVE mg/dL
Nitrite: NEGATIVE
Urobilinogen, UA: 0.2 mg/dL (ref 0.0–1.0)

## 2011-12-03 LAB — BASIC METABOLIC PANEL
CO2: 25 mEq/L (ref 19–32)
Chloride: 104 mEq/L (ref 96–112)
Creatinine, Ser: 1.69 mg/dL — ABNORMAL HIGH (ref 0.50–1.35)
Glucose, Bld: 188 mg/dL — ABNORMAL HIGH (ref 70–99)
Sodium: 138 mEq/L (ref 135–145)

## 2011-12-03 LAB — HEPATIC FUNCTION PANEL
ALT: 12 U/L (ref 0–53)
Alkaline Phosphatase: 66 U/L (ref 39–117)
Bilirubin, Direct: <0.1 mg/dL (ref 0.0–0.3)
Total Bilirubin: 0.2 mg/dL — ABNORMAL LOW (ref 0.3–1.2)

## 2011-12-03 LAB — BLOOD GAS, ARTERIAL
Acid-base deficit: 0.3 mmol/L (ref 0.0–2.0)
Bicarbonate: 23.8 mEq/L (ref 20.0–24.0)
O2 Saturation: 98.6 %
pO2, Arterial: 136 mmHg — ABNORMAL HIGH (ref 80.0–100.0)

## 2011-12-03 LAB — RAPID URINE DRUG SCREEN, HOSP PERFORMED
Benzodiazepines: NOT DETECTED
Opiates: NOT DETECTED

## 2011-12-03 LAB — CBC WITH DIFFERENTIAL/PLATELET
Eosinophils Relative: 2 % (ref 0–5)
HCT: 38.5 % — ABNORMAL LOW (ref 39.0–52.0)
Lymphocytes Relative: 22 % (ref 12–46)
Lymphs Abs: 1.6 10*3/uL (ref 0.7–4.0)
MCV: 85.6 fL (ref 78.0–100.0)
Monocytes Absolute: 0.4 10*3/uL (ref 0.1–1.0)
RBC: 4.5 MIL/uL (ref 4.22–5.81)
WBC: 7.5 10*3/uL (ref 4.0–10.5)

## 2011-12-03 LAB — ACETAMINOPHEN LEVEL: Acetaminophen (Tylenol), Serum: <15 ug/mL (ref 10–30)

## 2011-12-03 MED ORDER — NALOXONE HCL 0.4 MG/ML IJ SOLN
0.4000 mg | Freq: Once | INTRAMUSCULAR | Status: AC
  Administered 2011-12-03: 0.4 mg via INTRAVENOUS
  Filled 2011-12-03: qty 1

## 2011-12-03 MED ORDER — SODIUM CHLORIDE 0.9 % IV SOLN
Freq: Once | INTRAVENOUS | Status: AC
  Administered 2011-12-03: 17:00:00 via INTRAVENOUS

## 2011-12-03 NOTE — ED Notes (Signed)
Patient transported to X-ray 

## 2011-12-03 NOTE — ED Notes (Signed)
Contact Daughter with questions [phone].

## 2011-12-03 NOTE — ED Notes (Signed)
Pt unable to give urine sample at this time 

## 2011-12-03 NOTE — ED Provider Notes (Signed)
History     CSN: [id number]  Arrival date & time 12/03/11  1611   First MD Initiated Contact with Patient 12/03/11 1625      Chief Complaint  Patient presents with  . Weakness  . Back Pain  . Neck Pain    (Consider location/radiation/quality/duration/timing/severity/associated sxs/prior treatment) HPI Comments: LEVEL 5 CAVEAT FOR ALTERED MENTAL STATUS. Pt with hx of CHF, DM, HTN, CKD, CAD, COPD. Pt comes in with cc of AMS. Pt unable to give any hx as he is too sleepy (GCS - 2, 2, 4 (e/v/m) 8), but protecting airway. Pt's wife states that patient has been getting weaker over the past week. Today they went to see his pain specialist, and out in the parking lot, patient just got too sleepy, and EMS was called.  Pt take oxy 30 mg q 6 hours, but the last dose was yday. No hx of similar complains in the past. Pt does have hx of of "GI problems" but no known hx of cancer. Pt has had about 100 lb weight loss over the past 1 year.  Patient is a 56 y.o. male presenting with weakness, back pain, and neck pain. The history is provided by the patient, the spouse and medical records.  Weakness Primary symptoms do not include fever.  Additional symptoms include weakness.  Back Pain  Associated symptoms include weakness. Pertinent negatives include no fever.  Neck Pain  Associated symptoms include weakness.    Past Medical History  Diagnosis Date  . Diabetes mellitus   . Hypertension   . CHF (congestive heart failure)   . CRD (chronic renal disease)   . Arthritis   . Obesity   . Depression   . Sleep apnea   . Diabetic neuropathy   . CAD (coronary artery disease)   . Mitral regurgitation   . Esophagitis, reflux 07/07/10    MW tear, erosive reflux esophagitis, 2cm hh  . S/P colonoscopy April 2012    Rourk: torturous colon, hyperplastic polyps  . Collagen vascular disease     "myoclonic "  . Tremor   . Diverticulum   . Hiatal hernia   . GERD (gastroesophageal reflux disease)    erosive    Past Surgical History  Procedure Date  . Neck surgery     discectomy  . Hand surgery     bilateral  . Colonoscopy before 2002    [place]  . Appendectomy   . Other surgical history     bone graft-knee  . Anterior lumbar corpectomy w/ fusion     C5-6/C4-7  . Colonoscopy 08/21/2010    elongaed tortuous colon otherwise normal  . Esophagogastroduodenoscopy 07/08/10    Dr. Marny Lowenstein esophageal erosions and excoriations consistent with erosive reflux esophagitis, hiatal hernia    Family History  Problem Relation Age of Onset  . Stroke Father 55  . Stroke Mother 71  . Colon cancer Neg Hx   . Crohn's disease Neg Hx   . Cirrhosis Neg Hx   . Ulcerative colitis Neg Hx   . Stomach cancer Neg Hx     History  Substance Use Topics  . Smoking status: Current Everyday Smoker -- 0.2 packs/day for 40 years    Types: Cigarettes  . Smokeless tobacco: Never Used   Comment: ordered nicotine patches. start date is 27 July 2010  . Alcohol Use: No      Review of Systems  Constitutional: Positive for activity change, fatigue and unexpected weight change. Negative for fever and diaphoresis.  Respiratory: Positive for shortness of breath.   Gastrointestinal: Positive for constipation.  Musculoskeletal: Positive for back pain.  Neurological: Positive for weakness. Negative for syncope.    Allergies  Review of patient's allergies indicates no known allergies.  Home Medications   Current Outpatient Rx  Name Route Sig Dispense Refill  . CLONAZEPAM 0.5 MG PO TABS Oral Take 0.5 mg by mouth 2 (two) times daily.     Marland Kitchen DOCUSATE SODIUM 100 MG PO CAPS Oral Take 100-200 mg by mouth 2 (two) times daily. Take 2 tablets every morning and 1 tablet at bedtime    . FLUOXETINE HCL 40 MG PO CAPS Oral Take 40 mg by mouth daily.     . FUROSEMIDE 40 MG PO TABS Oral Take 40 mg by mouth 2 (two) times daily as needed. Fluid retention    . GABAPENTIN 400 MG PO CAPS Oral Take 400 mg by mouth 3  (three) times daily.    . INSULIN ISOPHANE & REGULAR (70-30) 100 UNIT/ML Geraldine SUSP Subcutaneous Inject 50 Units into the skin 2 (two) times daily.      . CENTRUM SILVER ULTRA MENS PO TABS Oral Take 1 tablet by mouth daily.      Marland Kitchen ONDANSETRON HCL 4 MG PO TABS Oral Take 4 mg by mouth 4 (four) times daily as needed. For nausea    . OXYCODONE HCL 30 MG PO TABS Oral Take 30 mg by mouth every 6 (six) hours as needed.    Marland Kitchen SIMVASTATIN 40 MG PO TABS Oral Take 20 mg by mouth at bedtime.     . SPIRONOLACTONE 25 MG PO TABS Oral Take 25 mg by mouth at bedtime.     . TRAZODONE HCL 100 MG PO TABS Oral Take 50 mg by mouth at bedtime.      BP 149/83  Pulse 68  Temp 99 F (37.2 C) (Oral)  Resp 17  SpO2 98%  Physical Exam  Nursing note and vitals reviewed. Constitutional: He is oriented to person, place, and time. He appears well-developed.  HENT:  Head: Normocephalic and atraumatic.       Pupils are 1 mm and equal  Eyes: Conjunctivae and EOM are normal. Pupils are equal, round, and reactive to light.  Neck: Normal range of motion. Neck supple.  Cardiovascular: Normal rate and regular rhythm.   No murmur heard. Pulmonary/Chest: Effort normal and breath sounds normal. No respiratory distress. He has no wheezes.       anterior ausculatation  Abdominal: Soft. Bowel sounds are normal. He exhibits no distension and no mass. There is no tenderness. There is no rebound and no guarding.  Neurological: He is alert and oriented to person, place, and time.       GCS 8. Pt not holding bilateral upper or lower extremity to gravity. Weak, but no focal weakness noted.  Skin: Skin is warm. No rash noted. He is not diaphoretic.    ED Course  Procedures (including critical care time)  Labs Reviewed  GLUCOSE, CAPILLARY - Abnormal; Notable for the following:    Glucose-Capillary 180 (*)     All other components within normal limits  URINE RAPID DRUG SCREEN (HOSP PERFORMED)  ACETAMINOPHEN LEVEL  BASIC METABOLIC  PANEL  CBC WITH DIFFERENTIAL  HEPATIC FUNCTION PANEL  URINALYSIS, ROUTINE W REFLEX MICROSCOPIC  SALICYLATE LEVEL   No results found.   No diagnosis found.    MDM   Date: 12/03/2011  Rate: 71  Rhythm: normal sinus rhythm  QRS Axis:  normal  Intervals: normal  ST/T Wave abnormalities: normal  Conduction Disutrbances: none  Narrative Interpretation: unremarkable, unchanged  DDx includes: ICH Stroke ACS Sepsis syndrome Infection - UTI/Pneumonia Electrolyte abnormality Drug overdose DKA Metabolic disorders including thyroid disorders, adrenal insufficiency Acute anemia Cancer of unknown origin Hypercapnia COPD Hypoxia   Pt with significant medical hx comes in with cc of weakness/AMS. Pt noted to be lethargic. Vitals are stable, and no fevers noted. Pt's exam is grossly unremarkable besides AMS and low GCS and poor strength. DDx is broad, and we will initiate a broad workup.  Pt is protecting his airway.  Will check CBG and give narcan, and see if there is any response.             Derwood Kaplan, MD 12/03/11 1706

## 2011-12-03 NOTE — ED Notes (Signed)
Pt aware that urine sample is needed. Pt given urinal and is attempting to give sample at this time

## 2011-12-03 NOTE — ED Notes (Signed)
PER EMS- was called out with c/o diabetic issues. CBG 149.  Pt was picked up from pain management clinic.  Pt was lethargic, alert and oriented x4.  Hx of chronic pain, htn, anxiety, depression, type 1 DM.

## 2011-12-04 DIAGNOSIS — F411 Generalized anxiety disorder: Secondary | ICD-10-CM

## 2011-12-04 DIAGNOSIS — D649 Anemia, unspecified: Secondary | ICD-10-CM

## 2011-12-04 DIAGNOSIS — N179 Acute kidney failure, unspecified: Secondary | ICD-10-CM

## 2011-12-04 LAB — COMPREHENSIVE METABOLIC PANEL
AST: 11 U/L (ref 0–37)
Albumin: 3.1 g/dL — ABNORMAL LOW (ref 3.5–5.2)
Calcium: 8.8 mg/dL (ref 8.4–10.5)
Chloride: 109 mEq/L (ref 96–112)
Creatinine, Ser: 1.54 mg/dL — ABNORMAL HIGH (ref 0.50–1.35)
Total Bilirubin: 0.3 mg/dL (ref 0.3–1.2)
Total Protein: 5.8 g/dL — ABNORMAL LOW (ref 6.0–8.3)

## 2011-12-04 LAB — CBC
HCT: 37.5 % — ABNORMAL LOW (ref 39.0–52.0)
Hemoglobin: 12.3 g/dL — ABNORMAL LOW (ref 13.0–17.0)
MCH: 27.9 pg (ref 26.0–34.0)
MCV: 85 fL (ref 78.0–100.0)
RBC: 4.41 MIL/uL (ref 4.22–5.81)

## 2011-12-04 LAB — HEMOGLOBIN A1C
Hgb A1c MFr Bld: 7.4 % — ABNORMAL HIGH (ref <5.7)
Mean Plasma Glucose: 166 mg/dL — ABNORMAL HIGH (ref <117)

## 2011-12-04 LAB — GLUCOSE, CAPILLARY
Glucose-Capillary: 171 mg/dL — ABNORMAL HIGH (ref 70–99)
Glucose-Capillary: 180 mg/dL — ABNORMAL HIGH (ref 70–99)
Glucose-Capillary: 149 mg/dL — ABNORMAL HIGH (ref 70–99)

## 2011-12-04 MED ORDER — INSULIN ASPART PROT & ASPART (70-30 MIX) 100 UNIT/ML ~~LOC~~ SUSP
50.0000 [IU] | Freq: Two times a day (BID) | SUBCUTANEOUS | Status: DC
  Administered 2011-12-04: 50 [IU] via SUBCUTANEOUS
  Filled 2011-12-04: qty 3

## 2011-12-04 MED ORDER — FUROSEMIDE 40 MG PO TABS
40.0000 mg | ORAL_TABLET | Freq: Two times a day (BID) | ORAL | Status: DC
  Filled 2011-12-04 (×2): qty 1

## 2011-12-04 MED ORDER — INSULIN ASPART 100 UNIT/ML ~~LOC~~ SOLN
0.0000 [IU] | Freq: Three times a day (TID) | SUBCUTANEOUS | Status: DC
  Administered 2011-12-04: 2 [IU] via SUBCUTANEOUS
  Administered 2011-12-04: 1 [IU] via SUBCUTANEOUS
  Administered 2011-12-04: 2 [IU] via SUBCUTANEOUS

## 2011-12-04 MED ORDER — OXYCODONE HCL 5 MG PO TABS
30.0000 mg | ORAL_TABLET | Freq: Four times a day (QID) | ORAL | Status: DC | PRN
  Administered 2011-12-04 – 2011-12-05 (×4): 30 mg via ORAL
  Filled 2011-12-04 (×2): qty 6
  Filled 2011-12-04: qty 1
  Filled 2011-12-04 (×2): qty 6

## 2011-12-04 MED ORDER — INSULIN NPH ISOPHANE & REGULAR (70-30) 100 UNIT/ML ~~LOC~~ SUSP: 50.0000 [IU] | Freq: Two times a day (BID) | SUBCUTANEOUS | Status: DC

## 2011-12-04 MED ORDER — ACETAMINOPHEN 325 MG PO TABS
650.0000 mg | ORAL_TABLET | Freq: Four times a day (QID) | ORAL | Status: DC | PRN
  Administered 2011-12-04 – 2011-12-05 (×4): 650 mg via ORAL
  Filled 2011-12-04 (×4): qty 2

## 2011-12-04 MED ORDER — CLONAZEPAM 0.5 MG PO TABS
0.5000 mg | ORAL_TABLET | Freq: Two times a day (BID) | ORAL | Status: DC
  Administered 2011-12-04 – 2011-12-05 (×3): 0.5 mg via ORAL
  Filled 2011-12-04 (×3): qty 1

## 2011-12-04 MED ORDER — ONDANSETRON HCL 4 MG PO TABS: 4.0000 mg | ORAL_TABLET | Freq: Four times a day (QID) | ORAL | Status: DC | PRN

## 2011-12-04 MED ORDER — FLUOXETINE HCL 20 MG PO CAPS
40.0000 mg | ORAL_CAPSULE | Freq: Every day | ORAL | Status: DC
  Administered 2011-12-04 – 2011-12-05 (×2): 40 mg via ORAL
  Filled 2011-12-04 (×2): qty 2

## 2011-12-04 MED ORDER — FUROSEMIDE 40 MG PO TABS
40.0000 mg | ORAL_TABLET | Freq: Two times a day (BID) | ORAL | Status: DC
  Filled 2011-12-04 (×4): qty 1

## 2011-12-04 MED ORDER — ACETAMINOPHEN 650 MG RE SUPP: 650.0000 mg | Freq: Four times a day (QID) | RECTAL | Status: DC | PRN

## 2011-12-04 MED ORDER — ONDANSETRON HCL 4 MG/2ML IJ SOLN: 4.0000 mg | Freq: Four times a day (QID) | INTRAMUSCULAR | Status: DC | PRN

## 2011-12-04 MED ORDER — SODIUM CHLORIDE 0.9 % IV SOLN
INTRAVENOUS | Status: DC
  Administered 2011-12-04 (×2): via INTRAVENOUS

## 2011-12-04 MED ORDER — GLUCERNA SHAKE PO LIQD
237.0000 mL | Freq: Three times a day (TID) | ORAL | Status: DC
  Administered 2011-12-04 (×2): 237 mL via ORAL
  Filled 2011-12-04 (×5): qty 237

## 2011-12-04 MED ORDER — SPIRONOLACTONE 25 MG PO TABS
25.0000 mg | ORAL_TABLET | Freq: Every day | ORAL | Status: DC
  Administered 2011-12-04: 25 mg via ORAL
  Filled 2011-12-04 (×2): qty 1

## 2011-12-04 NOTE — Progress Notes (Signed)
INITIAL ADULT NUTRITION ASSESSMENT Date: 12/04/2011   Time: 11:28 AM Reason for Assessment: Nutrition risk   INTERVENTION: Glucerna shake BID. Will monitor.   - Pt meets criteria for severe PCM of chronic illness AEB <75% estimated energy intake for the past several months with 32% weight loss in the past 17 months   ASSESSMENT: Male 56 y.o.  Dx: Altered mental state  Food/Nutrition Related Hx: Pt reports unintended weight loss of 197 pounds since May of last year, however past records indicate a 99 pound unintended weight loss since March of last year. Pt reports this weight loss has been due to on/off vomiting that consists of food/mucous/bile. Pt reports he "burned his lips off from bile". Pt reports eating 4-5 small meals/day. Pt with history of erosive reflux esophagitis. Pt reports he was not on any nutritional supplements at home because he could not afford them. Pt reports his blood sugars were under control at home. Pt reports not eating anything today because of being in a lot of pain that was not controlled by tylenol - notified MD. Pt followed by outpatient gastroenterologist who has noted that pt has had abnormal weight loss.   Hx:  Past Medical History  Diagnosis Date  . Diabetes mellitus   . Hypertension   . CHF (congestive heart failure)   . CRD (chronic renal disease)   . Arthritis   . Obesity   . Depression   . Sleep apnea   . Diabetic neuropathy   . CAD (coronary artery disease)   . Mitral regurgitation   . Esophagitis, reflux 07/07/10    MW tear, erosive reflux esophagitis, 2cm hh  . S/P colonoscopy April 2012    Rourk: torturous colon, hyperplastic polyps  . Collagen vascular disease     "myoclonic "  . Tremor   . Diverticulum   . Hiatal hernia   . GERD (gastroesophageal reflux disease)     erosive   Related Meds:  Scheduled Meds:   . sodium chloride   Intravenous Once  . FLUoxetine  40 mg Oral Daily  . insulin aspart  0-9 Units Subcutaneous TID WC  .  naloxone (NARCAN) injection  0.4 mg Intravenous Once   Continuous Infusions:   . sodium chloride 100 mL/hr at 12/04/11 0207   PRN Meds:.acetaminophen, acetaminophen, ondansetron (ZOFRAN) IV, ondansetron  Ht: 6' (182.9 cm)  Wt: 207 lb (93.895 kg)  Ideal Wt: 178 lb % Ideal Wt: 116  Usual Wt: 306 lb in March 2012 % Usual Wt: 67  Wt Readings from Last 10 Encounters:  12/04/11 207 lb (93.895 kg)  06/04/11 234 lb 3.2 oz (106.232 kg)  03/11/11 258 lb 3.2 oz (117.119 kg)  02/15/11 262 lb 9.1 oz (119.1 kg)  01/27/11 263 lb (119.296 kg)  12/09/10 294 lb 9.6 oz (133.63 kg)  07/24/10 306 lb (138.801 kg)  12/10/08 298 lb (135.172 kg)  10/31/08 297 lb 8 oz (134.945 kg)  09/04/08 294 lb 8 oz (133.584 kg)    Body mass index is 28.07 kg/(m^2).   Labs:  CMP     Component Value Date/Time   NA 143 12/04/2011 0443   K 3.8 12/04/2011 0443   CL 109 12/04/2011 0443   CO2 25 12/04/2011 0443   GLUCOSE 150* 12/04/2011 0443   BUN 24* 12/04/2011 0443   CREATININE 1.54* 12/04/2011 0443   CREATININE 2.0 06/26/2011 1006   CALCIUM 8.8 12/04/2011 0443   PROT 5.8* 12/04/2011 0443   ALBUMIN 3.1* 12/04/2011 0443   AST  11 12/04/2011 0443   AST 12 06/26/2011 1006   ALT 10 12/04/2011 0443   ALKPHOS 70 12/04/2011 0443   ALKPHOS 78 06/26/2011 1006   BILITOT 0.3 12/04/2011 0443   GFRNONAA 48* 12/04/2011 0443   GFRAA 56* 12/04/2011 0443   Lab Results  Component Value Date   HGBA1C 7.0* 02/15/2011   CBG (last 3)   Basename 12/04/11 0815 12/03/11 1637  GLUCAP 150* 180*    Intake/Output Summary (Last 24 hours) at 12/04/11 1143 Last data filed at 12/04/11 1035  Gross per 24 hour  Intake      0 ml  Output    760 ml  Net   -760 ml   Last BM - 12/02/11   Diet Order: General   IVF:    sodium chloride Last Rate: 100 mL/hr at 12/04/11 0207    Estimated Nutritional Needs:   Kcal:2250-2450 Protein:95-115g Fluid:2.2-2.4L  NUTRITION DIAGNOSIS: -Inadequate oral intake (NI-2.1).  Status: Ongoing  RELATED TO: pain, on/off  vomiting  AS EVIDENCE BY: pt statement, significant weight loss PTA  MONITORING/EVALUATION(Goals): Pt to consume >90% of meals.   EDUCATION NEEDS: -No education needs identified at this time   Dietitian #: [phone]  DOCUMENTATION CODES Per approved criteria  -Severe malnutrition in the context of chronic illness    Marshall Cork 12/04/2011, 11:28 AM

## 2011-12-04 NOTE — ED Notes (Signed)
Attempted to call report. Gabriel Rung, RN busy. Reported phone number to Diplomatic Services operational officer. RN stated she will call back.

## 2011-12-04 NOTE — H&P (Signed)
[facility] History and Physical  MAGNUS CRESCENZO ZOX:[id number] DOB: 20-May-1955 DOA: 12/03/2011  Referring physician: Derwood Kaplan PCP: Cristie Hem, MD   Chief Complaint: Altered mental status  HPI:  Mr. Kratos tries is a 56 year old male with past medical history of diabetes mellitus, hypertension, he does have chronic neck pain for which she is taken narcotics and he has history of CHF. Patient brought to the hospital by EMS because of altered mental status. Patient's wife mentioned that for the past few days he was not feeling well, he was feeling chilly and had a subjective fever, and having generalized weakness. He went today to his pain clinic, and he was very confused and delirious, he got to the point she couldn't arouse him. 911 called and patient was brought to the hospital, has CBC was okay, his normal vital signs. There was no evidence of infection was normal in size, normal x-ray and urinalysis. Surprisingly his urine drug screen is negative with a meal and taking prescription oxycodone and clonazepam. Patient's wife mentioned that he was not taking these medications since yesterday morning, stated that he still takes his Neurontin. Patient was placed in observation in the emergency department for about 4 hours, his mentation was still not back to his baseline, so [facility] were asked to observe the patient overnight in the hospital.   Review of Systems:  Unobtainable secondary to patient sleepiness  Past Medical History  Diagnosis Date  . Diabetes mellitus   . Hypertension   . CHF (congestive heart failure)   . CRD (chronic renal disease)   . Arthritis   . Obesity   . Depression   . Sleep apnea   . Diabetic neuropathy   . CAD (coronary artery disease)   . Mitral regurgitation   . Esophagitis, reflux 07/07/10    MW tear, erosive reflux esophagitis, 2cm hh  . S/P colonoscopy April 2012    Rourk: torturous colon, hyperplastic polyps  . Collagen vascular  disease     "myoclonic "  . Tremor   . Diverticulum   . Hiatal hernia   . GERD (gastroesophageal reflux disease)     erosive   Past Surgical History  Procedure Date  . Neck surgery     discectomy  . Hand surgery     bilateral  . Colonoscopy before 2002    [place]  . Appendectomy   . Other surgical history     bone graft-knee  . Anterior lumbar corpectomy w/ fusion     C5-6/C4-7  . Colonoscopy 08/21/2010    elongaed tortuous colon otherwise normal  . Esophagogastroduodenoscopy 07/08/10    Dr. Marny Lowenstein esophageal erosions and excoriations consistent with erosive reflux esophagitis, hiatal hernia   Social History:  reports that he has been smoking Cigarettes.  He has a 20 pack-year smoking history. He has never used smokeless tobacco. He reports that he does not drink alcohol or use illicit drugs. Lives at home, with his wife. He does have chronic neck pain, but he does not use any equipment for ambulation.  No Known Allergies  Family History  Problem Relation Age of Onset  . Stroke Father 6  . Stroke Mother 36  . Colon cancer Neg Hx   . Crohn's disease Neg Hx   . Cirrhosis Neg Hx   . Ulcerative colitis Neg Hx   . Stomach cancer Neg Hx     Prior to Admission medications   Medication Sig Start Date End Date Taking? Authorizing Provider  clonazePAM Scarlette Calico)  0.5 MG tablet Take 0.5 mg by mouth 2 (two) times daily.    Yes Historical Provider, MD  docusate sodium (COLACE) 100 MG capsule Take 100-200 mg by mouth 2 (two) times daily. Take 2 tablets every morning and 1 tablet at bedtime   Yes Historical Provider, MD  FLUoxetine (PROZAC) 40 MG capsule Take 40 mg by mouth daily.  07/09/10  Yes Historical Provider, MD  furosemide (LASIX) 40 MG tablet Take 40 mg by mouth 2 (two) times daily as needed. Fluid retention 07/08/10  Yes Historical Provider, MD  gabapentin (NEURONTIN) 400 MG capsule Take 400 mg by mouth 3 (three) times daily.   Yes Historical Provider, MD  insulin  NPH-insulin regular (NOVOLIN 70/30) (70-30) 100 UNIT/ML injection Inject 50 Units into the skin 2 (two) times daily.   02/19/11  Yes Christiane Ha, MD  Multiple Vitamins-Minerals (CENTRUM SILVER ULTRA MENS) TABS Take 1 tablet by mouth daily.     Yes Historical Provider, MD  ondansetron (ZOFRAN) 4 MG tablet Take 4 mg by mouth 4 (four) times daily as needed. For nausea 07/30/11  Yes Tiffany Kocher, PA  oxycodone (ROXICODONE) 30 MG immediate release tablet Take 30 mg by mouth every 6 (six) hours as needed. 02/19/11  Yes Christiane Ha, MD  simvastatin (ZOCOR) 40 MG tablet Take 20 mg by mouth at bedtime.  07/08/10  Yes Historical Provider, MD  spironolactone (ALDACTONE) 25 MG tablet Take 25 mg by mouth at bedtime.  07/08/10  Yes Historical Provider, MD  traZODone (DESYREL) 100 MG tablet Take 50 mg by mouth at bedtime. 02/19/11  Yes Christiane Ha, MD   Physical Exam: Filed Vitals:   12/03/11 2100 12/03/11 2200 12/03/11 2346 12/03/11 2356  BP: 150/66 168/66    Pulse: 78 66 65 66  Temp:      TempSrc:      Resp:      SpO2: 98% 98% 98% 99%   General appearance: alert, cooperative and no distress  Head: Normocephalic, without obvious abnormality, atraumatic  Eyes: conjunctivae/corneas clear. PERRL, EOM's intact. Fundi benign.  Nose: Nares normal. Septum midline. Mucosa normal. No drainage or sinus tenderness.  Throat: lips, mucosa, and tongue normal; teeth and gums normal  Neck: Supple, no masses, no cervical lymphadenopathy, no JVD appreciated, no meningeal signs Resp: clear to auscultation bilaterally  Chest wall: no tenderness  Cardio: regular rate and rhythm, S1, S2 normal, no murmur, click, rub or gallop  GI: soft, non-tender; bowel sounds normal; no masses, no organomegaly  Extremities: extremities normal, atraumatic, no cyanosis or edema  Skin: Skin color, texture, turgor normal. No rashes or lesions  Neurologic: Sleepy, easy to arouse, he is oriented x2, only not to  time.  Labs on Admission:  Basic Metabolic Panel:  Lab 12/03/11 8657  NA 138  K 4.8  CL 104  CO2 25  GLUCOSE 188*  BUN 27*  CREATININE 1.69*  CALCIUM 8.9  MG --  PHOS --   Liver Function Tests:  Lab 12/03/11 1720  AST 25  ALT 12  ALKPHOS 66  BILITOT 0.2*  PROT 6.3  ALBUMIN 3.4*   No results found for this basename: LIPASE:5,AMYLASE:5 in the last 168 hours No results found for this basename: AMMONIA:5 in the last 168 hours CBC:  Lab 12/03/11 1720  WBC 7.5  NEUTROABS 5.2  HGB 12.7*  HCT 38.5*  MCV 85.6  PLT 225   Cardiac Enzymes: No results found for this basename: CKTOTAL:5,CKMB:5,CKMBINDEX:5,TROPONINI:5 in the last 168 hours  BNP (last 3 results) No results found for this basename: PROBNP:3 in the last 8760 hours CBG:  Lab 12/03/11 1637  GLUCAP 180*    Radiological Exams on Admission: Ct Head Wo Contrast  12/03/2011  *RADIOLOGY REPORT*  Clinical Data: Weakness.  Altered mental status.  CT HEAD WITHOUT CONTRAST  Technique:  Contiguous axial images were obtained from the base of the skull through the vertex without contrast.  Comparison: MRI of the brain 02/16/2011.  Head CT 02/15/2011.  Findings: No acute intracranial abnormality.  Specifically, no definite signs of acute/subacute cerebral ischemia, no definite acute intracranial hemorrhage, no focal mass, mass effect, hydrocephalus or abnormal intra or extra-axial fluid collections. Visualized paranasal sinuses and mastoids are well pneumatized.  No acute displaced skull fractures are identified.  IMPRESSION: 1.  No acute intracranial abnormalities. 2.  The appearance of the brain is normal.  Original Report Authenticated By: Florencia Reasons, M.D.   Dg Abd Acute W/chest  12/03/2011  *RADIOLOGY REPORT*  Clinical Data: Weakness, back pain, hypoxia  ACUTE ABDOMEN SERIES (ABDOMEN 2 VIEW & CHEST 1 VIEW)  Comparison: 07/07/2010, 02/15/2011  Findings: Mild cardiomegaly without CHF or pneumonia.  No effusion or  pneumothorax.  No focal airspace disease.  Lower cervical fusion hardware evident.  No free air evident on the decubitus view.  Scattered air and stool throughout the bowel.  Negative for obstruction or ileus. Degenerative changes of the spine.  No abnormal calcifications.  IMPRESSION: No acute finding.  Negative for obstruction  Original Report Authenticated By: Judie Petit. Ruel Favors, M.D.    Assessment/Plan Principal Problem:  *Altered mental state Active Problems:  DIABETES MELLITUS, TYPE II, UNCONTROLLED, WITH COMPLICATIONS  NECK PAIN, CHRONIC  CKD (chronic kidney disease), stage III   Altered mental status His altered mental status is likely secondary to medications. Even with his UDS is negative for narcotics and benzodiazepines, patient is still taking Neurontin and trazodone. As mentioned above there is no other reasons of acute delirium, including no infections. Patient already improving, and his mentation is much better he is arousable now, he can protect his airways without any problems. I expect by morning he'll be back to his baseline.  Diabetes mellitus 2 uncontrolled Patient is on 70/30 insulin mix, because of uncertainty of his oral intake I will hold that, we'll place patient on insulin sliding scale, l will obtain hemoglobin A1c.  Chronic neck pain/chronic narcotics use As mentioned above patient supposed to be on 30 mg of OxyIR every 6 hours as needed for pain, his UDS tested negative for any narcotics. He is sleepy right now and not ask about pain medications, this is being held because of the potential they can cause altered mental status. But if patient starts to hurt we will start his home dose of narcotics.  CKD stage III Patient at his baseline with creatinine of 1.6.  Code Status: Full code Family Communication: His wife Arline Asp was at bedside and plan was explained. Disposition Plan: Telemetry bed for observation overnight, expect discharge to home in the morning.  Time  spent: 60 minutes  [facility] Pager [phone]  If 7PM-7AM, please contact night-coverage [email or web address] Password [email or web address] 12/04/2011, 12:07 AM

## 2011-12-04 NOTE — Progress Notes (Signed)
1:55 PM I agree with HPI/GPe and A/P per Dr. Arthor Captain      HEENT-alert, and slighlty somnolent CHEST CARDIAC ABDOMEN NEURO SKIN/MUSCULAR  Patient Active Problem List  Diagnosis  . DIABETES MELLITUS, TYPE II, UNCONTROLLED, WITH COMPLICATIONS  . HYPERLIPIDEMIA  . MORBID OBESITY  . UNSPECIFIED ANEMIA  . ANXIETY  . ERECTILE DYSFUNCTION  . INSOMNIA, CHRONIC  . DEPRESSION  . SLEEP APNEA, OBSTRUCTIVE  . RESTLESS LEG SYNDROME  . CARPAL TUNNEL SYNDROME, BILATERAL  . PERIPHERAL NEUROPATHY  . HYPERTENSION  . MYOCARDIAL INFARCTION, HX OF  . CORONARY ARTERY DISEASE  . CARDIOMYOPATHY  . CONGESTIVE HEART FAILURE  . GERD  . RENAL INSUFFICIENCY, ACUTE  . ARTHRITIS  . ANKLE PAIN, BILATERAL  . NECK PAIN, CHRONIC  . LOW BACK PAIN  . DUPUYTREN'S CONTRACTURE, BILATERAL  . UNSPECIFIED MYALGIA AND MYOSITIS  . FOOT PAIN, BILATERAL  . MEMORY LOSS  . NUMBNESS, HAND  . Folliculitis of perineum  . Nausea and vomiting  . Delirium  . Tremor  . ARF (acute renal failure)  . CKD (chronic kidney disease), stage III  . Leukocytosis  . OSA on CPAP  . Chronic systolic heart failure  . HTN (hypertension)  . DM type 2 with diabetic peripheral neuropathy  . Polypharmacy  . Anemia  . UTI (lower urinary tract infection)  . Drug toxicity  . Weight loss, abnormal  . Constipation  . Altered mental state   Multiple causes for AMS-NO specific precipitant.  Likely he withdrew from his opiates.  Will need to re-institute, close Out-pt f/u With PCP and reassess. Potenttial d/c late in the day today if all stable and patient more alert and oriented  Pleas Koch, MD [facility] [phone]

## 2011-12-04 NOTE — ED Notes (Addendum)
Talked with Dr. Arthor Captain. Pt has to be tele, but may go off cardiac monitoring for transport. Verbal readback confirmed.

## 2011-12-05 DIAGNOSIS — M129 Arthropathy, unspecified: Secondary | ICD-10-CM

## 2011-12-05 DIAGNOSIS — M25579 Pain in unspecified ankle and joints of unspecified foot: Secondary | ICD-10-CM

## 2011-12-05 MED ORDER — INSULIN NPH ISOPHANE & REGULAR (70-30) 100 UNIT/ML ~~LOC~~ SUSP: 15.0000 [IU] | Freq: Two times a day (BID) | SUBCUTANEOUS | Status: DC

## 2011-12-05 MED ORDER — FUROSEMIDE 40 MG PO TABS: 20.0000 mg | ORAL_TABLET | ORAL | Status: DC | PRN

## 2011-12-05 MED ORDER — FLUOXETINE HCL 40 MG PO CAPS: ORAL_CAPSULE | ORAL | Status: DC

## 2011-12-05 MED ORDER — GABAPENTIN 400 MG PO CAPS
400.0000 mg | ORAL_CAPSULE | Freq: Three times a day (TID) | ORAL | Status: DC
  Administered 2011-12-05: 400 mg via ORAL
  Filled 2011-12-05 (×3): qty 1

## 2011-12-05 MED ORDER — CLONAZEPAM 0.5 MG PO TABS: ORAL_TABLET | ORAL | Status: DC

## 2011-12-05 NOTE — Discharge Summary (Signed)
Physician Discharge Summary  [name] ZOX:[id number] DOB: Dec 23, 1955 DOA: 12/03/2011  PCP: Cristie Hem, MD  Admit date: 12/03/2011 Discharge date: 12/05/2011  Recommendations for Outpatient Follow-up:  1. Simplify the medications   Discharge Diagnoses:  Principal Problem:  *Altered mental state Active Problems:  DIABETES MELLITUS, TYPE II, UNCONTROLLED, WITH COMPLICATIONS  NECK PAIN, CHRONIC  CKD (chronic kidney disease), stage III   Discharge Condition: stable  Diet recommendation: Regular   Filed Weights   12/04/11 0645  Weight: 93.895 kg (207 lb)    History of present illness:   [name] is a 56 year old male with past medical history of diabetes mellitus, hypertension, he does have chronic neck pain for which she is taken narcotics and he has history of CHF. Patient brought to the hospital by EMS because of altered mental status. Patient's wife mentioned that for the past few days he was not feeling well, he was feeling chilly and had a subjective fever, and having generalized weakness. He went today to his pain clinic, and he was very confused and delirious, he got to the point she couldn't arouse him. 911 called and patient was brought to the hospital, has CBC was okay, his normal vital signs. There was no evidence of infection, normal x-ray and urinalysis. Surprisingly his urine drug screen is negative with a meal and taking prescription oxycodone and clonazepam. Patient's wife mentioned that he was not taking these medications since yesterday morning, stated that he still takes his Neurontin. Patient was placed in observation in the emergency department for about 4 hours, his mentation was still not back to his baseline, so [facility] admitted the patient.  Patient was more oriented and awake by the end of the day of admission.  I discussed his medications with him in detail.  He seems to have some mild confusion with regards to his home medications given that he also  takes care of his wife and takes care of her medications.  I have gone 3 to every medication personally with him on day of discharge and simplified as much as possible what I thought would be the most appropriate ones for him.  He will need close outpatient followup with his primary care physician to further reduce polypharmacy.  He also has significant chronic pain and would benefit from a pain specialist seeing him for this  Procedures:  Acute abdominal series 12/03/2011 negative  CT head without contrast 12/03/2011 negative  Consultations:  None  Discharge Exam: Filed Vitals:   12/05/11 0700  BP: 127/67  Pulse: 46  Temp: 98.6 F (37 C)  Resp: 18   Filed Vitals:   12/04/11 1344 12/04/11 1407 12/04/11 2220 12/05/11 0700  BP: 163/82 182/72 173/75 127/67  Pulse: 61 63 45 46  Temp: 97.9 F (36.6 C) 98 F (36.7 C) 98.6 F (37 C) 98.6 F (37 C)  TempSrc: Oral Oral Oral Oral  Resp: 18 18  18   Height:      Weight:      SpO2: 99% 97% 97% 97%   Alert pleasant male with flat affect No pallor no icterus Chest clinically clear S1-S2 no murmur rub or gallop Abdomen soft nontender nondistended No asterixis motor normal grossly  Discharge Instructions  Discharge Orders    Future Orders Please Complete By Expires   Diet - low sodium heart healthy      Increase activity slowly      Call MD for:  persistant nausea and vomiting      Call MD  for:  severe uncontrolled pain      Call MD for:      Call MD for:  temperature >100.4      Call MD for:  redness, tenderness, or signs of infection (pain, swelling, redness, odor or green/yellow discharge around incision site)      Call MD for:  persistant dizziness or light-headedness      Home Health      Questions: Responses:   To provide the following care/treatments RN   Face-to-face encounter      Comments:   Kelle Darting certify that this patient is under my care and that I, or a nurse practitioner or physician's  assistant working with me, had a face-to-face encounter that meets the physician face-to-face encounter requirements with this patient on 12/05/2011.   Questions: Responses:   The encounter with the patient was in whole, or in part, for the following medical condition, which is the primary reason for home health care confusion and polypharmacy   I certify that, based on my findings, the following services are medically necessary home health services Nursing   My clinical findings support the need for the above services High Risk for rehospitalization   Further, I certify that my clinical findings support that this patient is homebound due to: Mental confusion   To provide the following care/treatments RN     Medication List  As of 12/05/2011 12:05 PM   STOP taking these medications         ondansetron 4 MG tablet      simvastatin 40 MG tablet         TAKE these medications         CENTRUM SILVER ULTRA MENS Tabs   Take 1 tablet by mouth daily.      clonazePAM 0.5 MG tablet   Commonly known as: KLONOPIN   2 at night, 1 in the morning      docusate sodium 100 MG capsule   Commonly known as: COLACE   Take 100-200 mg by mouth 2 (two) times daily. Take 2 tablets every morning and 1 tablet at bedtime      FLUoxetine 40 MG capsule   Commonly known as: PROZAC   Take 60 mg daily am      furosemide 40 MG tablet   Commonly known as: LASIX   Take 0.5 tablets (20 mg total) by mouth as needed. Fluid retention      gabapentin 400 MG capsule   Commonly known as: NEURONTIN   Take 400 mg by mouth 3 (three) times daily.      insulin NPH-insulin regular (70-30) 100 UNIT/ML injection   Commonly known as: NOVOLIN 70/30   Inject 15 Units into the skin 2 (two) times daily with a meal.      oxycodone 30 MG immediate release tablet   Commonly known as: ROXICODONE   Take 30 mg by mouth every 6 (six) hours as needed.      spironolactone 25 MG tablet   Commonly known as: ALDACTONE   Take 25 mg  by mouth at bedtime.      traZODone 100 MG tablet   Commonly known as: DESYREL   Take 50 mg by mouth at bedtime.              The results of significant diagnostics from this hospitalization (including imaging, microbiology, ancillary and laboratory) are listed below for reference.    Significant Diagnostic Studies: Ct Head Wo Contrast  12/03/2011  *  RADIOLOGY REPORT*  Clinical Data: Weakness.  Altered mental status.  CT HEAD WITHOUT CONTRAST  Technique:  Contiguous axial images were obtained from the base of the skull through the vertex without contrast.  Comparison: MRI of the brain 02/16/2011.  Head CT 02/15/2011.  Findings: No acute intracranial abnormality.  Specifically, no definite signs of acute/subacute cerebral ischemia, no definite acute intracranial hemorrhage, no focal mass, mass effect, hydrocephalus or abnormal intra or extra-axial fluid collections. Visualized paranasal sinuses and mastoids are well pneumatized.  No acute displaced skull fractures are identified.  IMPRESSION: 1.  No acute intracranial abnormalities. 2.  The appearance of the brain is normal.  Original Report Authenticated By: Florencia Reasons, M.D.   Dg Abd Acute W/chest  12/03/2011  *RADIOLOGY REPORT*  Clinical Data: Weakness, back pain, hypoxia  ACUTE ABDOMEN SERIES (ABDOMEN 2 VIEW & CHEST 1 VIEW)  Comparison: 07/07/2010, 02/15/2011  Findings: Mild cardiomegaly without CHF or pneumonia.  No effusion or pneumothorax.  No focal airspace disease.  Lower cervical fusion hardware evident.  No free air evident on the decubitus view.  Scattered air and stool throughout the bowel.  Negative for obstruction or ileus. Degenerative changes of the spine.  No abnormal calcifications.  IMPRESSION: No acute finding.  Negative for obstruction  Original Report Authenticated By: Judie Petit. Ruel Favors, M.D.    Microbiology: No results found for this or any previous visit (from the past 240 hour(s)).   Labs: Basic Metabolic  Panel:  Lab 12/04/11 0443 12/03/11 1720  NA 143 138  K 3.8 4.8  CL 109 104  CO2 25 25  GLUCOSE 150* 188*  BUN 24* 27*  CREATININE 1.54* 1.69*  CALCIUM 8.8 8.9  MG -- --  PHOS -- --   Liver Function Tests:  Lab 12/04/11 0443 12/03/11 1720  AST 11 25  ALT 10 12  ALKPHOS 70 66  BILITOT 0.3 0.2*  PROT 5.8* 6.3  ALBUMIN 3.1* 3.4*   No results found for this basename: LIPASE:5,AMYLASE:5 in the last 168 hours No results found for this basename: AMMONIA:5 in the last 168 hours CBC:  Lab 12/04/11 0443 12/03/11 1720  WBC 9.0 7.5  NEUTROABS -- 5.2  HGB 12.3* 12.7*  HCT 37.5* 38.5*  MCV 85.0 85.6  PLT 176 225   Cardiac Enzymes: No results found for this basename: CKTOTAL:5,CKMB:5,CKMBINDEX:5,TROPONINI:5 in the last 168 hours BNP: BNP (last 3 results) No results found for this basename: PROBNP:3 in the last 8760 hours CBG:  Lab 12/05/11 0729 12/04/11 2104 12/04/11 1753 12/04/11 1251 12/04/11 0815  GLUCAP 81 149* 180* 171* 150*    Time coordinating discharge: 20 minutes  Signed:  Rhetta Mura  [facility] 12/05/2011, 12:05 PM

## 2011-12-05 NOTE — Progress Notes (Signed)
Per pt choice [facility] to provide [facility] services upon discharge. [facility] notified of referral. Md orders, H/P, demographics faxed to [facility] at [phone]. Pt to discharge home with spouse. Family to provide tx home. No other needs specified.   [name] [phone]

## 2011-12-05 NOTE — Progress Notes (Signed)
Per pt choice [facility] to provide [facility] services upon discharge. Patient referral placed via [facility]. CM faxed MD orders to [facility] at [phone]. Patient's spouse to assist in home care & provide tx home. No other needs specified.  [name] [phone]

## 2011-12-05 NOTE — Progress Notes (Signed)
Gave pt discharge instructions and prescription and explained them to him. Took out his IV. Pt left via wheelchair with tech in no acute distress.

## 2011-12-20 ENCOUNTER — Encounter (HOSPITAL_COMMUNITY): Payer: Self-pay | Admitting: *Deleted

## 2011-12-20 ENCOUNTER — Inpatient Hospital Stay (HOSPITAL_COMMUNITY)
Admission: EM | Admit: 2011-12-20 | Discharge: 2011-12-24 | DRG: 093 | Disposition: A | Discharge status: Home / Self Care | Payer: Medicare Other | Attending: Internal Medicine | Admitting: Internal Medicine

## 2011-12-20 ENCOUNTER — Inpatient Hospital Stay (HOSPITAL_COMMUNITY): Payer: Medicare Other

## 2011-12-20 ENCOUNTER — Emergency Department (HOSPITAL_COMMUNITY): Payer: Medicare Other

## 2011-12-20 DIAGNOSIS — R404 Transient alteration of awareness: Secondary | ICD-10-CM

## 2011-12-20 DIAGNOSIS — G4733 Obstructive sleep apnea (adult) (pediatric): Secondary | ICD-10-CM

## 2011-12-20 DIAGNOSIS — R251 Tremor, unspecified: Secondary | ICD-10-CM

## 2011-12-20 DIAGNOSIS — R410 Disorientation, unspecified: Secondary | ICD-10-CM

## 2011-12-20 DIAGNOSIS — K3184 Gastroparesis: Secondary | ICD-10-CM

## 2011-12-20 DIAGNOSIS — I10 Essential (primary) hypertension: Secondary | ICD-10-CM

## 2011-12-20 DIAGNOSIS — G909 Disorder of the autonomic nervous system, unspecified: Secondary | ICD-10-CM

## 2011-12-20 DIAGNOSIS — M545 Low back pain, unspecified: Secondary | ICD-10-CM

## 2011-12-20 DIAGNOSIS — I129 Hypertensive chronic kidney disease with stage 1 through stage 4 chronic kidney disease, or unspecified chronic kidney disease: Secondary | ICD-10-CM | POA: Diagnosis present

## 2011-12-20 DIAGNOSIS — K5900 Constipation, unspecified: Secondary | ICD-10-CM | POA: Diagnosis present

## 2011-12-20 DIAGNOSIS — E1165 Type 2 diabetes mellitus with hyperglycemia: Secondary | ICD-10-CM

## 2011-12-20 DIAGNOSIS — I5022 Chronic systolic (congestive) heart failure: Secondary | ICD-10-CM

## 2011-12-20 DIAGNOSIS — M79609 Pain in unspecified limb: Secondary | ICD-10-CM

## 2011-12-20 DIAGNOSIS — N179 Acute kidney failure, unspecified: Secondary | ICD-10-CM

## 2011-12-20 DIAGNOSIS — IMO0002 Reserved for concepts with insufficient information to code with codable children: Secondary | ICD-10-CM

## 2011-12-20 DIAGNOSIS — G2581 Restless legs syndrome: Secondary | ICD-10-CM

## 2011-12-20 DIAGNOSIS — L739 Follicular disorder, unspecified: Secondary | ICD-10-CM

## 2011-12-20 DIAGNOSIS — D649 Anemia, unspecified: Secondary | ICD-10-CM

## 2011-12-20 DIAGNOSIS — I252 Old myocardial infarction: Secondary | ICD-10-CM

## 2011-12-20 DIAGNOSIS — IMO0001 Reserved for inherently not codable concepts without codable children: Secondary | ICD-10-CM

## 2011-12-20 DIAGNOSIS — I509 Heart failure, unspecified: Secondary | ICD-10-CM

## 2011-12-20 DIAGNOSIS — N189 Chronic kidney disease, unspecified: Secondary | ICD-10-CM

## 2011-12-20 DIAGNOSIS — E118 Type 2 diabetes mellitus with unspecified complications: Secondary | ICD-10-CM

## 2011-12-20 DIAGNOSIS — I428 Other cardiomyopathies: Secondary | ICD-10-CM

## 2011-12-20 DIAGNOSIS — F528 Other sexual dysfunction not due to a substance or known physiological condition: Secondary | ICD-10-CM

## 2011-12-20 DIAGNOSIS — K5909 Other constipation: Secondary | ICD-10-CM | POA: Diagnosis present

## 2011-12-20 DIAGNOSIS — E785 Hyperlipidemia, unspecified: Secondary | ICD-10-CM

## 2011-12-20 DIAGNOSIS — G4700 Insomnia, unspecified: Secondary | ICD-10-CM

## 2011-12-20 DIAGNOSIS — G40909 Epilepsy, unspecified, not intractable, without status epilepticus: Secondary | ICD-10-CM | POA: Diagnosis present

## 2011-12-20 DIAGNOSIS — N390 Urinary tract infection, site not specified: Secondary | ICD-10-CM

## 2011-12-20 DIAGNOSIS — M25579 Pain in unspecified ankle and joints of unspecified foot: Secondary | ICD-10-CM

## 2011-12-20 DIAGNOSIS — E1149 Type 2 diabetes mellitus with other diabetic neurological complication: Secondary | ICD-10-CM

## 2011-12-20 DIAGNOSIS — M542 Cervicalgia: Secondary | ICD-10-CM

## 2011-12-20 DIAGNOSIS — Z79899 Other long term (current) drug therapy: Secondary | ICD-10-CM

## 2011-12-20 DIAGNOSIS — E1142 Type 2 diabetes mellitus with diabetic polyneuropathy: Secondary | ICD-10-CM

## 2011-12-20 DIAGNOSIS — R4182 Altered mental status, unspecified: Secondary | ICD-10-CM

## 2011-12-20 DIAGNOSIS — F3289 Other specified depressive episodes: Secondary | ICD-10-CM

## 2011-12-20 DIAGNOSIS — F411 Generalized anxiety disorder: Secondary | ICD-10-CM

## 2011-12-20 DIAGNOSIS — R209 Unspecified disturbances of skin sensation: Secondary | ICD-10-CM

## 2011-12-20 DIAGNOSIS — R413 Other amnesia: Secondary | ICD-10-CM

## 2011-12-20 DIAGNOSIS — G609 Hereditary and idiopathic neuropathy, unspecified: Secondary | ICD-10-CM

## 2011-12-20 DIAGNOSIS — K219 Gastro-esophageal reflux disease without esophagitis: Secondary | ICD-10-CM

## 2011-12-20 DIAGNOSIS — M549 Dorsalgia, unspecified: Secondary | ICD-10-CM | POA: Diagnosis present

## 2011-12-20 DIAGNOSIS — I2510 Atherosclerotic heart disease of native coronary artery without angina pectoris: Secondary | ICD-10-CM

## 2011-12-20 DIAGNOSIS — N183 Chronic kidney disease, stage 3 unspecified: Secondary | ICD-10-CM

## 2011-12-20 DIAGNOSIS — M720 Palmar fascial fibromatosis [Dupuytren]: Secondary | ICD-10-CM

## 2011-12-20 DIAGNOSIS — R892 Abnormal level of other drugs, medicaments and biological substances in specimens from other organs, systems and tissues: Secondary | ICD-10-CM

## 2011-12-20 DIAGNOSIS — F329 Major depressive disorder, single episode, unspecified: Secondary | ICD-10-CM

## 2011-12-20 DIAGNOSIS — D72829 Elevated white blood cell count, unspecified: Secondary | ICD-10-CM

## 2011-12-20 DIAGNOSIS — G47419 Narcolepsy without cataplexy: Principal | ICD-10-CM | POA: Diagnosis present

## 2011-12-20 DIAGNOSIS — M129 Arthropathy, unspecified: Secondary | ICD-10-CM

## 2011-12-20 DIAGNOSIS — F17200 Nicotine dependence, unspecified, uncomplicated: Secondary | ICD-10-CM | POA: Diagnosis present

## 2011-12-20 DIAGNOSIS — G5600 Carpal tunnel syndrome, unspecified upper limb: Secondary | ICD-10-CM

## 2011-12-20 DIAGNOSIS — G40409 Other generalized epilepsy and epileptic syndromes, not intractable, without status epilepticus: Secondary | ICD-10-CM

## 2011-12-20 DIAGNOSIS — R634 Abnormal weight loss: Secondary | ICD-10-CM

## 2011-12-20 LAB — URINE MICROSCOPIC-ADD ON

## 2011-12-20 LAB — GLUCOSE, CAPILLARY
Glucose-Capillary: 123 mg/dL — ABNORMAL HIGH (ref 70–99)
Glucose-Capillary: 134 mg/dL — ABNORMAL HIGH (ref 70–99)
Glucose-Capillary: 192 mg/dL — ABNORMAL HIGH (ref 70–99)
Glucose-Capillary: 173 mg/dL — ABNORMAL HIGH (ref 70–99)

## 2011-12-20 LAB — URINALYSIS, ROUTINE W REFLEX MICROSCOPIC
Bilirubin Urine: NEGATIVE
Ketones, ur: NEGATIVE mg/dL
Nitrite: NEGATIVE
Protein, ur: NEGATIVE mg/dL
Specific Gravity, Urine: <1.005 — ABNORMAL LOW (ref 1.005–1.030)
Urobilinogen, UA: 0.2 mg/dL (ref 0.0–1.0)

## 2011-12-20 LAB — HEPATIC FUNCTION PANEL
Bilirubin, Direct: <0.1 mg/dL (ref 0.0–0.3)
Total Protein: 6.1 g/dL (ref 6.0–8.3)

## 2011-12-20 LAB — CBC
HCT: 38.5 % — ABNORMAL LOW (ref 39.0–52.0)
MCH: 28.8 pg (ref 26.0–34.0)
MCHC: 33 g/dL (ref 30.0–36.0)
RDW: 14.3 % (ref 11.5–15.5)

## 2011-12-20 LAB — ETHANOL: Alcohol, Ethyl (B): <11 mg/dL (ref 0–11)

## 2011-12-20 LAB — BASIC METABOLIC PANEL
BUN: 24 mg/dL — ABNORMAL HIGH (ref 6–23)
Calcium: 9.6 mg/dL (ref 8.4–10.5)
Creatinine, Ser: 2.16 mg/dL — ABNORMAL HIGH (ref 0.50–1.35)
GFR calc Af Amer: 37 mL/min — ABNORMAL LOW (ref >90)
GFR calc non Af Amer: 32 mL/min — ABNORMAL LOW (ref >90)
Potassium: 4.2 mEq/L (ref 3.5–5.1)

## 2011-12-20 LAB — ACETAMINOPHEN LEVEL: Acetaminophen (Tylenol), Serum: <15 ug/mL (ref 10–30)

## 2011-12-20 LAB — RAPID URINE DRUG SCREEN, HOSP PERFORMED: Barbiturates: NOT DETECTED

## 2011-12-20 MED ORDER — GABAPENTIN 400 MG PO CAPS
400.0000 mg | ORAL_CAPSULE | Freq: Three times a day (TID) | ORAL | Status: DC
  Administered 2011-12-20 – 2011-12-24 (×12): 400 mg via ORAL
  Filled 2011-12-20 (×14): qty 1

## 2011-12-20 MED ORDER — DULOXETINE HCL 30 MG PO CPEP
30.0000 mg | ORAL_CAPSULE | Freq: Every day | ORAL | Status: DC
  Administered 2011-12-20 – 2011-12-23 (×4): 30 mg via ORAL
  Filled 2011-12-20 (×4): qty 1

## 2011-12-20 MED ORDER — DOCUSATE SODIUM 100 MG PO CAPS
100.0000 mg | ORAL_CAPSULE | Freq: Every day | ORAL | Status: DC
  Administered 2011-12-20 – 2011-12-23 (×4): 100 mg via ORAL
  Filled 2011-12-20 (×3): qty 1

## 2011-12-20 MED ORDER — SODIUM CHLORIDE 0.9 % IV SOLN
INTRAVENOUS | Status: DC
  Administered 2011-12-20: 500 mL via INTRAVENOUS
  Administered 2011-12-21: 1000 mL via INTRAVENOUS
  Administered 2011-12-22: 13:00:00 via INTRAVENOUS

## 2011-12-20 MED ORDER — ROPINIROLE HCL 1 MG PO TABS
2.0000 mg | ORAL_TABLET | Freq: Every day | ORAL | Status: DC
  Administered 2011-12-20 – 2011-12-23 (×4): 2 mg via ORAL
  Filled 2011-12-20 (×7): qty 2

## 2011-12-20 MED ORDER — OXYCODONE HCL 5 MG PO TABS
5.0000 mg | ORAL_TABLET | ORAL | Status: DC | PRN
  Administered 2011-12-20 – 2011-12-21 (×3): 10 mg via ORAL
  Filled 2011-12-20 (×3): qty 2

## 2011-12-20 MED ORDER — ACETAMINOPHEN 325 MG PO TABS
650.0000 mg | ORAL_TABLET | Freq: Four times a day (QID) | ORAL | Status: DC | PRN
  Administered 2011-12-23: 650 mg via ORAL
  Filled 2011-12-20: qty 2

## 2011-12-20 MED ORDER — SODIUM CHLORIDE 0.9 % IV BOLUS (SEPSIS)
1000.0000 mL | Freq: Once | INTRAVENOUS | Status: AC
  Administered 2011-12-20: 1000 mL via INTRAVENOUS

## 2011-12-20 MED ORDER — SODIUM CHLORIDE 0.9 % IV SOLN
500.0000 mg | Freq: Two times a day (BID) | INTRAVENOUS | Status: AC
  Administered 2011-12-20 (×2): 500 mg via INTRAVENOUS
  Filled 2011-12-20 (×2): qty 5

## 2011-12-20 MED ORDER — BACLOFEN 10 MG PO TABS
10.0000 mg | ORAL_TABLET | Freq: Three times a day (TID) | ORAL | Status: DC
  Administered 2011-12-20 – 2011-12-23 (×9): 10 mg via ORAL
  Filled 2011-12-20 (×13): qty 1

## 2011-12-20 MED ORDER — DOCUSATE SODIUM 100 MG PO CAPS
200.0000 mg | ORAL_CAPSULE | Freq: Every day | ORAL | Status: DC
  Administered 2011-12-20 – 2011-12-24 (×5): 200 mg via ORAL
  Filled 2011-12-20 (×3): qty 2
  Filled 2011-12-20 (×3): qty 1

## 2011-12-20 MED ORDER — PANTOPRAZOLE SODIUM 40 MG PO TBEC
40.0000 mg | DELAYED_RELEASE_TABLET | Freq: Every day | ORAL | Status: DC
  Administered 2011-12-21 – 2011-12-24 (×4): 40 mg via ORAL
  Filled 2011-12-20 (×4): qty 1

## 2011-12-20 MED ORDER — ONDANSETRON HCL 4 MG/2ML IJ SOLN: 4.0000 mg | INTRAMUSCULAR | Status: DC | PRN

## 2011-12-20 MED ORDER — CLONAZEPAM 0.5 MG PO TABS
0.5000 mg | ORAL_TABLET | Freq: Every day | ORAL | Status: DC
  Administered 2011-12-20: 0.5 mg via ORAL
  Filled 2011-12-20 (×4): qty 1

## 2011-12-20 MED ORDER — ROPINIROLE HCL 1 MG PO TABS: 2.0000 mg | ORAL_TABLET | Freq: Every day | ORAL | Status: DC

## 2011-12-20 MED ORDER — LEVETIRACETAM 500 MG PO TABS
500.0000 mg | ORAL_TABLET | Freq: Two times a day (BID) | ORAL | Status: DC
  Administered 2011-12-21 – 2011-12-24 (×7): 500 mg via ORAL
  Filled 2011-12-20 (×9): qty 1

## 2011-12-20 MED ORDER — PANTOPRAZOLE SODIUM 40 MG IV SOLR
40.0000 mg | INTRAVENOUS | Status: AC
  Administered 2011-12-20: 40 mg via INTRAVENOUS
  Filled 2011-12-20 (×2): qty 40

## 2011-12-20 MED ORDER — INSULIN ASPART 100 UNIT/ML ~~LOC~~ SOLN
0.0000 [IU] | SUBCUTANEOUS | Status: DC
  Administered 2011-12-20 (×2): 1 [IU] via SUBCUTANEOUS

## 2011-12-20 MED ORDER — INSULIN ASPART 100 UNIT/ML ~~LOC~~ SOLN
0.0000 [IU] | Freq: Three times a day (TID) | SUBCUTANEOUS | Status: DC
  Administered 2011-12-21: 2 [IU] via SUBCUTANEOUS
  Administered 2011-12-21: 5 [IU] via SUBCUTANEOUS
  Administered 2011-12-22: 2 [IU] via SUBCUTANEOUS
  Administered 2011-12-22 (×2): 1 [IU] via SUBCUTANEOUS
  Administered 2011-12-23: 2 [IU] via SUBCUTANEOUS
  Administered 2011-12-23: 3 [IU] via SUBCUTANEOUS
  Administered 2011-12-24: 1 [IU] via SUBCUTANEOUS
  Administered 2011-12-24: 2 [IU] via SUBCUTANEOUS

## 2011-12-20 MED ORDER — SODIUM CHLORIDE 0.9 % IJ SOLN: 3.0000 mL | Freq: Two times a day (BID) | INTRAMUSCULAR | Status: DC

## 2011-12-20 MED ORDER — BISACODYL 10 MG RE SUPP: 10.0000 mg | Freq: Every day | RECTAL | Status: DC | PRN

## 2011-12-20 MED ORDER — FLUOXETINE HCL 20 MG PO CAPS
40.0000 mg | ORAL_CAPSULE | Freq: Every day | ORAL | Status: DC
  Administered 2011-12-20 – 2011-12-24 (×5): 40 mg via ORAL
  Filled 2011-12-20 (×5): qty 2

## 2011-12-20 MED ORDER — ADULT MULTIVITAMIN W/MINERALS CH
1.0000 | ORAL_TABLET | Freq: Every day | ORAL | Status: DC
  Administered 2011-12-20 – 2011-12-24 (×5): 1 via ORAL
  Filled 2011-12-20 (×5): qty 1

## 2011-12-20 MED ORDER — ACETAMINOPHEN 650 MG RE SUPP: 650.0000 mg | Freq: Four times a day (QID) | RECTAL | Status: DC | PRN

## 2011-12-20 MED ORDER — OXYCODONE HCL 5 MG PO TABS
5.0000 mg | ORAL_TABLET | ORAL | Status: DC | PRN
  Administered 2011-12-20: 10 mg via ORAL
  Filled 2011-12-20: qty 2

## 2011-12-20 MED ORDER — FLEET ENEMA 7-19 GM/118ML RE ENEM
1.0000 | ENEMA | Freq: Once | RECTAL | Status: AC | PRN
  Filled 2011-12-20: qty 1

## 2011-12-20 MED ORDER — INSULIN NPH (HUMAN) (ISOPHANE) 100 UNIT/ML ~~LOC~~ SUSP
10.0000 [IU] | Freq: Two times a day (BID) | SUBCUTANEOUS | Status: DC
  Administered 2011-12-20 – 2011-12-21 (×2): 10 [IU] via SUBCUTANEOUS
  Filled 2011-12-20: qty 10

## 2011-12-20 MED ORDER — ENOXAPARIN SODIUM 40 MG/0.4ML ~~LOC~~ SOLN
40.0000 mg | SUBCUTANEOUS | Status: DC
Start: 2011-12-20 — End: 2011-12-20
  Filled 2011-12-20 (×2): qty 0.4

## 2011-12-20 MED ORDER — POTASSIUM CHLORIDE IN NACL 20-0.9 MEQ/L-% IV SOLN
INTRAVENOUS | Status: DC
  Administered 2011-12-20: 1000 mL via INTRAVENOUS
  Filled 2011-12-20 (×3): qty 1000

## 2011-12-20 MED ORDER — HYDROMORPHONE HCL 2 MG PO TABS: 2.0000 mg | ORAL_TABLET | Freq: Once | ORAL | Status: DC

## 2011-12-20 NOTE — ED Notes (Signed)
pts wife went home  Contact info:  Cindy. Cell - [phone] Home -[phone]

## 2011-12-20 NOTE — ED Notes (Signed)
CBG 127 

## 2011-12-20 NOTE — Progress Notes (Signed)
Paged Dr. Sharon Seller regarding pt CXR order clarification, as well as pts BP running 160-170 systolic.  Orders were received to change CXR to portable.  No new orders were received regarding pt BP.  Will continue to monitor.  Salomon Mast, RN

## 2011-12-20 NOTE — H&P (Signed)
[facility] History and Physical  [name]  AVW:[id number]  DOB: 1956/03/26   DOA: 12/20/2011   PCP:   Cristie Hem, MD   Chief Complaint:  Altered mental status since last night  HPI: [name] is an 56 y.o. male.   Obese Caucasian gentleman with multiple medical problems, including diabetes hypertension and than unspecified myoclonic disorder being treated by a neurologist in [place], (?Dr. Garth Schlatter, MD)  presents with his third episode of sudden unexplained mental status change.  The history was taken by phone from his wife, and was very difficult because she self-reports personal neurological problem should make it difficult for her to concentrate to give a history. However, she reports this first happened about 4 weeks ago when he suddenly became glassy-eyed, and then lethargic and difficult to arouse, but soon came back to his normal mental state. He did not get medical attention at that time. The second episode resulted in a prolonged lethargic state and she was admitted to [facility] on December 03, 2011 and kept overnight. No clear cause was identified.   Last night, while engaged in activity, patient again suddenly became glassy eyed and unresponsive and then fell into a sleeplike state. There was no associated seizure-like activity, there was no history of trauma. EMS was called and patient apparently arrived to the emergency room within 45 minutes. He was not hypoglycemic.  There was some concern that he may have taken too much oxycodone, and he was given Narcan 2 mg by EMS, without significant response. Urine drug testing in the emergency room was negative, as it was in his previous admission. Patient's medication list includes not on the oxycodone and Klonopin, but also trazodone, Cymbalta, and Keppra. There is no history of alcohol use.  After 5 hours in the emergency room, patient has shown no improvement in his mental, appears to be in an on unrousable  sleep, or prolonged post ictal state, and hospitalist service was called for assistance.  Rewiew of Systems:  Unable to obtain because of patient's and wife's mental status   Past Medical History  Diagnosis Date  . Diabetes mellitus   . Hypertension   . CHF (congestive heart failure)   . CRD (chronic renal disease)   . Arthritis   . Obesity   . Depression   . Sleep apnea   . Diabetic neuropathy   . CAD (coronary artery disease)   . Mitral regurgitation   . Esophagitis, reflux 07/07/10    MW tear, erosive reflux esophagitis, 2cm hh  . S/P colonoscopy April 2012    Rourk: torturous colon, hyperplastic polyps  . Collagen vascular disease     "myoclonic "  . Tremor   . Diverticulum   . Hiatal hernia   . GERD (gastroesophageal reflux disease)     erosive    Past Surgical History  Procedure Date  . Neck surgery     discectomy  . Hand surgery     bilateral  . Colonoscopy before 2002    [place]  . Appendectomy   . Other surgical history     bone graft-knee  . Anterior lumbar corpectomy w/ fusion     C5-6/C4-7  . Colonoscopy 08/21/2010    elongaed tortuous colon otherwise normal  . Esophagogastroduodenoscopy 07/08/10    Dr. Marny Lowenstein esophageal erosions and excoriations consistent with erosive reflux esophagitis, hiatal hernia    Medications:  HOME MEDS: Prior to Admission medications   Medication Sig Start Date End Date Taking?  Authorizing Provider  baclofen (LIORESAL) 10 MG tablet Take 10 mg by mouth 3 (three) times daily.   Yes Historical Provider, MD  clonazePAM (KLONOPIN) 0.5 MG tablet 2 at night, 1 in the morning 12/05/11  Yes Rhetta Mura, MD  docusate sodium (COLACE) 100 MG capsule Take 100-200 mg by mouth 2 (two) times daily. Take 2 tablets every morning and 1 tablet at bedtime   Yes Historical Provider, MD  DULoxetine (CYMBALTA) 30 MG capsule Take 30 mg by mouth daily.   Yes Historical Provider, MD  furosemide (LASIX) 40 MG tablet Take 0.5 tablets (20  mg total) by mouth as needed. Fluid retention 12/05/11  Yes Rhetta Mura, MD  gabapentin (NEURONTIN) 400 MG capsule Take 400 mg by mouth 3 (three) times daily.   Yes Historical Provider, MD  hydrOXYzine (ATARAX/VISTARIL) 10 MG tablet Take 10 mg by mouth 3 (three) times daily as needed.   Yes Historical Provider, MD  levETIRAcetam (KEPPRA) 500 MG tablet Take 500 mg by mouth every 12 (twelve) hours.   Yes Historical Provider, MD  Melatonin 10 MG TABS Take by mouth.   Yes Historical Provider, MD  Multiple Vitamins-Minerals (CENTRUM SILVER ULTRA MENS) TABS Take 1 tablet by mouth daily.     Yes Historical Provider, MD  oxycodone (ROXICODONE) 30 MG immediate release tablet Take 30 mg by mouth every 6 (six) hours as needed. 02/19/11  Yes Christiane Ha, MD  pantoprazole (PROTONIX) 40 MG tablet Take 40 mg by mouth daily.   Yes Historical Provider, MD  polyethylene glycol (MIRALAX / GLYCOLAX) packet Take 17 g by mouth daily.   Yes Historical Provider, MD  rOPINIRole (REQUIP) 2 MG tablet Take 2 mg by mouth at bedtime.   Yes Historical Provider, MD  spironolactone (ALDACTONE) 25 MG tablet Take 25 mg by mouth at bedtime.  07/08/10  Yes Historical Provider, MD  traZODone (DESYREL) 100 MG tablet Take 50 mg by mouth at bedtime. 02/19/11  Yes Christiane Ha, MD  FLUoxetine (PROZAC) 40 MG capsule Take 60 mg daily am 12/05/11   Rhetta Mura, MD  insulin NPH-insulin regular (NOVOLIN 70/30) (70-30) 100 UNIT/ML injection Inject 15 Units into the skin 2 (two) times daily with a meal. 12/05/11   Rhetta Mura, MD     Allergies:  No Known Allergies  Social History:   reports that he has been smoking Cigarettes.  He has a 20 pack-year smoking history. He has never used smokeless tobacco. He reports that he does not drink alcohol or use illicit drugs.  Family History: Family History  Problem Relation Age of Onset  . Stroke Father 12  . Stroke Mother 13  . Colon cancer Neg Hx   . Crohn's  disease Neg Hx   . Cirrhosis Neg Hx   . Ulcerative colitis Neg Hx   . Stomach cancer Neg Hx      Physical Exam: Filed Vitals:   12/20/11 0246 12/20/11 0300 12/20/11 0400 12/20/11 0500  BP: 139/73 132/56 139/61 136/62  Pulse:  80 78 74  Resp:  14 14 20   SpO2:  95% 96% 97%   Blood pressure 136/62, pulse 74, resp. rate 20, SpO2 97.00%.  GEN:  Lethargic obese Caucasian gentleman lying in the stretcher; unable to be cooperative with exam PSYCH:  alert and oriented x1 (" I am in some hospital");  HEENT: Mucous membranes pink, dry, and anicteric; pupils 3 mm, PERRLA; EOM intact; no cervical lymphadenopathy nor thyromegaly or carotid bruit; no JVD; thick neck Breasts:: Not  examined CHEST WALL: No tenderness CHEST: Normal respiration, clear to auscultation bilaterally HEART: Regular rate and rhythm; no murmurs rubs or gallops BACK: ; no CVA tenderness ABDOMEN: Obese, soft non-tender; no masses, no organomegaly, normal abdominal bowel sounds; ; no intertriginous candida. Rectal Exam: Not done EXTREMITIES: ; age-appropriate arthropathy of the hands and knees; no edema; no ulcerations. Genitalia: not examined PULSES: 2+ and symmetric SKIN: Normal hydration no rash or ulceration CNS: Cranial nerves 2-12 grossly intact no focal lateralizing neurologic deficit   Labs on Admission:  Basic Metabolic Panel:  Lab 12/20/11 0865  NA 138  K 4.2  CL 102  CO2 29  GLUCOSE 122*  BUN 24*  CREATININE 2.16*  CALCIUM 9.6  MG --  PHOS --   Liver Function Tests: No results found for this basename: AST:5,ALT:5,ALKPHOS:5,BILITOT:5,PROT:5,ALBUMIN:5 in the last 168 hours No results found for this basename: LIPASE:5,AMYLASE:5 in the last 168 hours  Lab 12/20/11 0314  AMMONIA 29   CBC:  Lab 12/20/11 0128  WBC 6.9  NEUTROABS --  HGB 12.7*  HCT 38.5*  MCV 87.3  PLT 204   Cardiac Enzymes: No results found for this basename: CKTOTAL:5,CKMB:5,CKMBINDEX:5,TROPONINI:5 in the last 168  hours BNP: No components found with this basename: POCBNP:5 CBG:  Lab 12/20/11 0216  GLUCAP 127*    Results for orders placed during the hospital encounter of 12/20/11 (from the past 48 hour(s))  URINALYSIS, ROUTINE W REFLEX MICROSCOPIC     Status: Abnormal   Collection Time   12/20/11  1:15 AM      Component Value Range Comment   Color, Urine YELLOW  YELLOW    APPearance CLEAR  CLEAR    Specific Gravity, Urine <1.005 (*) 1.005 - 1.030    pH 6.0  5.0 - 8.0    Glucose, UA NEGATIVE  NEGATIVE mg/dL    Hgb urine dipstick TRACE (*) NEGATIVE    Bilirubin Urine NEGATIVE  NEGATIVE    Ketones, ur NEGATIVE  NEGATIVE mg/dL    Protein, ur NEGATIVE  NEGATIVE mg/dL    Urobilinogen, UA 0.2  0.0 - 1.0 mg/dL    Nitrite NEGATIVE  NEGATIVE    Leukocytes, UA NEGATIVE  NEGATIVE   URINE MICROSCOPIC-ADD ON     Status: Normal   Collection Time   12/20/11  1:15 AM      Component Value Range Comment   Squamous Epithelial / LPF RARE  RARE    WBC, UA 0-2  <3 WBC/hpf    RBC / HPF 0-2  <3 RBC/hpf    Bacteria, UA RARE  RARE   URINE RAPID DRUG SCREEN (HOSP PERFORMED)     Status: Normal   Collection Time   12/20/11  1:20 AM      Component Value Range Comment   Opiates NONE DETECTED  NONE DETECTED    Cocaine NONE DETECTED  NONE DETECTED    Benzodiazepines NONE DETECTED  NONE DETECTED    Amphetamines NONE DETECTED  NONE DETECTED    Tetrahydrocannabinol NONE DETECTED  NONE DETECTED    Barbiturates NONE DETECTED  NONE DETECTED   CBC     Status: Abnormal   Collection Time   12/20/11  1:28 AM      Component Value Range Comment   WBC 6.9  4.0 - 10.5 K/uL    RBC 4.41  4.22 - 5.81 MIL/uL    Hemoglobin 12.7 (*) 13.0 - 17.0 g/dL    HCT 78.4 (*) 69.6 - 52.0 %    MCV 87.3  78.0 - 100.0 fL    MCH 28.8  26.0 - 34.0 pg    MCHC 33.0  30.0 - 36.0 g/dL    RDW 16.1  09.6 - 04.5 %    Platelets 204  150 - 400 K/uL   BASIC METABOLIC PANEL     Status: Abnormal   Collection Time   12/20/11  1:28 AM      Component Value  Range Comment   Sodium 138  135 - 145 mEq/L    Potassium 4.2  3.5 - 5.1 mEq/L    Chloride 102  96 - 112 mEq/L    CO2 29  19 - 32 mEq/L    Glucose, Bld 122 (*) 70 - 99 mg/dL    BUN 24 (*) 6 - 23 mg/dL    Creatinine, Ser 4.09 (*) 0.50 - 1.35 mg/dL    Calcium 9.6  8.4 - 81.1 mg/dL    GFR calc non Af Amer 32 (*) >90 mL/min    GFR calc Af Amer 37 (*) >90 mL/min   ACETAMINOPHEN LEVEL     Status: Normal   Collection Time   12/20/11  1:28 AM      Component Value Range Comment   Acetaminophen (Tylenol), Serum <15.0  10 - 30 ug/mL   ETHANOL     Status: Normal   Collection Time   12/20/11  1:28 AM      Component Value Range Comment   Alcohol, Ethyl (B) <11  0 - 11 mg/dL   GLUCOSE, CAPILLARY     Status: Abnormal   Collection Time   12/20/11  2:16 AM      Component Value Range Comment   Glucose-Capillary 127 (*) 70 - 99 mg/dL   AMMONIA     Status: Normal   Collection Time   12/20/11  3:14 AM      Component Value Range Comment   Ammonia 29  11 - 60 umol/L      Radiological Exams on Admission: No results found.   Assessment/Plan Present on Admission:   .Altered mental status, unclear etiology; a possible toxic metabolic encephalopathy, related to dehydration and the use of multiple psychotropic medications, although sudden onset makes it less likely; possible narcoleptic-type syndrome  Dehydration/acute on chronic renal failure  Multiple chronic problems including: .CKD (chronic kidney disease), stage III .DM type 2 with diabetic peripheral neuropathy .SLEEP APNEA, OBSTRUCTIVE .RESTLESS LEG SYNDROME .HYPERLIPIDEMIA .Morbid obesity .HYPERTENSION .CORONARY ARTERY DISEASE .CONGESTIVE HEART FAILURE .history of Myoclonic seizure disorder  .NECK PAIN, CHRONIC   PLAN: We'll admit this patient to [facility], where he can get the benefits of a neurology evaluation if he does not wake up soon. Will hydrate and hold all psychotropic and sedating medications, but will continue  Keppra. We will get another CT scan of his head this admission, although the yield is likely to be low considering the presentation is exactly the same as last time.  Will keep him n.p.o. and give sliding scale insulin for diabetic coverage.  Other plans as per orders.  Code Status: FULL CODE  Family Communication:  Wife,  Arline Asp. Cell - [phone] Home -[phone]  Disposition Plan: If he wakes up in the next few hours and is back to his baseline state, he can likely be discharged to followup with his primary neurologist, otherwise a neurology evaluation may be useful.  Critical care time: 60 minutes.   Layza Summa Nocturnist [facility] Pager [phone]   12/20/2011, 7:17 AM

## 2011-12-20 NOTE — ED Notes (Signed)
Hospitalist in to consult patient at this time.

## 2011-12-20 NOTE — ED Notes (Addendum)
EMS called out for unresponsive pt. Wife states pt has taken oxycodone. EMS reports wife stating "I told you, if you did this again, I was calling 911." Pt unresponsive upon ems arrival.  Pt given 2mg  of narcan with little results.

## 2011-12-20 NOTE — Progress Notes (Signed)
[facility] Progress Note [place] TEAM 1 - Stepdown/ICU TEAM   [name] ZOX:[id number] DOB: [birth year] DOA: 12/20/2011 PCP: Cristie Hem, MD  Brief narrative: 56 y.o. male with medical problems including diabetes hypertension and an unspecified myoclonic disorder being treated by a neurologist in [place], (?Dr. Garth Schlatter, MD) who presents with his third episode of sudden unexplained mental status change.  The history was taken by phone from his wife, and was very difficult because she self-reports personal neurological problems making it difficult for her to concentrate. She reports this first happened about 4 weeks ago when the pt suddenly became glassy-eyed, and then lethargic and difficult to arouse, but soon came back to his normal mental state. He did not get medical attention at that time. The second episode resulted in a prolonged lethargic state and he was admitted to [facility] on December 03, 2011 and kept overnight. No clear cause was identified.  The night prior to his admission the patient again suddenly became glassy eyed and unresponsive and then fell into a sleeplike state. There was no associated seizure-like activity, there was no history of trauma. EMS was called and patient apparently arrived to the emergency room within 45 minutes. He was not hypoglycemic.  There was some concern that he may have taken too much oxycodone, and he was given Narcan 2 mg by EMS, without significant response. Urine drug testing in the emergency room was negative, as it was in his previous admission. Patient's medication list includes not on the oxycodone and Klonopin, but also trazodone, Cymbalta, and Keppra. There is no history of alcohol use.  After 5 hours in the emergency room, patient has shown no improvement in his mental, appears to be in an on unrousable sleep, or prolonged post ictal state, and hospitalist service was called for assistance.  Assessment/Plan:  Altered  mental status - episodic/transient  Diabetes mellitus Hemoglobin A1c 7.4 earlier this month  Hypertension  Chronic kidney disease stage III Baseline creatinine appears to be approximately 2.5  Obesity  Sleep apnea  Tobacco abuse  Chronic neck and back pain  Code Status: Full Disposition Plan: Monitor in step down unit  Consultants: None  Procedures: None  Antibiotics: None  DVT prophylaxis: Lovenox  HPI/Subjective: Patient is seen for followup visit.   Objective: Blood pressure 168/82, pulse 81, temperature 98.1 F (36.7 C), temperature source Oral, resp. rate 13, SpO2 99.00%.  Intake/Output Summary (Last 24 hours) at 12/20/11 0953 Last data filed at 12/20/11 0747  Gross per 24 hour  Intake      0 ml  Output    675 ml  Net   -675 ml     Exam: Patient is seen for followup exam.  Data Reviewed: Basic Metabolic Panel:  Lab 12/20/11 8119  NA 138  K 4.2  CL 102  CO2 29  GLUCOSE 122*  BUN 24*  CREATININE 2.16*  CALCIUM 9.6  MG --  PHOS --    Lab 12/20/11 0314  AMMONIA 29   CBC:  Lab 12/20/11 0128  WBC 6.9  NEUTROABS --  HGB 12.7*  HCT 38.5*  MCV 87.3  PLT 204   CBG:  Lab 12/20/11 0216  GLUCAP 127*   Studies:  Recent x-ray studies have been reviewed in detail by the Attending Physician  Scheduled Meds:  Reviewed in detail by the Attending Physician   Lonia Blood, MD [facility] Office  [phone] Pager [phone]  On-Call/Text Page:      [email or web address]  password [email or web address]  If 7PM-7AM, please contact night-coverage [email or web address] Password [facility] 12/20/2011, 9:53 AM   LOS: 0 days

## 2011-12-20 NOTE — ED Provider Notes (Signed)
History     CSN: [id number]  Arrival date & time 12/20/11  0105   First MD Initiated Contact with Patient 12/20/11 0109      Chief Complaint  Patient presents with  . Drug Overdose    (Consider location/radiation/quality/duration/timing/severity/associated sxs/prior treatment) HPI Level 5 caveat due to ALS Othar R Buddenhagen is a 56 y.o. male with a hx of CHF, DM, HTN, CKD, CAD, COPD, myoclonic activity brought in by ambulance, who presents to the Emergency Department complaining of altered level on consciousness.Patient is unable to give a history due to being too sleepy. EMS reports called to home for unresponsive. Wife states he has been taking his medicines a directed. They administered Narcan enroute with some improvement. Patient has had a similar presentation 12/03/11 at [facility] and was admitted. Labs were normal at the time, CT of his head was normal. It was felt unresponsiveness might be due to polypharmacy.  PCP Dr. Jiles Garter, [facility]  Dr. Jena Gauss Neurologist Dr. Judith Blonder, [facility]     Past Medical History  Diagnosis Date  . Diabetes mellitus   . Hypertension   . CHF (congestive heart failure)   . CRD (chronic renal disease)   . Arthritis   . Obesity   . Depression   . Sleep apnea   . Diabetic neuropathy   . CAD (coronary artery disease)   . Mitral regurgitation   . Esophagitis, reflux 07/07/10    MW tear, erosive reflux esophagitis, 2cm hh  . S/P colonoscopy April 2012    Rourk: torturous colon, hyperplastic polyps  . Collagen vascular disease     "myoclonic "  . Tremor   . Diverticulum   . Hiatal hernia   . GERD (gastroesophageal reflux disease)     erosive    Past Surgical History  Procedure Date  . Neck surgery     discectomy  . Hand surgery     bilateral  . Colonoscopy before 2002    [place]  . Appendectomy   . Other surgical history     bone graft-knee  . Anterior lumbar corpectomy w/ fusion     C5-6/C4-7  . Colonoscopy 08/21/2010    elongaed tortuous  colon otherwise normal  . Esophagogastroduodenoscopy 07/08/10    Dr. Marny Lowenstein esophageal erosions and excoriations consistent with erosive reflux esophagitis, hiatal hernia    Family History  Problem Relation Age of Onset  . Stroke Father 64  . Stroke Mother 32  . Colon cancer Neg Hx   . Crohn's disease Neg Hx   . Cirrhosis Neg Hx   . Ulcerative colitis Neg Hx   . Stomach cancer Neg Hx     History  Substance Use Topics  . Smoking status: Current Everyday Smoker -- 0.5 packs/day for 40 years    Types: Cigarettes  . Smokeless tobacco: Never Used   Comment: ordered nicotine patches. start date is 27 July 2010  . Alcohol Use: No      Review of Systems  Unable to perform ROS: Other    Allergies  Review of patient's allergies indicates no known allergies.  Home Medications   Current Outpatient Rx  Name Route Sig Dispense Refill  . BACLOFEN 10 MG PO TABS Oral Take 10 mg by mouth 3 (three) times daily.    Marland Kitchen CLONAZEPAM 0.5 MG PO TABS  2 at night, 1 in the morning 30 tablet 0  . DOCUSATE SODIUM 100 MG PO CAPS Oral Take 100-200 mg by mouth 2 (two) times daily. Take  2 tablets every morning and 1 tablet at bedtime    . DULOXETINE HCL 30 MG PO CPEP Oral Take 30 mg by mouth daily.    . FUROSEMIDE 40 MG PO TABS Oral Take 0.5 tablets (20 mg total) by mouth as needed. Fluid retention 30 tablet 0  . GABAPENTIN 400 MG PO CAPS Oral Take 400 mg by mouth 3 (three) times daily.    Marland Kitchen HYDROXYZINE HCL 10 MG PO TABS Oral Take 10 mg by mouth 3 (three) times daily as needed.    Marland Kitchen LEVETIRACETAM 500 MG PO TABS Oral Take 500 mg by mouth every 12 (twelve) hours.    Marland Kitchen MELATONIN 10 MG PO TABS Oral Take by mouth.    . CENTRUM SILVER ULTRA MENS PO TABS Oral Take 1 tablet by mouth daily.      . OXYCODONE HCL 30 MG PO TABS Oral Take 30 mg by mouth every 6 (six) hours as needed.    Marland Kitchen PANTOPRAZOLE SODIUM 40 MG PO TBEC Oral Take 40 mg by mouth daily.    Marland Kitchen POLYETHYLENE GLYCOL 3350 PO PACK Oral Take 17 g by  mouth daily.    Marland Kitchen ROPINIROLE HCL 2 MG PO TABS Oral Take 2 mg by mouth at bedtime.    . SPIRONOLACTONE 25 MG PO TABS Oral Take 25 mg by mouth at bedtime.     . TRAZODONE HCL 100 MG PO TABS Oral Take 50 mg by mouth at bedtime.    . FLUOXETINE HCL 40 MG PO CAPS  Take 60 mg daily am 30 capsule 0  . INSULIN ISOPHANE & REGULAR (70-30) 100 UNIT/ML [place] SUSP Subcutaneous Inject 15 Units into the skin 2 (two) times daily with a meal. 10 mL     BP 139/73  Pulse 79  Resp 16  SpO2 96%  Physical Exam  Nursing note and vitals reviewed. Constitutional: He appears well-developed and well-nourished.       Awake, alert, nontoxic appearance.  HENT:  Head: Normocephalic and atraumatic.  Right Ear: External ear normal.  Left Ear: External ear normal.  Mouth/Throat: Oropharynx is clear and moist.       +gag  Eyes: Right eye exhibits no discharge. Left eye exhibits no discharge.  Neck: Normal range of motion. Neck supple.  Cardiovascular: Normal heart sounds.   Pulmonary/Chest: Effort normal and breath sounds normal. He exhibits no tenderness.  Abdominal: Soft. There is no tenderness. There is no rebound.  Musculoskeletal: He exhibits no tenderness.       Baseline ROM, no obvious new focal weakness.  Neurological: He has normal reflexes.       Patient will arouse momentarily to vigorous stimulation. Can tell me what his name is. Answers questions with yes and no answers. Patient is right handed.Moving all extremities.  GLASGOW COMA SCORE =(2,4,6) 12  Eyes Open: spontaneously 4, to voice 3, to pain 2, none 1 Speech: nml 5, disoriented 4, inapprop. 3, incoherent 2, none 1 Motor: nml 6, localizes 5, withdraws 4, flexor 3, ext. 2, none 1  Skin: Skin is warm and dry. No rash noted.  Psychiatric: He has a normal mood and affect.    ED Course  Procedures (including critical care time)  Results for orders placed during the hospital encounter of 12/20/11  CBC      Component Value Range   WBC 6.9  4.0 -  10.5 K/uL   RBC 4.41  4.22 - 5.81 MIL/uL   Hemoglobin 12.7 (*) 13.0 - 17.0 g/dL  HCT 38.5 (*) 39.0 - 52.0 %   MCV 87.3  78.0 - 100.0 fL   MCH 28.8  26.0 - 34.0 pg   MCHC 33.0  30.0 - 36.0 g/dL   RDW 62.1  30.8 - 65.7 %   Platelets 204  150 - 400 K/uL  BASIC METABOLIC PANEL      Component Value Range   Sodium 138  135 - 145 mEq/L   Potassium 4.2  3.5 - 5.1 mEq/L   Chloride 102  96 - 112 mEq/L   CO2 29  19 - 32 mEq/L   Glucose, Bld 122 (*) 70 - 99 mg/dL   BUN 24 (*) 6 - 23 mg/dL   Creatinine, Ser 8.46 (*) 0.50 - 1.35 mg/dL   Calcium 9.6  8.4 - 96.2 mg/dL   GFR calc non Af Amer 32 (*) >90 mL/min   GFR calc Af Amer 37 (*) >90 mL/min  URINALYSIS, ROUTINE W REFLEX MICROSCOPIC      Component Value Range   Color, Urine YELLOW  YELLOW   APPearance CLEAR  CLEAR   Specific Gravity, Urine <1.005 (*) 1.005 - 1.030   pH 6.0  5.0 - 8.0   Glucose, UA NEGATIVE  NEGATIVE mg/dL   Hgb urine dipstick TRACE (*) NEGATIVE   Bilirubin Urine NEGATIVE  NEGATIVE   Ketones, ur NEGATIVE  NEGATIVE mg/dL   Protein, ur NEGATIVE  NEGATIVE mg/dL   Urobilinogen, UA 0.2  0.0 - 1.0 mg/dL   Nitrite NEGATIVE  NEGATIVE   Leukocytes, UA NEGATIVE  NEGATIVE  URINE RAPID DRUG SCREEN (HOSP PERFORMED)      Component Value Range   Opiates NONE DETECTED  NONE DETECTED   Cocaine NONE DETECTED  NONE DETECTED   Benzodiazepines NONE DETECTED  NONE DETECTED   Amphetamines NONE DETECTED  NONE DETECTED   Tetrahydrocannabinol NONE DETECTED  NONE DETECTED   Barbiturates NONE DETECTED  NONE DETECTED  ACETAMINOPHEN LEVEL      Component Value Range   Acetaminophen (Tylenol), Serum <15.0  10 - 30 ug/mL  ETHANOL      Component Value Range   Alcohol, Ethyl (B) <11  0 - 11 mg/dL  URINE MICROSCOPIC-ADD ON      Component Value Range   Squamous Epithelial / LPF RARE  RARE   WBC, UA 0-2  <3 WBC/hpf   RBC / HPF 0-2  <3 RBC/hpf   Bacteria, UA RARE  RARE  GLUCOSE, CAPILLARY      Component Value Range   Glucose-Capillary 127 (*)  70 - 99 mg/dL  AMMONIA      Component Value Range   Ammonia 29  11 - 60 umol/L   0120 Spoke at length with wife who states patient is in pain management for back and neck pain. He also has significant gastroparesis she thinks contribute to the episodes of being unresponsive.  0530:  T/C to Dr. Orvan Falconer, hospitalist, case discussed, including:  HPI, pertinent PM/SHx, VS/PE, dx testing, ED course and treatment.  Agreeable to admission. He will see the patient inn the ER to facilitate transfer to [facility].   MDM  Patient presents with [facility] similar to previous episode this month thought to be due to polypharmacy. Labs are unremarkable. UDS is negative despite patient being on both opiates and benzodiazepines. Reason for [facility] is not clear. Review of previous records (2012) suggest he may have a seizure disorder. Current ALOC is c/w post ictal phase. CT head done 12/03/11 was without abnormalities. Admission for further evaluation  will be initiated. Spoke with Dr. Orvan Falconer, hospitalist, who will see the patient in the ER.Pt stable in ED with no significant deterioration in condition.The patient appears reasonably stabilized for admission considering the current resources, flow, and capabilities available in the ED at this time, and I doubt any other [facility] requiring further screening and/or treatment in the ED prior to admission.  MDM Reviewed: nursing note and vitals Interpretation: labs  CRITICAL CARE NOTE: Critical care time was provided for 45 minutes exclusive of separately billable procedures and treating other patients.  This was necessary to treat or prevent further deterioration of the following condition(s) ALOC which the patient had and/or had a high probability of suddenly developing. This involved direct bedside patient care, speaking with family members, review of past medical records, reviewing the results of the laboratory and diagnostic studies, consulting with other physicians, as well as  evaluating the effectiveness of the therapy instituted as described.          Nicoletta Dress. Colon Branch, MD 12/20/11 [phone]

## 2011-12-20 NOTE — ED Notes (Signed)
Pt still maintaining a similar LOC he exhibited when he first arrived. Pt is sleeping at this time, NAD noted, vitals are stable. Dr Colon Branch has been back into room and reassessed pt.

## 2011-12-20 NOTE — Progress Notes (Signed)
Pt arrived to unit from [facility] ED via [facility].  Report received from [place], [place] at [facility] ED at 0830.

## 2011-12-21 ENCOUNTER — Inpatient Hospital Stay (HOSPITAL_COMMUNITY): Payer: Medicare Other

## 2011-12-21 DIAGNOSIS — K5909 Other constipation: Secondary | ICD-10-CM | POA: Diagnosis present

## 2011-12-21 DIAGNOSIS — K3184 Gastroparesis: Secondary | ICD-10-CM | POA: Diagnosis present

## 2011-12-21 LAB — CBC
HCT: 37.5 % — ABNORMAL LOW (ref 39.0–52.0)
Hemoglobin: 12.5 g/dL — ABNORMAL LOW (ref 13.0–17.0)
MCHC: 33.3 g/dL (ref 30.0–36.0)
RDW: 13.7 % (ref 11.5–15.5)
WBC: 8 10*3/uL (ref 4.0–10.5)

## 2011-12-21 LAB — BASIC METABOLIC PANEL
Chloride: 103 mEq/L (ref 96–112)
GFR calc Af Amer: 50 mL/min — ABNORMAL LOW (ref >90)
GFR calc non Af Amer: 43 mL/min — ABNORMAL LOW (ref >90)
Potassium: 3.9 mEq/L (ref 3.5–5.1)

## 2011-12-21 LAB — GLUCOSE, CAPILLARY
Glucose-Capillary: 177 mg/dL — ABNORMAL HIGH (ref 70–99)
Glucose-Capillary: 254 mg/dL — ABNORMAL HIGH (ref 70–99)
Glucose-Capillary: 129 mg/dL — ABNORMAL HIGH (ref 70–99)

## 2011-12-21 MED ORDER — ENSURE COMPLETE PO LIQD
237.0000 mL | Freq: Three times a day (TID) | ORAL | Status: DC
  Administered 2011-12-21 – 2011-12-23 (×8): 237 mL via ORAL

## 2011-12-21 MED ORDER — HYDROXYZINE HCL 10 MG PO TABS
10.0000 mg | ORAL_TABLET | Freq: Three times a day (TID) | ORAL | Status: DC | PRN
  Filled 2011-12-21: qty 1

## 2011-12-21 MED ORDER — INSULIN NPH (HUMAN) (ISOPHANE) 100 UNIT/ML ~~LOC~~ SUSP
6.0000 [IU] | Freq: Two times a day (BID) | SUBCUTANEOUS | Status: DC
  Administered 2011-12-21 – 2011-12-24 (×6): 6 [IU] via SUBCUTANEOUS
  Filled 2011-12-21 (×2): qty 10

## 2011-12-21 MED ORDER — ENOXAPARIN SODIUM 40 MG/0.4ML ~~LOC~~ SOLN
40.0000 mg | Freq: Every day | SUBCUTANEOUS | Status: DC
  Administered 2011-12-21 – 2011-12-24 (×4): 40 mg via SUBCUTANEOUS
  Filled 2011-12-21 (×4): qty 0.4

## 2011-12-21 MED ORDER — OXYCODONE HCL 5 MG PO TABS
30.0000 mg | ORAL_TABLET | Freq: Four times a day (QID) | ORAL | Status: DC | PRN
  Administered 2011-12-21 – 2011-12-24 (×13): 30 mg via ORAL
  Filled 2011-12-21 (×13): qty 6

## 2011-12-21 MED ORDER — NICOTINE 21 MG/24HR TD PT24
21.0000 mg | MEDICATED_PATCH | Freq: Every day | TRANSDERMAL | Status: DC
  Administered 2011-12-22 – 2011-12-24 (×4): 21 mg via TRANSDERMAL
  Filled 2011-12-21 (×4): qty 1

## 2011-12-21 MED ORDER — MELATONIN 10 MG PO TABS: 10.0000 mg | ORAL_TABLET | Freq: Every day | ORAL | Status: DC

## 2011-12-21 MED ORDER — CLONAZEPAM 0.5 MG PO TABS
0.5000 mg | ORAL_TABLET | Freq: Two times a day (BID) | ORAL | Status: DC
  Administered 2011-12-21: 1 mg via ORAL
  Administered 2011-12-21: 0.5 mg via ORAL
  Administered 2011-12-22: 1 mg via ORAL
  Administered 2011-12-22: 0.5 mg via ORAL
  Administered 2011-12-23 – 2011-12-24 (×3): 1 mg via ORAL
  Filled 2011-12-21 (×5): qty 2

## 2011-12-21 NOTE — Progress Notes (Signed)
INITIAL ADULT NUTRITION ASSESSMENT Date: 12/21/2011   Time: 1:48 PM  Reason for Assessment: Malnutrition Screening  INTERVENTION: 1. Ensure Complete po TID, each supplement provides 350 kcal and 13 grams of protein. 2. Strongly recommend Social Work consult given pt and wife's report of financial distress and emotional interview 3. RD to continue to follow nutrition care plan  DOCUMENTATION CODES Per approved criteria  -Severe malnutrition in the context of chronic illness    ASSESSMENT: Male 56 y.o.  Dx: Altered mental status  Hx:  Past Medical History  Diagnosis Date  . Diabetes mellitus   . Hypertension   . CHF (congestive heart failure)   . CRD (chronic renal disease)   . Arthritis   . Obesity   . Depression   . Sleep apnea   . Diabetic neuropathy   . CAD (coronary artery disease)   . Mitral regurgitation   . Esophagitis, reflux 07/07/10    MW tear, erosive reflux esophagitis, 2cm hh  . S/P colonoscopy April 2012    Rourk: torturous colon, hyperplastic polyps  . Collagen vascular disease     "myoclonic "  . Tremor   . Diverticulum   . Hiatal hernia   . GERD (gastroesophageal reflux disease)     erosive   Past Surgical History  Procedure Date  . Neck surgery     discectomy  . Hand surgery     bilateral  . Colonoscopy before 2002    [place]  . Appendectomy   . Other surgical history     bone graft-knee  . Anterior lumbar corpectomy w/ fusion     C5-6/C4-7  . Colonoscopy 08/21/2010    elongaed tortuous colon otherwise normal  . Esophagogastroduodenoscopy 07/08/10    Dr. Marny Lowenstein esophageal erosions and excoriations consistent with erosive reflux esophagitis, hiatal hernia   Related Meds:     . baclofen  10 mg Oral TID  . clonazePAM  0.5 mg Oral QHS  . clonazePAM  0.5-1 mg Oral BID  . docusate sodium  100 mg Oral QHS  . docusate sodium  200 mg Oral Daily  . DULoxetine  30 mg Oral Daily  . FLUoxetine  40 mg Oral Daily  . gabapentin  400 mg Oral  TID  . insulin aspart  0-9 Units Subcutaneous TID WC  . insulin NPH  6 Units Subcutaneous BID AC  . levetiracetam  500 mg Intravenous Q12H  . levETIRAcetam  500 mg Oral Q12H  . multivitamin with minerals  1 tablet Oral Daily  . pantoprazole  40 mg Oral Daily  . rOPINIRole  2 mg Oral QHS  . DISCONTD: insulin aspart  0-9 Units Subcutaneous Q4H  . DISCONTD: insulin NPH  10 Units Subcutaneous BID AC  . DISCONTD: Melatonin  10 mg Oral QHS  . DISCONTD: rOPINIRole  2 mg Oral QHS   Ht: 6' (182.9 cm)  Wt: 216 lb 0.8 oz (98 kg)  Ideal Wt: 80.9 kg % Ideal Wt: 121%  Wt Readings from Last 15 Encounters:  12/21/11 216 lb 0.8 oz (98 kg)  12/04/11 207 lb (93.895 kg)  06/04/11 234 lb 3.2 oz (106.232 kg)  03/11/11 258 lb 3.2 oz (117.119 kg)  02/15/11 262 lb 9.1 oz (119.1 kg)  01/27/11 263 lb (119.296 kg)  12/09/10 294 lb 9.6 oz (133.63 kg)  07/24/10 306 lb (138.801 kg)  12/10/08 298 lb (135.172 kg)  10/31/08 297 lb 8 oz (134.945 kg)  09/04/08 294 lb 8 oz (133.584 kg)  07/31/08 295 lb (  133.811 kg)  06/04/08 295 lb (133.811 kg)  03/01/08 295 lb 8 oz (134.038 kg)  01/16/08 278 lb (126.1 kg)  Usual Wt: 295 - 300 lb (1 year ago) % Usual Wt: 73%  Body mass index is 29.30 kg/(m^2). Pt is overweight.  Food/Nutrition Related Hx: nausea and constipation x 1.5 year  Labs:  CMP     Component Value Date/Time   NA 140 12/21/2011 0500   K 3.9 12/21/2011 0500   CL 103 12/21/2011 0500   CO2 28 12/21/2011 0500   GLUCOSE 115* 12/21/2011 0500   BUN 25* 12/21/2011 0500   CREATININE 1.70* 12/21/2011 0500   CREATININE 2.0 06/26/2011 1006   CALCIUM 9.0 12/21/2011 0500   PROT 6.1 12/20/2011 1027   ALBUMIN 3.4* 12/20/2011 1027   AST 13 12/20/2011 1027   AST 12 06/26/2011 1006   ALT 7 12/20/2011 1027   ALKPHOS 74 12/20/2011 1027   ALKPHOS 78 06/26/2011 1006   BILITOT 0.3 12/20/2011 1027   GFRNONAA 43* 12/21/2011 0500   GFRAA 50* 12/21/2011 0500   CBG (last 3)   Basename 12/21/11 1137 12/21/11 0826 12/20/11 2123    GLUCAP 177* 119* 173*    Lab Results  Component Value Date   HGBA1C 7.4* 12/04/2011    Intake/Output Summary (Last 24 hours) at 12/21/11 1351 Last data filed at 12/21/11 1200  Gross per 24 hour  Intake   1310 ml  Output   3825 ml  Net  -2515 ml   Diet Order: Carb Control Medium (1600 - 2000)  Supplements/Tube Feeding: none  IVF:    sodium chloride Last Rate: 1,000 mL (12/21/11 1124)  DISCONTD: 0.9 % NaCl with KCl 20 mEq / L Last Rate: 1,000 mL (12/20/11 1031)   Estimated Nutritional Needs:   Kcal: 2000 - 2200 kcal  Protein:  75 - 85 grams Fluid:  2 liters daily  Admitted with AMS from unknown etiology.  Pt with recent significant wt loss within the past year. Pt with 27% wt loss x 1 year, from 300 lb to current weight of 216 lb. Noted per history, pt with issues of intractable nausea vomiting and states was diagnosed with gastroparesis and ? Prior pyloric or duodenal ulcer. Both wife and patient also described issues with terrible colonic transit time and describes this as "megacolon". State patient will not have a bowel movement over several days and then one once has bowel movement is very large.  Wife and patient state that they are in financial distress. They are able to afford groceries but often rely on financial aid programs such as the [employer]. Pt states that his appetite and intake fluctuate with how well his nausea, vomiting, and constipation is controlled.  Pt likes Ensure but is unable to afford it. Provided coupons for Ensure and Glucerna when d/c.  Wife also reports that she has had unintentional wt loss as well and she thinks something is in the water supply, however she is unable to afford the test to verify this.  Pt meets criteria for severe malnutrition in the context of chronic illness as evidenced by <75 of estimated energy intake x at least 1 month, >20% wt loss x 1 year, and likely muscle and/or fat mass loss.  NUTRITION DIAGNOSIS: -Altered GI  function (NI-1.4).  Status: Ongoing  RELATED TO: constipation and vomiting  AS EVIDENCE BY: pt report and recent wt loss  MONITORING/EVALUATION(Goals): Goal: Pt to meet >/= 90% of their estimated nutrition needs Monitor: weights, labs, PO  intake, I/O's  EDUCATION NEEDS: -No education needs identified at this time   Jarold Motto MS, RD, LDN Pager: [phone] After-hours pager: [phone]

## 2011-12-21 NOTE — Progress Notes (Signed)
Pt transferred to 4N via w/c, IV.  Wife with pt.

## 2011-12-21 NOTE — Progress Notes (Signed)
[facility] Progress Note [name]   [name] NWG:[id number] DOB: 06/10/1955 DOA: 12/20/2011 PCP: Cristie Hem, MD  Brief narrative: 56 y.o. male with medical problems including diabetes hypertension and an unspecified myoclonic disorder being treated by a neurologist in [place], (?Dr. Garth Schlatter, MD) who presents with his third episode of sudden unexplained mental status change.  The history was taken by phone from his wife on 12/20/2011, and was very difficult because she self-reports personal neurological problems making it difficult for her "to concentrate". She reports the patient's symptom first happened about 4 weeks ago when the pt suddenly became glassy-eyed, and then lethargic and difficult to arouse, but soon came back to his normal mental state. He did not get medical attention at that time. The second episode resulted in a prolonged lethargic state and he was admitted to [facility] on December 03, 2011 and kept overnight. No clear cause was identified.  The night prior to his admission the patient again suddenly became glassy eyed and unresponsive and then fell into a sleeplike state. There was no associated seizure-like activity, there was no history of trauma. EMS was called and patient apparently arrived to the emergency room within 45 minutes. He was not hypoglycemic.  There was some concern that he may have taken too much oxycodone, and he was given Narcan 2 mg by EMS, without significant response. Urine drug testing in the emergency room was negative, as it was in his previous admission. Patient's medication list includes not on the oxycodone and Klonopin, but also trazodone, Cymbalta, and Keppra. There is no history of alcohol use.  After 5 hours in the emergency room, patient has shown no improvement in his mental, appears to be in an on unrousable sleep, or prolonged post ictal state, and hospitalist service was called for  assistance.  Assessment/Plan:  Altered mental status - episodic/transient *Concern may have atypical presentation of seizures *EEG pending *Have asked for records from patient's neurologist in [facility] attempt to resume home medications at usual dosages to see if symptoms recur *Once transferred have requested camera room  Diabetes mellitus *Hemoglobin A1c 7.4 earlier this month *Continue sliding scale as well as NPH insulin  Hypertension *Blood pressure moderately controlled but likely been influenced by inadequate pain control *Was primarily on diuretics prior to admission (When necessary Lasix for lower extremity edema but scheduled and daily Aldactone hypertension management) *Patient had previously been on multiple antihypertensive medications but endorsed a significant weight loss over the past few years and likely the reason for discontinuing these medications  Chronic kidney disease stage III *Baseline creatinine appears to be approximately 1.54 *Suspect diuretics patient takes prior to admission contributing to renal issues and other problems-see below *Continue to hold diuretics  Gastroparesis with associated chronic constipation *Patient gives history over the past one year of having issues with intractable nausea vomiting and states was diagnosed with gastroparesis as well as apparent pyloric stenosis during the hospital stay at [facility] (? Prior pyloric or duodenal ulcer) *Both wife and patient also described issues with terrible colonic transit time and describes this as "megacolon". State patient will not have a bowel movement over several days and then one once has bowel movement is very large *Given issues with gastric and colonic transit abnormalities patient may be experiencing altered mentation related to medication bezoar with subsequent rapid absorption of retained medications once he has had a bowel movement and transit time of the gut is temporarily  improve *Continue MiraLax  Obesity *Endorses has lost 200 pounds over the past year-reports weight loss is unexplained. Wife also endorses "unexplained" 100 pound weight loss over the past year as well  Sleep apnea *Previous admissions document utilization of CPAP  Tobacco abuse  Chronic neck and back pain *We will resume patient's usual home medications and at home dosages *At the patient's request I have contacted Dr. Nilsa Nutting with the [facility] chronic pain clinic [phone]) to make the clinic aware of the patient's admission to the hospital.  Code Status: Full Disposition Plan: Transfer to regular floor but in room with 24 hour camera monitoring  Consultants: None  Procedures: None  Antibiotics: None  DVT prophylaxis: Lovenox  HPI/Subjective: Patient is alert and primarily endorsing back pain. Wife is at the bedside. Dr. Sharon Seller had an EXTENSIVE conversation with both patient and wife regarding past medical history for patient as well as for wife and how these at least in the patient and wife's opinion are contributing to patient's current health status. Apparently has not had any further altered mentation episodes since arrival to the hospital. They are agreeable to obtaining records from patient's primary neurologist in [place]. They also asked me contact the pain clinic to notify of hospitalization so as not to void the current pain clinic contract in place.   Objective: Blood pressure 151/62, pulse 55, temperature 98.5 F (36.9 C), temperature source Oral, resp. rate 13, height 6' (1.829 m), weight 98 kg (216 lb 0.8 oz), SpO2 94.00%.  Intake/Output Summary (Last 24 hours) at 12/21/11 1130 Last data filed at 12/21/11 1100  Gross per 24 hour  Intake   1840 ml  Output   4925 ml  Net  -3085 ml    Exam: General: No acute respiratory distress - alert and oriented x4 - no focal neurologic deficits Lungs: Clear to auscultation bilaterally without wheezes or  crackles Cardiovascular: Regular rate and rhythm without murmur gallop or rub normal S1 and S2 Abdomen: Nontender, nondistended, soft, bowel sounds positive, no rebound, no ascites, no appreciable mass Extremities: No significant cyanosis, clubbing, or edema bilateral lower extremities  Data Reviewed: Basic Metabolic Panel:  Lab 12/21/11 6578 12/20/11 0128  NA 140 138  K 3.9 4.2  CL 103 102  CO2 28 29  GLUCOSE 115* 122*  BUN 25* 24*  CREATININE 1.70* 2.16*  CALCIUM 9.0 9.6  MG -- --  PHOS -- --    Lab 12/20/11 0314  AMMONIA 29   CBC:  Lab 12/21/11 0500 12/20/11 0128  WBC 8.0 6.9  NEUTROABS -- --  HGB 12.5* 12.7*  HCT 37.5* 38.5*  MCV 86.4 87.3  PLT 194 204   CBG:  Lab 12/21/11 0826 12/20/11 2123 12/20/11 1720 12/20/11 1202 12/20/11 0952  GLUCAP 119* 173* 192* 134* 123*   Studies:  Recent x-ray studies have been reviewed in detail by the Attending Physician  Scheduled Meds:  Reviewed in detail by the Attending Physician   Junious Silk, ANP [facility] Office  [phone] Pager [phone]  On-Call/Text Page:      [email or web address]      password [email or web address]  If 7PM-7AM, please contact night-coverage [email or web address] Password [email or web address] 12/21/2011, 11:30 AM   LOS: 1 day   I have personally examined this patient and reviewed the entire database. I have reviewed the above note, made any necessary editorial changes, and agree with its content.  Lonia Blood, MD [facility]

## 2011-12-21 NOTE — Progress Notes (Signed)
Utilization Review Completed.  Safi Culotta T  12/21/2011  

## 2011-12-21 NOTE — Progress Notes (Signed)
Paged Dr. Sharon Seller regarding pt wanting to use a heating pad, as well as addressed VTE prophylaxis.  K-pad and lovenox was ordered.  Salomon Mast, RN

## 2011-12-21 NOTE — Progress Notes (Signed)
Pt CBG 119 this AM, so received no SSI per parameters.  Pt also has 10units NPH due at 0800.  Paged Dr. Sharon Seller in regards to this.  Ok to give NPH as ordered, he will change it to 6 units for dose due at 1700.    Salomon Mast, RN

## 2011-12-21 NOTE — Progress Notes (Signed)

## 2011-12-21 NOTE — Progress Notes (Signed)
Report called to Riley Lam, RN on 4N.

## 2011-12-21 NOTE — Progress Notes (Signed)
Portable EEG completed

## 2011-12-22 DIAGNOSIS — R4182 Altered mental status, unspecified: Secondary | ICD-10-CM

## 2011-12-22 DIAGNOSIS — I509 Heart failure, unspecified: Secondary | ICD-10-CM

## 2011-12-22 DIAGNOSIS — K5900 Constipation, unspecified: Secondary | ICD-10-CM

## 2011-12-22 LAB — BASIC METABOLIC PANEL
CO2: 30 mEq/L (ref 19–32)
Chloride: 105 mEq/L (ref 96–112)
Creatinine, Ser: 1.61 mg/dL — ABNORMAL HIGH (ref 0.50–1.35)
Glucose, Bld: 147 mg/dL — ABNORMAL HIGH (ref 70–99)
Sodium: 143 mEq/L (ref 135–145)

## 2011-12-22 LAB — GLUCOSE, CAPILLARY: Glucose-Capillary: 136 mg/dL — ABNORMAL HIGH (ref 70–99)

## 2011-12-22 NOTE — Progress Notes (Signed)
Subjective: Patient awake, no more episode of staring spell, or lethargic.   Objective: Filed Vitals:   12/22/11 0236 12/22/11 0548 12/22/11 1000 12/22/11 1400  BP: 148/86 149/74 154/69 151/76  Pulse: 61 57 55 52  Temp: 97.7 F (36.5 C) 97.6 F (36.4 C) 98.1 F (36.7 C) 97.7 F (36.5 C)  TempSrc: Oral Oral Oral Oral  Resp: 17 18 18 17   Height:      Weight:  95.255 kg (210 lb)    SpO2: 94% 93% 97% 97%   Weight change: -0.845 kg (-1 lb 13.8 oz)   General: Alert, awake, oriented x3, in no acute distress.  HEENT: No bruits, no goiter.  Heart: Regular rate and rhythm, without murmurs, rubs, gallops.  Lungs: CTA, bilateral air movement.  Abdomen: Soft, nontender, nondistended, positive bowel sounds.  Extremities; no edema.    Lab Results:  Basename 12/22/11 0728 12/21/11 0500  NA 143 140  K 3.9 3.9  CL 105 103  CO2 30 28  GLUCOSE 147* 115*  BUN 25* 25*  CREATININE 1.61* 1.70*  CALCIUM 8.9 9.0  MG -- --  PHOS -- --    Basename 12/20/11 1027  AST 13  ALT 7  ALKPHOS 74  BILITOT 0.3  PROT 6.1  ALBUMIN 3.4*    Basename 12/21/11 0500 12/20/11 0128  WBC 8.0 6.9  NEUTROABS -- --  HGB 12.5* 12.7*  HCT 37.5* 38.5*  MCV 86.4 87.3  PLT 194 204   Micro Results: Recent Results (from the past 240 hour(s))  MRSA PCR SCREENING     Status: Normal   Collection Time   12/20/11  9:31 AM      Component Value Range Status Comment   MRSA by PCR NEGATIVE  NEGATIVE Final     Studies/Results: No results found.  Medications: I have reviewed the patient's current medications.  Brief narrative:  56 y.o. male with medical problems including diabetes hypertension and an unspecified myoclonic disorder being treated by a neurologist in [place], (?Dr. Garth Schlatter, MD) who presents with his third episode of sudden unexplained mental status change.  The history was taken by phone from his wife on 12/20/2011, and was very difficult because she self-reports personal neurological  problems making it difficult for her "to concentrate". She reports the patient's symptom first happened about 4 weeks ago when the pt suddenly became glassy-eyed, and then lethargic and difficult to arouse, but soon came back to his normal mental state. He did not get medical attention at that time. The second episode resulted in a prolonged lethargic state and he was admitted to [facility] on December 03, 2011 and kept overnight. No clear cause was identified.  The night prior to his admission the patient again suddenly became glassy eyed and unresponsive and then fell into a sleeplike state. There was no associated seizure-like activity, there was no history of trauma. EMS was called and patient apparently arrived to the emergency room within 45 minutes. He was not hypoglycemic.  There was some concern that he may have taken too much oxycodone, and he was given Narcan 2 mg by EMS, without significant response. Urine drug testing in the emergency room was negative, as it was in his previous admission. Patient's medication list includes not on the oxycodone and Klonopin, but also trazodone, Cymbalta, and Keppra. There is no history of alcohol use.  After 5 hours in the emergency room, patient has shown no improvement in his mental, appears to be in an on unrousable  sleep, or prolonged post ictal state, and hospitalist service was called for assistance.   Assessment/Plan:   Altered mental status - episodic/transient  *Concern may have atypical presentation of seizures or probably related to medications.  *EEG: This is a normal EEG recording during wakefulness and brief sleep. No evidence of an epileptic disorder was demonstrated. *I Have asked staff today 8-27 to get  records from patient's neurologist in [facility]  *He has been tolerating current home doses of medications.    Diabetes mellitus  *Hemoglobin A1c 7.4 earlier this month  *Continue sliding scale as well as NPH insulin    Hypertension  *Blood pressure moderately controlled  *Was primarily on diuretics prior to admission (When necessary Lasix for lower extremity edema but scheduled and daily Aldactone hypertension management)  Holding aldactone due to renal failure.   Chronic kidney disease stage III  *Baseline creatinine appears to be approximately 1.54  *Suspect diuretics patient takes prior to admission contributing to renal issues and other problems-see below  *Continue to hold diuretics  *Creatinine trending down nicely. B-met am.   Gastroparesis with associated chronic constipation  *Patient gives history over the past one year of having issues with intractable nausea vomiting and states was diagnosed with gastroparesis as well as apparent pyloric stenosis during the hospital stay at [facility] (? Prior pyloric or duodenal ulcer)  *Both wife and patient also described issues with terrible colonic transit time and describes this as "megacolon". State patient will not have a bowel movement over several days and then one once has bowel movement is very large  *Given issues with gastric and colonic transit abnormalities patient may be experiencing altered mentation related to medication bezoar with subsequent rapid absorption of retained medications once he has had a bowel movement and transit time of the gut is temporarily improve  *Continue MiraLax   Obesity  *Endorses has lost 200 pounds over the past year-reports weight loss is unexplained. Wife also endorses "unexplained" 100 pound weight loss over the past year as well. Needs to follow up with PCP for further work up.   Sleep apnea  *Previous admissions document utilization of CPAP   Tobacco abuse   Chronic neck and back pain  *We will resume patient's usual home medications and at home dosages  *At the patient's request I have contacted Dr. Nilsa Nutting with the [facility] chronic pain clinic [phone]) to make the clinic aware of the patient's admission to  the hospital.   Code Status: Full   Disposition Plan: Transfer to regular floor but in room with 24 hour camera monitoring   Consultants:  None  Procedures:  None  Antibiotics:  None  DVT prophylaxis:  Lovenox      LOS: 2 days   [name] M.D.  [facility] 12/22/2011, 3:21 PM

## 2011-12-22 NOTE — Procedures (Signed)
EEG NUMBER:  [id number].  INDICATIONS FOR STUDY:  A 56 year old man with recurrent episodes of unexplained lethargy and altered mental status.  He also has a history of mild chronic disorder in addition to diabetes mellitus, hypertension, and obstructive sleep apnea.  Study is being performed to rule out signs of possible seizure disorder.  DESCRIPTION:  This is a routine EEG recording performed during wakefulness and during brief periods of light sleep.  Predominant background activity during wakefulness consisted of 9 Hz symmetrical alpha rhythm which attenuated well with eye opening.  Photic stimulation produced a symmetrical occipital driving response.  Hyperventilation was not performed.  During brief periods of sleep, there was slowing of background activity symmetrically with mixed irregular delta and theta activity as well as sleep spindles and arousal responses which were symmetrical.  No epileptiform discharges were recorded.  There were no areas of disproportionate focal slowing.  INTERPRETATION:  This is a normal EEG recording during wakefulness and brief sleep.  No evidence of an epileptic disorder was demonstrated.     Noel Christmas, MD    AV:WUJW D:  12/21/2011 14:29:38  T:  12/22/2011 02:37:14  Job #:  [id number]

## 2011-12-22 NOTE — Progress Notes (Signed)
Couldn't find copy of medical records. To get a copy of his old medical records, contacted the number which the patient gave to us(Dr.Haq- [phone], neurologist at [facility]).  From their we have been directed to the medical records department at [facility], they couldn't find his records in the system.

## 2011-12-22 NOTE — Clinical Social Work Psychosocial (Signed)
     Clinical Social Work Department BRIEF PSYCHOSOCIAL ASSESSMENT 12/22/2011  Patient:  [name], [name]     Account Number:  [id number]     Admit date:  12/20/2011  Clinical Social Worker:  Margaree Mackintosh  Date/Time:  12/22/2011 09:15 AM  Referred by:  Physician  Date Referred:  12/21/2011 Referred for  Other - See comment   Other Referral:   "other psychosocial needs".   Interview type:  Patient Other interview type:    PSYCHOSOCIAL DATA Living Status:  FAMILY Admitted from facility:   Level of care:   Primary support name:  Arline Asp: [phone] Primary support relationship to patient:  SPOUSE Degree of support available:   Unknown.    CURRENT CONCERNS Current Concerns  Financial Resources  Other - See comment   Other Concerns:   Pt requesting additional assistance at home.    SOCIAL WORK ASSESSMENT / PLAN Clinical Social Worker recieved referral for "other psychosocial needs". CSW reviewed chart and met with RN and NT.  CSW met with pt at bedside.  CSW introduced self, explained role, and provided support.  CSW provided opportunity for pt to express concerns related to this hospitalization. Pt appeared to speak openly and honestly. Pt expressed worry/concerns related to hospital bill and requiring additional assistance at home.  Per pt, he and wife receive disability and have very limited financial support.  Pt reports he is main caregiver for wife.  When asked who is assisting wife during this hospitalization his dtr is "watching" his wife in dtr's home.  Pt would like Home Health and Personal Care Services to assist with activities of daily living.  CSW reviewed process for establishing PCS and HH services.  CSW to notify RNCM.  Pt stated, "My wife will probably have more concerns to discuss when she gets here".  CSW encouraged pt to feel comfortable with addressing these concerns with medical staff. CSW to notify Unit CSW.  CSW to continue to follow.    Assessment/plan status:  Information/Referral to [employer] Other assessment/ plan:   Information/referral to community resources:   Surveyor, quantity Resources: [employer].  HH and PCS.    PATIENTS/FAMILYS RESPONSE TO PLAN OF CARE: Pt was sitting upright in bed.  Pt was pleasant and appropriately engaged.  Pt appeared to speak openly and honestly.  Pt thanked CSW for intervention.

## 2011-12-23 DIAGNOSIS — I10 Essential (primary) hypertension: Secondary | ICD-10-CM

## 2011-12-23 DIAGNOSIS — G40309 Generalized idiopathic epilepsy and epileptic syndromes, not intractable, without status epilepticus: Secondary | ICD-10-CM

## 2011-12-23 DIAGNOSIS — D649 Anemia, unspecified: Secondary | ICD-10-CM

## 2011-12-23 DIAGNOSIS — K3184 Gastroparesis: Secondary | ICD-10-CM

## 2011-12-23 LAB — BASIC METABOLIC PANEL
BUN: 28 mg/dL — ABNORMAL HIGH (ref 6–23)
CO2: 28 mEq/L (ref 19–32)
Chloride: 104 mEq/L (ref 96–112)
Creatinine, Ser: 1.6 mg/dL — ABNORMAL HIGH (ref 0.50–1.35)
Glucose, Bld: 109 mg/dL — ABNORMAL HIGH (ref 70–99)

## 2011-12-23 LAB — GLUCOSE, CAPILLARY
Glucose-Capillary: 113 mg/dL — ABNORMAL HIGH (ref 70–99)
Glucose-Capillary: 227 mg/dL — ABNORMAL HIGH (ref 70–99)
Glucose-Capillary: 178 mg/dL — ABNORMAL HIGH (ref 70–99)

## 2011-12-23 MED ORDER — BACLOFEN 5 MG HALF TABLET
5.0000 mg | ORAL_TABLET | Freq: Three times a day (TID) | ORAL | Status: DC
  Administered 2011-12-23 – 2011-12-24 (×4): 5 mg via ORAL
  Filled 2011-12-23 (×5): qty 1

## 2011-12-23 MED ORDER — MODAFINIL 200 MG PO TABS
100.0000 mg | ORAL_TABLET | Freq: Every day | ORAL | Status: DC
  Administered 2011-12-23 – 2011-12-24 (×2): 100 mg via ORAL
  Filled 2011-12-23 (×2): qty 1

## 2011-12-23 MED ORDER — MORPHINE SULFATE 4 MG/ML IJ SOLN
4.0000 mg | Freq: Once | INTRAMUSCULAR | Status: AC
  Administered 2011-12-23: 4 mg via INTRAVENOUS
  Filled 2011-12-23: qty 1

## 2011-12-23 NOTE — Progress Notes (Signed)
[facility] on call notified that pt pain is not controlled with pain medications that are ordered. New orders received will continue to monitor. Gay Filler

## 2011-12-23 NOTE — Consult Note (Signed)
[facility] CONSULT NOTE     Reason for Consult: staring spells    HPI:   History provided by both patient, wife and noted from [facility]. Wife is extremely hostile during the examination.   Prior to discussion about patient she expressed her frustration with previous other healthcare providers and frustration with health system. She also has raised a concern about him not driving for 6 months.   [name] is an 56 y.o. male with unspecified myoclonic disorder  And was diagnosed with restless leg syndrome and is being treated by a neurologist in [place], (Dr. Garth Schlatter, MD).  Patient has been seeing Dr. Zola Button for a while but only for his nocturnal myoclonic jerking.  He has Dr. Christella Hartigan in past but patients wife was unpleased with care and hs not returned.  Wife is in room, very anxious and intense with her description of events.  She states over the past year he has not been wearing his CPAP although he continues to snore and often does not sleep well. Patient describes on a daily basis he feels very tired and feels he did not have a good nights sleep. His wife feels these symptoms are not associated with his sleep apnea.  She feels there are other episodes where he will "have a glazed look on his face and then be unresponsive for 20-30 minutes".  He has never had tonic-clonic activity, urinary incontinence, and possibly tongue biting. Patient feels these episodes often occur after staring at a computer screen, when tired or reading fine print.    Additionally, she  Indicates that patient does not need Trazadone, Neurontin, Melatonin, Oxycodone for sleep.  She describes that when patient wakes up in the morning he is lethargic and falls asleep at the breakfast table, or while watching TV or any other activity.  She was told by an ER physician at another facility that  He may be having seizures and that is how she is fixated on seizures. Patient has been scheduled  to go for surgery for chronic back pain. Patient has been in hospital he has been placed on Keppra 500 mg BID and has had no further episodes.  Patient is diabetic and does not check BG at home. Hemoglobin A1C was 7.8  12/03/2011  Patient's home medication includes multiple sedating Rx :Baclofen, Clonazepam, Neurontin, Melatonin, Roxicodone, Requipe and trazodone.  EEG 01/2011 report from Dr. Treasa School " IMPRESSION: Slightly abnormal recording showing a couple of  epileptiform discharges involving the left temporal area. No electrographic seizures"  EEG obtained while hospitalized was normal.   Past Medical History  Diagnosis Date  . Diabetes mellitus   . Hypertension   . CHF (congestive heart failure)   . CRD (chronic renal disease)   . Arthritis   . Obesity   . Depression   . Sleep apnea   . Diabetic neuropathy   . CAD (coronary artery disease)   . Mitral regurgitation   . Esophagitis, reflux 07/07/10    MW tear, erosive reflux esophagitis, 2cm hh  . S/P colonoscopy April 2012    [place]: torturous colon, hyperplastic polyps  . Collagen vascular disease     "myoclonic "  . Tremor   . Diverticulum   . Hiatal hernia   . GERD (gastroesophageal reflux disease)     erosive    Past Surgical History  Procedure Date  .  Neck surgery     discectomy  . Hand surgery     bilateral  . Colonoscopy before 2002    [place]  . Appendectomy   . Other surgical history     bone graft-knee  . Anterior lumbar corpectomy w/ fusion     C5-6/C4-7  . Colonoscopy 08/21/2010    elongaed tortuous colon otherwise normal  . Esophagogastroduodenoscopy 07/08/10    Dr. Marny Lowenstein esophageal erosions and excoriations consistent with erosive reflux esophagitis, hiatal hernia    Family History  Problem Relation Age of Onset  . Stroke Father 75  . Stroke Mother 58  . Colon cancer Neg Hx   . Crohn's disease Neg Hx   . Cirrhosis Neg Hx   . Ulcerative colitis Neg Hx   . Stomach cancer Neg Hx     Social  History:  reports that he has been smoking Cigarettes.  He has a 20 pack-year smoking history. He has never used smokeless tobacco. He reports that he does not drink alcohol or use illicit drugs.  No Known Allergies    Medications:    Prior to Admission:  Prescriptions prior to admission  Medication Sig Dispense Refill  . baclofen (LIORESAL) 10 MG tablet Take 10 mg by mouth 3 (three) times daily.      . clonazePAM (KLONOPIN) 0.5 MG tablet Take 0.5-1 mg by mouth 2 (two) times daily. 0.5 mg in the a.m. And 1 mg at night      . docusate sodium (COLACE) 100 MG capsule Take 100-200 mg by mouth 2 (two) times daily. Take 2 tablets every morning and 1 tablet at bedtime      . DULoxetine (CYMBALTA) 30 MG capsule Take 30 mg by mouth daily.      . furosemide (LASIX) 20 MG tablet Take 60 mg by mouth daily.      . furosemide (LASIX) 40 MG tablet Take 0.5 tablets (20 mg total) by mouth as needed. Fluid retention  30 tablet  0  . gabapentin (NEURONTIN) 400 MG capsule Take 400 mg by mouth 3 (three) times daily.      . hydrOXYzine (ATARAX/VISTARIL) 10 MG tablet Take 10 mg by mouth 3 (three) times daily as needed. For itching      . insulin NPH-insulin regular (NOVOLIN 70/30) (70-30) 100 UNIT/ML injection Inject 15 Units into the skin 2 (two) times daily with a meal.  10 mL    . levETIRAcetam (KEPPRA) 500 MG tablet Take 500 mg by mouth every 12 (twelve) hours.      . Melatonin 10 MG TABS Take 10 mg by mouth at bedtime.      . Multiple Vitamins-Minerals (CENTRUM SILVER ULTRA MENS) TABS Take 1 tablet by mouth daily.        Marland Kitchen oxycodone (ROXICODONE) 30 MG immediate release tablet Take 30 mg by mouth every 6 (six) hours as needed.      . pantoprazole (PROTONIX) 40 MG tablet Take 40 mg by mouth daily.      . polyethylene glycol (MIRALAX / GLYCOLAX) packet Take 17 g by mouth daily.      Marland Kitchen rOPINIRole (REQUIP) 2 MG tablet Take 2 mg by mouth at bedtime.      Marland Kitchen spironolactone (ALDACTONE) 25 MG tablet Take 25 mg by  mouth at bedtime.       . traZODone (DESYREL) 100 MG tablet Take 50 mg by mouth at bedtime.       Scheduled:   . baclofen  10 mg Oral TID  .  clonazePAM  0.5-1 mg Oral BID  . docusate sodium  100 mg Oral QHS  . docusate sodium  200 mg Oral Daily  . DULoxetine  30 mg Oral Daily  . enoxaparin (LOVENOX) injection  40 mg Subcutaneous Daily  . feeding supplement  237 mL Oral TID BM  . FLUoxetine  40 mg Oral Daily  . gabapentin  400 mg Oral TID  . insulin aspart  0-9 Units Subcutaneous TID WC  . insulin NPH  6 Units Subcutaneous BID AC  . levETIRAcetam  500 mg Oral Q12H  .  morphine injection  4 mg Intravenous Once  . multivitamin with minerals  1 tablet Oral Daily  . nicotine  21 mg Transdermal Daily  . pantoprazole  40 mg Oral Daily  . rOPINIRole  2 mg Oral QHS  . DISCONTD: clonazePAM  0.5 mg Oral QHS    Review of Systems - General ROS: negative for - chills, fatigue, fever or hot flashes Hematological and Lymphatic ROS: negative for - bruising, fatigue, jaundice or pallor Endocrine ROS: negative for - hair pattern changes, hot flashes, mood swings or skin changes Respiratory ROS: negative for - cough, hemoptysis, orthopnea or wheezing Cardiovascular ROS: negative for - dyspnea on exertion, orthopnea, palpitations or shortness of breath Gastrointestinal ROS: negative for - abdominal pain, appetite loss, blood in stools, diarrhea or hematemesis Musculoskeletal ROS: negative for - joint pain, joint stiffness, joint swelling or muscle pain Neurological ROS: positive for - confusion Dermatological ROS: negative for dry skin, pruritus and rash   Blood pressure 135/67, pulse 59, temperature 98.1 F (36.7 C), temperature source Oral, resp. rate 17, height 6' (1.829 m), weight 95.255 kg (210 lb), SpO2 95.00%.   Neurologic Examination:   Mental Status: Alert, oriented X 3.  Speech fluent without evidence of aphasia. Able to follow 3 step commands without difficulty. Mood:  pleasant Memory:intact Thought content appropriate Cranial Nerves: II-Visual fields grossly intact. III/IV/VI-Extraocular movements intact.  Pupils reactive bilaterally. Ptosis not present. V/VII-Smile symmetric VIII-grossly intact IX/X-normal gag XI-bilateral shoulder shrug XII-midline tongue extension Motor: 5/5 bilaterally with normal tone and bulk Sensory: Pinprick and light touch intact throughout, bilaterally decreased in stocking distribution but has known neuropathy.  Deep Tendon Reflexes:  Right: Upper Extremity   Left: Upper extremity   biceps (C-5 to C-6) 2/4   biceps (C-5 to C-6) 2/4 tricep (C7) 2/4    triceps (C7) 2/4 Brachioradialis (C6) 2/4  Brachioradialis (C6) 2/4  Lower Extremity Lower Extremity  quadriceps (L-2 to L-4) 2/4   quadriceps (L-2 to L-4) 2/4 Achilles (S1) 1/4   Achilles (S1) 1/4      Plantars:      Right:  downgoing     Left:  Downgoing Cerebellar: Normal finger-to-nose,  normal heel-to-shin test.     Lab Results  Component Value Date/Time   CHOL  Value: 193        ATP III CLASSIFICATION:  <200     mg/dL   Desirable  161-096  mg/dL   Borderline High  >=045    mg/dL   High        4/0/9811 11:28 PM    Results for orders placed during the hospital encounter of 12/20/11 (from the past 48 hour(s))  GLUCOSE, CAPILLARY     Status: Abnormal   Collection Time   12/21/11  5:03 PM      Component Value Range Comment   Glucose-Capillary 254 (*) 70 - 99 mg/dL   GLUCOSE, CAPILLARY  Status: Abnormal   Collection Time   12/21/11 10:01 PM      Component Value Range Comment   Glucose-Capillary 129 (*) 70 - 99 mg/dL    Comment 1 Documented in Chart      Comment 2 Notify RN     GLUCOSE, CAPILLARY     Status: Abnormal   Collection Time   12/22/11  7:05 AM      Component Value Range Comment   Glucose-Capillary 136 (*) 70 - 99 mg/dL    Comment 1 Documented in Chart      Comment 2 Notify RN     BASIC METABOLIC PANEL     Status: Abnormal   Collection Time    12/22/11  7:28 AM      Component Value Range Comment   Sodium 143  135 - 145 mEq/L    Potassium 3.9  3.5 - 5.1 mEq/L    Chloride 105  96 - 112 mEq/L    CO2 30  19 - 32 mEq/L    Glucose, Bld 147 (*) 70 - 99 mg/dL    BUN 25 (*) 6 - 23 mg/dL    Creatinine, Ser 1.61 (*) 0.50 - 1.35 mg/dL    Calcium 8.9  8.4 - 09.6 mg/dL    GFR calc non Af Amer 46 (*) >90 mL/min    GFR calc Af Amer 53 (*) >90 mL/min   GLUCOSE, CAPILLARY     Status: Abnormal   Collection Time   12/22/11 11:47 AM      Component Value Range Comment   Glucose-Capillary 156 (*) 70 - 99 mg/dL   GLUCOSE, CAPILLARY     Status: Abnormal   Collection Time   12/22/11  4:37 PM      Component Value Range Comment   Glucose-Capillary 148 (*) 70 - 99 mg/dL   GLUCOSE, CAPILLARY     Status: Abnormal   Collection Time   12/22/11  9:40 PM      Component Value Range Comment   Glucose-Capillary 239 (*) 70 - 99 mg/dL    Comment 1 Documented in Chart      Comment 2 Notify RN     BASIC METABOLIC PANEL     Status: Abnormal   Collection Time   12/23/11  6:30 AM      Component Value Range Comment   Sodium 141  135 - 145 mEq/L    Potassium 3.9  3.5 - 5.1 mEq/L    Chloride 104  96 - 112 mEq/L    CO2 28  19 - 32 mEq/L    Glucose, Bld 109 (*) 70 - 99 mg/dL    BUN 28 (*) 6 - 23 mg/dL    Creatinine, Ser 0.45 (*) 0.50 - 1.35 mg/dL    Calcium 8.9  8.4 - 40.9 mg/dL    GFR calc non Af Amer 46 (*) >90 mL/min    GFR calc Af Amer 54 (*) >90 mL/min   GLUCOSE, CAPILLARY     Status: Abnormal   Collection Time   12/23/11  7:02 AM      Component Value Range Comment   Glucose-Capillary 113 (*) 70 - 99 mg/dL    Comment 1 Notify RN      Comment 2 Documented in Chart     GLUCOSE, CAPILLARY     Status: Abnormal   Collection Time   12/23/11 11:41 AM      Component Value Range Comment   Glucose-Capillary 227 (*) 70 - 99 mg/dL  No results found.   Assessment/Plan:   56 YO male who has had multiple staring episodes associated with decreased alertness  post episode according to wife. Recent EEG and past MRI/CT head have been negative.  Patient is on multiple sedating medications at home and has known renal insufficiency which may be adding to his complaint of chronic somnolence, In addition patient haas known OSA which has not been reevaluated by his pulmonologist in past two years. Etiology of staring spells unclear. Patient is demanding more Oxycodone  Differential includes:  1) Narcolepsy 2) This is less likely to be a seizure, he is already on benzos and Neurontin which is an AED. 3) Parasomnia  Recommend: 1) Continue Keppra 500 mg BID and have follow up with Dr. Zola Button from [facility].  Patient may need to have EMU to further investigate staring spells.  2) limit sedating medications while in hospital 3) Follow up with Pulmonologist for CPAP and OSA 4)Patient has been advised to not drive, operate heavy machinery, perform activities at heights and participate in water activities until release by outpatient physician.   Wife has disagreed to follow this recommendations 5) Provigil 100 mg po bid 6) D/C Trazadone 7) F/U with Dr. Delila Spence PA-C [facility] [phone]  12/23/2011, 12:09 PM   I have independently reviewed the chart, examined the patient and obtained the history.  Have edited the Consult note. And have generated a plan  Thank you for the consult Will sign off  Otto Felkins V-P Eilleen Kempf., MD., Ph.D.,MS 12/23/2011 2:49 PM

## 2011-12-23 NOTE — Progress Notes (Addendum)
Patient ID: [name]  male  ZOX:[id number]    DOB: [birth year]    DOA: 12/20/2011  PCP: Cristie Hem, MD  Subjective: Complaining of chronic pain, wants his oxycodone scheduled to every 4-5 hours. No seizure or staring spells noted. No fever, chills, chest pain, shortness of breath.  Objective: Weight change:   Intake/Output Summary (Last 24 hours) at 12/23/11 1349 Last data filed at 12/23/11 1100  Gross per 24 hour  Intake   1505 ml  Output      3 ml  Net   1502 ml   Blood pressure 135/67, pulse 59, temperature 98.1 F (36.7 C), temperature source Oral, resp. rate 17, height 6' (1.829 m), weight 95.255 kg (210 lb), SpO2 95.00%.  Physical Exam: General: Alert and awake, oriented x3, not in any acute distress. HEENT: anicteric sclera, pupils reactive to light and accommodation, EOMI CVS: S1-S2 clear, no murmur rubs or gallops Chest: clear to auscultation bilaterally, no wheezing, rales or rhonchi Abdomen: soft nontender, nondistended, normal bowel sounds, no organomegaly Extremities: no cyanosis, clubbing or edema noted bilaterally Neuro: Cranial nerves II-XII intact, no focal neurological deficits  Lab Results: Basic Metabolic Panel:  Lab 12/23/11 8119 12/22/11 0728  NA 141 143  K 3.9 3.9  CL 104 105  CO2 28 30  GLUCOSE 109* 147*  BUN 28* 25*  CREATININE 1.60* 1.61*  CALCIUM 8.9 8.9  MG -- --  PHOS -- --   Liver Function Tests:  Lab 12/20/11 1027  AST 13  ALT 7  ALKPHOS 74  BILITOT 0.3  PROT 6.1  ALBUMIN 3.4*     Lab 12/20/11 0314  AMMONIA 29   CBC:  Lab 12/21/11 0500 12/20/11 0128  WBC 8.0 6.9  NEUTROABS -- --  HGB 12.5* 12.7*  HCT 37.5* 38.5*  MCV 86.4 87.3  PLT 194 204  CBG:  Lab 12/23/11 1141 12/23/11 0702 12/22/11 2140 12/22/11 1637 12/22/11 1147  GLUCAP 227* 113* 239* 148* 156*     Micro Results: Recent Results (from the past 240 hour(s))  MRSA PCR SCREENING     Status: Normal   Collection Time   12/20/11  9:31 AM      Component  Value Range Status Comment   MRSA by PCR NEGATIVE  NEGATIVE Final     Studies/Results: Ct Head Wo Contrast  12/20/2011  *RADIOLOGY REPORT*  Clinical Data: 56 year old male with altered level of consciousness and altered mental status.  CT HEAD WITHOUT CONTRAST  Technique:  Contiguous axial images were obtained from the base of the skull through the vertex without contrast.  Comparison: 12/03/2011  Findings: No intracranial abnormalities are identified, including mass lesion or mass effect, hydrocephalus, extra-axial fluid collection, midline shift, hemorrhage, or acute infarction.  The visualized bony calvarium is unremarkable.  IMPRESSION: Unremarkable noncontrast head CT.   Original Report Authenticated By: Rosendo Gros, M.D.    Ct Head Wo Contrast  12/03/2011  *RADIOLOGY REPORT*  Clinical Data: Weakness.  Altered mental status.  CT HEAD WITHOUT CONTRAST  Technique:  Contiguous axial images were obtained from the base of the skull through the vertex without contrast.  Comparison: MRI of the brain 02/16/2011.  Head CT 02/15/2011.  Findings: No acute intracranial abnormality.  Specifically, no definite signs of acute/subacute cerebral ischemia, no definite acute intracranial hemorrhage, no focal mass, mass effect, hydrocephalus or abnormal intra or extra-axial fluid collections. Visualized paranasal sinuses and mastoids are well pneumatized.  No acute displaced skull fractures are identified.  IMPRESSION: 1.  No  acute intracranial abnormalities. 2.  The appearance of the brain is normal.  Original Report Authenticated By: Florencia Reasons, M.D.   Dg Chest Port 1 View  12/20/2011  *RADIOLOGY REPORT*  Clinical Data: 56 year old male with shortness of breath.  PORTABLE CHEST - 1 VIEW  Comparison: 02/15/2011  Findings: The cardiomediastinal silhouette is unremarkable. The lungs are clear. There is no evidence of focal airspace disease, pulmonary edema, suspicious pulmonary nodule/mass, pleural effusion, or  pneumothorax. No acute bony abnormalities are identified.  IMPRESSION: No evidence of active cardiopulmonary disease.   Original Report Authenticated By: Rosendo Gros, M.D.    Dg Abd Acute W/chest  12/03/2011  *RADIOLOGY REPORT*  Clinical Data: Weakness, back pain, hypoxia  ACUTE ABDOMEN SERIES (ABDOMEN 2 VIEW & CHEST 1 VIEW)  Comparison: 07/07/2010, 02/15/2011  Findings: Mild cardiomegaly without CHF or pneumonia.  No effusion or pneumothorax.  No focal airspace disease.  Lower cervical fusion hardware evident.  No free air evident on the decubitus view.  Scattered air and stool throughout the bowel.  Negative for obstruction or ileus. Degenerative changes of the spine.  No abnormal calcifications.  IMPRESSION: No acute finding.  Negative for obstruction  Original Report Authenticated By: Judie Petit. Ruel Favors, M.D.    Medications: Scheduled Meds:   . baclofen  10 mg Oral TID  . clonazePAM  0.5-1 mg Oral BID  . docusate sodium  100 mg Oral QHS  . docusate sodium  200 mg Oral Daily  . DULoxetine  30 mg Oral Daily  . enoxaparin (LOVENOX) injection  40 mg Subcutaneous Daily  . feeding supplement  237 mL Oral TID BM  . FLUoxetine  40 mg Oral Daily  . gabapentin  400 mg Oral TID  . insulin aspart  0-9 Units Subcutaneous TID WC  . insulin NPH  6 Units Subcutaneous BID AC  . levETIRAcetam  500 mg Oral Q12H  .  morphine injection  4 mg Intravenous Once  . multivitamin with minerals  1 tablet Oral Daily  . nicotine  21 mg Transdermal Daily  . pantoprazole  40 mg Oral Daily  . rOPINIRole  2 mg Oral QHS   Continuous Infusions:   . sodium chloride 50 mL/hr at 12/23/11 1610     Assessment/Plan: Principal Problem:   Altered mental status - episodic/transient: With recurrent admissions for the same symptoms. Patient describes it more as "droopy or intensely tired" especially after some exertion.  He has been followed by Dr. Gerilyn Pilgrim, and currently in [facility], Dr. Zola Button. States that he had  myoclonic jerks and was prescribed clonazepam by Dr. Zola Button. Concern may have atypical presentation of seizures or probably related to medications. There is a possibility of narcolepsy by the description of the patient's symptoms. -  EEG done during this admission shows normal EEG recording during wakefulness and brief sleep. No evidence of an epileptic disorder was demonstrated.  - I have discussed with neurology for consultation inpatient. - There was also consulted patient may have taken too much of oxycodone, hence I will not place him on scheduled oxycodone. His medication list also includes Keppra which was continued. - Hold off on the sedating and psychotropic medications  Diabetes mellitus  - Hemoglobin A1c 7.4 earlier this month, Continue sliding scale as well as NPH insulin   Hypertension: Stable   Chronic kidney disease stage III; Baseline creatinine appears to be approximately 1.54  - Continue to hold diuretics.  Gastroparesis with gastric outlet obstruction/ associated chronic constipation  -  Given issues with gastric and colonic transit abnormalities patient may be experiencing altered mentation related to medication bezoar with subsequent rapid absorption of retained medications once he has had a bowel movement and transit time of the gut is temporarily improved. Patient is seeing Dr. Marina Goodell ([facility]) and per patient he will be seeing GI at [facility].    Obesity: Endorses has lost 200 pounds over the past year-reports weight loss is unexplained.  - Workup outpatient with PCP  Sleep apnea  - Patient admits to being noncompliant with his CPAP   Tobacco abuse   Chronic neck and back pain  - Continue patient's usual home medications and at home dosages, he follows pain clinic. Dr. Sharon Seller the contacted Dr. Nilsa Nutting with the [facility] chronic pain clinic [phone]) to make the clinic aware of the patient's admission to the hospital.   DVT Prophylaxis:  Code Status:  [facility]  Disposition:Await neurology recommendations, hopefully DC in 24 hours if no new episodes.   LOS: 3 days   Luretta Everly M.D. [facility] 12/23/2011, 1:49 PM Pager: [phone]  If 7PM-7AM, please contact night-coverage [email or web address] Password [email or web address]   Addendum: Neurology recommendations reviewed. Discussed with patient's wife in detail, will start Provigil today. Also encouraged to see Dr. Maple Hudson who is patient's pulmonologist for obstructive sleep apnea, patient is noncompliant with his CPAP. Patient's wife made him an appointment with Dr. Zola Button at [facility] neurology for Friday, 12/25/2011 for followup. DC in a.m. per patient's wife request and start modafinil today.   Keoki Mchargue M.D. [facility] 12/23/2011, 3:13 PM  Pager: [phone]

## 2011-12-23 NOTE — Care Management Note (Signed)
    Page 1 of 2   12/24/2011     1:47:06 PM   CARE MANAGEMENT NOTE 12/24/2011  Patient:  [name],[name]   Account Number:  [id number]  Date Initiated:  12/21/2011  Documentation initiated by:  Alvira Philips Assessment:   56 yr-old male adm with AMS; lives with spouse, active with [facility].     Action/Plan:   Anticipated DC Date:     Anticipated DC Plan:    In-house referral  Clinical Social Worker      DC Associate Professor  CM consult      [facility] Choice  Resumption Of Svcs/PTA Provider   Choice offered to / List presented to:          [facility] arranged  HH-1 RN  HH-2 PT  HH-3 OT  HH-4 NURSE'S AIDE  HH-6 SOCIAL WORKER      HH agency  [facility]   Status of service:   Medicare Important Message given?   (If response is "NO", the following Medicare IM given date fields will be blank) Date Medicare IM given:   Date Additional Medicare IM given:    Discharge Disposition:    Per UR Regulation:    If discussed at Long Length of Stay Meetings, dates discussed:    Comments:  PCP:  Dr. Cristie Hem  12/23/11 Onnie Boer, RN, BSN 1335 CONSULT WAS GIVEN FOR HH SAFETY EVAL.  PT ALREADY BEING SEEN BY [facility].   WILL NOTIFIY THEM TO CONT TO SEE PT AT DC. WILL F/U ON OTHER DC NEEDS.  12/21/11 1600 Henrietta Mayo RN MSN CCM Received referral for medication assistance.  Per pt, he has drug coverage through his Medicare Complete.  Per spouse, they have multiple medical bills from previous hospitalizations and cannot afford copay.  States they applied for MCD and were told they were $30 over limit.  TC to financial counselor who states she will mail application for financial assistance program.  Advised pt/spouse to complete application and submit financial information requested.

## 2011-12-23 NOTE — Evaluation (Signed)
Physical Therapy Evaluation Patient Details Name: [name] MRN: [id number] DOB: 04-16-1956 Today's Date: 12/23/2011 Time: 4098-1191 PT Time Calculation (min): 15 min  PT Assessment / Plan / Recommendation Clinical Impression  [name] is 56 y/o male admitted for AMS. Presents to PT today with generalized weakness and decreased activity tolerance reporting 4 falls within the past year. Will benefit physical therapy in the acute setting to maximize strength and safety in prep for d/c home. Agree with HHPT for f/u.  Advised pt to ambulate with nursing several times daily so as to prevent further weakness while here. Pt and pt's wife concerned with being able to manage at home however noted SW and case manager involvement to increase assistance.     PT Assessment  Patient needs continued PT services    Follow Up Recommendations  Home health PT;Supervision for mobility/OOB    Barriers to Discharge        Equipment Recommendations  None recommended by PT    Recommendations for Other Services     Frequency Min 3X/week    Precautions / Restrictions Precautions Precautions: Fall         Mobility  Bed Mobility Bed Mobility: Supine to Sit Supine to Sit: 6: Modified independent (Device/Increase time);HOB elevated (20 degrees) Transfers Transfers: Sit to Stand;Stand to Sit Sit to Stand: 6: Modified independent (Device/Increase time);With upper extremity assist;From bed Stand to Sit: 5: Supervision;With upper extremity assist;To chair/3-in-1 Details for Transfer Assistance: cues for safe hand placement with regards to rollator in prep to sit Ambulation/Gait Ambulation/Gait Assistance: 5: Supervision Ambulation Distance (Feet): 80 Feet Assistive device: 4-wheeled walker Ambulation/Gait Assistance Details: good fluid gait pattern however slower and reports increased fatigue with further distances Gait Pattern: Trunk flexed;Shuffle Stairs: No    Exercises     PT Diagnosis:  Abnormality of gait;Generalized weakness;Acute pain  PT Problem List: Decreased strength;Decreased activity tolerance;Decreased balance;Decreased mobility PT Treatment Interventions: DME instruction;Gait training;Functional mobility training;Therapeutic activities;Therapeutic exercise;Balance training;Neuromuscular re-education;Patient/family education   PT Goals Acute Rehab PT Goals PT Goal Formulation: With patient/family Time For Goal Achievement: 12/30/11 Potential to Achieve Goals: Good Pt will go Stand to Sit: with modified independence PT Goal: Stand to Sit - Progress: Goal set today Pt will Transfer Bed to Chair/Chair to Bed: with modified independence PT Transfer Goal: Bed to Chair/Chair to Bed - Progress: Goal set today Pt will Ambulate: >150 feet;with modified independence;with least restrictive assistive device PT Goal: Ambulate - Progress: Goal set today Pt will Perform Home Exercise Program: Independently PT Goal: Perform Home Exercise Program - Progress: Goal set today  Visit Information  Last PT Received On: 12/23/11 Assistance Needed: +1    Subjective Data  Subjective: Just my back is bothering me.    Prior Functioning  Home Living Lives With: Spouse Available Help at Discharge: Family (spouse, supervision only) Type of Home: Mobile home Home Access: Ramped entrance Home Layout: One level Bathroom Shower/Tub: Walk-in shower (tiny step up) Firefighter: Standard Home Adaptive Equipment: Bedside commode/3-in-1;Walker - four wheeled Additional Comments: planning on getting a motorized w/c soon from the [place] Prior Function Level of Independence: Independent with assistive device(s) Driving: Yes Vocation: On disability Comments: reports that he is the primary caregiver of his wife as his wife does not drive and he is responsible for taking her to appointments etc.  Communication Communication: No difficulties    Cognition  Overall Cognitive Status: Appears  within functional limits for tasks assessed/performed Arousal/Alertness: Awake/alert Orientation Level: Appears intact for tasks assessed  Behavior During Session: [name] for tasks performed    Extremity/Trunk Assessment Right Upper Extremity Assessment RUE ROM/Strength/Tone: [name] for tasks assessed Left Upper Extremity Assessment LUE ROM/Strength/Tone: [facility] for tasks assessed Right Lower Extremity Assessment RLE ROM/Strength/Tone: [name] for tasks assessed Left Lower Extremity Assessment LLE ROM/Strength/Tone: [name] for tasks assessed   Balance Static Standing Balance Static Standing - Balance Support: Bilateral upper extremity supported Static Standing - Level of Assistance: 6: Modified independent (Device/Increase time)  End of Session PT - End of Session Equipment Utilized During Treatment: Gait belt Activity Tolerance: Patient tolerated treatment well Patient left: in chair;with call bell/phone within reach Nurse Communication: Mobility status  GP     [facility] [name] 12/23/2011, 2:21 PM

## 2011-12-24 DIAGNOSIS — M25579 Pain in unspecified ankle and joints of unspecified foot: Secondary | ICD-10-CM

## 2011-12-24 DIAGNOSIS — E118 Type 2 diabetes mellitus with unspecified complications: Secondary | ICD-10-CM

## 2011-12-24 DIAGNOSIS — M129 Arthropathy, unspecified: Secondary | ICD-10-CM

## 2011-12-24 DIAGNOSIS — R4182 Altered mental status, unspecified: Secondary | ICD-10-CM

## 2011-12-24 LAB — GLUCOSE, CAPILLARY: Glucose-Capillary: 174 mg/dL — ABNORMAL HIGH (ref 70–99)

## 2011-12-24 MED ORDER — MODAFINIL 100 MG PO TABS: 100.0000 mg | ORAL_TABLET | Freq: Every day | ORAL | Status: DC

## 2011-12-24 MED ORDER — BACLOFEN 5 MG HALF TABLET: 5.0000 mg | ORAL_TABLET | Freq: Three times a day (TID) | ORAL | Status: DC

## 2011-12-24 MED ORDER — FLUOXETINE HCL 40 MG PO CAPS: 40.0000 mg | ORAL_CAPSULE | Freq: Every day | ORAL | Status: DC

## 2011-12-24 NOTE — Progress Notes (Signed)
Patient D/C instructions and education given to patient. All questions answered to patient's satisfaction. Pt D/C home with no signs of acute distress.

## 2011-12-24 NOTE — Discharge Summary (Signed)
Physician Discharge Summary  Patient ID: [name] MRN: [id number] DOB/AGE: 10/23/1955 56 y.o.  Admit date: 12/20/2011 Discharge date: 12/24/2011  Primary Care Physician:  Cristie Hem, MD  Discharge Diagnoses:   .Altered mental status improved  .SLEEP APNEA, OBSTRUCTIVE noncompliant with CPAP  .RESTLESS LEG SYNDROME .CKD (chronic kidney disease), stage III .DM type 2 with diabetic peripheral neuropathy .HYPERLIPIDEMIA .Morbid obesity .HYPERTENSION .CORONARY ARTERY DISEASE .CONGESTIVE HEART FAILURE .history of Myoclonic seizure disorder .NECK PAIN, CHRONIC .Gastroparesis .Chronic constipation  Consults:  Neurology   Discharge Medications: Medication List  As of 12/24/2011 10:33 AM   STOP taking these medications         DULoxetine 30 MG capsule      furosemide 40 MG tablet      Melatonin 10 MG Tabs      spironolactone 25 MG tablet      traZODone 100 MG tablet         TAKE these medications         baclofen 5 mg Tabs   Commonly known as: LIORESAL   Take 0.5 tablets (5 mg total) by mouth 3 (three) times daily.      CENTRUM SILVER ULTRA MENS Tabs   Take 1 tablet by mouth daily.      clonazePAM 0.5 MG tablet   Commonly known as: KLONOPIN   Take 0.5-1 mg by mouth 2 (two) times daily. 0.5 mg in the a.m. And 1 mg at night      docusate sodium 100 MG capsule   Commonly known as: COLACE   Take 100-200 mg by mouth 2 (two) times daily. Take 2 tablets every morning and 1 tablet at bedtime      FLUoxetine 40 MG capsule   Commonly known as: PROZAC   Take 1 capsule (40 mg total) by mouth daily.      furosemide 20 MG tablet   Commonly known as: LASIX   Take 60 mg by mouth daily.      gabapentin 400 MG capsule   Commonly known as: NEURONTIN   Take 400 mg by mouth 3 (three) times daily.      hydrOXYzine 10 MG tablet   Commonly known as: ATARAX/VISTARIL   Take 10 mg by mouth 3 (three) times daily as needed. For itching      insulin NPH-insulin regular (70-30)  100 UNIT/ML injection   Commonly known as: NOVOLIN 70/30   Inject 15 Units into the skin 2 (two) times daily with a meal.      levETIRAcetam 500 MG tablet   Commonly known as: KEPPRA   Take 500 mg by mouth every 12 (twelve) hours.      modafinil 100 MG tablet   Commonly known as: PROVIGIL   Take 1 tablet (100 mg total) by mouth daily. Please take daily for another 3 days, then increase to twice a day      oxycodone 30 MG immediate release tablet   Commonly known as: ROXICODONE   Take 30 mg by mouth every 6 (six) hours as needed.      pantoprazole 40 MG tablet   Commonly known as: PROTONIX   Take 40 mg by mouth daily.      polyethylene glycol packet   Commonly known as: MIRALAX / GLYCOLAX   Take 17 g by mouth daily.      rOPINIRole 2 MG tablet   Commonly known as: REQUIP   Take 2 mg by mouth at bedtime.  Brief H and P: For complete details please refer to admission H and P, but in brief, patient is a 56 year old male with multiple medical problems including diabetes, hypertension and unspecified myoclonic disorder being treated by neurology in [place] presented to the third episode of mental status change. Patient had been admitted recently at [facility] for the same but no clear cause was identified. Prior to admission her night before while engaged in activity, patient again suddenly became glassy and unresponsive, fell into sleep x-ray. No associated seizure-like activity was seen. EMS was called and patient arrived to the emergency room in 45 minutes   Hospital Course:   Altered mental status - episodic/transient: With recurrent admissions for the same symptoms. Patient describes it more as "droopy or intensely tired" especially after some exertion. He has been followed by Dr. Gerilyn Pilgrim, and currently in [facility], Dr. Zola Button. States that he had myoclonic jerks and was prescribed clonazepam by Dr. Zola Button. There was a concern that patient may have  atypical presentation of seizures or probably related to medications. Or a possibility of narcolepsy by the description of patient's symptoms.  EEG done during this admission shows normal EEG recording during wakefulness and brief sleep. No evidence of an epileptic disorder was demonstrated. Neurology consultation was obtained who also felt it is more likely narcolepsy and less likely to be a seizure as he was already on benzodiazepines and Neurontin. Patient was started on provigil and recommended to continue Keppra. Patient has appointment with his nephrologist from [facility] tomorrow and he may need to have EMU to further investigate his staring spells. Patient was also covered recommended to cut down his oxycodone and other sedating medications including trazodone and decreasing his Cymbalta dose.  Diabetes mellitus  - Hemoglobin A1c 7.4, continue NPH insulin   Hypertension: Stable   Chronic kidney disease stage III; Baseline creatinine appears to be approximately 1.54, patient had some acute renal insufficiency at the time of admission with creatinine of 2.16. Patient was on Lasix and spironolactone. The diuretics were held and patient was placed on gentle hydration and at the time of discharge the creatinine is about his baseline at 1.6.   Gastroparesis with gastric outlet obstruction/ associated chronic constipation  - Given issues with gastric and colonic transit abnormalities patient may be experiencing altered mentation related to medication bezoar with subsequent rapid absorption of retained medications once he has had a bowel movement and transit time of the gut is temporarily improved. Patient is seeing Dr. Marina Goodell ([facility]) and per patient he will be seeing GI at [facility].  Obesity: Endorses has lost 200 pounds over the past year-reports weight loss is unexplained, Workup outpatient with PCP and gastroenterology.  Sleep apnea:  Patient admits to being noncompliant with his CPAP. Patient  was determined to followup with the Dr. Maple Hudson  Tobacco abuse    Day of Discharge BP 139/86  Pulse 72  Temp 98 F (36.7 C) (Oral)  Resp 18  Ht 6' (1.829 m)  Wt 94.9 kg (209 lb 3.5 oz)  BMI 28.37 kg/m2  SpO2 97%  Physical Exam: General: Alert and awake oriented x3 not in any acute distress. HEENT: anicteric sclera, pupils reactive to light and accommodation CVS: S1-S2 clear no murmur rubs or gallops Chest: clear to auscultation bilaterally, no wheezing rales or rhonchi Abdomen: soft nontender, nondistended, normal bowel sounds, no organomegaly Extremities: no cyanosis, clubbing or edema noted bilaterally Neuro: Cranial nerves II-XII intact, no focal neurological deficits   The  results of significant diagnostics from this hospitalization (including imaging, microbiology, ancillary and laboratory) are listed below for reference.    LAB RESULTS: Basic Metabolic Panel:  Lab 12/23/11 1610 12/22/11 0728  NA 141 143  K 3.9 3.9  CL 104 105  CO2 28 30  GLUCOSE 109* 147*  BUN 28* 25*  CREATININE 1.60* 1.61*  CALCIUM 8.9 8.9  MG -- --  PHOS -- --   Liver Function Tests:  Lab 12/20/11 1027  AST 13  ALT 7  ALKPHOS 74  BILITOT 0.3  PROT 6.1  ALBUMIN 3.4*   No results found for this basename: LIPASE:2,AMYLASE:2 in the last 168 hours  Lab 12/20/11 0314  AMMONIA 29   CBC:  Lab 12/21/11 0500 12/20/11 0128  WBC 8.0 6.9  NEUTROABS -- --  HGB 12.5* 12.7*  HCT 37.5* 38.5*  MCV 86.4 --  PLT 194 204   CBG:  Lab 12/24/11 0652 12/23/11 2125  GLUCAP 136* 185*    Significant Diagnostic Studies:  Ct Head Wo Contrast  12/20/2011  *RADIOLOGY REPORT*  Clinical Data: 56 year old male with altered level of consciousness and altered mental status.  CT HEAD WITHOUT CONTRAST  Technique:  Contiguous axial images were obtained from the base of the skull through the vertex without contrast.  Comparison: 12/03/2011  Findings: No intracranial abnormalities are identified, including  mass lesion or mass effect, hydrocephalus, extra-axial fluid collection, midline shift, hemorrhage, or acute infarction.  The visualized bony calvarium is unremarkable.  IMPRESSION: Unremarkable noncontrast head CT.   Original Report Authenticated By: Rosendo Gros, M.D.    Dg Chest Port 1 View  12/20/2011  *RADIOLOGY REPORT*  Clinical Data: 56 year old male with shortness of breath.  PORTABLE CHEST - 1 VIEW  Comparison: 02/15/2011  Findings: The cardiomediastinal silhouette is unremarkable. The lungs are clear. There is no evidence of focal airspace disease, pulmonary edema, suspicious pulmonary nodule/mass, pleural effusion, or pneumothorax. No acute bony abnormalities are identified.  IMPRESSION: No evidence of active cardiopulmonary disease.   Original Report Authenticated By: Rosendo Gros, M.D.      Disposition and Follow-up: Discharge Orders    Future Orders Please Complete By Expires   Diet - low sodium heart healthy      Increase activity slowly      Discharge instructions      Comments:   Please schedule an appt with Dr Maple Hudson in 2 weeks for sleep apnea.       DISPOSITION: home  DIET: diabetic diet ACTIVITY:as tolerated   DISCHARGE FOLLOW-UP Follow-up Information    Follow up with Cristie Hem, MD. Schedule an appointment as soon as possible for a visit in 10 days. (for hospital follow-up)    Contact information:   [address] [place] [address] [phone]       Follow up with Waymon Budge, MD. Schedule an appointment as soon as possible for a visit in 2 weeks. (-follow-up for sleep apnea)    Contact information:   [address] [employer] [place] [address] [phone]       Follow up with Dr Zola Button  on 12/25/2011. [facility] tomorrow- please keep your appt)          Time spent on Discharge: 45 mins  Signed:   Yarielis Funaro M.D. [facility] 12/24/2011, 10:33 AM Pager:  [phone]

## 2012-01-18 ENCOUNTER — Telehealth: Payer: Self-pay | Admitting: Internal Medicine

## 2012-01-18 NOTE — Telephone Encounter (Signed)
Called, spoke with pt's wife who states pt was referred to a neuro sleep specialist.  States she made an appt with Dr. Maple Hudson for pt thinking he would need another sleep study because he has lost 200 lbs but the was advised by the neuro sleep specialist office that they would prefer to do the sleep study themselves.  Therefore, she is requesting to cancel the appt for pt with Dr. Maple Hudson on Oct 31.  Appt cancelled, wife aware.  Nothing further needed at this time.

## 2012-01-25 ENCOUNTER — Ambulatory Visit: Payer: Medicare Other | Admitting: Gastroenterology

## 2012-01-27 ENCOUNTER — Ambulatory Visit (INDEPENDENT_AMBULATORY_CARE_PROVIDER_SITE_OTHER): Payer: Medicare Other | Admitting: Gastroenterology

## 2012-01-27 ENCOUNTER — Encounter: Payer: Self-pay | Admitting: Gastroenterology

## 2012-01-27 VITALS — BP 99/62 | HR 66 | Temp 97.6°F | Ht 72.0 in | Wt 211.4 lb

## 2012-01-27 DIAGNOSIS — K3184 Gastroparesis: Secondary | ICD-10-CM

## 2012-01-27 DIAGNOSIS — R112 Nausea with vomiting, unspecified: Secondary | ICD-10-CM

## 2012-01-27 DIAGNOSIS — K5909 Other constipation: Secondary | ICD-10-CM

## 2012-01-27 DIAGNOSIS — K5900 Constipation, unspecified: Secondary | ICD-10-CM

## 2012-01-27 DIAGNOSIS — R634 Abnormal weight loss: Secondary | ICD-10-CM

## 2012-01-27 MED ORDER — PROMETHAZINE HCL 25 MG RE SUPP: 25.0000 mg | Freq: Four times a day (QID) | RECTAL | Status: DC | PRN

## 2012-01-27 MED ORDER — ONDANSETRON 4 MG PO TBDP: 4.0000 mg | ORAL_TABLET | ORAL | Status: DC | PRN

## 2012-01-27 NOTE — Progress Notes (Signed)
Faxed to PCP

## 2012-01-27 NOTE — Progress Notes (Signed)
Primary Care Physician: DANCEL,REX, MD  Primary Gastroenterologist:  Michael Rourk, MD   Chief Complaint  Patient presents with  . Follow-up  . Emesis    HPI: [name] is a 56 y.o. male here for f/u of recent hospitalization. He has h/o abnormal weight loss, chronic vomiting (gastroparesis). Last seen in office 05/2011. Weighed 234 lbs at that time. Weighed 306 lbs in 06/2010. Today weighs 211 lbs.  Patient no showed for appointment with Dr. Rourk in 09/2011 (did not have money for copay). Recent admission (twice in 11/2011), for recurrent mental status changes. Normal EEG. Head CT X 2 in 11/2011 unremarkable. Ammonia level 29, H/H 12.5/37.5. Creatinine 1.7. Patient reports that the doctors are concerned that his medication is not being absorbed appropriately due to the gastroparesis.   Not vomiting as much as had been. Last three days worse than the last one month. Eating small meals. Eating mostly high calorie sweets, to "maintain my weight". No urge to eat. Fatigue. No heartburn. Postprandial upper abdominal pain and LUQ bulge pp. Feels like food getting stuck and eventually when moves down, he feels better. Urine drug screen negative in ED but at pain clinic oxycodone show up.  BM, tries every day for 30 minutes. Some days large stool. And then diarrhea for 3-4 days afterwards. Other times no stools. Takes Miralax daily. With vomiting, teeth are damaged. Vomiting things eaten two days ago. Not taking antiemetics usually. Will occasionally use phenergan suppositories. Takes oxycodone every four hours but states poor control of back pain. Out of pain medication as of today and cannot get any until next week.  Current Outpatient Prescriptions  Medication Sig Dispense Refill  . baclofen (LIORESAL) 5 mg TABS Take 0.5 tablets (5 mg total) by mouth 3 (three) times daily.  90 tablet  3  . clonazePAM (KLONOPIN) 0.5 MG tablet Take 0.5-1 mg by mouth 2 (two) times daily. 0.5 mg in the a.m. And 1 mg at  night      . docusate sodium (COLACE) 100 MG capsule Take 100-200 mg by mouth 2 (two) times daily. Take 2 tablets every morning and 1 tablet at bedtime      . FLUoxetine (PROZAC) 40 MG capsule Take 1 capsule (40 mg total) by mouth daily.  30 capsule  2  . furosemide (LASIX) 20 MG tablet Take 40 mg by mouth as needed.       . gabapentin (NEURONTIN) 400 MG capsule Take 400 mg by mouth 3 (three) times daily.      . hydrOXYzine (ATARAX/VISTARIL) 10 MG tablet Take 10 mg by mouth 3 (three) times daily as needed. For itching      . insulin NPH-insulin regular (NOVOLIN 70/30) (70-30) 100 UNIT/ML injection Inject 15 Units into the skin 2 (two) times daily with a meal.  10 mL    . Multiple Vitamins-Minerals (CENTRUM SILVER ULTRA MENS) TABS Take 1 tablet by mouth daily.        . oxycodone (ROXICODONE) 30 MG immediate release tablet Take 30 mg by mouth every 4 (four) hours as needed.       . pantoprazole (PROTONIX) 40 MG tablet Take 40 mg by mouth daily.      . polyethylene glycol (MIRALAX / GLYCOLAX) packet Take 17 g by mouth daily.      . rOPINIRole (REQUIP) 2 MG tablet Take 2 mg by mouth at bedtime.      . modafinil (PROVIGIL) 100 MG tablet Take 1 tablet (100 mg total) by mouth   daily. Please take daily for another 3 days, then increase to twice a day  60 tablet  3  . ondansetron (ZOFRAN-ODT) 4 MG disintegrating tablet Take 1 tablet (4 mg total) by mouth every 4 (four) hours as needed for nausea.  20 tablet  0  . promethazine (PHENERGAN) 25 MG suppository Place 1 suppository (25 mg total) rectally every 6 (six) hours as needed for nausea.  12 each  0  . promethazine (PHENERGAN) 25 MG suppository Place 1 suppository (25 mg total) rectally every 6 (six) hours as needed for nausea.  12 each  0    Allergies as of 01/27/2012  . (No Known Allergies)   Past Medical History  Diagnosis Date  . Diabetes mellitus   . Hypertension   . CHF (congestive heart failure)   . CRD (chronic renal disease)   . Arthritis    . Obesity   . Depression   . Sleep apnea   . Diabetic neuropathy   . CAD (coronary artery disease)   . Mitral regurgitation   . Esophagitis, reflux 07/07/10    MW tear, erosive reflux esophagitis, 2cm hh  . S/P colonoscopy April 2012    Rourk: torturous colon, hyperplastic polyps  . Collagen vascular disease     "myoclonic "  . Tremor   . Diverticulum   . Hiatal hernia   . GERD (gastroesophageal reflux disease)     erosive  . Gastroparesis 11/2010    No emptying at two hours.    Past Surgical History  Procedure Date  . Neck surgery     discectomy  . Hand surgery     bilateral  . Colonoscopy before 2002    [place]  . Appendectomy   . Other surgical history     bone graft-knee  . Anterior lumbar corpectomy w/ fusion     C5-6/C4-7  . Colonoscopy 08/21/2010    elongaed tortuous colon otherwise normal  . Esophagogastroduodenoscopy 07/08/10    Dr. Rourk-distal esophageal erosions and excoriations consistent with erosive reflux esophagitis, hiatal hernia   Family History  Problem Relation Age of Onset  . Stroke Father 57  . Stroke Mother 54  . Colon cancer Neg Hx   . Crohn's disease Neg Hx   . Cirrhosis Neg Hx   . Ulcerative colitis Neg Hx   . Stomach cancer Neg Hx    History   Social History  . Marital Status: Married    Spouse Name: N/A    Number of Children: 1  . Years of Education: N/A   Occupational History  . disabled    Social History Main Topics  . Smoking status: Current Every Day Smoker -- 0.5 packs/day for 40 years    Types: Cigarettes  . Smokeless tobacco: Never Used   Comment: ordered nicotine patches. start date is 27 July 2010  . Alcohol Use: No  . Drug Use: No  . Sexually Active: Yes     erectile dysfunction   Other Topics Concern  . None   Social History Narrative   Lost one dgt due to homicide.     ROS:  General:see hpi ENT: Negative for hoarseness, difficulty swallowing , nasal congestion. CV: Negative for chest pain, angina,  palpitations, dyspnea on exertion, peripheral edema.  Respiratory: Negative for dyspnea at rest, dyspnea on exertion, cough, sputum, wheezing.  GI: See history of present illness. GU:  Negative for dysuria, hematuria, urinary incontinence, urinary frequency, nocturnal urination.  Endo: see hpi    Physical Examination:     BP 99/62  Pulse 66  Temp 97.6 F (36.4 C) (Temporal)  Ht 6' (1.829 m)  Wt 211 lb 6.4 oz (95.89 kg)  BMI 28.67 kg/m2  General: Well-nourished, well-developed in no acute distress. Accompanied by wife.  Eyes: No icterus. Mouth: Oropharyngeal mucosa moist and pink , no lesions erythema or exudate. Dentition in poor repair.  Lungs: Clear to auscultation bilaterally.  Heart: Regular rate and rhythm, no murmurs rubs or gallops.  Abdomen: Bowel sounds are normal, epigastric tenderness, nondistended, no hepatosplenomegaly or masses, no abdominal bruits or hernia , no rebound or guarding.   Extremities: No lower extremity edema. No clubbing or deformities. Neuro: Alert and oriented x 4   Skin: Warm and dry, no jaundice.   Psych: Alert and cooperative, normal mood and affect.  Labs:  Lab Results  Component Value Date   CREATININE 1.60* 12/23/2011   BUN 28* 12/23/2011   NA 141 12/23/2011   K 3.9 12/23/2011   CL 104 12/23/2011   CO2 28 12/23/2011   Lab Results  Component Value Date   WBC 8.0 12/21/2011   HGB 12.5* 12/21/2011   HCT 37.5* 12/21/2011   MCV 86.4 12/21/2011   PLT 194 12/21/2011   Lab Results  Component Value Date   ALT 7 12/20/2011   AST 13 12/20/2011   ALKPHOS 74 12/20/2011   BILITOT 0.3 12/20/2011    Imaging Studies: No results found.      

## 2012-01-27 NOTE — Assessment & Plan Note (Addendum)
Ongoing abnormal weight loss, 95 lb since 06/2010, in setting of recurrent N/V and abnormal GES in 2012. Last EGD 06/2010, no outlet obstruction, there was erosive reflux esophagitis. Colonoscopy 07/2010, no significant findings. Patient had negative TTG for celiac disease. Normal fasting AM cortisol level. Prealbumin normal in 07/2011. Patient has failed gastroparesis diet. Reluctant to start Reglan given other numerous medications and neurological conditions.   Discussed with patient and wife at length today. I would recommend referral to Dr. Claire Shown for further management of GI motility issues. However, would advise upper GI tract to be evaluated for stricture or GOO. Patient agrees to EGD. Due to polypharmacy his procedure will be done with deep sedation in OR as it was in the past.   Phenergan suppositories and Zofran ODT prn. RX sent to [facility].   Patient also inquires about heavy metal testing. To discuss with Dr. Jena Gauss.  Recommend patient keep three day calorie count to ensure adequate caloric intake.

## 2012-01-27 NOTE — Patient Instructions (Addendum)
We have scheduled you for an upper endoscopy with Dr. Jena Gauss. See separate instructions.  Keep 3 day calorie count. We have sent referral to Dr. Alycia Rossetti for gastroparesis and weight loss. They should contact you directly. Let us know if you have not heard from them within 1-2 weeks.  Phenergan and Zofran sent to [facility] pharmacy.  Calorie Counting Diet A calorie counting diet requires you to eat the number of calories that are right for you in a day. Calories are the measurement of how much energy you get from the food you eat. Eating the right amount of calories is important for staying at a healthy weight. If you eat too many calories, your body will store them as fat and you may gain weight. If you eat too few calories, you may lose weight. Counting the number of calories you eat during a day will help you know if you are eating the right amount. A Registered Dietitian can determine how many calories you need in a day. The amount of calories needed varies from person to person. If your goal is to lose weight, you will need to eat fewer calories. Losing weight can benefit you if you are overweight or have health problems such as heart disease, high blood pressure, or diabetes. If your goal is to gain weight, you will need to eat more calories. Gaining weight may be necessary if you have a certain health problem that causes your body to need more energy. TIPS Whether you are increasing or decreasing the number of calories you eat during a day, it may be hard to get used to changes in what you eat and drink. The following are tips to help you keep track of the number of calories you eat.  Measure foods at home with measuring cups. This helps you know the amount of food and number of calories you are eating.  Restaurants often serve food in amounts that are larger than 1 serving. While eating out, estimate how many servings of a food you are given. For example, a serving of cooked [place] is  cup or about the  size of half of a fist. Knowing serving sizes will help you be aware of how much food you are eating at restaurants.  Ask for smaller portion sizes or child-size portions at restaurants.  Plan to eat half of a meal at a restaurant. Take the rest home or share the other half with a friend.  Read the Nutrition Facts panel on food labels for calorie content and serving size. You can find out how many servings are in a package, the size of a serving, and the number of calories each serving has.  For example, a package might contain 3 cookies. The Nutrition Facts panel on that package says that 1 serving is 1 cookie. Below that, it will say there are 3 servings in the container. The calories section of the Nutrition Facts label says there are 90 calories. This means there are 90 calories in 1 cookie (1 serving). If you eat 1 cookie you have eaten 90 calories. If you eat all 3 cookies, you have eaten 270 calories (3 servings x 90 calories = 270 calories). The list below tells you how big or small some common portion sizes are.  1 oz.........4 stacked dice.  3 oz........Marland KitchenDeck of cards.  1 tsp.......Marland KitchenTip of little finger.  1 tbs......Marland KitchenMarland KitchenThumb.  2 tbs.......Marland KitchenGolf ball.   cup......Marland KitchenHalf of a fist.  1 cup.......Marland KitchenA fist. KEEP A FOOD LOG Write down  every food item you eat, the amount you eat, and the number of calories in each food you eat during the day. At the end of the day, you can add up the total number of calories you have eaten. It may help to keep a list like the one below. Find out the calorie information by reading the Nutrition Facts panel on food labels. Breakfast  Bran cereal (1 cup, 110 calories).  Fat-free milk ( cup, 45 calories). Snack  Apple (1 medium, 80 calories). Lunch  Spinach (1 cup, 20 calories).  Tomato ( medium, 20 calories).  Chicken breast strips (3 oz, 165 calories).  Shredded cheddar cheese ( cup, 110 calories).  Light [place] dressing (2 tbs, 60  calories).  Whole-wheat bread (1 slice, 80 calories).  Tub margarine (1 tsp, 35 calories).  Vegetable soup (1 cup, 160 calories). Dinner  Pork chop (3 oz, 190 calories).  Brown [place] (1 cup, 215 calories).  Steamed broccoli ( cup, 20 calories).  Strawberries (1  cup, 65 calories).  Whipped cream (1 tbs, 50 calories). Daily Calorie Total: 1425 Document Released: 04/13/2005 Document Revised: 07/06/2011 Document Reviewed: 10/08/2006 [facility] Patient Information 2013 [place], [place].

## 2012-02-04 ENCOUNTER — Telehealth: Payer: Self-pay | Admitting: *Deleted

## 2012-02-04 MED ORDER — DICYCLOMINE HCL 10 MG PO CAPS: 10.0000 mg | ORAL_CAPSULE | Freq: Three times a day (TID) | ORAL | Status: DC | PRN

## 2012-02-04 NOTE — Telephone Encounter (Signed)
Pt aware, rx called in to [facility] [place] 

## 2012-02-04 NOTE — Telephone Encounter (Signed)
Spoke with pt's wife, pt is having upper abd cramping, he is doubled over in pain in a store parking lot. pt is not having any SOB, dizziness, fever or vomiting. He stated he would feel better if he did vomit. Pt does have phenergan but it doesn't seem to help much. Couldn't afford the zofran. pts last BM was firm and normal, he had that BM about 20 minutes ago. Pt is scheduled for procedure at the end of the month with [facility]. They are requesting something called in for Cramping or if there is anything available otc because pt is in the doughnut hole. Please advise.

## 2012-02-04 NOTE — Telephone Encounter (Signed)
Mrs Buhl called today for her husband. He is having cramps and vomiting. She is concerned about what she needs to do next. Please follow up. Thanks.

## 2012-02-04 NOTE — Telephone Encounter (Signed)
Sounds like significant pain and he should consider going to ER.   He can try Bentyl for cramping but should use sparingly as it can make GI motility slow and cause constipation. Should be on [facility] $4 plan. I did not send electronically in case he wants it to go to [place]. Please call in or fax in.   Go to ER if abdominal pain continues.

## 2012-02-10 ENCOUNTER — Telehealth: Payer: Self-pay | Admitting: Internal Medicine

## 2012-02-10 ENCOUNTER — Encounter (HOSPITAL_COMMUNITY): Payer: Self-pay

## 2012-02-10 DIAGNOSIS — R1110 Vomiting, unspecified: Secondary | ICD-10-CM

## 2012-02-10 DIAGNOSIS — R109 Unspecified abdominal pain: Secondary | ICD-10-CM

## 2012-02-10 DIAGNOSIS — R634 Abnormal weight loss: Secondary | ICD-10-CM

## 2012-02-10 NOTE — Telephone Encounter (Signed)
Spoke with pts wife- pts appointment at [facility] until January- she called [facility] and asked if he could be seen sooner, they informed her that the only way he could be seen sooner is if [facility] or [facility] called and talked with the doctor and informed him of what is going on. She wants him seen soon because she is afraid she is not going to survive the next time he has an "episode".  He will be seeing Dr. Willow Ora- [phone]

## 2012-02-10 NOTE — Telephone Encounter (Signed)
Tried to call- NA 

## 2012-02-10 NOTE — Telephone Encounter (Signed)
Pt's wife had LMOM yesterday evening and has called again today asking to speak with [facility] regarding patient. Please call her back at [phone]

## 2012-02-11 NOTE — Telephone Encounter (Signed)
Raynelle Fanning, check with lab, ?random urine or 24 hour urine?

## 2012-02-11 NOTE — Telephone Encounter (Signed)
First we need to get his EGD done. Then we will have more information to provide Dr. Alycia Rossetti. Is there any way to get his EGD done prior to 02/25/12?  Let's also do a heavy metal screen (discussed with patient at [facility]). Order has been entered.

## 2012-02-11 NOTE — Addendum Note (Signed)
Addended by: Tiffany Kocher on: 02/11/2012 04:14 PM   Modules accepted: Orders

## 2012-02-12 NOTE — Telephone Encounter (Signed)
Pt is aware, lab order faxed to lab. [facility] does not have any earlier appts.

## 2012-02-12 NOTE — Telephone Encounter (Signed)
Spoke with lab, it is a 24 hour urine and pt must avoid seafood for 1 week prior to collection.

## 2012-02-15 NOTE — Telephone Encounter (Signed)
Patient called back and stated that he needs to keep his procedure on the 31st family has already took a vacation day for transportation

## 2012-02-15 NOTE — Telephone Encounter (Signed)
I have LMOM for patient to return my call to see if we can move procedure up earlier

## 2012-02-17 ENCOUNTER — Encounter (HOSPITAL_COMMUNITY)
Admission: RE | Admit: 2012-02-17 | Discharge: 2012-02-17 | Disposition: A | Discharge status: Home / Self Care | Payer: Medicare Other | Source: Ambulatory Visit | Attending: Internal Medicine | Admitting: Internal Medicine

## 2012-02-17 ENCOUNTER — Encounter (HOSPITAL_COMMUNITY): Payer: Self-pay

## 2012-02-17 HISTORY — DX: Unspecified convulsions: R56.9

## 2012-02-17 HISTORY — DX: Other chronic pain: G89.29

## 2012-02-17 HISTORY — DX: Dorsalgia, unspecified: M54.9

## 2012-02-17 LAB — BASIC METABOLIC PANEL
BUN: 24 mg/dL — ABNORMAL HIGH (ref 6–23)
Calcium: 9.2 mg/dL (ref 8.4–10.5)
Chloride: 104 mEq/L (ref 96–112)
Creatinine, Ser: 1.65 mg/dL — ABNORMAL HIGH (ref 0.50–1.35)
GFR calc Af Amer: 52 mL/min — ABNORMAL LOW (ref >90)
GFR calc non Af Amer: 45 mL/min — ABNORMAL LOW (ref >90)

## 2012-02-17 NOTE — Patient Instructions (Addendum)
19 [name]  02/17/2012   Your procedure is scheduled on:  02/25/2012  Report to [facility] at  615  AM.  Call this number if you have problems the morning of surgery: [phone]   Remember:   Do not eat food:After Midnight.  May have clear liquids:until Midnight .    Take these medicines the morning of surgery with A SIP OF WATER:  Prozac,clonopin, neurontin, atarax, requip and phenergan   Do not wear jewelry, make-up or nail polish.  Do not wear lotions, powders, or perfumes. You may wear deodorant.  Do not shave 48 hours prior to surgery. Men may shave face and neck.  Do not bring valuables to the hospital.  Contacts, dentures or bridgework may not be worn into surgery.  Leave suitcase in the car. After surgery it may be brought to your room.  For patients admitted to the hospital, checkout time is 11:00 AM the day of discharge.   Patients discharged the day of surgery will not be allowed to drive home.  Name and phone number of your driver: family  Special Instructions: N/A   Please read over the following fact sheets that you were given: Pain Booklet, MRSA Information and Surgical Site Infection Prevention Esophagogastroduodenoscopy This is an endoscopic procedure (a procedure that uses a device like a flexible telescope) that allows your caregiver to view the upper stomach and small bowel. This test allows your caregiver to look at the esophagus. The esophagus carries food from your mouth to your stomach. They can also look at your duodenum. This is the first part of the small intestine that attaches to the stomach. This test is used to detect problems in the bowel such as ulcers and inflammation. PREPARATION FOR TEST Nothing to eat after midnight the day before the test. NORMAL FINDINGS Normal esophagus, stomach, and duodenum. Ranges for normal findings may vary among different laboratories and hospitals. You should always check with your doctor after having lab  work or other tests done to discuss the meaning of your test results and whether your values are considered within normal limits. MEANING OF TEST  Your caregiver will go over the test results with you and discuss the importance and meaning of your results, as well as treatment options and the need for additional tests if necessary. OBTAINING THE TEST RESULTS It is your responsibility to obtain your test results. Ask the lab or department performing the test when and how you will get your results. Document Released: 08/14/2004 Document Revised: 07/06/2011 Document Reviewed: 03/23/2008 [facility] Patient Information 2013 [place], [place]. PATIENT INSTRUCTIONS POST-ANESTHESIA  IMMEDIATELY FOLLOWING SURGERY:  Do not drive or operate machinery for the first twenty four hours after surgery.  Do not make any important decisions for twenty four hours after surgery or while taking narcotic pain medications or sedatives.  If you develop intractable nausea and vomiting or a severe headache please notify your doctor immediately.  FOLLOW-UP:  Please make an appointment with your surgeon as instructed. You do not need to follow up with anesthesia unless specifically instructed to do so.  WOUND CARE INSTRUCTIONS (if applicable):  Keep a dry clean dressing on the anesthesia/puncture wound site if there is drainage.  Once the wound has quit draining you may leave it open to air.  Generally you should leave the bandage intact for twenty four hours unless there is drainage.  If the epidural site drains for more than 36-48 hours please call the anesthesia department.  QUESTIONS?:  Please feel free to call your physician or the hospital operator if you have any questions, and they will be happy to assist you.

## 2012-02-18 ENCOUNTER — Other Ambulatory Visit (HOSPITAL_COMMUNITY): Payer: Medicare Other

## 2012-02-25 ENCOUNTER — Institutional Professional Consult (permissible substitution): Payer: Medicare Other | Admitting: Internal Medicine

## 2012-02-25 ENCOUNTER — Encounter (HOSPITAL_COMMUNITY)
Admission: RE | Disposition: A | Discharge status: Home / Self Care | Payer: Self-pay | Source: Ambulatory Visit | Attending: Internal Medicine

## 2012-02-25 ENCOUNTER — Encounter (HOSPITAL_COMMUNITY): Payer: Self-pay | Admitting: *Deleted

## 2012-02-25 ENCOUNTER — Encounter (HOSPITAL_COMMUNITY): Payer: Self-pay | Admitting: Anesthesiology

## 2012-02-25 ENCOUNTER — Ambulatory Visit (HOSPITAL_COMMUNITY): Payer: Medicare Other | Admitting: Anesthesiology

## 2012-02-25 ENCOUNTER — Ambulatory Visit (HOSPITAL_COMMUNITY)
Admission: RE | Admit: 2012-02-25 | Discharge: 2012-02-25 | Disposition: A | Discharge status: Home / Self Care | Payer: Medicare Other | Source: Ambulatory Visit | Attending: Internal Medicine | Admitting: Internal Medicine

## 2012-02-25 DIAGNOSIS — R627 Adult failure to thrive: Secondary | ICD-10-CM | POA: Insufficient documentation

## 2012-02-25 DIAGNOSIS — K269 Duodenal ulcer, unspecified as acute or chronic, without hemorrhage or perforation: Secondary | ICD-10-CM

## 2012-02-25 DIAGNOSIS — R112 Nausea with vomiting, unspecified: Secondary | ICD-10-CM

## 2012-02-25 DIAGNOSIS — K449 Diaphragmatic hernia without obstruction or gangrene: Secondary | ICD-10-CM

## 2012-02-25 DIAGNOSIS — R634 Abnormal weight loss: Secondary | ICD-10-CM | POA: Insufficient documentation

## 2012-02-25 DIAGNOSIS — E119 Type 2 diabetes mellitus without complications: Secondary | ICD-10-CM | POA: Insufficient documentation

## 2012-02-25 DIAGNOSIS — I10 Essential (primary) hypertension: Secondary | ICD-10-CM | POA: Insufficient documentation

## 2012-02-25 DIAGNOSIS — Z794 Long term (current) use of insulin: Secondary | ICD-10-CM | POA: Insufficient documentation

## 2012-02-25 DIAGNOSIS — Z01812 Encounter for preprocedural laboratory examination: Secondary | ICD-10-CM | POA: Insufficient documentation

## 2012-02-25 HISTORY — PX: ESOPHAGOGASTRODUODENOSCOPY (EGD) WITH PROPOFOL: SHX5813

## 2012-02-25 LAB — GLUCOSE, CAPILLARY: Glucose-Capillary: 194 mg/dL — ABNORMAL HIGH (ref 70–99)

## 2012-02-25 SURGERY — ESOPHAGOGASTRODUODENOSCOPY (EGD) WITH PROPOFOL
Anesthesia: Monitor Anesthesia Care

## 2012-02-25 MED ORDER — PROPOFOL INFUSION 10 MG/ML OPTIME
INTRAVENOUS | Status: DC | PRN
  Administered 2012-02-25: 75 ug/kg/min via INTRAVENOUS

## 2012-02-25 MED ORDER — PROPOFOL 10 MG/ML IV BOLUS
INTRAVENOUS | Status: DC | PRN
  Administered 2012-02-25: 10 mg via INTRAVENOUS

## 2012-02-25 MED ORDER — PROPOFOL 10 MG/ML IV EMUL
INTRAVENOUS | Status: AC
  Filled 2012-02-25: qty 20

## 2012-02-25 MED ORDER — ONDANSETRON HCL 4 MG/2ML IJ SOLN
4.0000 mg | Freq: Once | INTRAMUSCULAR | Status: AC
  Administered 2012-02-25: 4 mg via INTRAVENOUS

## 2012-02-25 MED ORDER — MIDAZOLAM HCL 2 MG/2ML IJ SOLN
INTRAMUSCULAR | Status: AC
  Filled 2012-02-25: qty 2

## 2012-02-25 MED ORDER — LIDOCAINE HCL (PF) 1 % IJ SOLN
INTRAMUSCULAR | Status: AC
  Filled 2012-02-25: qty 5

## 2012-02-25 MED ORDER — STERILE WATER FOR IRRIGATION IR SOLN
Status: DC | PRN
  Administered 2012-02-25: 2000 mL

## 2012-02-25 MED ORDER — ONDANSETRON HCL 4 MG/2ML IJ SOLN
INTRAMUSCULAR | Status: AC
  Filled 2012-02-25: qty 2

## 2012-02-25 MED ORDER — FENTANYL CITRATE 0.05 MG/ML IJ SOLN
INTRAMUSCULAR | Status: DC | PRN
  Administered 2012-02-25: 25 ug via INTRAVENOUS

## 2012-02-25 MED ORDER — STERILE WATER FOR IRRIGATION IR SOLN
Status: DC | PRN
  Administered 2012-02-25: 08:00:00

## 2012-02-25 MED ORDER — FENTANYL CITRATE 0.05 MG/ML IJ SOLN
INTRAMUSCULAR | Status: AC
  Filled 2012-02-25: qty 2

## 2012-02-25 MED ORDER — LACTATED RINGERS IV SOLN
INTRAVENOUS | Status: DC
  Administered 2012-02-25: 1000 mL via INTRAVENOUS

## 2012-02-25 MED ORDER — FENTANYL CITRATE 0.05 MG/ML IJ SOLN: 25.0000 ug | INTRAMUSCULAR | Status: DC | PRN

## 2012-02-25 MED ORDER — ONDANSETRON HCL 4 MG/2ML IJ SOLN: 4.0000 mg | Freq: Once | INTRAMUSCULAR | Status: DC | PRN

## 2012-02-25 MED ORDER — GLYCOPYRROLATE 0.2 MG/ML IJ SOLN
0.2000 mg | Freq: Once | INTRAMUSCULAR | Status: AC
  Administered 2012-02-25: 0.2 mg via INTRAVENOUS

## 2012-02-25 MED ORDER — BUTAMBEN-TETRACAINE-BENZOCAINE 2-2-14 % EX AERO
1.0000 | INHALATION_SPRAY | Freq: Once | CUTANEOUS | Status: AC
  Administered 2012-02-25: 1 via TOPICAL
  Filled 2012-02-25: qty 56

## 2012-02-25 MED ORDER — GLYCOPYRROLATE 0.2 MG/ML IJ SOLN
INTRAMUSCULAR | Status: AC
  Filled 2012-02-25: qty 1

## 2012-02-25 MED ORDER — MIDAZOLAM HCL 2 MG/2ML IJ SOLN
1.0000 mg | INTRAMUSCULAR | Status: DC | PRN
  Administered 2012-02-25: 2 mg via INTRAVENOUS

## 2012-02-25 SURGICAL SUPPLY — 17 items
BLOCK BITE 60FR ADLT L/F BLUE (MISCELLANEOUS) ×2 IMPLANT
DEVICE CLIP HEMOSTAT 235CM (CLIP) IMPLANT
ELECT REM PT RETURN 9FT ADLT (ELECTROSURGICAL)
ELECTRODE REM PT RTRN 9FT ADLT (ELECTROSURGICAL) IMPLANT
FLOOR PAD 36X40 (MISCELLANEOUS) ×2
FORCEPS BIOP RAD 4 LRG CAP 4 (CUTTING FORCEPS) ×2 IMPLANT
MANIFOLD NEPTUNE II (INSTRUMENTS) ×2 IMPLANT
NEEDLE SCLEROTHERAPY 25GX240 (NEEDLE) IMPLANT
PAD FLOOR 36X40 (MISCELLANEOUS) ×1 IMPLANT
PROBE APC STR FIRE (PROBE) IMPLANT
PROBE INJECTION GOLD (MISCELLANEOUS)
PROBE INJECTION GOLD 7FR (MISCELLANEOUS) IMPLANT
SNARE ROTATE MED OVAL 20MM (MISCELLANEOUS) IMPLANT
SYR 50ML LL SCALE MARK (SYRINGE) ×2 IMPLANT
TUBING ENDO SMARTCAP PENTAX (MISCELLANEOUS) ×2 IMPLANT
TUBING IRRIGATION ENDOGATOR (MISCELLANEOUS) IMPLANT
WATER STERILE IRR 1000ML POUR (IV SOLUTION) ×4 IMPLANT

## 2012-02-25 NOTE — Transfer of Care (Signed)
Immediate Anesthesia Transfer of Care Note  Patient: [name]  Procedure(s) Performed: Procedure(s) (LRB) with comments: ESOPHAGOGASTRODUODENOSCOPY (EGD) WITH PROPOFOL (N/A) - 7:30 /POLYPHARMACY  Patient Location: PACU  Anesthesia Type:MAC  Level of Consciousness: awake, alert  and oriented  Airway & Oxygen Therapy: Patient Spontanous Breathing and Patient connected to face mask oxygen  Post-op Assessment: Report given to PACU RN  Post vital signs: Reviewed  Complications: No apparent anesthesia complications

## 2012-02-25 NOTE — Anesthesia Preprocedure Evaluation (Signed)
Anesthesia Evaluation  Patient identified by MRN, date of birth, ID band Patient awake    Reviewed: Allergy & Precautions, H&P , NPO status , Patient's Chart, lab work & pertinent test results  History of Anesthesia Complications Negative for: history of anesthetic complications  Airway Mallampati: IV TM Distance: >3 FB Neck ROM: Limited    Dental  (+) Poor Dentition and Partial Lower   Pulmonary sleep apnea , former smoker,  breath sounds clear to auscultation        Cardiovascular hypertension, Pt. on medications + angina with exertion + CAD, + Past MI and +CHF Rhythm:Regular     Neuro/Psych Seizures -, Well Controlled,  PSYCHIATRIC DISORDERS (episodes of prolonged sleepyness.) Anxiety Depression  Neuromuscular disease    GI/Hepatic hiatal hernia, GERD-  Medicated,  Endo/Other  diabetes, Type 2, Insulin Dependent  Renal/GU Renal InsufficiencyRenal disease     Musculoskeletal   Abdominal   Peds  Hematology   Anesthesia Other Findings   Reproductive/Obstetrics                           Anesthesia Physical Anesthesia Plan  ASA: III  Anesthesia Plan: MAC   Post-op Pain Management:    Induction: Intravenous  Airway Management Planned: Simple Face Mask  Additional Equipment:   Intra-op Plan:   Post-operative Plan:   Informed Consent: I have reviewed the patients History and Physical, chart, labs and discussed the procedure including the risks, benefits and alternatives for the proposed anesthesia with the patient or authorized representative who has indicated his/her understanding and acceptance.     Plan Discussed with:   Anesthesia Plan Comments:         Anesthesia Quick Evaluation

## 2012-02-25 NOTE — Op Note (Signed)
[facility] [address] [place], [address]   ENDOSCOPY PROCEDURE REPORT  PATIENT: Larry, [name]  MR#: [id number] BIRTHDATE: 02/15/1956 , 56  yrs. old GENDER: Male ENDOSCOPIST: R.  Roetta Sessions, MD FACP FACG REFERRED BY:  Cristie Hem, MD PROCEDURE DATE:  02/25/2012 PROCEDURE:     EGD with small bowel biopsy  INDICATIONS:      Nausea and vomiting; weight loss; failure to thrive  INFORMED CONSENT:   The risks, benefits, limitations, alternatives and imponderables have been discussed.  The potential for biopsy, esophogeal dilation, etc. have also been reviewed.  Questions have been answered.  All parties agreeable.  Please see the history and physical in the medical record for more information.  MEDICATIONS:      Deep sedation per Dr. [facility] and  DESCRIPTION OF PROCEDURE:   The     endoscope was introduced through the mouth and advanced to the second portion of the duodenum without difficulty or limitations.  The mucosal surfaces were surveyed very carefully during advancement of the scope and upon withdrawal.  Retroflexion view of the proximal stomach and esophagogastric junction was performed.      FINDINGS: Normal esophagus with accentuated undulating Z line. Stomach empty. Small hiatal hernia. Gastric mucosa normal.  patent pylorus. Normal duodenal bulb. Patient scattered erosions of the second portion of the duodenum. No scalloping. No ulcer.  THERAPEUTIC / DIAGNOSTIC MANEUVERS PERFORMED:  biopsies of the second and third portion of the duodenum taken for histologic study   COMPLICATIONS:  None  IMPRESSION:     Normal esophagus. Small hiatal hernia. Duodenal erosions of uncertain significance--status post biopsy. Prior transglutaminase IgA level negative but no total serum IgA level performed.  RECOMMENDATIONS:  Followup on pathology. Patient and spouse urged to keep appointment at [facility] in the near  future.    _______________________________ R. Roetta Sessions, MD FACP [facility] eSigned:  R. Roetta Sessions, MD FACP [facility] 02/25/2012 8:40 AM     CC:  PATIENT NAME:  Larry, [name] MR#: [id number]

## 2012-02-25 NOTE — Telephone Encounter (Signed)
What is status of heavy metal screening?

## 2012-02-25 NOTE — H&P (View-Only) (Signed)
Primary Care Physician: Cristie Hem, MD  Primary Gastroenterologist:  Roetta Sessions, MD   Chief Complaint  Patient presents with  . Follow-up  . Emesis    HPI: [name] is a 56 y.o. male here for f/u of recent hospitalization. He has h/o abnormal weight loss, chronic vomiting (gastroparesis). Last seen in office 05/2011. Weighed 234 lbs at that time. Weighed 306 lbs in 06/2010. Today weighs 211 lbs.  Patient no showed for appointment with Dr. Jena Gauss in 09/2011 (did not have money for copay). Recent admission (twice in 11/2011), for recurrent mental status changes. Normal EEG. Head CT X 2 in 11/2011 unremarkable. Ammonia level 29, H/H 12.5/37.5. Creatinine 1.7. Patient reports that the doctors are concerned that his medication is not being absorbed appropriately due to the gastroparesis.   Not vomiting as much as had been. Last three days worse than the last one month. Eating small meals. Eating mostly high calorie sweets, to "maintain my weight". No urge to eat. Fatigue. No heartburn. Postprandial upper abdominal pain and LUQ bulge pp. Feels like food getting stuck and eventually when moves down, he feels better. Urine drug screen negative in ED but at pain clinic oxycodone show up.  BM, tries every day for 30 minutes. Some days large stool. And then diarrhea for 3-4 days afterwards. Other times no stools. Takes Miralax daily. With vomiting, teeth are damaged. Vomiting things eaten two days ago. Not taking antiemetics usually. Will occasionally use phenergan suppositories. Takes oxycodone every four hours but states poor control of back pain. Out of pain medication as of today and cannot get any until next week.  Current Outpatient Prescriptions  Medication Sig Dispense Refill  . baclofen (LIORESAL) 5 mg TABS Take 0.5 tablets (5 mg total) by mouth 3 (three) times daily.  90 tablet  3  . clonazePAM (KLONOPIN) 0.5 MG tablet Take 0.5-1 mg by mouth 2 (two) times daily. 0.5 mg in the a.m. And 1 mg at  night      . docusate sodium (COLACE) 100 MG capsule Take 100-200 mg by mouth 2 (two) times daily. Take 2 tablets every morning and 1 tablet at bedtime      . FLUoxetine (PROZAC) 40 MG capsule Take 1 capsule (40 mg total) by mouth daily.  30 capsule  2  . furosemide (LASIX) 20 MG tablet Take 40 mg by mouth as needed.       . gabapentin (NEURONTIN) 400 MG capsule Take 400 mg by mouth 3 (three) times daily.      . hydrOXYzine (ATARAX/VISTARIL) 10 MG tablet Take 10 mg by mouth 3 (three) times daily as needed. For itching      . insulin NPH-insulin regular (NOVOLIN 70/30) (70-30) 100 UNIT/ML injection Inject 15 Units into the skin 2 (two) times daily with a meal.  10 mL    . Multiple Vitamins-Minerals (CENTRUM SILVER ULTRA MENS) TABS Take 1 tablet by mouth daily.        Marland Kitchen oxycodone (ROXICODONE) 30 MG immediate release tablet Take 30 mg by mouth every 4 (four) hours as needed.       . pantoprazole (PROTONIX) 40 MG tablet Take 40 mg by mouth daily.      . polyethylene glycol (MIRALAX / GLYCOLAX) packet Take 17 g by mouth daily.      Marland Kitchen rOPINIRole (REQUIP) 2 MG tablet Take 2 mg by mouth at bedtime.      . modafinil (PROVIGIL) 100 MG tablet Take 1 tablet (100 mg total) by mouth  daily. Please take daily for another 3 days, then increase to twice a day  60 tablet  3  . ondansetron (ZOFRAN-ODT) 4 MG disintegrating tablet Take 1 tablet (4 mg total) by mouth every 4 (four) hours as needed for nausea.  20 tablet  0  . promethazine (PHENERGAN) 25 MG suppository Place 1 suppository (25 mg total) rectally every 6 (six) hours as needed for nausea.  12 each  0  . promethazine (PHENERGAN) 25 MG suppository Place 1 suppository (25 mg total) rectally every 6 (six) hours as needed for nausea.  12 each  0    Allergies as of 01/27/2012  . (No Known Allergies)   Past Medical History  Diagnosis Date  . Diabetes mellitus   . Hypertension   . CHF (congestive heart failure)   . CRD (chronic renal disease)   . Arthritis    . Obesity   . Depression   . Sleep apnea   . Diabetic neuropathy   . CAD (coronary artery disease)   . Mitral regurgitation   . Esophagitis, reflux 07/07/10    MW tear, erosive reflux esophagitis, 2cm hh  . S/P colonoscopy April 2012    Rourk: torturous colon, hyperplastic polyps  . Collagen vascular disease     "myoclonic "  . Tremor   . Diverticulum   . Hiatal hernia   . GERD (gastroesophageal reflux disease)     erosive  . Gastroparesis 11/2010    No emptying at two hours.    Past Surgical History  Procedure Date  . Neck surgery     discectomy  . Hand surgery     bilateral  . Colonoscopy before 2002    [place]  . Appendectomy   . Other surgical history     bone graft-knee  . Anterior lumbar corpectomy w/ fusion     C5-6/C4-7  . Colonoscopy 08/21/2010    elongaed tortuous colon otherwise normal  . Esophagogastroduodenoscopy 07/08/10    Dr. Marny Lowenstein esophageal erosions and excoriations consistent with erosive reflux esophagitis, hiatal hernia   Family History  Problem Relation Age of Onset  . Stroke Father 14  . Stroke Mother 33  . Colon cancer Neg Hx   . Crohn's disease Neg Hx   . Cirrhosis Neg Hx   . Ulcerative colitis Neg Hx   . Stomach cancer Neg Hx    History   Social History  . Marital Status: Married    Spouse Name: N/A    Number of Children: 1  . Years of Education: N/A   Occupational History  . disabled    Social History Main Topics  . Smoking status: Current Every Day Smoker -- 0.5 packs/day for 40 years    Types: Cigarettes  . Smokeless tobacco: Never Used   Comment: ordered nicotine patches. start date is 27 July 2010  . Alcohol Use: No  . Drug Use: No  . Sexually Active: Yes     erectile dysfunction   Other Topics Concern  . None   Social History Narrative   Lost one dgt due to homicide.     ROS:  General:see hpi ENT: Negative for hoarseness, difficulty swallowing , nasal congestion. CV: Negative for chest pain, angina,  palpitations, dyspnea on exertion, peripheral edema.  Respiratory: Negative for dyspnea at rest, dyspnea on exertion, cough, sputum, wheezing.  GI: See history of present illness. GU:  Negative for dysuria, hematuria, urinary incontinence, urinary frequency, nocturnal urination.  Endo: see hpi    Physical Examination:  BP 99/62  Pulse 66  Temp 97.6 F (36.4 C) (Temporal)  Ht 6' (1.829 m)  Wt 211 lb 6.4 oz (95.89 kg)  BMI 28.67 kg/m2  General: Well-nourished, well-developed in no acute distress. Accompanied by wife.  Eyes: No icterus. Mouth: Oropharyngeal mucosa moist and pink , no lesions erythema or exudate. Dentition in poor repair.  Lungs: Clear to auscultation bilaterally.  Heart: Regular rate and rhythm, no murmurs rubs or gallops.  Abdomen: Bowel sounds are normal, epigastric tenderness, nondistended, no hepatosplenomegaly or masses, no abdominal bruits or hernia , no rebound or guarding.   Extremities: No lower extremity edema. No clubbing or deformities. Neuro: Alert and oriented x 4   Skin: Warm and dry, no jaundice.   Psych: Alert and cooperative, normal mood and affect.  Labs:  Lab Results  Component Value Date   CREATININE 1.60* 12/23/2011   BUN 28* 12/23/2011   NA 141 12/23/2011   K 3.9 12/23/2011   CL 104 12/23/2011   CO2 28 12/23/2011   Lab Results  Component Value Date   WBC 8.0 12/21/2011   HGB 12.5* 12/21/2011   HCT 37.5* 12/21/2011   MCV 86.4 12/21/2011   PLT 194 12/21/2011   Lab Results  Component Value Date   ALT 7 12/20/2011   AST 13 12/20/2011   ALKPHOS 74 12/20/2011   BILITOT 0.3 12/20/2011    Imaging Studies: No results found.

## 2012-02-25 NOTE — Interval H&P Note (Signed)
History and Physical Interval Note:  02/25/2012 7:41 AM  [name]  has presented today for surgery, with the diagnosis of GASTROPARESIS, NAUSEA AND VOMITING, ABNORMAL WEIGHT LOSS EPIGASTRIC PAIN  The various methods of treatment have been discussed with the patient and family. After consideration of risks, benefits and other options for treatment, the patient has consented to  Procedure(s) (LRB) with comments: ESOPHAGOGASTRODUODENOSCOPY (EGD) WITH PROPOFOL (N/A) - 7:30 /POLYPHARMACY as a surgical intervention .  The patient's history has been reviewed, patient examined, no change in status, stable for surgery.  I have reviewed the patient's chart and labs.  Questions were answered to the patient's satisfaction.     Eula Listen

## 2012-02-25 NOTE — Anesthesia Postprocedure Evaluation (Signed)
  Anesthesia Post-op Note  Patient: [name]  Procedure(s) Performed: Procedure(s) (LRB) with comments: ESOPHAGOGASTRODUODENOSCOPY (EGD) WITH PROPOFOL (N/A) - 7:30 /POLYPHARMACY  Patient Location: PACU  Anesthesia Type:MAC  Level of Consciousness: awake and alert   Airway and Oxygen Therapy: Patient Spontanous Breathing and Patient connected to face mask oxygen  Post-op Pain: none  Post-op Assessment: Post-op Vital signs reviewed, Patient's Cardiovascular Status Stable, Respiratory Function Stable and Patent Airway  Post-op Vital Signs: Reviewed and stable  Complications: No apparent anesthesia complications

## 2012-02-26 NOTE — Telephone Encounter (Signed)
Quick Note:  Please let pt know his heavy metal screen is negative for arsenic, lead, mercury. ______

## 2012-02-27 ENCOUNTER — Encounter: Payer: Self-pay | Admitting: Internal Medicine

## 2012-02-29 ENCOUNTER — Encounter: Payer: Self-pay | Admitting: *Deleted

## 2012-03-01 LAB — HEAVY METALS SCREEN, URINE
Arsenic, 24H Ur: <2 mcg/L (ref <81)
Lead, Urine (24 Hr): <1 mcg/L (ref <80)
Mercury 24 Hr Urine: <2 mcg/L (ref <21)

## 2012-03-01 NOTE — Telephone Encounter (Signed)
Faxed to PCP

## 2012-03-01 NOTE — Progress Notes (Signed)
Faxed to PCP

## 2012-03-31 ENCOUNTER — Encounter (HOSPITAL_COMMUNITY): Payer: Self-pay | Admitting: Emergency Medicine

## 2012-03-31 ENCOUNTER — Emergency Department (HOSPITAL_COMMUNITY)
Admission: EM | Admit: 2012-03-31 | Discharge: 2012-03-31 | Disposition: A | Discharge status: Home / Self Care | Payer: Medicare Other | Attending: Emergency Medicine | Admitting: Emergency Medicine

## 2012-03-31 DIAGNOSIS — G8929 Other chronic pain: Secondary | ICD-10-CM

## 2012-03-31 DIAGNOSIS — Z8669 Personal history of other diseases of the nervous system and sense organs: Secondary | ICD-10-CM | POA: Insufficient documentation

## 2012-03-31 DIAGNOSIS — G4733 Obstructive sleep apnea (adult) (pediatric): Secondary | ICD-10-CM | POA: Insufficient documentation

## 2012-03-31 DIAGNOSIS — Z8719 Personal history of other diseases of the digestive system: Secondary | ICD-10-CM | POA: Insufficient documentation

## 2012-03-31 DIAGNOSIS — Z8739 Personal history of other diseases of the musculoskeletal system and connective tissue: Secondary | ICD-10-CM | POA: Insufficient documentation

## 2012-03-31 DIAGNOSIS — I10 Essential (primary) hypertension: Secondary | ICD-10-CM | POA: Insufficient documentation

## 2012-03-31 DIAGNOSIS — Z79899 Other long term (current) drug therapy: Secondary | ICD-10-CM | POA: Insufficient documentation

## 2012-03-31 DIAGNOSIS — E669 Obesity, unspecified: Secondary | ICD-10-CM | POA: Insufficient documentation

## 2012-03-31 DIAGNOSIS — Z794 Long term (current) use of insulin: Secondary | ICD-10-CM | POA: Insufficient documentation

## 2012-03-31 DIAGNOSIS — Z8659 Personal history of other mental and behavioral disorders: Secondary | ICD-10-CM | POA: Insufficient documentation

## 2012-03-31 DIAGNOSIS — I509 Heart failure, unspecified: Secondary | ICD-10-CM | POA: Insufficient documentation

## 2012-03-31 DIAGNOSIS — N189 Chronic kidney disease, unspecified: Secondary | ICD-10-CM | POA: Insufficient documentation

## 2012-03-31 DIAGNOSIS — I2510 Atherosclerotic heart disease of native coronary artery without angina pectoris: Secondary | ICD-10-CM | POA: Insufficient documentation

## 2012-03-31 DIAGNOSIS — E1149 Type 2 diabetes mellitus with other diabetic neurological complication: Secondary | ICD-10-CM | POA: Insufficient documentation

## 2012-03-31 DIAGNOSIS — Z8679 Personal history of other diseases of the circulatory system: Secondary | ICD-10-CM | POA: Insufficient documentation

## 2012-03-31 DIAGNOSIS — F17200 Nicotine dependence, unspecified, uncomplicated: Secondary | ICD-10-CM | POA: Insufficient documentation

## 2012-03-31 NOTE — ED Notes (Signed)
Pt pain med pcp at [facility] changed meds last week. States he sent pt here for pain assessment to increase pain meds. Nad. Alert/oriented. C/o chronic neck/back pain.

## 2012-03-31 NOTE — ED Notes (Signed)
Has not checked bs today. Pt slightly groggy. Slow moving

## 2012-03-31 NOTE — ED Notes (Signed)
Toni Amend called from [facility] to notify us of pt arrival. Pain clinic is fearful of pt having withdrawal symptoms from changing their meds around. She stated if there were questions you could reach her at [phone] and press option 3 for nurse.

## 2012-03-31 NOTE — ED Provider Notes (Signed)
History     CSN: [id number]  Arrival date & time 03/31/12  1018   First MD Initiated Contact with Patient 03/31/12 1042      Chief Complaint  Patient presents with  . Neck Pain    "pain assessment"    (Consider location/radiation/quality/duration/timing/severity/associated sxs/prior treatment) The history is provided by the patient (outside records from [facility], care everywhere).  the patient reports he is currently off his chronic pain medicines and reports increasing pain.  He spoke with his chronic pain specialist in [facility] Dr. Myna Hidalgo, who per the notes was under the understanding that the patient was in narcotic withdrawal and requested that he go to the nearest emergency department for treatment of his narcotic withdrawal.  The patient reports he was sent to the emergency department by his chronic pain specialist for a "pain assessment". Patient states he needs pain medications.  The majority of pain is in his neck.  No weakness of his upper lower extremities.  He reports his pain is consistent with his prior chronic pain.  No new symptoms.  No new trauma.  Past Medical History  Diagnosis Date  . Diabetes mellitus   . Hypertension   . CHF (congestive heart failure)   . CRD (chronic renal disease)   . Arthritis   . Obesity   . Depression   . Sleep apnea   . Diabetic neuropathy   . CAD (coronary artery disease)   . Mitral regurgitation   . Esophagitis, reflux 07/07/10    MW tear, erosive reflux esophagitis, 2cm hh  . S/P colonoscopy April 2012    Rourk: torturous colon, hyperplastic polyps  . Collagen vascular disease     "myoclonic "  . Tremor   . Diverticulum   . Hiatal hernia   . GERD (gastroesophageal reflux disease)     erosive  . Gastroparesis 11/2010    No emptying at two hours.   . Chronic back pain   . Clonic seizure     controlled with klonodine    Past Surgical History  Procedure Date  . Neck surgery     discectomy  . Hand  surgery     right-carpal tunnel  . Colonoscopy before 2002    [place]  . Appendectomy   . Other surgical history     bone graft-knee  . Anterior lumbar corpectomy w/ fusion     C5-6/C4-7  . Colonoscopy 08/21/2010    elongaed tortuous colon otherwise normal  . Esophagogastroduodenoscopy 07/08/10    Dr. Marny Lowenstein esophageal erosions and excoriations consistent with erosive reflux esophagitis, hiatal hernia  . Knee arthroscopy     right  . Esophagogastroduodenoscopy (egd) with propofol 02/25/2012    Procedure: ESOPHAGOGASTRODUODENOSCOPY (EGD) WITH PROPOFOL;  Surgeon: Corbin Ade, MD;  Location: [facility];  Service: Endoscopy;  Laterality: N/A;  7:30 /POLYPHARMACY    Family History  Problem Relation Age of Onset  . Stroke Father 20  . Stroke Mother 72  . Colon cancer Neg Hx   . Crohn's disease Neg Hx   . Cirrhosis Neg Hx   . Ulcerative colitis Neg Hx   . Stomach cancer Neg Hx     History  Substance Use Topics  . Smoking status: Current Every Day Smoker -- 0.5 packs/day for 40 years    Types: Cigarettes  . Smokeless tobacco: Never Used     Comment: ordered nicotine patches. start date is 27 July 2010  . Alcohol Use: No  Review of Systems  All other systems reviewed and are negative.    Allergies  Review of patient's allergies indicates no known allergies.  Home Medications   Current Outpatient Rx  Name  Route  Sig  Dispense  Refill  . BACLOFEN 5 MG HALF TABLET   Oral   Take 0.5 tablets (5 mg total) by mouth 3 (three) times daily.   90 tablet   3   . CLONAZEPAM 0.5 MG PO TABS   Oral   Take 0.5-1 mg by mouth 2 (two) times daily. 0.5 mg in the a.m. And 1 mg at night         . DICYCLOMINE HCL 10 MG PO CAPS   Oral   Take 1 capsule (10 mg total) by mouth 3 (three) times daily as needed (hold for constipation, use sparingly as can slow down stomach emptying).   90 capsule   0   . FLUOXETINE HCL 40 MG PO CAPS   Oral   Take 40 mg by mouth 2 (two) times  daily.         . FUROSEMIDE 20 MG PO TABS   Oral   Take 40 mg by mouth daily as needed. For fluid         . GABAPENTIN 400 MG PO CAPS   Oral   Take 400 mg by mouth 3 (three) times daily.         Marland Kitchen HYDROXYZINE HCL 10 MG PO TABS   Oral   Take 10 mg by mouth 3 (three) times daily as needed. For itching         . INSULIN ASPART PROT & ASPART (70-30) 100 UNIT/ML [place] SUSP   Subcutaneous   Inject 10-20 Units into the skin 2 (two) times daily with a meal. Units vary based on food consumed.         Marland Kitchen MODAFINIL 100 MG PO TABS   Oral   Take 1 tablet (100 mg total) by mouth daily. Please take daily for another 3 days, then increase to twice a day   60 tablet   3   . MODAFINIL 200 MG PO TABS   Oral   Take 100 mg by mouth 2 (two) times daily.         . CENTRUM SILVER ULTRA MENS PO TABS   Oral   Take 1 tablet by mouth daily.           Marland Kitchen ONDANSETRON 4 MG PO TBDP   Oral   Take 1 tablet (4 mg total) by mouth every 4 (four) hours as needed for nausea.   20 tablet   0   . PANTOPRAZOLE SODIUM 40 MG PO TBEC   Oral   Take 40 mg by mouth daily.         Marland Kitchen POLYETHYLENE GLYCOL 3350 PO PACK   Oral   Take 17 g by mouth daily as needed. For constipation         . PROMETHAZINE HCL 25 MG RE SUPP   Rectal   Place 1 suppository (25 mg total) rectally every 6 (six) hours as needed for nausea.   12 each   0   . PROMETHAZINE HCL 25 MG PO TABS   Oral   Take 25 mg by mouth every 6 (six) hours as needed. For nausea         . ROPINIROLE HCL 2 MG PO TABS   Oral   Take 4 mg by mouth at bedtime.          Marland Kitchen  TRAMADOL HCL 50 MG PO TABS   Oral   Take 100 mg by mouth every 6 (six) hours as needed.           BP 142/75  Pulse 96  Temp 97.9 F (36.6 C) (Oral)  Resp 16  Ht 6' (1.829 m)  Wt 220 lb (99.791 kg)  BMI 29.84 kg/m2  SpO2 98%  Physical Exam  Constitutional: He is oriented to person, place, and time. He appears well-developed and well-nourished.  HENT:  Head:  Normocephalic.  Eyes: EOM are normal.  Neck: Normal range of motion.  Pulmonary/Chest: Effort normal.  Abdominal: He exhibits no distension.  Musculoskeletal: Normal range of motion.  Neurological: He is alert and oriented to person, place, and time.  Psychiatric: He has a normal mood and affect.    ED Course  Procedures (including critical care time)  Labs Reviewed - No data to display No results found.   1. Chronic pain       MDM  Chronic pain.  No evidence of life-threatening emergency.  Referral back to pain management        Lyanne Co, MD 03/31/12 1102

## 2012-05-06 ENCOUNTER — Encounter (HOSPITAL_COMMUNITY): Payer: Self-pay

## 2012-05-06 ENCOUNTER — Emergency Department (HOSPITAL_COMMUNITY)
Admission: EM | Admit: 2012-05-06 | Discharge: 2012-05-07 | Disposition: A | Discharge status: Other Acute Inpatient Hospital | Payer: Medicare Other | Attending: Emergency Medicine | Admitting: Emergency Medicine

## 2012-05-06 ENCOUNTER — Emergency Department (HOSPITAL_COMMUNITY): Payer: Medicare Other

## 2012-05-06 DIAGNOSIS — Z860100 Personal history of colon polyps, unspecified: Secondary | ICD-10-CM | POA: Insufficient documentation

## 2012-05-06 DIAGNOSIS — Z8679 Personal history of other diseases of the circulatory system: Secondary | ICD-10-CM | POA: Insufficient documentation

## 2012-05-06 DIAGNOSIS — Z79899 Other long term (current) drug therapy: Secondary | ICD-10-CM | POA: Insufficient documentation

## 2012-05-06 DIAGNOSIS — K2100 Gastro-esophageal reflux disease with esophagitis, without bleeding: Secondary | ICD-10-CM | POA: Insufficient documentation

## 2012-05-06 DIAGNOSIS — Z8719 Personal history of other diseases of the digestive system: Secondary | ICD-10-CM | POA: Insufficient documentation

## 2012-05-06 DIAGNOSIS — R079 Chest pain, unspecified: Secondary | ICD-10-CM | POA: Insufficient documentation

## 2012-05-06 DIAGNOSIS — R0609 Other forms of dyspnea: Secondary | ICD-10-CM | POA: Insufficient documentation

## 2012-05-06 DIAGNOSIS — G40309 Generalized idiopathic epilepsy and epileptic syndromes, not intractable, without status epilepticus: Secondary | ICD-10-CM | POA: Insufficient documentation

## 2012-05-06 DIAGNOSIS — M129 Arthropathy, unspecified: Secondary | ICD-10-CM | POA: Insufficient documentation

## 2012-05-06 DIAGNOSIS — G4730 Sleep apnea, unspecified: Secondary | ICD-10-CM | POA: Insufficient documentation

## 2012-05-06 DIAGNOSIS — E669 Obesity, unspecified: Secondary | ICD-10-CM | POA: Insufficient documentation

## 2012-05-06 DIAGNOSIS — Z794 Long term (current) use of insulin: Secondary | ICD-10-CM | POA: Insufficient documentation

## 2012-05-06 DIAGNOSIS — K5730 Diverticulosis of large intestine without perforation or abscess without bleeding: Secondary | ICD-10-CM | POA: Insufficient documentation

## 2012-05-06 DIAGNOSIS — M549 Dorsalgia, unspecified: Secondary | ICD-10-CM | POA: Insufficient documentation

## 2012-05-06 DIAGNOSIS — Z8669 Personal history of other diseases of the nervous system and sense organs: Secondary | ICD-10-CM | POA: Insufficient documentation

## 2012-05-06 DIAGNOSIS — I509 Heart failure, unspecified: Secondary | ICD-10-CM

## 2012-05-06 DIAGNOSIS — F329 Major depressive disorder, single episode, unspecified: Secondary | ICD-10-CM | POA: Insufficient documentation

## 2012-05-06 DIAGNOSIS — F17200 Nicotine dependence, unspecified, uncomplicated: Secondary | ICD-10-CM | POA: Insufficient documentation

## 2012-05-06 DIAGNOSIS — E1149 Type 2 diabetes mellitus with other diabetic neurological complication: Secondary | ICD-10-CM | POA: Insufficient documentation

## 2012-05-06 DIAGNOSIS — F3289 Other specified depressive episodes: Secondary | ICD-10-CM | POA: Insufficient documentation

## 2012-05-06 DIAGNOSIS — I2510 Atherosclerotic heart disease of native coronary artery without angina pectoris: Secondary | ICD-10-CM | POA: Insufficient documentation

## 2012-05-06 DIAGNOSIS — R0600 Dyspnea, unspecified: Secondary | ICD-10-CM

## 2012-05-06 DIAGNOSIS — I129 Hypertensive chronic kidney disease with stage 1 through stage 4 chronic kidney disease, or unspecified chronic kidney disease: Secondary | ICD-10-CM | POA: Insufficient documentation

## 2012-05-06 DIAGNOSIS — R0989 Other specified symptoms and signs involving the circulatory and respiratory systems: Secondary | ICD-10-CM | POA: Insufficient documentation

## 2012-05-06 DIAGNOSIS — G8929 Other chronic pain: Secondary | ICD-10-CM | POA: Insufficient documentation

## 2012-05-06 DIAGNOSIS — E1142 Type 2 diabetes mellitus with diabetic polyneuropathy: Secondary | ICD-10-CM | POA: Insufficient documentation

## 2012-05-06 DIAGNOSIS — K3184 Gastroparesis: Secondary | ICD-10-CM | POA: Insufficient documentation

## 2012-05-06 NOTE — ED Notes (Signed)
Pt c/o chest pain that started 2 hours prior and has been SOB for the past day. Pt had recent hospitalization at [facility] for CHF.

## 2012-05-07 LAB — COMPREHENSIVE METABOLIC PANEL
ALT: 12 U/L (ref 0–53)
AST: 15 U/L (ref 0–37)
Albumin: 3.6 g/dL (ref 3.5–5.2)
Alkaline Phosphatase: 86 U/L (ref 39–117)
BUN: 29 mg/dL — ABNORMAL HIGH (ref 6–23)
Chloride: 100 mEq/L (ref 96–112)
Potassium: 4.4 mEq/L (ref 3.5–5.1)
Sodium: 137 mEq/L (ref 135–145)
Total Bilirubin: 0.2 mg/dL — ABNORMAL LOW (ref 0.3–1.2)
Total Protein: 7.4 g/dL (ref 6.0–8.3)

## 2012-05-07 LAB — TROPONIN I: Troponin I: <0.3 ng/mL (ref <0.30)

## 2012-05-07 LAB — CBC WITH DIFFERENTIAL/PLATELET
Basophils Absolute: 0.1 10*3/uL (ref 0.0–0.1)
Basophils Relative: 1 % (ref 0–1)
Eosinophils Absolute: 0.3 10*3/uL (ref 0.0–0.7)
Hemoglobin: 12.1 g/dL — ABNORMAL LOW (ref 13.0–17.0)
MCH: 29.3 pg (ref 26.0–34.0)
MCHC: 33.6 g/dL (ref 30.0–36.0)
Monocytes Relative: 9 % (ref 3–12)
Neutro Abs: 3.7 10*3/uL (ref 1.7–7.7)
Neutrophils Relative %: 59 % (ref 43–77)
Platelets: 202 10*3/uL (ref 150–400)

## 2012-05-07 LAB — GLUCOSE, CAPILLARY: Glucose-Capillary: 123 mg/dL — ABNORMAL HIGH (ref 70–99)

## 2012-05-07 MED ORDER — FUROSEMIDE 10 MG/ML IJ SOLN
40.0000 mg | Freq: Once | INTRAMUSCULAR | Status: AC
  Administered 2012-05-07: 40 mg via INTRAVENOUS
  Filled 2012-05-07: qty 4

## 2012-05-07 NOTE — ED Notes (Signed)
[facility] notified of transfer, advises that they would sent an unit when they have one available,

## 2012-05-07 NOTE — ED Notes (Signed)
Pt updated on plan of care, pt pain free at present,

## 2012-05-07 NOTE — ED Notes (Signed)
Pt updated on plan of care, still denies any pain,

## 2012-05-07 NOTE — ED Notes (Signed)
Report given to [facility] 

## 2012-05-07 NOTE — ED Notes (Signed)
Pt voiding with no difficulty.  VS stable. No distress noted.

## 2012-05-07 NOTE — ED Provider Notes (Signed)
History     CSN: [id number]  Arrival date & time 05/06/12  2304   First MD Initiated Contact with Patient 05/07/12 0029      Chief Complaint  Patient presents with  . Chest Pain  . Shortness of Breath   Dr Raenette Rover - new PCP at [facility]  (Consider location/radiation/quality/duration/timing/severity/associated sxs/prior treatment) HPIForrest R [name] is a 57 y.o. 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 Patient had some home nursing-this fell through because they were not licensed in the county he [place] in. Patient's wife is a respiratory therapist, she says that he has been desaturating at night. Says his saturations over the course of this evening or in the 80s when he would fall asleep. CPAP failed to he says this afternoon, he became more and more short of breath, he's had some chest pain, is a pressure-type sensation throughout the front of his chest. It is not associated with nausea vomiting or diarrhea, no fevers or chills today. No productive cough. He is a smoker who recently quit on his first hospitalization he previously a one pack per days no curve for 45 years. Patient has some mild dull left upper quadrant pain.  Patient has had also, more frequent palpitations today.    Past Medical History  Diagnosis Date  . Diabetes mellitus   . Hypertension   . CHF (congestive heart failure)   . CRD (chronic renal disease)   . Arthritis   . Obesity   . Depression   . Sleep apnea   . Diabetic neuropathy   . CAD (coronary artery disease)   . Mitral regurgitation   . Esophagitis, reflux 07/07/10    MW tear, erosive reflux esophagitis, 2cm hh  . S/P colonoscopy April 2012    Rourk: torturous colon, hyperplastic polyps    . Collagen vascular disease     "myoclonic "  . Tremor   . Diverticulum   . Hiatal hernia   . GERD (gastroesophageal reflux disease)     erosive  . Gastroparesis 11/2010    No emptying at two hours.   . Chronic back pain   . Clonic seizure     controlled with klonodine    Past Surgical History  Procedure Date  . Neck surgery     discectomy  . Hand surgery     right-carpal tunnel  . Colonoscopy before 2002    [place]  . Appendectomy   . Other surgical history     bone graft-knee  . Anterior lumbar corpectomy w/ fusion     C5-6/C4-7  . Colonoscopy 08/21/2010    elongaed tortuous colon otherwise normal  . Esophagogastroduodenoscopy 07/08/10    Dr. Marny Lowenstein esophageal erosions and excoriations consistent with erosive reflux esophagitis, hiatal hernia  . Knee arthroscopy     right  . Esophagogastroduodenoscopy (egd) with propofol 02/25/2012    Procedure: ESOPHAGOGASTRODUODENOSCOPY (EGD) WITH PROPOFOL;  Surgeon: Corbin Ade, MD;  Location: [facility];  Service: Endoscopy;  Laterality: N/A;  7:30 /POLYPHARMACY    Family History  Problem Relation Age of Onset  . Stroke Father 36  . Stroke Mother 80  . Colon cancer Neg Hx   . Crohn's disease Neg Hx   . Cirrhosis Neg Hx   .  Ulcerative colitis Neg Hx   . Stomach cancer Neg Hx     History  Substance Use Topics  . Smoking status: Current Every Day Smoker -- 0.5 packs/day for 40 years    Types: Cigarettes  . Smokeless tobacco: Never Used     Comment: ordered nicotine patches. start date is 27 July 2010  . Alcohol Use: No      Review of Systems At least 10pt or greater review of systems completed and are negative except where specified in the HPI.  Allergies  Review of patient's allergies indicates no known allergies.  Home Medications   Current Outpatient Rx  Name  Route  Sig  Dispense  Refill  . BACLOFEN 5 MG HALF TABLET   Oral   Take 0.5 tablets (5 mg total) by mouth 3 (three) times daily.   90 tablet    3   . CLONAZEPAM 0.5 MG PO TABS   Oral   Take 0.5-1 mg by mouth 2 (two) times daily. 0.5 mg in the a.m. And 1 mg at night         . DICYCLOMINE HCL 10 MG PO CAPS   Oral   Take 1 capsule (10 mg total) by mouth 3 (three) times daily as needed (hold for constipation, use sparingly as can slow down stomach emptying).   90 capsule   0   . FLUOXETINE HCL 40 MG PO CAPS   Oral   Take 40 mg by mouth 2 (two) times daily.         . FUROSEMIDE 20 MG PO TABS   Oral   Take 40 mg by mouth daily as needed. For fluid         . GABAPENTIN 400 MG PO CAPS   Oral   Take 400 mg by mouth 3 (three) times daily.         Marland Kitchen HYDROXYZINE HCL 10 MG PO TABS   Oral   Take 10 mg by mouth 3 (three) times daily as needed. For itching         . INSULIN ASPART PROT & ASPART (70-30) 100 UNIT/ML [place] SUSP   Subcutaneous   Inject 10-20 Units into the skin 2 (two) times daily with a meal. Units vary based on food consumed.         Marland Kitchen MODAFINIL 100 MG PO TABS   Oral   Take 1 tablet (100 mg total) by mouth daily. Please take daily for another 3 days, then increase to twice a day   60 tablet   3   . MODAFINIL 200 MG PO TABS   Oral   Take 100 mg by mouth 2 (two) times daily.         . CENTRUM SILVER ULTRA MENS PO TABS   Oral   Take 1 tablet by mouth daily.           Marland Kitchen ONDANSETRON 4 MG PO TBDP   Oral   Take 1 tablet (4 mg total) by mouth every 4 (four) hours as needed for nausea.   20 tablet   0   . PANTOPRAZOLE SODIUM 40 MG PO TBEC   Oral   Take 40 mg by mouth daily.         Marland Kitchen POLYETHYLENE GLYCOL 3350 PO PACK   Oral   Take 17 g by mouth daily as needed. For constipation         . PROMETHAZINE HCL 25 MG RE SUPP   Rectal  Place 1 suppository (25 mg total) rectally every 6 (six) hours as needed for nausea.   12 each   0   . PROMETHAZINE HCL 25 MG PO TABS   Oral   Take 25 mg by mouth every 6 (six) hours as needed. For nausea         . ROPINIROLE HCL 2 MG PO TABS   Oral    Take 4 mg by mouth at bedtime.          . TRAMADOL HCL 50 MG PO TABS   Oral   Take 100 mg by mouth every 6 (six) hours as needed.           BP 156/81  Pulse 78  Temp 98.9 F (37.2 C) (Oral)  Resp 13  Ht 6' (1.829 m)  Wt 230 lb (104.327 kg)  BMI 31.19 kg/m2  SpO2 97%  Physical Exam  Nursing notes reviewed.  Electronic medical record reviewed. VITAL SIGNS:   Filed Vitals:   05/07/12 0500 05/07/12 0600 05/07/12 0729 05/07/12 0751  BP: 132/64 136/64 131/66 130/65  Pulse: 65  56 60  Temp:    98.2 F (36.8 C)  TempSrc:      Resp: 12 11 12 16   Height:      Weight:      SpO2: 97%  93% 95%   CONSTITUTIONAL: Awake, oriented, appears non-toxic HENT: Atraumatic, normocephalic, oral mucosa pink and moist, airway patent. Nares patent without drainage. External ears normal. EYES: Conjunctiva clear, EOMI, PERRLA NECK: Trachea midline, non-tender, supple CARDIOVASCULAR: Normal heart rate, Normal rhythm, No murmurs, rubs, gallops PULMONARY/CHEST: Clear to auscultation, no rhonchi, wheezes, or rales. Symmetrical breath sounds. Non-tender. ABDOMINAL: Non-distended, soft, non-tender - no rebound or guarding.  BS normal. NEUROLOGIC: Non-focal, moving all four extremities, no gross sensory or motor deficits. EXTREMITIES: No clubbing, cyanosis. 1+ nonpitting edema SKIN: Warm, Dry, No erythema, No rash  ED Course  Procedures (including critical care time)  Date: 05/07/2012  Rate: 79  Rhythm: normal sinus rhythm  QRS Axis: normal  Intervals: normal  ST/T Wave abnormalities: normal  Conduction Disutrbances: none  Narrative Interpretation: Probable septal infarct age undetermined, no acute ischemia or infarction no significant change from prior EKG;    Date: 05/07/2012  Rate: 65  Rhythm: normal sinus rhythm  QRS Axis: normal  Intervals: normal  ST/T Wave abnormalities: normal  Conduction Disutrbances: Left axis deviation  Narrative Interpretation: Probable septal infarction age  undetermined, no acute ischemia or infarction. Left axis deviation is evident on this EKG -   Labs Reviewed  CBC WITH DIFFERENTIAL - Abnormal; Notable for the following:    RBC 4.13 (*)     Hemoglobin 12.1 (*)     HCT 36.0 (*)     All other components within normal limits  COMPREHENSIVE METABOLIC PANEL - Abnormal; Notable for the following:    Glucose, Bld 196 (*)     BUN 29 (*)     Creatinine, Ser 1.74 (*)     Total Bilirubin 0.2 (*)     GFR calc non Af Amer 42 (*)     GFR calc Af Amer 48 (*)     All other components within normal limits  PRO B NATRIURETIC PEPTIDE - Abnormal; Notable for the following:    Pro B Natriuretic peptide (BNP) 3862.0 (*)     All other components within normal limits  TROPONIN I   Dg Chest 2 View  05/07/2012  *RADIOLOGY REPORT*  Clinical Data: Chest pain,  shortness of breath.  CHEST - 2 VIEW  Comparison: 12/20/2011  Findings: Heart and mediastinal contours are within normal limits. No focal opacities or effusions.  No acute bony abnormality.  IMPRESSION: No active cardiopulmonary disease.   Original Report Authenticated By: Charlett Nose, M.D.      1. CHF exacerbation   2. Dyspnea   3. Chest pain     MDM  [name] is a 57 y.o. male presenting with shortness of breath, desaturations at night. Concern for worsening CHF exacerbation. Patient has inadequate care at home. Doubt ACS at this time, will obtain labs and cardiac biomarkers as well as chest x-ray. Lungs are clear at this time. No immediate intervention for shortness of breath-patient is breathing comfortably on 2 L by nasal cannula.  CMP is about the patient's baseline with a GFR of 48. Patient's BNP is elevated at 3862 which is over his baseline about thousand. Troponin is negative, EKG shows no acute ischemia or infarction, no elevated white count. Chest x-ray is nonacute.  Patient is still having sensation of shortness of breath, 70 occasional PVCs seen on EKG as well as on the monitor.  Patient does not feel safe to go home, has no oxygen supply at home and no home nursing.  Discussed with [facility] internal medicine service for transfer to [facility] for readmission for CHF exacerbation.  Patient is stable and ready for transfer.          Jones Skene, MD 05/07/12 [phone]

## 2012-05-07 NOTE — ED Notes (Signed)
Report given to Magazine features editor at [facility] for room 719

## 2012-05-07 NOTE — ED Notes (Signed)
Pt resting, will arouse when spoke to,

## 2012-05-07 NOTE — ED Notes (Signed)
[facility] here to transport pt,  

## 2012-08-13 ENCOUNTER — Emergency Department (HOSPITAL_COMMUNITY)
Admission: EM | Admit: 2012-08-13 | Discharge: 2012-08-13 | Disposition: A | Discharge status: Home / Self Care | Payer: Medicare Other | Attending: Emergency Medicine | Admitting: Emergency Medicine

## 2012-08-13 ENCOUNTER — Encounter (HOSPITAL_COMMUNITY): Payer: Self-pay

## 2012-08-13 ENCOUNTER — Emergency Department (HOSPITAL_COMMUNITY): Payer: Medicare Other

## 2012-08-13 DIAGNOSIS — R509 Fever, unspecified: Secondary | ICD-10-CM | POA: Insufficient documentation

## 2012-08-13 DIAGNOSIS — Z8669 Personal history of other diseases of the nervous system and sense organs: Secondary | ICD-10-CM | POA: Insufficient documentation

## 2012-08-13 DIAGNOSIS — Z8739 Personal history of other diseases of the musculoskeletal system and connective tissue: Secondary | ICD-10-CM | POA: Insufficient documentation

## 2012-08-13 DIAGNOSIS — G40909 Epilepsy, unspecified, not intractable, without status epilepticus: Secondary | ICD-10-CM | POA: Insufficient documentation

## 2012-08-13 DIAGNOSIS — N189 Chronic kidney disease, unspecified: Secondary | ICD-10-CM | POA: Insufficient documentation

## 2012-08-13 DIAGNOSIS — Z8679 Personal history of other diseases of the circulatory system: Secondary | ICD-10-CM | POA: Insufficient documentation

## 2012-08-13 DIAGNOSIS — F329 Major depressive disorder, single episode, unspecified: Secondary | ICD-10-CM | POA: Insufficient documentation

## 2012-08-13 DIAGNOSIS — E119 Type 2 diabetes mellitus without complications: Secondary | ICD-10-CM | POA: Insufficient documentation

## 2012-08-13 DIAGNOSIS — N390 Urinary tract infection, site not specified: Secondary | ICD-10-CM

## 2012-08-13 DIAGNOSIS — Z79899 Other long term (current) drug therapy: Secondary | ICD-10-CM | POA: Insufficient documentation

## 2012-08-13 DIAGNOSIS — F3289 Other specified depressive episodes: Secondary | ICD-10-CM | POA: Insufficient documentation

## 2012-08-13 DIAGNOSIS — I2510 Atherosclerotic heart disease of native coronary artery without angina pectoris: Secondary | ICD-10-CM | POA: Insufficient documentation

## 2012-08-13 DIAGNOSIS — R197 Diarrhea, unspecified: Secondary | ICD-10-CM | POA: Insufficient documentation

## 2012-08-13 DIAGNOSIS — Z794 Long term (current) use of insulin: Secondary | ICD-10-CM | POA: Insufficient documentation

## 2012-08-13 DIAGNOSIS — Z8719 Personal history of other diseases of the digestive system: Secondary | ICD-10-CM | POA: Insufficient documentation

## 2012-08-13 DIAGNOSIS — K219 Gastro-esophageal reflux disease without esophagitis: Secondary | ICD-10-CM | POA: Insufficient documentation

## 2012-08-13 DIAGNOSIS — F17200 Nicotine dependence, unspecified, uncomplicated: Secondary | ICD-10-CM | POA: Insufficient documentation

## 2012-08-13 DIAGNOSIS — I129 Hypertensive chronic kidney disease with stage 1 through stage 4 chronic kidney disease, or unspecified chronic kidney disease: Secondary | ICD-10-CM | POA: Insufficient documentation

## 2012-08-13 DIAGNOSIS — G8929 Other chronic pain: Secondary | ICD-10-CM | POA: Insufficient documentation

## 2012-08-13 DIAGNOSIS — E669 Obesity, unspecified: Secondary | ICD-10-CM | POA: Insufficient documentation

## 2012-08-13 DIAGNOSIS — I509 Heart failure, unspecified: Secondary | ICD-10-CM | POA: Insufficient documentation

## 2012-08-13 LAB — CBC WITH DIFFERENTIAL/PLATELET
Basophils Relative: 0 % (ref 0–1)
Eosinophils Absolute: 0.1 10*3/uL (ref 0.0–0.7)
Eosinophils Relative: 1 % (ref 0–5)
Hemoglobin: 13.2 g/dL (ref 13.0–17.0)
MCH: 28.8 pg (ref 26.0–34.0)
MCHC: 34.4 g/dL (ref 30.0–36.0)
Monocytes Relative: 7 % (ref 3–12)
Neutrophils Relative %: 86 % — ABNORMAL HIGH (ref 43–77)
Platelets: 193 10*3/uL (ref 150–400)

## 2012-08-13 LAB — COMPREHENSIVE METABOLIC PANEL
Albumin: 3.4 g/dL — ABNORMAL LOW (ref 3.5–5.2)
Alkaline Phosphatase: 102 U/L (ref 39–117)
BUN: 37 mg/dL — ABNORMAL HIGH (ref 6–23)
Calcium: 8.6 mg/dL (ref 8.4–10.5)
Potassium: 3.5 mEq/L (ref 3.5–5.1)
Total Protein: 7.1 g/dL (ref 6.0–8.3)

## 2012-08-13 LAB — URINE MICROSCOPIC-ADD ON

## 2012-08-13 LAB — LIPASE, BLOOD: Lipase: 13 U/L (ref 11–59)

## 2012-08-13 LAB — URINALYSIS, ROUTINE W REFLEX MICROSCOPIC
Glucose, UA: NEGATIVE mg/dL
Leukocytes, UA: NEGATIVE
Protein, ur: 100 mg/dL — AB
Specific Gravity, Urine: 1.02 (ref 1.005–1.030)
Urobilinogen, UA: 0.2 mg/dL (ref 0.0–1.0)

## 2012-08-13 LAB — LACTIC ACID, PLASMA: Lactic Acid, Venous: 1 mmol/L (ref 0.5–2.2)

## 2012-08-13 MED ORDER — SODIUM CHLORIDE 0.9 % IV BOLUS (SEPSIS)
1000.0000 mL | Freq: Once | INTRAVENOUS | Status: AC
  Administered 2012-08-13: 1000 mL via INTRAVENOUS

## 2012-08-13 MED ORDER — ACETAMINOPHEN 500 MG PO TABS
1000.0000 mg | ORAL_TABLET | Freq: Once | ORAL | Status: AC
  Administered 2012-08-13: 1000 mg via ORAL
  Filled 2012-08-13: qty 2

## 2012-08-13 MED ORDER — CIPROFLOXACIN HCL 500 MG PO TABS: 500.0000 mg | ORAL_TABLET | Freq: Two times a day (BID) | ORAL | Status: DC

## 2012-08-13 MED ORDER — ONDANSETRON HCL 4 MG/2ML IJ SOLN
4.0000 mg | Freq: Once | INTRAMUSCULAR | Status: AC
  Administered 2012-08-13: 4 mg via INTRAVENOUS
  Filled 2012-08-13: qty 2

## 2012-08-13 MED ORDER — PROMETHAZINE HCL 25 MG PO TABS: 25.0000 mg | ORAL_TABLET | Freq: Four times a day (QID) | ORAL | Status: DC | PRN

## 2012-08-13 NOTE — ED Provider Notes (Addendum)
History     CSN: [id number]  Arrival date & time 08/13/12  1425   First MD Initiated Contact with Patient 08/13/12 1441      Chief Complaint  Patient presents with  . Nausea  . Emesis  . Diarrhea    (Consider location/radiation/quality/duration/timing/severity/associated sxs/prior treatment) Patient is a 57 y.o. male presenting with vomiting and diarrhea. The history is provided by the patient (the pt complains of fever and vomiting).  Emesis Severity:  Mild Timing:  Intermittent Quality:  Stomach contents Able to tolerate:  Liquids Progression:  Unchanged Associated symptoms: diarrhea   Associated symptoms: no abdominal pain and no headaches   Diarrhea Associated symptoms: vomiting   Associated symptoms: no abdominal pain and no headaches     Past Medical History  Diagnosis Date  . Diabetes mellitus   . Hypertension   . CHF (congestive heart failure)   . CRD (chronic renal disease)   . Arthritis   . Obesity   . Depression   . Sleep apnea   . Diabetic neuropathy   . CAD (coronary artery disease)   . Mitral regurgitation   . Esophagitis, reflux 07/07/10    MW tear, erosive reflux esophagitis, 2cm hh  . S/P colonoscopy April 2012    Rourk: torturous colon, hyperplastic polyps  . Collagen vascular disease     "myoclonic "  . Tremor   . Diverticulum   . Hiatal hernia   . GERD (gastroesophageal reflux disease)     erosive  . Gastroparesis 11/2010    No emptying at two hours.   . Chronic back pain   . Clonic seizure     controlled with klonodine    Past Surgical History  Procedure Laterality Date  . Neck surgery      discectomy  . Hand surgery      right-carpal tunnel  . Colonoscopy  before 2002    [place]  . Appendectomy    . Other surgical history      bone graft-knee  . Anterior lumbar corpectomy w/ fusion      C5-6/C4-7  . Colonoscopy  08/21/2010    elongaed tortuous colon otherwise normal  . Esophagogastroduodenoscopy  07/08/10    Dr.  Marny Lowenstein esophageal erosions and excoriations consistent with erosive reflux esophagitis, hiatal hernia  . Knee arthroscopy      right  . Esophagogastroduodenoscopy (egd) with propofol  02/25/2012    Procedure: ESOPHAGOGASTRODUODENOSCOPY (EGD) WITH PROPOFOL;  Surgeon: Corbin Ade, MD;  Location: [facility];  Service: Endoscopy;  Laterality: N/A;  7:30 /POLYPHARMACY    Family History  Problem Relation Age of Onset  . Stroke Father 98  . Stroke Mother 97  . Colon cancer Neg Hx   . Crohn's disease Neg Hx   . Cirrhosis Neg Hx   . Ulcerative colitis Neg Hx   . Stomach cancer Neg Hx     History  Substance Use Topics  . Smoking status: Current Every Day Smoker -- 0.50 packs/day for 40 years    Types: Cigarettes  . Smokeless tobacco: Never Used     Comment: ordered nicotine patches. start date is 27 July 2010  . Alcohol Use: No      Review of Systems  Constitutional: Negative for appetite change and fatigue.  HENT: Negative for congestion, sinus pressure and ear discharge.   Eyes: Negative for discharge.  Respiratory: Negative for cough.   Cardiovascular: Negative for chest pain.  Gastrointestinal: Positive for vomiting and diarrhea. Negative for abdominal pain.  Genitourinary: Negative for frequency and hematuria.  Musculoskeletal: Negative for back pain.  Skin: Negative for rash.  Neurological: Negative for seizures and headaches.  Psychiatric/Behavioral: Negative for hallucinations.    Allergies  Review of patient's allergies indicates no known allergies.  Home Medications   Current Outpatient Rx  Name  Route  Sig  Dispense  Refill  . insulin aspart protamine-insulin aspart (NOVOLOG 70/30) (70-30) 100 UNIT/ML injection   Subcutaneous   Inject 10-20 Units into the skin 2 (two) times daily with a meal. Units vary based on food consumed.         Marland Kitchen morphine (MSIR) 30 MG tablet   Oral   Take 30 mg by mouth every 4 (four) hours as needed for pain.         .  baclofen (LIORESAL) 5 mg TABS   Oral   Take 0.5 tablets (5 mg total) by mouth 3 (three) times daily.   90 tablet   3   . ciprofloxacin (CIPRO) 500 MG tablet   Oral   Take 1 tablet (500 mg total) by mouth 2 (two) times daily.   20 tablet   0   . clonazePAM (KLONOPIN) 0.5 MG tablet   Oral   Take 0.5-1 mg by mouth 2 (two) times daily. 0.5 mg in the a.m. And 1 mg at night         . dicyclomine (BENTYL) 10 MG capsule   Oral   Take 1 capsule (10 mg total) by mouth 3 (three) times daily as needed (hold for constipation, use sparingly as can slow down stomach emptying).   90 capsule   0   . FLUoxetine (PROZAC) 40 MG capsule   Oral   Take 40 mg by mouth 2 (two) times daily.         . furosemide (LASIX) 20 MG tablet   Oral   Take 40 mg by mouth daily as needed. For fluid         . gabapentin (NEURONTIN) 400 MG capsule   Oral   Take 400 mg by mouth 3 (three) times daily.         . hydrOXYzine (ATARAX/VISTARIL) 10 MG tablet   Oral   Take 10 mg by mouth 3 (three) times daily as needed. For itching         . EXPIRED: modafinil (PROVIGIL) 100 MG tablet   Oral   Take 1 tablet (100 mg total) by mouth daily. Please take daily for another 3 days, then increase to twice a day   60 tablet   3   . modafinil (PROVIGIL) 200 MG tablet   Oral   Take 100 mg by mouth 2 (two) times daily.         . Multiple Vitamins-Minerals (CENTRUM SILVER ULTRA MENS) TABS   Oral   Take 1 tablet by mouth daily.           . ondansetron (ZOFRAN-ODT) 4 MG disintegrating tablet   Oral   Take 1 tablet (4 mg total) by mouth every 4 (four) hours as needed for nausea.   20 tablet   0   . pantoprazole (PROTONIX) 40 MG tablet   Oral   Take 40 mg by mouth daily.         . polyethylene glycol (MIRALAX / GLYCOLAX) packet   Oral   Take 17 g by mouth daily as needed. For constipation         . promethazine (PHENERGAN) 25 MG suppository  Rectal   Place 1 suppository (25 mg total) rectally  every 6 (six) hours as needed for nausea.   12 each   0   . promethazine (PHENERGAN) 25 MG tablet   Oral   Take 25 mg by mouth every 6 (six) hours as needed. For nausea         . promethazine (PHENERGAN) 25 MG tablet   Oral   Take 1 tablet (25 mg total) by mouth every 6 (six) hours as needed for nausea.   15 tablet   0   . rOPINIRole (REQUIP) 2 MG tablet   Oral   Take 4 mg by mouth at bedtime.          . traMADol (ULTRAM) 50 MG tablet   Oral   Take 100 mg by mouth every 6 (six) hours as needed.           BP 132/60  Pulse 89  Temp(Src) 100.1 F (37.8 C) (Oral)  Resp 15  Ht 6' (1.829 m)  Wt 210 lb (95.255 kg)  BMI 28.47 kg/m2  SpO2 92%  Physical Exam  Constitutional: He is oriented to person, place, and time. He appears well-developed.  HENT:  Head: Normocephalic.  Eyes: Conjunctivae and EOM are normal. No scleral icterus.  Neck: Neck supple. No thyromegaly present.  Cardiovascular: Normal rate and regular rhythm.  Exam reveals no gallop and no friction rub.   No murmur heard. Pulmonary/Chest: No stridor. He has no wheezes. He has no rales. He exhibits no tenderness.  Abdominal: He exhibits no distension. There is no tenderness. There is no rebound.  Musculoskeletal: Normal range of motion. He exhibits no edema.  Lymphadenopathy:    He has no cervical adenopathy.  Neurological: He is oriented to person, place, and time. Coordination normal.  Skin: No rash noted. No erythema.  Psychiatric: He has a normal mood and affect. His behavior is normal.    ED Course  Procedures (including critical care time)  Labs Reviewed  CBC WITH DIFFERENTIAL - Abnormal; Notable for the following:    HCT 38.4 (*)    Neutrophils Relative 86 (*)    Neutro Abs 8.4 (*)    Lymphocytes Relative 6 (*)    Lymphs Abs 0.6 (*)    All other components within normal limits  COMPREHENSIVE METABOLIC PANEL - Abnormal; Notable for the following:    Sodium 132 (*)    Glucose, Bld 223 (*)     BUN 37 (*)    Creatinine, Ser 2.39 (*)    Albumin 3.4 (*)    Total Bilirubin 0.2 (*)    GFR calc non Af Amer 28 (*)    GFR calc Af Amer 33 (*)    All other components within normal limits  URINALYSIS, ROUTINE W REFLEX MICROSCOPIC - Abnormal; Notable for the following:    Hgb urine dipstick SMALL (*)    Protein, ur 100 (*)    All other components within normal limits  RAPID STREP SCREEN  URINE CULTURE  LIPASE, BLOOD  LACTIC ACID, PLASMA  URINE MICROSCOPIC-ADD ON   Dg Abd Acute W/chest  08/13/2012  *RADIOLOGY REPORT*  Clinical Data: Abdominal pain, weakness.  ACUTE ABDOMEN SERIES (ABDOMEN 2 VIEW & CHEST 1 VIEW)  Comparison: 07/07/2010  Findings: Moderate stool burden throughout the colon. The bowel gas pattern is normal.  There is no evidence of free intraperitoneal air.  No suspicious radio-opaque calculi or other significant radiographic abnormality is seen. Heart size and mediastinal contours are within  normal limits.  Both lungs are clear.  IMPRESSION: No acute findings.   Original Report Authenticated By: Charlett Nose, M.D.      1. UTI (lower urinary tract infection)       MDM       Date: 08/13/2012  Rate: 96  Rhythm: normal sinus rhythm  QRS Axis: left  Intervals: normal  ST/T Wave abnormalities: nonspecific ST changes  Conduction Disutrbances:none  Narrative Interpretation:   Old EKG Reviewed: unchanged      Benny Lennert, MD 08/13/12 1647  Benny Lennert, MD 08/13/12 [phone]

## 2012-08-13 NOTE — ED Notes (Addendum)
Pt reports has had n/v/d x 2 or 3 days.  Reports has been having chills.  Reports fever as high as 104 at home today.  Also confusion.

## 2012-08-13 NOTE — ED Notes (Signed)
Pt and family are waiting for their daughter to come pick them up.

## 2012-08-14 LAB — URINE CULTURE
Colony Count: NO GROWTH
Culture: NO GROWTH

## 2012-12-08 ENCOUNTER — Encounter (HOSPITAL_COMMUNITY): Payer: Self-pay

## 2012-12-08 ENCOUNTER — Inpatient Hospital Stay (HOSPITAL_COMMUNITY)
Admission: EM | Admit: 2012-12-08 | Discharge: 2012-12-10 | DRG: 092 | Disposition: A | Discharge status: Home / Self Care | Payer: Medicare Other | Attending: Internal Medicine | Admitting: Internal Medicine

## 2012-12-08 ENCOUNTER — Emergency Department (HOSPITAL_COMMUNITY): Payer: Medicare Other

## 2012-12-08 DIAGNOSIS — F17200 Nicotine dependence, unspecified, uncomplicated: Secondary | ICD-10-CM | POA: Diagnosis present

## 2012-12-08 DIAGNOSIS — Z9989 Dependence on other enabling machines and devices: Secondary | ICD-10-CM | POA: Diagnosis present

## 2012-12-08 DIAGNOSIS — M720 Palmar fascial fibromatosis [Dupuytren]: Secondary | ICD-10-CM

## 2012-12-08 DIAGNOSIS — I059 Rheumatic mitral valve disease, unspecified: Secondary | ICD-10-CM | POA: Diagnosis present

## 2012-12-08 DIAGNOSIS — I129 Hypertensive chronic kidney disease with stage 1 through stage 4 chronic kidney disease, or unspecified chronic kidney disease: Secondary | ICD-10-CM | POA: Diagnosis present

## 2012-12-08 DIAGNOSIS — I252 Old myocardial infarction: Secondary | ICD-10-CM

## 2012-12-08 DIAGNOSIS — E1149 Type 2 diabetes mellitus with other diabetic neurological complication: Secondary | ICD-10-CM | POA: Diagnosis present

## 2012-12-08 DIAGNOSIS — D72829 Elevated white blood cell count, unspecified: Secondary | ICD-10-CM

## 2012-12-08 DIAGNOSIS — E1142 Type 2 diabetes mellitus with diabetic polyneuropathy: Secondary | ICD-10-CM | POA: Diagnosis present

## 2012-12-08 DIAGNOSIS — G929 Unspecified toxic encephalopathy: Principal | ICD-10-CM | POA: Diagnosis present

## 2012-12-08 DIAGNOSIS — N179 Acute kidney failure, unspecified: Secondary | ICD-10-CM

## 2012-12-08 DIAGNOSIS — I509 Heart failure, unspecified: Secondary | ICD-10-CM

## 2012-12-08 DIAGNOSIS — I428 Other cardiomyopathies: Secondary | ICD-10-CM

## 2012-12-08 DIAGNOSIS — G9340 Encephalopathy, unspecified: Secondary | ICD-10-CM | POA: Diagnosis present

## 2012-12-08 DIAGNOSIS — G2581 Restless legs syndrome: Secondary | ICD-10-CM

## 2012-12-08 DIAGNOSIS — IMO0002 Reserved for concepts with insufficient information to code with codable children: Secondary | ICD-10-CM

## 2012-12-08 DIAGNOSIS — M129 Arthropathy, unspecified: Secondary | ICD-10-CM

## 2012-12-08 DIAGNOSIS — K5909 Other constipation: Secondary | ICD-10-CM

## 2012-12-08 DIAGNOSIS — F411 Generalized anxiety disorder: Secondary | ICD-10-CM

## 2012-12-08 DIAGNOSIS — I2510 Atherosclerotic heart disease of native coronary artery without angina pectoris: Secondary | ICD-10-CM

## 2012-12-08 DIAGNOSIS — I5022 Chronic systolic (congestive) heart failure: Secondary | ICD-10-CM | POA: Diagnosis present

## 2012-12-08 DIAGNOSIS — G92 Toxic encephalopathy: Principal | ICD-10-CM | POA: Diagnosis present

## 2012-12-08 DIAGNOSIS — IMO0001 Reserved for inherently not codable concepts without codable children: Secondary | ICD-10-CM

## 2012-12-08 DIAGNOSIS — N183 Chronic kidney disease, stage 3 unspecified: Secondary | ICD-10-CM | POA: Diagnosis present

## 2012-12-08 DIAGNOSIS — G609 Hereditary and idiopathic neuropathy, unspecified: Secondary | ICD-10-CM

## 2012-12-08 DIAGNOSIS — R4182 Altered mental status, unspecified: Secondary | ICD-10-CM

## 2012-12-08 DIAGNOSIS — K2100 Gastro-esophageal reflux disease with esophagitis, without bleeding: Secondary | ICD-10-CM | POA: Diagnosis present

## 2012-12-08 DIAGNOSIS — R413 Other amnesia: Secondary | ICD-10-CM

## 2012-12-08 DIAGNOSIS — R112 Nausea with vomiting, unspecified: Secondary | ICD-10-CM

## 2012-12-08 DIAGNOSIS — G253 Myoclonus: Secondary | ICD-10-CM | POA: Diagnosis present

## 2012-12-08 DIAGNOSIS — E1165 Type 2 diabetes mellitus with hyperglycemia: Secondary | ICD-10-CM

## 2012-12-08 DIAGNOSIS — R209 Unspecified disturbances of skin sensation: Secondary | ICD-10-CM

## 2012-12-08 DIAGNOSIS — E785 Hyperlipidemia, unspecified: Secondary | ICD-10-CM

## 2012-12-08 DIAGNOSIS — R410 Disorientation, unspecified: Secondary | ICD-10-CM

## 2012-12-08 DIAGNOSIS — R892 Abnormal level of other drugs, medicaments and biological substances in specimens from other organs, systems and tissues: Secondary | ICD-10-CM

## 2012-12-08 DIAGNOSIS — R634 Abnormal weight loss: Secondary | ICD-10-CM

## 2012-12-08 DIAGNOSIS — N289 Disorder of kidney and ureter, unspecified: Secondary | ICD-10-CM

## 2012-12-08 DIAGNOSIS — K3184 Gastroparesis: Secondary | ICD-10-CM

## 2012-12-08 DIAGNOSIS — G4733 Obstructive sleep apnea (adult) (pediatric): Secondary | ICD-10-CM | POA: Diagnosis present

## 2012-12-08 DIAGNOSIS — F3289 Other specified depressive episodes: Secondary | ICD-10-CM

## 2012-12-08 DIAGNOSIS — L739 Follicular disorder, unspecified: Secondary | ICD-10-CM

## 2012-12-08 DIAGNOSIS — N390 Urinary tract infection, site not specified: Secondary | ICD-10-CM

## 2012-12-08 DIAGNOSIS — G894 Chronic pain syndrome: Secondary | ICD-10-CM | POA: Diagnosis present

## 2012-12-08 DIAGNOSIS — M545 Low back pain, unspecified: Secondary | ICD-10-CM

## 2012-12-08 DIAGNOSIS — D649 Anemia, unspecified: Secondary | ICD-10-CM

## 2012-12-08 DIAGNOSIS — Z794 Long term (current) use of insulin: Secondary | ICD-10-CM

## 2012-12-08 DIAGNOSIS — M542 Cervicalgia: Secondary | ICD-10-CM | POA: Diagnosis present

## 2012-12-08 DIAGNOSIS — R251 Tremor, unspecified: Secondary | ICD-10-CM

## 2012-12-08 DIAGNOSIS — F329 Major depressive disorder, single episode, unspecified: Secondary | ICD-10-CM

## 2012-12-08 DIAGNOSIS — G4700 Insomnia, unspecified: Secondary | ICD-10-CM

## 2012-12-08 DIAGNOSIS — F528 Other sexual dysfunction not due to a substance or known physiological condition: Secondary | ICD-10-CM

## 2012-12-08 DIAGNOSIS — F1210 Cannabis abuse, uncomplicated: Secondary | ICD-10-CM | POA: Diagnosis present

## 2012-12-08 DIAGNOSIS — E669 Obesity, unspecified: Secondary | ICD-10-CM | POA: Diagnosis present

## 2012-12-08 DIAGNOSIS — I10 Essential (primary) hypertension: Secondary | ICD-10-CM | POA: Diagnosis present

## 2012-12-08 DIAGNOSIS — N189 Chronic kidney disease, unspecified: Secondary | ICD-10-CM

## 2012-12-08 DIAGNOSIS — K219 Gastro-esophageal reflux disease without esophagitis: Secondary | ICD-10-CM

## 2012-12-08 DIAGNOSIS — Z79899 Other long term (current) drug therapy: Secondary | ICD-10-CM

## 2012-12-08 DIAGNOSIS — G40409 Other generalized epilepsy and epileptic syndromes, not intractable, without status epilepticus: Secondary | ICD-10-CM

## 2012-12-08 DIAGNOSIS — M79609 Pain in unspecified limb: Secondary | ICD-10-CM

## 2012-12-08 LAB — CBC WITH DIFFERENTIAL/PLATELET
Basophils Absolute: 0.1 10*3/uL (ref 0.0–0.1)
Lymphocytes Relative: 21 % (ref 12–46)
Lymphs Abs: 2.2 10*3/uL (ref 0.7–4.0)
Neutro Abs: 6.5 10*3/uL (ref 1.7–7.7)
Platelets: 191 10*3/uL (ref 150–400)
RBC: 4.54 MIL/uL (ref 4.22–5.81)
RDW: 15.2 % (ref 11.5–15.5)
WBC: 10.3 10*3/uL (ref 4.0–10.5)

## 2012-12-08 LAB — COMPREHENSIVE METABOLIC PANEL
ALT: 19 U/L (ref 0–53)
AST: 19 U/L (ref 0–37)
Alkaline Phosphatase: 105 U/L (ref 39–117)
CO2: 31 mEq/L (ref 19–32)
Chloride: 99 mEq/L (ref 96–112)
GFR calc non Af Amer: 24 mL/min — ABNORMAL LOW (ref >90)
Glucose, Bld: 130 mg/dL — ABNORMAL HIGH (ref 70–99)
Potassium: 4.3 mEq/L (ref 3.5–5.1)
Sodium: 139 mEq/L (ref 135–145)
Total Bilirubin: 0.2 mg/dL — ABNORMAL LOW (ref 0.3–1.2)

## 2012-12-08 LAB — GLUCOSE, CAPILLARY: Glucose-Capillary: 127 mg/dL — ABNORMAL HIGH (ref 70–99)

## 2012-12-08 NOTE — ED Notes (Signed)
Wife noticed pt was increasingly confused starting at 8 pm tonight, is oriented to person and place only, states he has pain "all over"  Per pts wife, pt had weakness to the left side earlier, slurred speech.

## 2012-12-08 NOTE — ED Provider Notes (Signed)
CSN: [id number]     Arrival date & time 12/08/12  2253 History    This chart was scribed for Dione Booze, MD,  by Ashley Jacobs, ED Scribe. The patient was seen in room APA16A/[facility]16A and the patient's care was started at 11:24 PM.   First MD Initiated Contact with Patient 12/08/12 2309     Chief Complaint  Patient presents with  . Altered Mental Status  . Weakness   (Consider location/radiation/quality/duration/timing/severity/associated sxs/prior Treatment) The history is provided by the spouse and medical records. No language interpreter was used.   HPI Comments: LEVEL 5 CAVEAT: ALTERED MENTAL STATUS [name] is a 57 y.o. male whose spouse presents to the Emergency Department complaining of altered mental status and weakness that presented 5 hours PTA after going shopping and driving home. Pt's wife reports that earlier her husband requested to stay in the car while she continued to shop. On the way home the pt began to drive closer than usual to the steering wheel and . However, after 10 minutes of being home the pt seemed be very confused and talked incoherently.   After shopping the pt was not himself and was talking normally around 6 pm. Wife mentoned that pt was driving really close to the steering wheel and seemed slightly confused. However, within 10 minutes of arriving home the pt began to talk incoherently, inappropriately answered simple questions (identified his wife as his daughter). She mentions that he did not a response to pain when pricking him with a tooth pick. Pt's wife reports giving him electrolytes with no improvements. Pt has a hx of chronic clonic seizure and wife reports that he had a similar episode recently. She mentions that he has other episodes where he phases out of consciousness  in slow motion, syncope and sleeps for 3-4 days.   Pt goes to [place] and [facility]   Past Medical History  Diagnosis Date  . Diabetes mellitus   . Hypertension   . CHF  (congestive heart failure)   . CRD (chronic renal disease)   . Arthritis   . Obesity   . Depression   . Sleep apnea   . Diabetic neuropathy   . CAD (coronary artery disease)   . Mitral regurgitation   . Esophagitis, reflux 07/07/10    MW tear, erosive reflux esophagitis, 2cm hh  . S/P colonoscopy April 2012    Rourk: torturous colon, hyperplastic polyps  . Collagen vascular disease     "myoclonic "  . Tremor   . Diverticulum   . Hiatal hernia   . GERD (gastroesophageal reflux disease)     erosive  . Gastroparesis 11/2010    No emptying at two hours.   . Chronic back pain   . Clonic seizure     controlled with klonodine   Past Surgical History  Procedure Laterality Date  . Neck surgery      discectomy  . Hand surgery      right-carpal tunnel  . Colonoscopy  before 2002    [place]  . Appendectomy    . Other surgical history      bone graft-knee  . Anterior lumbar corpectomy w/ fusion      C5-6/C4-7  . Colonoscopy  08/21/2010    elongaed tortuous colon otherwise normal  . Esophagogastroduodenoscopy  07/08/10    Dr. Marny Lowenstein esophageal erosions and excoriations consistent with erosive reflux esophagitis, hiatal hernia  . Knee arthroscopy      right  . Esophagogastroduodenoscopy (egd)  with propofol  02/25/2012    Procedure: ESOPHAGOGASTRODUODENOSCOPY (EGD) WITH PROPOFOL;  Surgeon: Corbin Ade, MD;  Location: [facility];  Service: Endoscopy;  Laterality: N/A;  7:30 /POLYPHARMACY   Family History  Problem Relation Age of Onset  . Stroke Father 28  . Stroke Mother 49  . Colon cancer Neg Hx   . Crohn's disease Neg Hx   . Cirrhosis Neg Hx   . Ulcerative colitis Neg Hx   . Stomach cancer Neg Hx    History  Substance Use Topics  . Smoking status: Current Every Day Smoker -- 0.50 packs/day for 40 years    Types: Cigarettes  . Smokeless tobacco: Never Used     Comment: ordered nicotine patches. start date is 27 July 2010  . Alcohol Use: No    Review of Systems   Unable to perform ROS: Mental status change    Allergies  Ms contin  Home Medications   Current Outpatient Rx  Name  Route  Sig  Dispense  Refill  . baclofen (LIORESAL) 5 mg TABS   Oral   Take 0.5 tablets (5 mg total) by mouth 3 (three) times daily.   90 tablet   3   . ciprofloxacin (CIPRO) 500 MG tablet   Oral   Take 1 tablet (500 mg total) by mouth 2 (two) times daily.   20 tablet   0   . clonazePAM (KLONOPIN) 0.5 MG tablet   Oral   Take 0.5-1 mg by mouth 2 (two) times daily. 0.5 mg in the a.m. And 1 mg at night         . dicyclomine (BENTYL) 10 MG capsule   Oral   Take 1 capsule (10 mg total) by mouth 3 (three) times daily as needed (hold for constipation, use sparingly as can slow down stomach emptying).   90 capsule   0   . FLUoxetine (PROZAC) 40 MG capsule   Oral   Take 40 mg by mouth 2 (two) times daily.         . furosemide (LASIX) 20 MG tablet   Oral   Take 40 mg by mouth daily as needed. For fluid         . gabapentin (NEURONTIN) 400 MG capsule   Oral   Take 400 mg by mouth 3 (three) times daily.         . hydrOXYzine (ATARAX/VISTARIL) 10 MG tablet   Oral   Take 10 mg by mouth 3 (three) times daily as needed. For itching         . insulin aspart protamine-insulin aspart (NOVOLOG 70/30) (70-30) 100 UNIT/ML injection   Subcutaneous   Inject 10-20 Units into the skin 2 (two) times daily with a meal. Units vary based on food consumed.         Marland Kitchen EXPIRED: modafinil (PROVIGIL) 100 MG tablet   Oral   Take 1 tablet (100 mg total) by mouth daily. Please take daily for another 3 days, then increase to twice a day   60 tablet   3   . modafinil (PROVIGIL) 200 MG tablet   Oral   Take 100 mg by mouth 2 (two) times daily.         Marland Kitchen morphine (MSIR) 30 MG tablet   Oral   Take 30 mg by mouth every 4 (four) hours as needed for pain.         . Multiple Vitamins-Minerals (CENTRUM SILVER ULTRA MENS) TABS   Oral  Take 1 tablet by mouth daily.            . ondansetron (ZOFRAN-ODT) 4 MG disintegrating tablet   Oral   Take 1 tablet (4 mg total) by mouth every 4 (four) hours as needed for nausea.   20 tablet   0   . pantoprazole (PROTONIX) 40 MG tablet   Oral   Take 40 mg by mouth daily.         . polyethylene glycol (MIRALAX / GLYCOLAX) packet   Oral   Take 17 g by mouth daily as needed. For constipation         . promethazine (PHENERGAN) 25 MG suppository   Rectal   Place 1 suppository (25 mg total) rectally every 6 (six) hours as needed for nausea.   12 each   0   . promethazine (PHENERGAN) 25 MG tablet   Oral   Take 25 mg by mouth every 6 (six) hours as needed. For nausea         . promethazine (PHENERGAN) 25 MG tablet   Oral   Take 1 tablet (25 mg total) by mouth every 6 (six) hours as needed for nausea.   15 tablet   0   . rOPINIRole (REQUIP) 2 MG tablet   Oral   Take 4 mg by mouth at bedtime.          . traMADol (ULTRAM) 50 MG tablet   Oral   Take 100 mg by mouth every 6 (six) hours as needed.          BP 145/83  Pulse 73  Temp(Src) 98.1 F (36.7 C) (Oral)  SpO2 98% Physical Exam  Nursing note and vitals reviewed. Constitutional: He appears well-developed and well-nourished.  Level 5 Caveat  HENT:  Head: Normocephalic and atraumatic.  Mucous membranes are dry   Eyes: Pupils are equal, round, and reactive to light.  Pupils 3 mm reactive  Fundi - no hemophage or exudate Unable to cooperate for EOM  Neck: Neck supple. No JVD present.  Cardiovascular: Normal rate, regular rhythm and normal heart sounds.   Pulmonary/Chest: Breath sounds normal. No respiratory distress. He has no wheezes. He has no rales.  Abdominal: Soft. Bowel sounds are normal. There is no tenderness.  Musculoskeletal: He exhibits edema. He exhibits no tenderness.  1+ edema   Lymphadenopathy:    He has no cervical adenopathy.  Neurological:   And lame still with his mouth ope repond to pain and voice will not  follow comman Oriented to person Mild weakenss to L compared wuith vr  Skin: Skin is warm and dry. No rash noted.    ED Course  DIAGNOSTIC STUDIES: Oxygen Saturation is 98% on room air, normal by my interpretation.    COORDINATION OF CARE:     Procedures (including critical care time)  Results for orders placed during the hospital encounter of 12/08/12  GLUCOSE, CAPILLARY      Result Value Range   Glucose-Capillary 127 (*) 70 - 99 mg/dL  CBC WITH DIFFERENTIAL      Result Value Range   WBC 10.3  4.0 - 10.5 K/uL   RBC 4.54  4.22 - 5.81 MIL/uL   Hemoglobin 12.9 (*) 13.0 - 17.0 g/dL   HCT 82.9  56.2 - 13.0 %   MCV 87.4  78.0 - 100.0 fL   MCH 28.4  26.0 - 34.0 pg   MCHC 32.5  30.0 - 36.0 g/dL   RDW 86.5  78.4 - 69.6 %  Platelets 191  150 - 400 K/uL   Neutrophils Relative % 63  43 - 77 %   Neutro Abs 6.5  1.7 - 7.7 K/uL   Lymphocytes Relative 21  12 - 46 %   Lymphs Abs 2.2  0.7 - 4.0 K/uL   Monocytes Relative 11  3 - 12 %   Monocytes Absolute 1.1 (*) 0.1 - 1.0 K/uL   Eosinophils Relative 4  0 - 5 %   Eosinophils Absolute 0.4  0.0 - 0.7 K/uL   Basophils Relative 1  0 - 1 %   Basophils Absolute 0.1  0.0 - 0.1 K/uL  COMPREHENSIVE METABOLIC PANEL      Result Value Range   Sodium 139  135 - 145 mEq/L   Potassium 4.3  3.5 - 5.1 mEq/L   Chloride 99  96 - 112 mEq/L   CO2 31  19 - 32 mEq/L   Glucose, Bld 130 (*) 70 - 99 mg/dL   BUN 36 (*) 6 - 23 mg/dL   Creatinine, Ser 4.78 (*) 0.50 - 1.35 mg/dL   Calcium 9.3  8.4 - 29.5 mg/dL   Total Protein 7.2  6.0 - 8.3 g/dL   Albumin 3.7  3.5 - 5.2 g/dL   AST 19  0 - 37 U/L   ALT 19  0 - 53 U/L   Alkaline Phosphatase 105  39 - 117 U/L   Total Bilirubin 0.2 (*) 0.3 - 1.2 mg/dL   GFR calc non Af Amer 24 (*) >90 mL/min   GFR calc Af Amer 27 (*) >90 mL/min  TROPONIN I      Result Value Range   Troponin I <0.30  <0.30 ng/mL   Dg Chest 1 View  12/09/2012   *RADIOLOGY REPORT*  Clinical Data: Left-sided weakness.  CHEST - 1 VIEW   Comparison: 08/13/2012  Findings: Borderline cardiomegaly, accentuated by low lung volumes. No change from prior.  Interstitial crowding without infiltrate, edema, effusion, pneumothorax.  Unchanged density increase at the first costochondral junctions.  Lower cervical to the upper thoracic plate.  IMPRESSION: Low volume lungs without evidence of acute cardiopulmonary disease.   Original Report Authenticated By: Tiburcio Pea   Ct Head Wo Contrast  12/09/2012   *RADIOLOGY REPORT*  Clinical Data: Altered mental status, left-sided weakness  CT HEAD WITHOUT CONTRAST  Technique:  Contiguous axial images were obtained from the base of the skull through the vertex without contrast.  Comparison: 12/20/2011  Findings: No acute hemorrhage, acute infarction, or mass lesion is identified.  No midline shift.  No ventriculomegaly.  No skull fracture.  Orbits and paranasal sinuses are intact.  IMPRESSION: No acute intracranial finding.   Original Report Authenticated By: Christiana Pellant, M.D.    Images viewed by me.   Date: 12/09/2012  Rate: 69  Rhythm: normal sinus rhythm  QRS Axis: normal  Intervals: normal  ST/T Wave abnormalities: normal  Conduction Disutrbances:none  Narrative Interpretation: Poor R-wave progression with possible old anteroseptal myocardial infarction. When compared with ECG of 08/13/2012, no significant changes are seen.  Old EKG Reviewed: unchanged   1. Altered mental status   2. Renal insufficiency     MDM  Altered mental status of uncertain cause. This may be a stroke but there are no definite lateralizing findings on his exam. He will be sent for CT and screening labs have been obtained. His wife states that he is a full code and he actually had been intubated during his last hospital stay.  Workup  is unremarkable. There is slight worsening of chronic renal insufficiency head CT is normal and chest x-ray is unremarkable and remainder of laboratory workup is unremarkable. Case  is discussed with Dr. Rito Ehrlich [facility] who agrees to admit the patient.  I personally performed the services described in this documentation, which was scribed in my presence. The recorded information has been reviewed and is accurate.   Dione Booze, MD 12/09/12 [phone]

## 2012-12-09 ENCOUNTER — Inpatient Hospital Stay (HOSPITAL_COMMUNITY)
Admit: 2012-12-09 | Discharge: 2012-12-09 | Disposition: A | Discharge status: Home / Self Care | Payer: Medicare Other | Attending: Internal Medicine | Admitting: Internal Medicine

## 2012-12-09 ENCOUNTER — Encounter (HOSPITAL_COMMUNITY): Payer: Self-pay | Admitting: Internal Medicine

## 2012-12-09 ENCOUNTER — Inpatient Hospital Stay (HOSPITAL_COMMUNITY): Payer: Medicare Other

## 2012-12-09 DIAGNOSIS — E1142 Type 2 diabetes mellitus with diabetic polyneuropathy: Secondary | ICD-10-CM

## 2012-12-09 DIAGNOSIS — G253 Myoclonus: Secondary | ICD-10-CM

## 2012-12-09 DIAGNOSIS — G9340 Encephalopathy, unspecified: Secondary | ICD-10-CM | POA: Diagnosis present

## 2012-12-09 DIAGNOSIS — I10 Essential (primary) hypertension: Secondary | ICD-10-CM

## 2012-12-09 DIAGNOSIS — G4733 Obstructive sleep apnea (adult) (pediatric): Secondary | ICD-10-CM

## 2012-12-09 DIAGNOSIS — E1149 Type 2 diabetes mellitus with other diabetic neurological complication: Secondary | ICD-10-CM

## 2012-12-09 HISTORY — DX: Myoclonus: G25.3

## 2012-12-09 LAB — GLUCOSE, CAPILLARY
Glucose-Capillary: 113 mg/dL — ABNORMAL HIGH (ref 70–99)
Glucose-Capillary: 116 mg/dL — ABNORMAL HIGH (ref 70–99)

## 2012-12-09 LAB — RAPID URINE DRUG SCREEN, HOSP PERFORMED
Amphetamines: NOT DETECTED
Barbiturates: NOT DETECTED
Benzodiazepines: NOT DETECTED
Tetrahydrocannabinol: NOT DETECTED

## 2012-12-09 LAB — URINALYSIS, ROUTINE W REFLEX MICROSCOPIC
Bilirubin Urine: NEGATIVE
Nitrite: NEGATIVE
Specific Gravity, Urine: <1.005 — ABNORMAL LOW (ref 1.005–1.030)
Urobilinogen, UA: 0.2 mg/dL (ref 0.0–1.0)

## 2012-12-09 LAB — COMPREHENSIVE METABOLIC PANEL
BUN: 36 mg/dL — ABNORMAL HIGH (ref 6–23)
CO2: 28 mEq/L (ref 19–32)
Calcium: 9.1 mg/dL (ref 8.4–10.5)
Creatinine, Ser: 2.41 mg/dL — ABNORMAL HIGH (ref 0.50–1.35)
GFR calc Af Amer: 32 mL/min — ABNORMAL LOW (ref >90)
GFR calc non Af Amer: 28 mL/min — ABNORMAL LOW (ref >90)
Glucose, Bld: 156 mg/dL — ABNORMAL HIGH (ref 70–99)

## 2012-12-09 LAB — URINE MICROSCOPIC-ADD ON

## 2012-12-09 LAB — CBC
Hemoglobin: 13.1 g/dL (ref 13.0–17.0)
MCHC: 32.3 g/dL (ref 30.0–36.0)
RBC: 4.64 MIL/uL (ref 4.22–5.81)
WBC: 11.2 10*3/uL — ABNORMAL HIGH (ref 4.0–10.5)

## 2012-12-09 LAB — AMMONIA: Ammonia: 29 umol/L (ref 11–60)

## 2012-12-09 LAB — BLOOD GAS, ARTERIAL
Bicarbonate: 27.9 mEq/L — ABNORMAL HIGH (ref 20.0–24.0)
FIO2: 0.21 %
pH, Arterial: 7.358 (ref 7.350–7.450)
pO2, Arterial: 61 mmHg — ABNORMAL LOW (ref 80.0–100.0)

## 2012-12-09 LAB — HEMOGLOBIN A1C: Mean Plasma Glucose: 197 mg/dL — ABNORMAL HIGH (ref <117)

## 2012-12-09 MED ORDER — ONDANSETRON HCL 4 MG PO TABS: 4.0000 mg | ORAL_TABLET | Freq: Four times a day (QID) | ORAL | Status: DC | PRN

## 2012-12-09 MED ORDER — ACETAMINOPHEN 650 MG RE SUPP: 650.0000 mg | Freq: Four times a day (QID) | RECTAL | Status: DC | PRN

## 2012-12-09 MED ORDER — HEPARIN SODIUM (PORCINE) 5000 UNIT/ML IJ SOLN
5000.0000 [IU] | Freq: Three times a day (TID) | INTRAMUSCULAR | Status: DC
  Administered 2012-12-09 – 2012-12-10 (×4): 5000 [IU] via SUBCUTANEOUS
  Filled 2012-12-09 (×4): qty 1

## 2012-12-09 MED ORDER — ONDANSETRON HCL 4 MG/2ML IJ SOLN: 4.0000 mg | Freq: Four times a day (QID) | INTRAMUSCULAR | Status: DC | PRN

## 2012-12-09 MED ORDER — SODIUM CHLORIDE 0.9 % IJ SOLN
3.0000 mL | Freq: Two times a day (BID) | INTRAMUSCULAR | Status: DC
  Administered 2012-12-09: 3 mL via INTRAVENOUS

## 2012-12-09 MED ORDER — INSULIN ASPART 100 UNIT/ML ~~LOC~~ SOLN
0.0000 [IU] | SUBCUTANEOUS | Status: DC
  Administered 2012-12-09 (×2): 2 [IU] via SUBCUTANEOUS
  Administered 2012-12-09: 3 [IU] via SUBCUTANEOUS
  Administered 2012-12-10: 2 [IU] via SUBCUTANEOUS

## 2012-12-09 MED ORDER — NALOXONE HCL 0.4 MG/ML IJ SOLN
0.4000 mg | Freq: Once | INTRAMUSCULAR | Status: AC
  Administered 2012-12-09: 0.4 mg via INTRAVENOUS
  Filled 2012-12-09: qty 1

## 2012-12-09 MED ORDER — ACETAMINOPHEN 325 MG PO TABS
650.0000 mg | ORAL_TABLET | Freq: Four times a day (QID) | ORAL | Status: DC | PRN
  Filled 2012-12-09: qty 1

## 2012-12-09 MED ORDER — BIOTENE DRY MOUTH MT LIQD
15.0000 mL | Freq: Two times a day (BID) | OROMUCOSAL | Status: DC
  Administered 2012-12-09 – 2012-12-10 (×2): 15 mL via OROMUCOSAL

## 2012-12-09 MED ORDER — SODIUM CHLORIDE 0.9 % IV SOLN
INTRAVENOUS | Status: AC
  Administered 2012-12-09: 03:00:00 via INTRAVENOUS

## 2012-12-09 NOTE — Progress Notes (Signed)
INITIAL NUTRITION ASSESSMENT  DOCUMENTATION CODES Per approved criteria  -Obesity Unspecified   INTERVENTION: RD will follow for nutrition needs  NUTRITION DIAGNOSIS: Inadequate oral intake related to altered mental status, lethargy as evidenced by pt admission hx.  Goal: Pt to meet >/= 90% of their estimated nutrition needs   Monitor:  Po intake, labs and wt trends  Reason for Assessment: Malnutrition Screen Score = 5  57 y.o. male  Admitting Dx: Acute encephalopathy  ASSESSMENT: Patient Active Problem List   Diagnosis Date Noted  . Acute encephalopathy 12/09/2012  . Myoclonic jerking 12/09/2012  . Gastroparesis 12/21/2011  . Chronic constipation 12/21/2011  . history of Myoclonic seizure disorder 12/20/2011  . Altered mental status 12/20/2011  . Altered mental state 12/03/2011  . Weight loss, abnormal 03/12/2011  . Constipation 03/12/2011  . Anemia 02/16/2011  . UTI (lower urinary tract infection) 02/16/2011  . Drug toxicity 02/16/2011  . Delirium 02/15/2011  . Tremor 02/15/2011  . ARF (acute renal failure) 02/15/2011  . CKD (chronic kidney disease), stage III 02/15/2011  . Leukocytosis 02/15/2011  . OSA on CPAP 02/15/2011  . Chronic systolic heart failure 02/15/2011  . HTN (hypertension) 02/15/2011  . DM type 2 with diabetic peripheral neuropathy 02/15/2011  . Polypharmacy 02/15/2011  . Folliculitis of perineum 12/09/2010  . Nausea and vomiting 12/09/2010  . MEMORY LOSS 12/10/2008  . UNSPECIFIED MYALGIA AND MYOSITIS 10/31/2008  . DIABETES MELLITUS, TYPE II, UNCONTROLLED, WITH COMPLICATIONS 09/04/2008  . FOOT PAIN, BILATERAL 07/31/2008  . UNSPECIFIED ANEMIA 07/04/2008  . RENAL INSUFFICIENCY, ACUTE 07/04/2008  . SLEEP APNEA, OBSTRUCTIVE 06/04/2008  . ANKLE PAIN, BILATERAL 06/04/2008  . RESTLESS LEG SYNDROME 12/13/2007  . NECK PAIN, CHRONIC 12/01/2006  . INSOMNIA, CHRONIC 11/17/2006  . CARPAL TUNNEL SYNDROME, BILATERAL 07/30/2006  . DUPUYTREN'S  CONTRACTURE, BILATERAL 07/21/2006  . NUMBNESS, HAND 07/21/2006  . ERECTILE DYSFUNCTION 06/04/2006  . HYPERLIPIDEMIA 04/01/2006  . Morbid obesity 04/01/2006  . ANXIETY 04/01/2006  . DEPRESSION 04/01/2006  . PERIPHERAL NEUROPATHY 04/01/2006  . HYPERTENSION 04/01/2006  . MYOCARDIAL INFARCTION, HX OF 04/01/2006  . CORONARY ARTERY DISEASE 04/01/2006  . CARDIOMYOPATHY 04/01/2006  . CONGESTIVE HEART FAILURE 04/01/2006  . GERD 04/01/2006  . ARTHRITIS 04/01/2006  . LOW BACK PAIN 04/01/2006  Pt unable to provide hx at this time. Based on wt hx from past year there are no significant changes. Will continue to monitor meal intake and care plan progression and add oral supplement if indicated.   Height: Ht Readings from Last 1 Encounters:  12/09/12 5\' 7"  (1.702 m)    Weight: Wt Readings from Last 1 Encounters:  12/09/12 219 lb 9.3 oz (99.6 kg)    Ideal Body Weight: 148# (67.kg)  % Ideal Body Weight: 148%  Wt Readings from Last 10 Encounters:  12/09/12 219 lb 9.3 oz (99.6 kg)  08/13/12 210 lb (95.255 kg)  05/06/12 230 lb (104.327 kg)  03/31/12 220 lb (99.791 kg)  02/17/12 212 lb (96.163 kg)  01/27/12 211 lb 6.4 oz (95.89 kg)  12/24/11 209 lb 3.5 oz (94.9 kg)  12/04/11 207 lb (93.895 kg)  06/04/11 234 lb 3.2 oz (106.232 kg)  03/11/11 258 lb 3.2 oz (117.119 kg)    Usual Body Weight: 210-220#  % Usual Body Weight: 100%  BMI:  Body mass index is 34.38 kg/(m^2).Obesity Class I  Estimated Nutritional Needs: Kcal: 2178-2376 Protein: 95-115 gr Fluid: >2200 ml/day  Skin: No issues noted  Diet Order: NPO  EDUCATION NEEDS: -No education needs identified at this time  Intake/Output Summary (Last 24 hours) at 12/09/12 0425 Last data filed at 12/09/12 0400  Gross per 24 hour  Intake      0 ml  Output   1655 ml  Net  -1655 ml    Last BM: PTA   Labs:   Recent Labs Lab 12/08/12 2315 12/09/12 0255  NA 139 143  K 4.3 3.9  CL 99 103  CO2 31 28  BUN 36* 36*   CREATININE 2.78* 2.41*  CALCIUM 9.3 9.1  GLUCOSE 130* 156*    CBG (last 3)   Recent Labs  12/08/12 2303 12/09/12 0410  GLUCAP 127* 146*    Scheduled Meds: . heparin  5,000 Units Subcutaneous Q8H  . insulin aspart  0-15 Units Subcutaneous Q4H  . sodium chloride  3 mL Intravenous Q12H    Continuous Infusions: . sodium chloride 50 mL/hr at 12/09/12 1610    Past Medical History  Diagnosis Date  . Diabetes mellitus   . Hypertension   . CHF (congestive heart failure)   . CRD (chronic renal disease)   . Arthritis   . Obesity   . Depression   . Sleep apnea   . Diabetic neuropathy   . CAD (coronary artery disease)   . Mitral regurgitation   . Esophagitis, reflux 07/07/10    MW tear, erosive reflux esophagitis, 2cm hh  . S/P colonoscopy April 2012    Rourk: torturous colon, hyperplastic polyps  . Collagen vascular disease     "myoclonic "  . Tremor   . Diverticulum   . Hiatal hernia   . GERD (gastroesophageal reflux disease)     erosive  . Gastroparesis 11/2010    No emptying at two hours.   . Chronic back pain   . Clonic seizure     controlled with klonodine    Past Surgical History  Procedure Laterality Date  . Neck surgery      discectomy  . Hand surgery      right-carpal tunnel  . Colonoscopy  before 2002    [place]  . Appendectomy    . Other surgical history      bone graft-knee  . Anterior lumbar corpectomy w/ fusion      C5-6/C4-7  . Colonoscopy  08/21/2010    elongaed tortuous colon otherwise normal  . Esophagogastroduodenoscopy  07/08/10    Dr. Marny Lowenstein esophageal erosions and excoriations consistent with erosive reflux esophagitis, hiatal hernia  . Knee arthroscopy      right  . Esophagogastroduodenoscopy (egd) with propofol  02/25/2012    Procedure: ESOPHAGOGASTRODUODENOSCOPY (EGD) WITH PROPOFOL;  Surgeon: Corbin Ade, MD;  Location: [facility];  Service: Endoscopy;  Laterality: N/A;  7:30 Donald Prose  MS,RD,LDN,CSG Office: [phone] Pager: [phone]

## 2012-12-09 NOTE — ED Notes (Signed)
Pt rolled over on side, in position of comfort. Verbalizations vary from word salad to knowing he's in the hospital, then back to word salad. No significant change with narcan noted

## 2012-12-09 NOTE — Progress Notes (Signed)
Pt place on CPAP on 12cm 2lpm cannpt tolerate well. spo2 99%  Will continue to monitor through out the night.

## 2012-12-09 NOTE — Care Management Note (Unsigned)
    Page 1 of 1   12/09/2012     3:49:05 PM   CARE MANAGEMENT NOTE 12/09/2012  Patient:  [name],[name]   Account Number:  [id number]  Date Initiated:  12/09/2012  Documentation initiated by:  Sharrie Rothman  Subjective/Objective Assessment:   Pt admitted from home with acute encephalopathy. Pt lives with his wife and will return home at discharge. Pt is lethargic at this time and unable to answer any questions.     Action/Plan:   Pts wife called CM and gave some information about pt and that he was a [facility] [place] pt. [facility] [place] notified of pts admission. Pt may transfer to if wife agrees. Pts wife is not agreeing at this time for transfer until she consults with   Anticipated DC Date:  12/12/2012   Anticipated DC Plan:  ACUTE TO ACUTE TRANS      DC Planning Services  CM consult      Choice offered to / List presented to:             Status of service:  In process, will continue to follow Medicare Important Message given?   (If response is "NO", the following Medicare IM given date fields will be blank) Date Medicare IM given:   Date Additional Medicare IM given:    Discharge Disposition:    Per UR Regulation:    If discussed at Long Length of Stay Meetings, dates discussed:    Comments:  12/09/12 1530 Arlyss Queen, RN BSN CM her daughter and insurance company. Weekend staff is aware that pt may transfer once wife agrees. Facility to fax paperwork and fax number given to transfer coordinator.

## 2012-12-09 NOTE — Progress Notes (Signed)
EEG Completed; Results Pending  

## 2012-12-09 NOTE — Progress Notes (Signed)
UR chart review completed.  

## 2012-12-09 NOTE — H&P (Signed)
[facility] History and Physical  [name]:[id number] DOB: Nov 12, 1955 DOA: 12/08/2012   PCP: Gaylene Brooks, MD  Specialists: He is followed by numerous specialists including cardiology, neurology, and nephrology at [facility] and at the [place].  Chief Complaint: Decreased level of consciousness  HPI: [name] is a 57 y.o. male with the numerous medical problems including CHF, CKD, obstructive sleep apnea, myoclonic jerks, who was in his usual state of health till yesterday evening when his wife noticed that he was falling asleep quite a bit. He also appeared to be very tired. And, then he got very confused. He didn't recognize his wife. This happened approximately at 6 PM on Thursday evening. She also noticed at home that his blood pressure was running low. She also noticed some left-sided weakness. Denies, that he's been on any new medications. Denies that he has taken more medications than he should have especially the pain medicine. Denies any seizures. She also noticed that the patient was not speaking properly. She called her daughter, was concerned and so they decided to bring him to the hospital. Patient is very lethargic. He did wake up when his wife spoke to him. He sat up on the bed. He was able to squeeze my fingers, however, he didn't provide any history. And then he went back to sleep. According to the wife he does do this at home. He sleeps for 2 days in a row at times and then wakes up spontaneously. So history is very limited.  Home Medications: Prior to Admission medications   Medication Sig Start Date End Date Taking? Authorizing Provider  baclofen (LIORESAL) 5 mg TABS Take 0.5 tablets (5 mg total) by mouth 3 (three) times daily. 12/24/11   Ripudeep Jenna Luo, MD  ciprofloxacin (CIPRO) 500 MG tablet Take 1 tablet (500 mg total) by mouth 2 (two) times daily. 08/13/12   Benny Lennert, MD  clonazePAM (KLONOPIN) 0.5 MG tablet Take 0.5-1 mg by mouth 2 (two) times  daily. 0.5 mg in the a.m. And 1 mg at night    Historical Provider, MD  dicyclomine (BENTYL) 10 MG capsule Take 1 capsule (10 mg total) by mouth 3 (three) times daily as needed (hold for constipation, use sparingly as can slow down stomach emptying). 02/04/12   Tiffany Kocher, PA-C  FLUoxetine (PROZAC) 40 MG capsule Take 40 mg by mouth 2 (two) times daily.    Historical Provider, MD  furosemide (LASIX) 20 MG tablet Take 40 mg by mouth daily as needed. For fluid    Historical Provider, MD  gabapentin (NEURONTIN) 400 MG capsule Take 400 mg by mouth 3 (three) times daily.    Historical Provider, MD  hydrOXYzine (ATARAX/VISTARIL) 10 MG tablet Take 10 mg by mouth 3 (three) times daily as needed. For itching    Historical Provider, MD  insulin aspart protamine-insulin aspart (NOVOLOG 70/30) (70-30) 100 UNIT/ML injection Inject 10-20 Units into the skin 2 (two) times daily with a meal. Units vary based on food consumed.    Historical Provider, MD  modafinil (PROVIGIL) 100 MG tablet Take 1 tablet (100 mg total) by mouth daily. Please take daily for another 3 days, then increase to twice a day 12/24/11 01/23/12  Ripudeep K Rai, MD  modafinil (PROVIGIL) 200 MG tablet Take 100 mg by mouth 2 (two) times daily.    Historical Provider, MD  morphine (MSIR) 30 MG tablet Take 30 mg by mouth every 4 (four) hours as needed for pain.  Historical Provider, MD  Multiple Vitamins-Minerals (CENTRUM SILVER ULTRA MENS) TABS Take 1 tablet by mouth daily.      Historical Provider, MD  ondansetron (ZOFRAN-ODT) 4 MG disintegrating tablet Take 1 tablet (4 mg total) by mouth every 4 (four) hours as needed for nausea. 01/27/12   Tiffany Kocher, PA-C  pantoprazole (PROTONIX) 40 MG tablet Take 40 mg by mouth daily.    Historical Provider, MD  polyethylene glycol (MIRALAX / GLYCOLAX) packet Take 17 g by mouth daily as needed. For constipation    Historical Provider, MD  promethazine (PHENERGAN) 25 MG suppository Place 1 suppository (25 mg  total) rectally every 6 (six) hours as needed for nausea. 01/27/12   Tiffany Kocher, PA-C  promethazine (PHENERGAN) 25 MG tablet Take 25 mg by mouth every 6 (six) hours as needed. For nausea    Historical Provider, MD  promethazine (PHENERGAN) 25 MG tablet Take 1 tablet (25 mg total) by mouth every 6 (six) hours as needed for nausea. 08/13/12   Benny Lennert, MD  rOPINIRole (REQUIP) 2 MG tablet Take 4 mg by mouth at bedtime.     Historical Provider, MD  traMADol (ULTRAM) 50 MG tablet Take 100 mg by mouth every 6 (six) hours as needed.    Historical Provider, MD    Allergies:  Allergies  Allergen Reactions  . Ms Contin [Morphine Sulfate Er]     Past Medical History: Past Medical History  Diagnosis Date  . Diabetes mellitus   . Hypertension   . CHF (congestive heart failure)   . CRD (chronic renal disease)   . Arthritis   . Obesity   . Depression   . Sleep apnea   . Diabetic neuropathy   . CAD (coronary artery disease)   . Mitral regurgitation   . Esophagitis, reflux 07/07/10    MW tear, erosive reflux esophagitis, 2cm hh  . S/P colonoscopy April 2012    Rourk: torturous colon, hyperplastic polyps  . Collagen vascular disease     "myoclonic "  . Tremor   . Diverticulum   . Hiatal hernia   . GERD (gastroesophageal reflux disease)     erosive  . Gastroparesis 11/2010    No emptying at two hours.   . Chronic back pain   . Clonic seizure     controlled with klonodine    Past Surgical History  Procedure Laterality Date  . Neck surgery      discectomy  . Hand surgery      right-carpal tunnel  . Colonoscopy  before 2002    [place]  . Appendectomy    . Other surgical history      bone graft-knee  . Anterior lumbar corpectomy w/ fusion      C5-6/C4-7  . Colonoscopy  08/21/2010    elongaed tortuous colon otherwise normal  . Esophagogastroduodenoscopy  07/08/10    Dr. Marny Lowenstein esophageal erosions and excoriations consistent with erosive reflux esophagitis, hiatal hernia   . Knee arthroscopy      right  . Esophagogastroduodenoscopy (egd) with propofol  02/25/2012    Procedure: ESOPHAGOGASTRODUODENOSCOPY (EGD) WITH PROPOFOL;  Surgeon: Corbin Ade, MD;  Location: [facility];  Service: Endoscopy;  Laterality: N/A;  7:30 /POLYPHARMACY    Social History:  reports that he has been smoking Cigarettes.  He has a 20 pack-year smoking history. He has never used smokeless tobacco. He reports that he uses illicit drugs (Marijuana). He reports that he does not drink alcohol.  Living Situation: Lives  with his wife Activity Level: Uses a walker to ambulate   Family History:  Family History  Problem Relation Age of Onset  . Stroke Father 21  . Stroke Mother 45  . Colon cancer Neg Hx   . Crohn's disease Neg Hx   . Cirrhosis Neg Hx   . Ulcerative colitis Neg Hx   . Stomach cancer Neg Hx      Review of Systems - unable to do due to to his mental status  Physical Examination  Filed Vitals:   12/08/12 2304 12/08/12 2315 12/08/12 2321 12/09/12 0002  BP: 145/83  143/81 145/93  Pulse: 73 74 74 88  Temp: 98.1 F (36.7 C)     TempSrc: Oral     Resp:  9 18 20   SpO2: 98% 97% 99% 100%    General appearance: distracted, no distress, slowed mentation and uncooperative Head: Normocephalic, without obvious abnormality, atraumatic Eyes: conjunctivae/corneas clear. PERRL, EOM's intact.  Throat: lips, mucosa, and tongue normal; teeth and gums normal Resp: clear to auscultation bilaterally Cardio: regular rate and rhythm, S1, S2 normal, no murmur, click, rub or gallop GI: soft, non-tender; bowel sounds normal; no masses,  no organomegaly Extremities: extremities normal, atraumatic, no cyanosis or edema Pulses: 2+ and symmetric Skin: Skin color, texture, turgor normal. No rashes or lesions Lymph nodes: Cervical, supraclavicular, and axillary nodes normal. Neurologic: He is very somnolent. He did wake up however, did not answer many questions. No facial droop was noted. He  was able to squeeze my fingers, equally with both hands. He was able to lift her legs off the bed. However, further neurological examination was not possible.  Laboratory Data: Results for orders placed during the hospital encounter of 12/08/12 (from the past 48 hour(s))  GLUCOSE, CAPILLARY     Status: Abnormal   Collection Time    12/08/12 11:03 PM      Result Value Range   Glucose-Capillary 127 (*) 70 - 99 mg/dL  CBC WITH DIFFERENTIAL     Status: Abnormal   Collection Time    12/08/12 11:15 PM      Result Value Range   WBC 10.3  4.0 - 10.5 K/uL   RBC 4.54  4.22 - 5.81 MIL/uL   Hemoglobin 12.9 (*) 13.0 - 17.0 g/dL   HCT 16.1  09.6 - 04.5 %   MCV 87.4  78.0 - 100.0 fL   MCH 28.4  26.0 - 34.0 pg   MCHC 32.5  30.0 - 36.0 g/dL   RDW 40.9  81.1 - 91.4 %   Platelets 191  150 - 400 K/uL   Neutrophils Relative % 63  43 - 77 %   Neutro Abs 6.5  1.7 - 7.7 K/uL   Lymphocytes Relative 21  12 - 46 %   Lymphs Abs 2.2  0.7 - 4.0 K/uL   Monocytes Relative 11  3 - 12 %   Monocytes Absolute 1.1 (*) 0.1 - 1.0 K/uL   Eosinophils Relative 4  0 - 5 %   Eosinophils Absolute 0.4  0.0 - 0.7 K/uL   Basophils Relative 1  0 - 1 %   Basophils Absolute 0.1  0.0 - 0.1 K/uL  COMPREHENSIVE METABOLIC PANEL     Status: Abnormal   Collection Time    12/08/12 11:15 PM      Result Value Range   Sodium 139  135 - 145 mEq/L   Potassium 4.3  3.5 - 5.1 mEq/L   Chloride 99  96 -  112 mEq/L   CO2 31  19 - 32 mEq/L   Glucose, Bld 130 (*) 70 - 99 mg/dL   BUN 36 (*) 6 - 23 mg/dL   Creatinine, Ser 1.61 (*) 0.50 - 1.35 mg/dL   Calcium 9.3  8.4 - 09.6 mg/dL   Total Protein 7.2  6.0 - 8.3 g/dL   Albumin 3.7  3.5 - 5.2 g/dL   AST 19  0 - 37 U/L   ALT 19  0 - 53 U/L   Alkaline Phosphatase 105  39 - 117 U/L   Total Bilirubin 0.2 (*) 0.3 - 1.2 mg/dL   GFR calc non Af Amer 24 (*) >90 mL/min   GFR calc Af Amer 27 (*) >90 mL/min   Comment: (NOTE)     The eGFR has been calculated using the CKD EPI equation.     This  calculation has not been validated in all clinical situations.     eGFR's persistently <90 mL/min signify possible Chronic Kidney     Disease.  TROPONIN I     Status: None   Collection Time    12/08/12 11:15 PM      Result Value Range   Troponin I <0.30  <0.30 ng/mL   Comment:            Due to the release kinetics of cTnI,     a negative result within the first hours     of the onset of symptoms does not rule out     myocardial infarction with certainty.     If myocardial infarction is still suspected,     repeat the test at appropriate intervals.    Radiology Reports: Dg Chest 1 View  12/09/2012   *RADIOLOGY REPORT*  Clinical Data: Left-sided weakness.  CHEST - 1 VIEW  Comparison: 08/13/2012  Findings: Borderline cardiomegaly, accentuated by low lung volumes. No change from prior.  Interstitial crowding without infiltrate, edema, effusion, pneumothorax.  Unchanged density increase at the first costochondral junctions.  Lower cervical to the upper thoracic plate.  IMPRESSION: Low volume lungs without evidence of acute cardiopulmonary disease.   Original Report Authenticated By: Tiburcio Pea   Ct Head Wo Contrast  12/09/2012   *RADIOLOGY REPORT*  Clinical Data: Altered mental status, left-sided weakness  CT HEAD WITHOUT CONTRAST  Technique:  Contiguous axial images were obtained from the base of the skull through the vertex without contrast.  Comparison: 12/20/2011  Findings: No acute hemorrhage, acute infarction, or mass lesion is identified.  No midline shift.  No ventriculomegaly.  No skull fracture.  Orbits and paranasal sinuses are intact.  IMPRESSION: No acute intracranial finding.   Original Report Authenticated By: Christiana Pellant, M.D.    Electrocardiogram: Sinus rhythm at 69 beats per minute. Left axis deviation. Possible Q wave in lead 3. Nonspecific T-wave changes in 1 and aVL. No acute ST segment changes are noted. Similar to EKG from the past  Problem List  Principal  Problem:   Acute encephalopathy Active Problems:   NECK PAIN, CHRONIC   CKD (chronic kidney disease), stage III   OSA on CPAP   Chronic systolic heart failure   HTN (hypertension)   DM type 2 with diabetic peripheral neuropathy   Polypharmacy   Myoclonic jerking   Assessment: This is a 57 year old, Caucasian male, who presents with altered mental status. The reason for his increased somnolence is not entirely clear. He could have had a TIA or stroke. This could be a manifestation of his medication  use. Infection cannot be ruled out. Although he is afebrile. If this is a stroke he is outside the window for TPA.  Plan: #1 acute encephalopathy: We will get MRI of the brain in the morning. A UA will be checked. A urine drug screen is pending. EEG will be ordered and neurology will be consulted. We will keep him off of his medications for now. CPAP will be applied. Will get ABG. Check an ammonia level  #2 history chronic kidney disease, stage III. Creatinine, appears to be slightly worse than his baseline. We will give him IV fluids. Repeat his renal function in the morning.  #3 history of obstructive sleep apnea: Continue with CPAP.  #4 history of diabetes mellitus, type II: Check HbA1c. Place him on a sliding scale coverage. He'll be kept n.p.o. for now.  #5 history of hypertension: Monitor blood pressures closely. Hold his antihypertensive regimen due to his low pressures at home.   Pharmacy will need to do medication reconciliation before we can prescribe his home medicines.  DVT Prophylaxis: Heparin Code Status: Full code Family Communication: Discussed with his wife and his daughter  Disposition Plan: Admit to step down for tonight   Further management decisions will depend on results of further testing and patient's response to treatment.  [facility]  [facility] Pager [phone]  If 7PM-7AM, please contact night-coverage [email or web address] Password [email or web address]  12/09/2012,  1:06 AM

## 2012-12-09 NOTE — Progress Notes (Signed)
Patient was admitted to the hospital by Dr. Rito Ehrlich.  Patient seen and examined.  He has been admitted for lethargy and altered mental status.  Etiology is not entirely clear.  MRI done of the brain did not show any acute findings.  Ammonia is normal.  pco2 is elevated, but pH is normal.  He is on multiple sedating medications, including morphine, clonazepam and baclofen.  No improvement with narcan.  Urinalysis is unremarkable as is chest xray.  Will continue to hydration and neuro checks.  Patient's wife does not wish that patient be seen by Dr. Gerilyn Pilgrim, neurology.  Will continue to follow closely.  [name]

## 2012-12-09 NOTE — ED Notes (Signed)
Please ignore O2 sat of 67%, sat probe not on pt

## 2012-12-10 DIAGNOSIS — N179 Acute kidney failure, unspecified: Secondary | ICD-10-CM

## 2012-12-10 DIAGNOSIS — R4182 Altered mental status, unspecified: Secondary | ICD-10-CM

## 2012-12-10 LAB — CBC
HCT: 36.4 % — ABNORMAL LOW (ref 39.0–52.0)
Hemoglobin: 11.9 g/dL — ABNORMAL LOW (ref 13.0–17.0)
MCH: 28 pg (ref 26.0–34.0)
MCHC: 32.7 g/dL (ref 30.0–36.0)
RBC: 4.25 MIL/uL (ref 4.22–5.81)

## 2012-12-10 LAB — URINE CULTURE
Colony Count: NO GROWTH
Colony Count: NO GROWTH

## 2012-12-10 LAB — BLOOD GAS, ARTERIAL
Acid-Base Excess: 3.6 mmol/L — ABNORMAL HIGH (ref 0.0–2.0)
Bicarbonate: 27.8 mEq/L — ABNORMAL HIGH (ref 20.0–24.0)
Delivery systems: POSITIVE
O2 Saturation: 95.7 %
TCO2: 25 mmol/L (ref 0–100)
pO2, Arterial: 83.2 mmHg (ref 80.0–100.0)

## 2012-12-10 LAB — GLUCOSE, CAPILLARY
Glucose-Capillary: 118 mg/dL — ABNORMAL HIGH (ref 70–99)
Glucose-Capillary: 128 mg/dL — ABNORMAL HIGH (ref 70–99)

## 2012-12-10 LAB — BASIC METABOLIC PANEL
BUN: 26 mg/dL — ABNORMAL HIGH (ref 6–23)
Chloride: 105 mEq/L (ref 96–112)
Glucose, Bld: 122 mg/dL — ABNORMAL HIGH (ref 70–99)
Potassium: 3.8 mEq/L (ref 3.5–5.1)

## 2012-12-10 NOTE — Discharge Summary (Signed)
Physician Discharge Summary  [name] ZOX:[id number] DOB: Mar 05, 1956 DOA: 12/08/2012  PCP: Larry Brooks, MD  Admit date: 12/08/2012 Discharge date: 12/10/2012  Time spent: Greater than 30 minutes  Recommendations for Outpatient Follow-up:  1. Followup with primary care physician to review and possibly adjust all his medications.   Discharge Diagnoses:  1. Acute encephalopathy secondary to medications. Resolved. 2. Acute on chronic kidney failure, improved. Creatinine 1.63 on discharge. 3. Obstructive sleep apnea, has not used CPAP at home, awaiting a new one. 4. Hypertension. 5. Type 2 diabetes mellitus. 6. Polypharmacy. 7. Chronic pain syndrome.   Discharge Condition: Stable and improved.  Diet recommendation: Carbohydrate modified diet.  Filed Weights   12/09/12 0211 12/10/12 0429  Weight: 99.6 kg (219 lb 9.3 oz) 98.5 kg (217 lb 2.5 oz)    History of present illness:  This 57 year old veteran was admitted with altered mental status. Please see initial history as outlined below: HPI: [name] is a 57 y.o. male with the numerous medical problems including CHF, CKD, obstructive sleep apnea, myoclonic jerks, who was in his usual state of health till yesterday evening when his wife noticed that he was falling asleep quite a bit. He also appeared to be very tired. And, then he got very confused. He didn't recognize his wife. This happened approximately at 6 PM on Thursday evening. She also noticed at home that his blood pressure was running low. She also noticed some left-sided weakness. Denies, that he's been on any new medications. Denies that he has taken more medications than he should have especially the pain medicine. Denies any seizures. She also noticed that the patient was not speaking properly. She called her daughter, was concerned and so they decided to bring him to the hospital. Patient is very lethargic. He did wake up when his wife spoke to him. He sat up on the  bed. He was able to squeeze my fingers, however, he didn't provide any history. And then he went back to sleep. According to the wife he does do this at home. He sleeps for 2 days in a row at times and then wakes up spontaneously. So history is very limited.  Hospital Course:  The patient was monitored in the step down unit and several medications were withheld. He was given IV fluids as it was felt that he was dehydrated and in acute on chronic kidney disease. He swears that he does not overmedicated himself but I am suspicious of this. In any case, he has improved significantly and appears to be at his baseline now. There was no source of infection to account result of mental status. His keen to go home. I've strongly recommended to him to followup with his primary care physician to review all his medications, several of which are sedating.  Procedures:  None.   Consultations:  None.  Discharge Exam: Filed Vitals:   12/10/12 0700  BP: 147/71  Pulse:   Temp:   Resp: 11    General: He looks systemically well. Cardiovascular: Heart sounds are present and normal without murmurs or added sounds. Respiratory: Lung fields are clear. He is alert and orientated without any focal neurological signs.  Discharge Instructions  Discharge Orders   Future Orders Complete By Expires   Diet - low sodium heart healthy  As directed    Increase activity slowly  As directed        Medication List    STOP taking these medications  ciprofloxacin 500 MG tablet  Commonly known as:  CIPRO      TAKE these medications       baclofen 5 mg Tabs tablet  Commonly known as:  LIORESAL  Take 0.5 tablets (5 mg total) by mouth 3 (three) times daily.     CENTRUM SILVER ULTRA MENS Tabs  Take 1 tablet by mouth daily.     clonazePAM 0.5 MG tablet  Commonly known as:  KLONOPIN  Take 0.5-1 mg by mouth 2 (two) times daily. 0.5 mg in the a.m. And 1 mg at night     dicyclomine 10 MG capsule   Commonly known as:  BENTYL  Take 1 capsule (10 mg total) by mouth 3 (three) times daily as needed (hold for constipation, use sparingly as can slow down stomach emptying).     FLUoxetine 40 MG capsule  Commonly known as:  PROZAC  Take 40 mg by mouth 2 (two) times daily.     furosemide 20 MG tablet  Commonly known as:  LASIX  Take 40 mg by mouth daily as needed. For fluid     gabapentin 400 MG capsule  Commonly known as:  NEURONTIN  Take 400 mg by mouth 3 (three) times daily.     hydrOXYzine 10 MG tablet  Commonly known as:  ATARAX/VISTARIL  Take 10 mg by mouth 3 (three) times daily as needed. For itching     insulin aspart protamine- aspart (70-30) 100 UNIT/ML injection  Commonly known as:  NOVOLOG MIX 70/30  Inject 10-20 Units into the skin 2 (two) times daily with a meal. Units vary based on food consumed.     modafinil 100 MG tablet  Commonly known as:  PROVIGIL  Take 1 tablet (100 mg total) by mouth daily. Please take daily for another 3 days, then increase to twice a day     modafinil 200 MG tablet  Commonly known as:  PROVIGIL  Take 100 mg by mouth 2 (two) times daily.     morphine 30 MG tablet  Commonly known as:  MSIR  Take 30 mg by mouth every 4 (four) hours as needed for pain.     ondansetron 4 MG disintegrating tablet  Commonly known as:  ZOFRAN-ODT  Take 1 tablet (4 mg total) by mouth every 4 (four) hours as needed for nausea.     pantoprazole 40 MG tablet  Commonly known as:  PROTONIX  Take 40 mg by mouth daily.     polyethylene glycol packet  Commonly known as:  MIRALAX / GLYCOLAX  Take 17 g by mouth daily as needed. For constipation     promethazine 25 MG suppository  Commonly known as:  PHENERGAN  Place 1 suppository (25 mg total) rectally every 6 (six) hours as needed for nausea.     promethazine 25 MG tablet  Commonly known as:  PHENERGAN  Take 25 mg by mouth every 6 (six) hours as needed. For nausea     rOPINIRole 2 MG tablet  Commonly  known as:  REQUIP  Take 4 mg by mouth at bedtime.     traMADol 50 MG tablet  Commonly known as:  ULTRAM  Take 100 mg by mouth every 6 (six) hours as needed.       Allergies  Allergen Reactions  . Ms Contin [Morphine Sulfate Er]        Follow-up Information   Follow up with Larry Brooks, MD In 2 days. (Followup with your primary care physician to adjust  your medications.)    Contact information:   [address] [place] [address] [phone]        The results of significant diagnostics from this hospitalization (including imaging, microbiology, ancillary and laboratory) are listed below for reference.    Significant Diagnostic Studies: Dg Chest 1 View  12/09/2012   *RADIOLOGY REPORT*  Clinical Data: Left-sided weakness.  CHEST - 1 VIEW  Comparison: 08/13/2012  Findings: Borderline cardiomegaly, accentuated by low lung volumes. No change from prior.  Interstitial crowding without infiltrate, edema, effusion, pneumothorax.  Unchanged density increase at the first costochondral junctions.  Lower cervical to the upper thoracic plate.  IMPRESSION: Low volume lungs without evidence of acute cardiopulmonary disease.   Original Report Authenticated By: Tiburcio Pea   Ct Head Wo Contrast  12/09/2012   *RADIOLOGY REPORT*  Clinical Data: Altered mental status, left-sided weakness  CT HEAD WITHOUT CONTRAST  Technique:  Contiguous axial images were obtained from the base of the skull through the vertex without contrast.  Comparison: 12/20/2011  Findings: No acute hemorrhage, acute infarction, or mass lesion is identified.  No midline shift.  No ventriculomegaly.  No skull fracture.  Orbits and paranasal sinuses are intact.  IMPRESSION: No acute intracranial finding.   Original Report Authenticated By: Christiana Pellant, M.D.   Mr Brain Wo Contrast  12/09/2012   *RADIOLOGY REPORT*  Clinical Data:  Altered mental status. History of hypertension, diabetes, and morbid obesity.  MRI HEAD  WITHOUT CONTRAST  Technique:  Multiplanar, multiecho pulse sequences of the brain and surrounding structures were obtained according to standard protocol without intravenous contrast.  Comparison: CT head 12/08/2012.  Prior MR 02/16/2011.  Findings: The patient had difficulty remaining motionless for the study.  Images are suboptimal.  Small or subtle lesions could be overlooked.  There is no evidence for acute infarction, intracranial hemorrhage, mass lesion, hydrocephalus, or extra-axial fluid. Mild premature for age atrophy. Minimal white matter disease.  No definite foci of chronic hemorrhage. Flow voids appear to be maintained in the carotid and basilar arteries.  No obvious large vessel infarct or midline shift.  Normal appearing pituitary and cerebellar tonsils. No definite osseous lesion.  No acute sinus or mastoid disease. Chronic right orbital inferior blowout process.  IMPRESSION: Motion degraded examination demonstrating mild premature for age atrophy. No definite acute intracranial findings. Similar appearance to priors.   Original Report Authenticated By: Davonna Belling, M.D.    Microbiology: Recent Results (from the past 240 hour(s))  MRSA PCR SCREENING     Status: None   Collection Time    12/09/12  2:15 AM      Result Value Range Status   MRSA by PCR NEGATIVE  NEGATIVE Final   Comment:            The GeneXpert MRSA Assay (FDA     approved for NASAL specimens     only), is one component of a     comprehensive MRSA colonization     surveillance program. It is not     intended to diagnose MRSA     infection nor to guide or     monitor treatment for     MRSA infections.     Labs: Basic Metabolic Panel:  Recent Labs Lab 12/08/12 2315 12/09/12 0255 12/10/12 0530  NA 139 143 142  K 4.3 3.9 3.8  CL 99 103 105  CO2 31 28 28   GLUCOSE 130* 156* 122*  BUN 36* 36* 26*  CREATININE 2.78* 2.41* 1.63*  CALCIUM 9.3 9.1 8.8   Liver Function Tests:  Recent Labs Lab 12/08/12 2315  12/09/12 0255  AST 19 21  ALT 19 19  ALKPHOS 105 110  BILITOT 0.2* 0.3  PROT 7.2 7.0  ALBUMIN 3.7 3.5     Recent Labs Lab 12/09/12 0255  AMMONIA 29   CBC:  Recent Labs Lab 12/08/12 2315 12/09/12 0255 12/10/12 0530  WBC 10.3 11.2* 6.0  NEUTROABS 6.5  --   --   HGB 12.9* 13.1 11.9*  HCT 39.7 40.5 36.4*  MCV 87.4 87.3 85.6  PLT 191 191 191   Cardiac Enzymes:  Recent Labs Lab 12/08/12 2315  TROPONINI <0.30   BNP: BNP (last 3 results)  Recent Labs  05/06/12 2325  PROBNP 3862.0*   CBG:  Recent Labs Lab 12/09/12 1646 12/09/12 1956 12/10/12 0057 12/10/12 0428 12/10/12 0741  GLUCAP 116* 170* 118* 122* 128*       Signed:  GOSRANI,NIMISH C  [facility] 12/10/2012, 8:37 AM

## 2012-12-10 NOTE — Progress Notes (Signed)
Patient discharged with instructions.  He and his wife verbalized understanding.  The patient left the [facility] via w/c with staff and family in stable condition.

## 2012-12-12 NOTE — Progress Notes (Signed)
UR chart review completed.  

## 2012-12-15 NOTE — Procedures (Signed)
[facility] Kemia Wendel A. Gerilyn Pilgrim, MD     [email or web address]        NAME:  [name], [name]                ACCOUNT NO.:  [id number]  MEDICAL RECORD NO.:  [id number]  LOCATION:  EE                           FACILITY:  [facility]  PHYSICIAN:  Andriana Casa A. Gerilyn Pilgrim, M.D. DATE OF BIRTH:  01-Aug-1955  DATE OF PROCEDURE: DATE OF DISCHARGE:  12/09/2012                             EEG INTERPRETATION   INDICATIONS:  A 57 year old who presents with episode of altered mental status and fallen asleep associated with the patient having what appears to be left-sided weakness.  Again, there is significant confusion.  The study is being done to evaluate for seizures.  MEDICATIONS:  Heparin, insulin, Zofran.  ANALYSIS:  A 16 channel recording is conducted for 25 minutes.  The posterior background activity gets as high as 6.5 hertz.  K complexes are seen throughout the recording, indicating stage II non-REM sleep. There is no focal or lateralized slowing.  Photic stimulation is carried out without abnormal changes in the background activity. Hyperventilation is not carried out.  Again, there is no focal or lateralized slowing.  There is no epileptiform activity observed.  IMPRESSION:  Moderate generalized slowing, however, there is no epileptiform activity observed.     Paulmichael Schreck A. Gerilyn Pilgrim, M.D.     KAD/MEDQ  D:  12/15/2012  T:  12/15/2012  Job:  [id number]

## 2013-04-06 IMAGING — CT CT HEAD W/O CM
3 of 4 series · 15 of 47 positions shown, 18 images · non-contrast
Comparison: 03/04/2007; brain MRI - 11/23/2008

CT HEAD

CLINICAL DATA: Headache, post fall, right infraorbital pain

CT HEAD WITHOUT CONTRAST
CT MAXILLOFACIAL WITHOUT CONTRAST
TECHNIQUE: Multidetector CT imaging of the head and maxillofacial
structures were performed using the standard protocol without
intravenous contrast. Multiplanar CT image reconstructions of the
maxillofacial structures were also generated.

[Series 4: max st 2.0 h31s · axial · 0.36mm/px · z∈[+53,+207]mm · 9 of 95 slices shown, 12 images]
[im 9/95  brain]
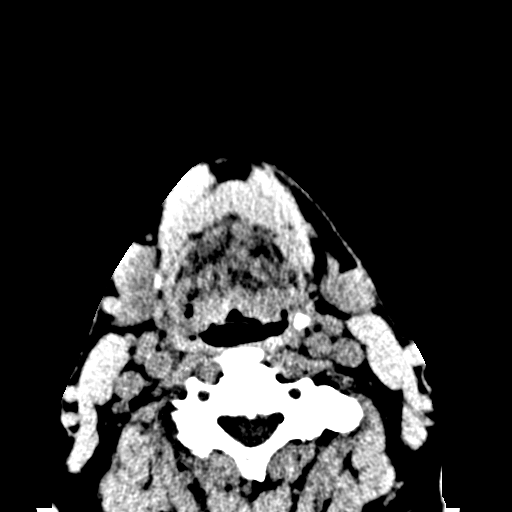
[im 9/95  bone]
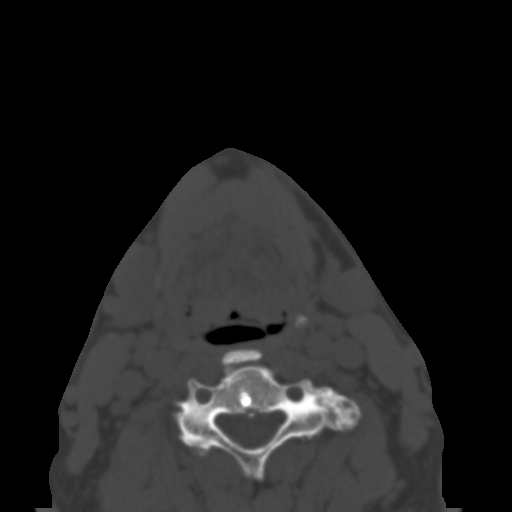
[im 18/95  brain]
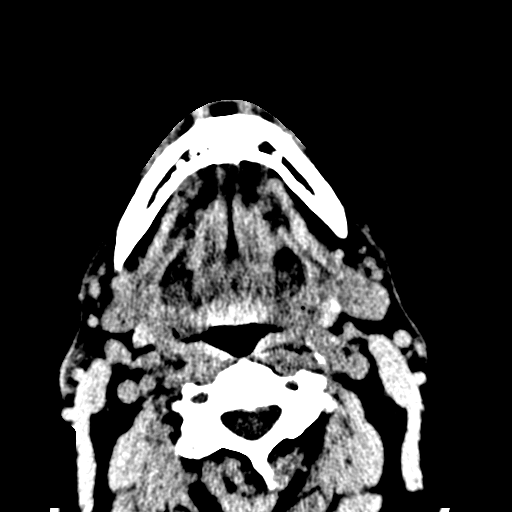
[im 27/95  brain]
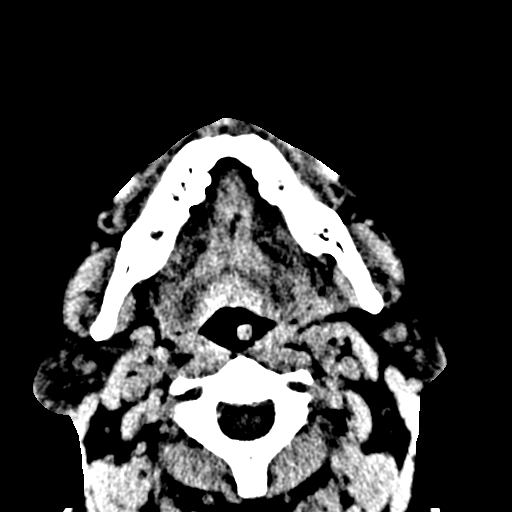
[im 36/95  brain]
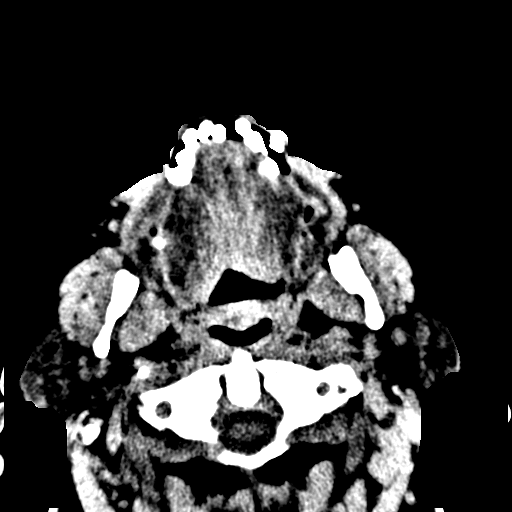
[im 50/95  brain]
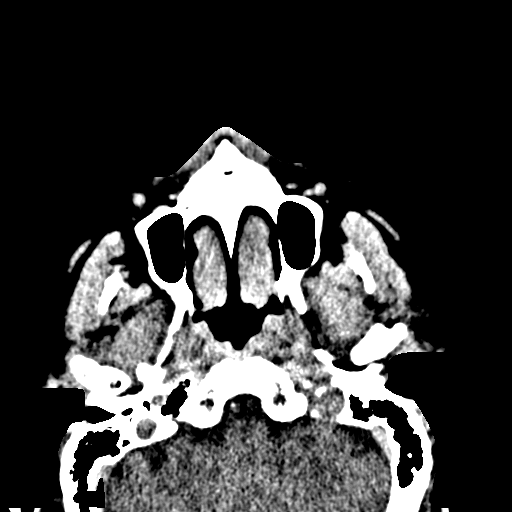
[im 50/95  bone]
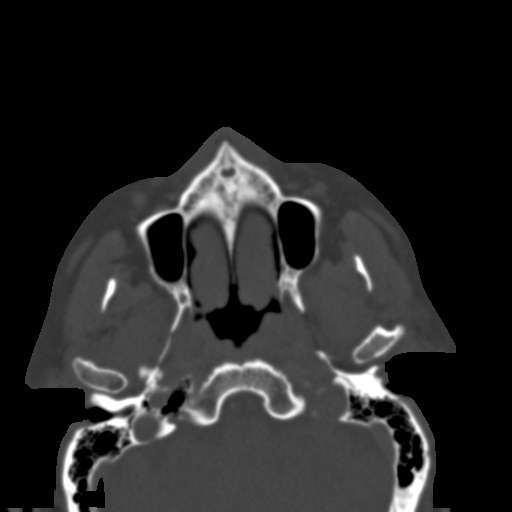
[im 59/95  brain]
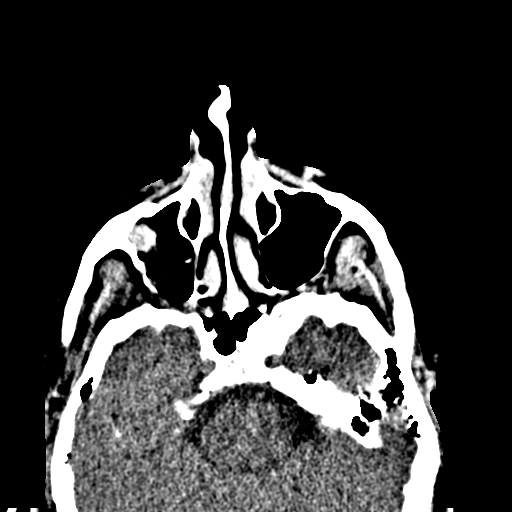
[im 68/95  brain]
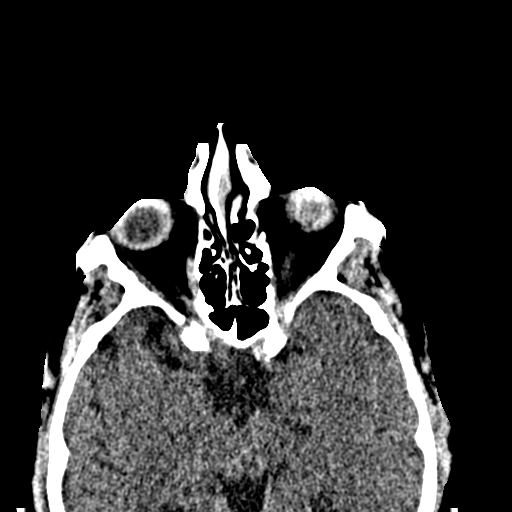
[im 77/95  brain]
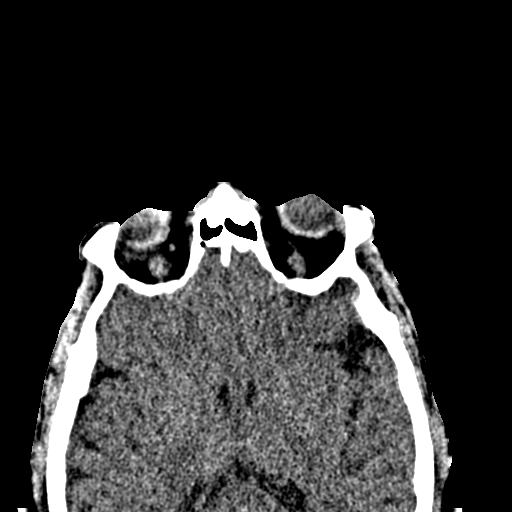
[im 86/95  brain]
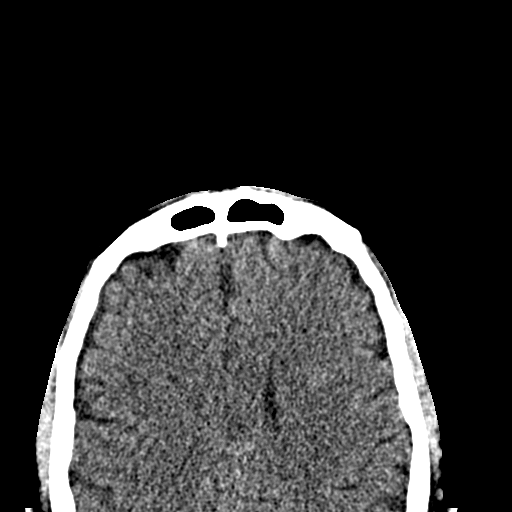
[im 86/95  bone]
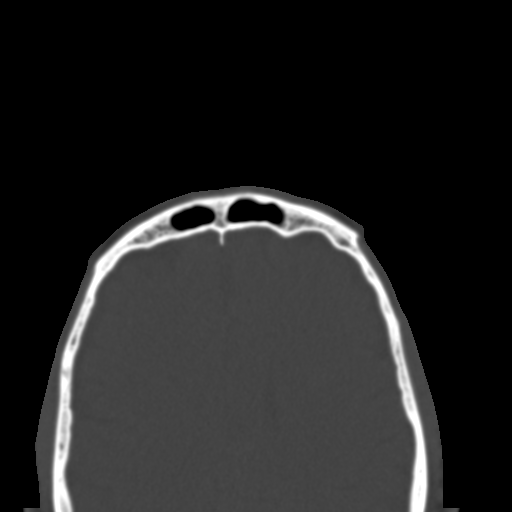

[Series 6: max st coronal · coronal · 0.36mm/px · 3 of 76 slices shown]
[im 26/76  brain]
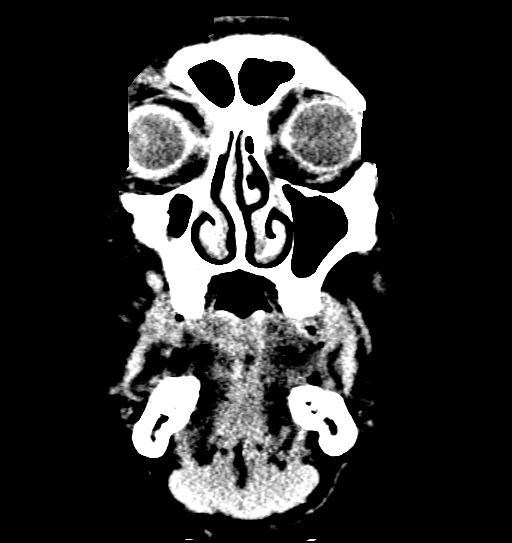
[im 34/76  brain]
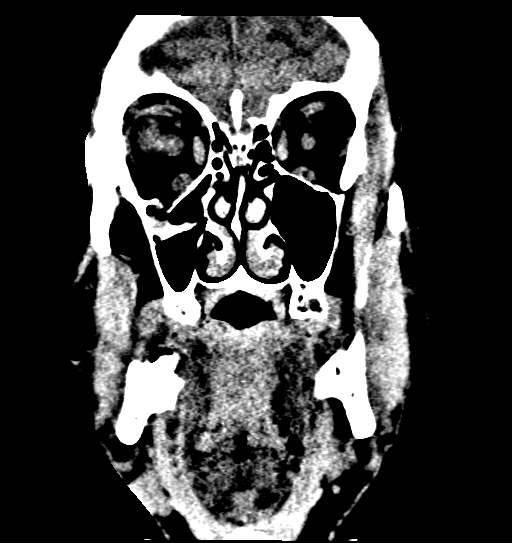
[im 42/76  brain]
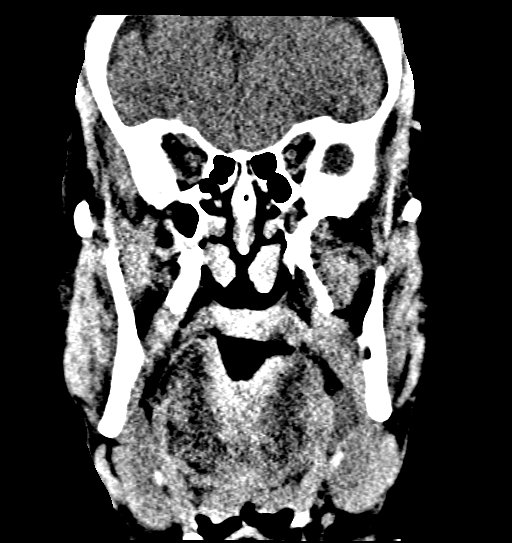

[Series 7: max st sag · sagittal · 0.37mm/px · 3 of 84 slices shown]
[im 28/84  brain]
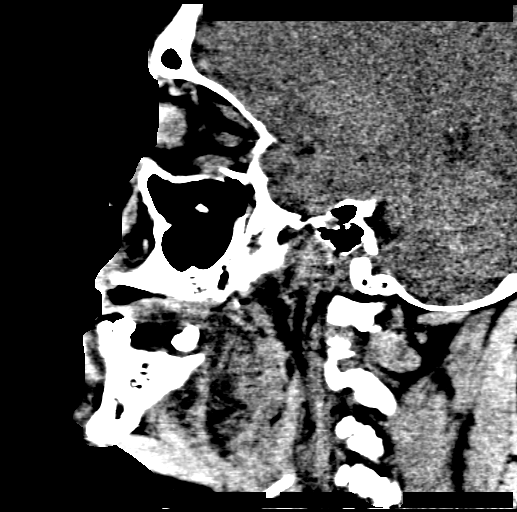
[im 42/84  brain]
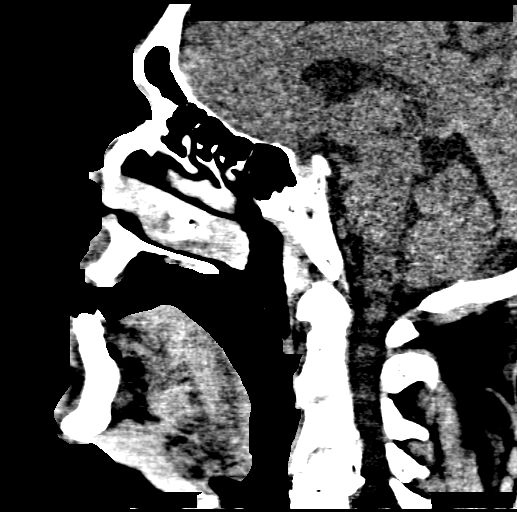
[im 56/84  brain]
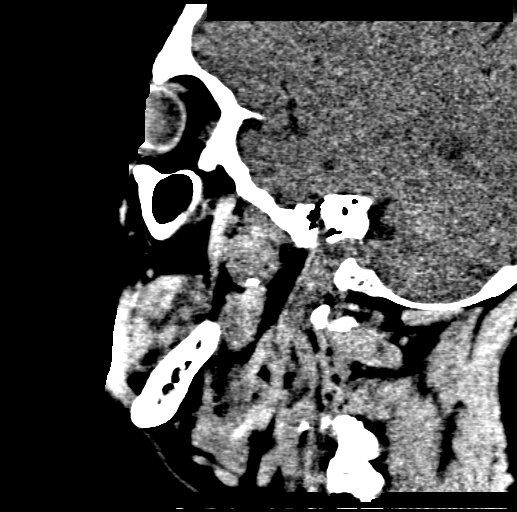

[15 of 47 positions shown; findings below may reference images not displayed]

FINDINGS: Gray white differentiation is maintained.  No CT evidence
of acute large territory infarct.  No intraparenchymal or extra-
axial mass or hemorrhage.  Normal size and configuration of the
ventricles and basilar cisterns.  No midline shift.  Minimal
vascular calcifications within the carotid siphons.  No calvarial
fracture.  Regional soft tissues are normal.
IMPRESSION: Normal noncontrast head CT.

CT MAXILLOFACIAL
FINDINGS: There is a minimally displaced fracture of the inferior
wall of the right orbit which appears to extend to involve the
infraorbital foramen.  This finding is associated mucosal
thickening within the right maxillary sinus.  No air fluid level.
There is no definite entrapment of the inferior rectus. The globe
is intact.  No retrobulbar hematoma.  Regional soft tissues are
normal.  No radiopaque foreign body.

No other fractures are identified.  There is minimal rightward
deviation of the nasal septum.  The remaining paranasal sinuses and
mastoid air cells are normal.  The bilateral pterygoid plates are
normal.  Limited visualization of the cervical spine and
demonstrates C4 ACDF, incompletely imaged.  The dens is normally
seated and the lateral masses of C1.  Normal atlanto-odontoid
atlantoaxial articulations.  Imaged intracranial structures are
normal.  Calcifications within the carotid siphons.
IMPRESSION: Minimally displaced fracture the inferior wall of the right orbit
with potential involvement of the infraorbital foramen.  No
definite entrapment of the inferior rectus musculature.

Above findings discussed with Dr. Gusenok at 2534.

## 2013-05-30 ENCOUNTER — Inpatient Hospital Stay (HOSPITAL_COMMUNITY)
Admission: EM | Admit: 2013-05-30 | Discharge: 2013-06-04 | DRG: 291 | Disposition: A | Discharge status: Home Health | Payer: Medicare HMO | Attending: Family Medicine | Admitting: Family Medicine

## 2013-05-30 ENCOUNTER — Emergency Department (HOSPITAL_COMMUNITY): Payer: Medicare HMO

## 2013-05-30 ENCOUNTER — Encounter (HOSPITAL_COMMUNITY): Payer: Self-pay | Admitting: Emergency Medicine

## 2013-05-30 DIAGNOSIS — I2510 Atherosclerotic heart disease of native coronary artery without angina pectoris: Secondary | ICD-10-CM | POA: Diagnosis present

## 2013-05-30 DIAGNOSIS — I428 Other cardiomyopathies: Secondary | ICD-10-CM

## 2013-05-30 DIAGNOSIS — N183 Chronic kidney disease, stage 3 unspecified: Secondary | ICD-10-CM | POA: Diagnosis present

## 2013-05-30 DIAGNOSIS — I5021 Acute systolic (congestive) heart failure: Secondary | ICD-10-CM | POA: Diagnosis present

## 2013-05-30 DIAGNOSIS — G9340 Encephalopathy, unspecified: Secondary | ICD-10-CM

## 2013-05-30 DIAGNOSIS — I5023 Acute on chronic systolic (congestive) heart failure: Principal | ICD-10-CM | POA: Diagnosis present

## 2013-05-30 DIAGNOSIS — N189 Chronic kidney disease, unspecified: Secondary | ICD-10-CM

## 2013-05-30 DIAGNOSIS — I519 Heart disease, unspecified: Secondary | ICD-10-CM

## 2013-05-30 DIAGNOSIS — I1310 Hypertensive heart and chronic kidney disease without heart failure, with stage 1 through stage 4 chronic kidney disease, or unspecified chronic kidney disease: Secondary | ICD-10-CM

## 2013-05-30 DIAGNOSIS — G253 Myoclonus: Secondary | ICD-10-CM | POA: Diagnosis present

## 2013-05-30 DIAGNOSIS — T4275XA Adverse effect of unspecified antiepileptic and sedative-hypnotic drugs, initial encounter: Secondary | ICD-10-CM | POA: Diagnosis present

## 2013-05-30 DIAGNOSIS — N184 Chronic kidney disease, stage 4 (severe): Secondary | ICD-10-CM

## 2013-05-30 DIAGNOSIS — I509 Heart failure, unspecified: Secondary | ICD-10-CM

## 2013-05-30 DIAGNOSIS — M549 Dorsalgia, unspecified: Secondary | ICD-10-CM | POA: Diagnosis present

## 2013-05-30 DIAGNOSIS — D649 Anemia, unspecified: Secondary | ICD-10-CM | POA: Diagnosis present

## 2013-05-30 DIAGNOSIS — E785 Hyperlipidemia, unspecified: Secondary | ICD-10-CM

## 2013-05-30 DIAGNOSIS — Z823 Family history of stroke: Secondary | ICD-10-CM

## 2013-05-30 DIAGNOSIS — F17200 Nicotine dependence, unspecified, uncomplicated: Secondary | ICD-10-CM | POA: Diagnosis present

## 2013-05-30 DIAGNOSIS — G929 Unspecified toxic encephalopathy: Secondary | ICD-10-CM | POA: Diagnosis present

## 2013-05-30 DIAGNOSIS — G8929 Other chronic pain: Secondary | ICD-10-CM | POA: Diagnosis present

## 2013-05-30 DIAGNOSIS — Z9989 Dependence on other enabling machines and devices: Secondary | ICD-10-CM

## 2013-05-30 DIAGNOSIS — E876 Hypokalemia: Secondary | ICD-10-CM | POA: Diagnosis present

## 2013-05-30 DIAGNOSIS — K3184 Gastroparesis: Secondary | ICD-10-CM | POA: Diagnosis present

## 2013-05-30 DIAGNOSIS — I10 Essential (primary) hypertension: Secondary | ICD-10-CM

## 2013-05-30 DIAGNOSIS — T481X5A Adverse effect of skeletal muscle relaxants [neuromuscular blocking agents], initial encounter: Secondary | ICD-10-CM | POA: Diagnosis present

## 2013-05-30 DIAGNOSIS — G4733 Obstructive sleep apnea (adult) (pediatric): Secondary | ICD-10-CM | POA: Diagnosis present

## 2013-05-30 DIAGNOSIS — K219 Gastro-esophageal reflux disease without esophagitis: Secondary | ICD-10-CM | POA: Diagnosis present

## 2013-05-30 DIAGNOSIS — E1142 Type 2 diabetes mellitus with diabetic polyneuropathy: Secondary | ICD-10-CM | POA: Diagnosis present

## 2013-05-30 DIAGNOSIS — Z794 Long term (current) use of insulin: Secondary | ICD-10-CM

## 2013-05-30 DIAGNOSIS — N179 Acute kidney failure, unspecified: Secondary | ICD-10-CM | POA: Diagnosis present

## 2013-05-30 DIAGNOSIS — T43205A Adverse effect of unspecified antidepressants, initial encounter: Secondary | ICD-10-CM | POA: Diagnosis present

## 2013-05-30 DIAGNOSIS — I5189 Other ill-defined heart diseases: Secondary | ICD-10-CM

## 2013-05-30 DIAGNOSIS — I2589 Other forms of chronic ischemic heart disease: Secondary | ICD-10-CM | POA: Diagnosis present

## 2013-05-30 DIAGNOSIS — G4730 Sleep apnea, unspecified: Secondary | ICD-10-CM

## 2013-05-30 DIAGNOSIS — T424X5A Adverse effect of benzodiazepines, initial encounter: Secondary | ICD-10-CM | POA: Diagnosis present

## 2013-05-30 DIAGNOSIS — M129 Arthropathy, unspecified: Secondary | ICD-10-CM | POA: Diagnosis present

## 2013-05-30 DIAGNOSIS — G92 Toxic encephalopathy: Secondary | ICD-10-CM | POA: Diagnosis present

## 2013-05-30 DIAGNOSIS — R4182 Altered mental status, unspecified: Secondary | ICD-10-CM

## 2013-05-30 DIAGNOSIS — R9431 Abnormal electrocardiogram [ECG] [EKG]: Secondary | ICD-10-CM

## 2013-05-30 DIAGNOSIS — E1149 Type 2 diabetes mellitus with other diabetic neurological complication: Secondary | ICD-10-CM | POA: Diagnosis present

## 2013-05-30 LAB — CBC WITH DIFFERENTIAL/PLATELET
BASOS ABS: 0 10*3/uL (ref 0.0–0.1)
BASOS PCT: 0 % (ref 0–1)
EOS PCT: 1 % (ref 0–5)
Eosinophils Absolute: 0.1 10*3/uL (ref 0.0–0.7)
HEMATOCRIT: 35.7 % — AB (ref 39.0–52.0)
Hemoglobin: 12 g/dL — ABNORMAL LOW (ref 13.0–17.0)
LYMPHS PCT: 17 % (ref 12–46)
Lymphs Abs: 1.6 10*3/uL (ref 0.7–4.0)
MCH: 29.5 pg (ref 26.0–34.0)
MCHC: 33.6 g/dL (ref 30.0–36.0)
MCV: 87.7 fL (ref 78.0–100.0)
MONO ABS: 0.7 10*3/uL (ref 0.1–1.0)
Monocytes Relative: 7 % (ref 3–12)
NEUTROS ABS: 6.6 10*3/uL (ref 1.7–7.7)
Neutrophils Relative %: 75 % (ref 43–77)
PLATELETS: 200 10*3/uL (ref 150–400)
RBC: 4.07 MIL/uL — ABNORMAL LOW (ref 4.22–5.81)
RDW: 15.3 % (ref 11.5–15.5)
WBC: 9 10*3/uL (ref 4.0–10.5)

## 2013-05-30 LAB — COMPREHENSIVE METABOLIC PANEL
ALBUMIN: 3.6 g/dL (ref 3.5–5.2)
ALT: 17 U/L (ref 0–53)
AST: 19 U/L (ref 0–37)
Alkaline Phosphatase: 111 U/L (ref 39–117)
BUN: 29 mg/dL — ABNORMAL HIGH (ref 6–23)
CALCIUM: 8.9 mg/dL (ref 8.4–10.5)
CO2: 28 mEq/L (ref 19–32)
CREATININE: 2.09 mg/dL — AB (ref 0.50–1.35)
Chloride: 98 mEq/L (ref 96–112)
GFR calc Af Amer: 39 mL/min — ABNORMAL LOW (ref >90)
GFR calc non Af Amer: 33 mL/min — ABNORMAL LOW (ref >90)
Glucose, Bld: 223 mg/dL — ABNORMAL HIGH (ref 70–99)
Potassium: 4.3 mEq/L (ref 3.7–5.3)
SODIUM: 140 meq/L (ref 137–147)
Total Bilirubin: 0.4 mg/dL (ref 0.3–1.2)
Total Protein: 7.1 g/dL (ref 6.0–8.3)

## 2013-05-30 LAB — PRO B NATRIURETIC PEPTIDE: Pro B Natriuretic peptide (BNP): 9807 pg/mL — ABNORMAL HIGH (ref 0–125)

## 2013-05-30 LAB — TROPONIN I

## 2013-05-30 MED ORDER — FENTANYL CITRATE 0.05 MG/ML IJ SOLN
50.0000 ug | Freq: Once | INTRAMUSCULAR | Status: AC
  Administered 2013-05-30: 50 ug via INTRAVENOUS
  Filled 2013-05-30: qty 2

## 2013-05-30 MED ORDER — FUROSEMIDE 10 MG/ML IJ SOLN
80.0000 mg | Freq: Once | INTRAMUSCULAR | Status: AC
  Administered 2013-05-30: 80 mg via INTRAVENOUS
  Filled 2013-05-30: qty 8

## 2013-05-30 MED ORDER — ALBUTEROL SULFATE (2.5 MG/3ML) 0.083% IN NEBU
5.0000 mg | INHALATION_SOLUTION | Freq: Once | RESPIRATORY_TRACT | Status: AC
  Administered 2013-05-30: 5 mg via RESPIRATORY_TRACT
  Filled 2013-05-30: qty 6

## 2013-05-30 NOTE — ED Notes (Signed)
Pt states his sob worsened since yesterday, productive cough, states this feels like his chf

## 2013-05-30 NOTE — ED Provider Notes (Signed)
CSN: [id number]     Arrival date & time 05/30/13  2016 History  This chart was scribed for Larry Greek, MD by Elby Beck, ED Scribe. This patient was seen in room [facility] and the patient's care was started at 8:20 PM.   Chief Complaint  Patient presents with  . Shortness of Breath  . Cough    The history is provided by the patient. No language interpreter was used.    HPI Comments: [name] is a 58 y.o. male with a history of CHF, CAD, HTN and DM who presents to the Emergency Department complaining of SOB, increased beyond his baseline SOB over the past 2 days. He reports an associated cough, swelling in his abdomen and legs and pain with breathing. He reports that he took 1 extra Lasix yesterday and 2 extra Lasix today without relief of symptoms. He states that his symptoms feel like his history of CHF. He is not on supplemental O2 at home.   Past Medical History  Diagnosis Date  . Diabetes mellitus   . Hypertension   . CHF (congestive heart failure)   . CRD (chronic renal disease)   . Arthritis   . Obesity   . Depression   . Sleep apnea   . Diabetic neuropathy   . CAD (coronary artery disease)   . Mitral regurgitation   . Esophagitis, reflux 07/07/10    MW tear, erosive reflux esophagitis, 2cm hh  . S/P colonoscopy April 2012    Rourk: torturous colon, hyperplastic polyps  . Collagen vascular disease     "myoclonic "  . Tremor   . Diverticulum   . Hiatal hernia   . GERD (gastroesophageal reflux disease)     erosive  . Gastroparesis 11/2010    No emptying at two hours.   . Chronic back pain   . Clonic seizure     controlled with klonodine   Past Surgical History  Procedure Laterality Date  . Neck surgery      discectomy  . Hand surgery      right-carpal tunnel  . Colonoscopy  before 2002    [place]  . Appendectomy    . Other surgical history      bone graft-knee  . Anterior lumbar corpectomy w/ fusion      C5-6/C4-7  . Colonoscopy   08/21/2010    elongaed tortuous colon otherwise normal  . Esophagogastroduodenoscopy  07/08/10    Dr. Volney American esophageal erosions and excoriations consistent with erosive reflux esophagitis, hiatal hernia  . Knee arthroscopy      right  . Esophagogastroduodenoscopy (egd) with propofol  02/25/2012    Procedure: ESOPHAGOGASTRODUODENOSCOPY (EGD) WITH PROPOFOL;  Surgeon: Daneil Dolin, MD;  Location: [facility];  Service: Endoscopy;  Laterality: N/A;  7:30 /POLYPHARMACY   Family History  Problem Relation Age of Onset  . Stroke Father 57  . Stroke Mother 60  . Colon cancer Neg Hx   . Crohn's disease Neg Hx   . Cirrhosis Neg Hx   . Ulcerative colitis Neg Hx   . Stomach cancer Neg Hx    History  Substance Use Topics  . Smoking status: Current Every Day Smoker -- 0.50 packs/day for 40 years    Types: Cigarettes  . Smokeless tobacco: Never Used     Comment: ordered nicotine patches. start date is 27 July 2010  . Alcohol Use: No    Review of Systems  Respiratory: Positive for cough and shortness of breath.  Cardiovascular: Positive for leg swelling.  All other systems reviewed and are negative.   Allergies  Ms contin  Home Medications   Current Outpatient Rx  Name  Route  Sig  Dispense  Refill  . baclofen (LIORESAL) 5 mg TABS   Oral   Take 0.5 tablets (5 mg total) by mouth 3 (three) times daily.   90 tablet   3   . clonazePAM (KLONOPIN) 0.5 MG tablet   Oral   Take 0.5-1 mg by mouth 2 (two) times daily. 0.5 mg in the a.m. And 1 mg at night         . dicyclomine (BENTYL) 10 MG capsule   Oral   Take 1 capsule (10 mg total) by mouth 3 (three) times daily as needed (hold for constipation, use sparingly as can slow down stomach emptying).   90 capsule   0   . FLUoxetine (PROZAC) 40 MG capsule   Oral   Take 40 mg by mouth 2 (two) times daily.         . furosemide (LASIX) 20 MG tablet   Oral   Take 40 mg by mouth daily as needed. For fluid         . gabapentin  (NEURONTIN) 400 MG capsule   Oral   Take 400 mg by mouth 3 (three) times daily.         . hydrOXYzine (ATARAX/VISTARIL) 10 MG tablet   Oral   Take 10 mg by mouth 3 (three) times daily as needed. For itching         . insulin aspart protamine-insulin aspart (NOVOLOG 70/30) (70-30) 100 UNIT/ML injection   Subcutaneous   Inject 10-20 Units into the skin 2 (two) times daily with a meal. Units vary based on food consumed.         Marland Kitchen EXPIRED: modafinil (PROVIGIL) 100 MG tablet   Oral   Take 1 tablet (100 mg total) by mouth daily. Please take daily for another 3 days, then increase to twice a day   60 tablet   3   . modafinil (PROVIGIL) 200 MG tablet   Oral   Take 100 mg by mouth 2 (two) times daily.         Marland Kitchen morphine (MSIR) 30 MG tablet   Oral   Take 30 mg by mouth every 4 (four) hours as needed for pain.         . Multiple Vitamins-Minerals (CENTRUM SILVER ULTRA MENS) TABS   Oral   Take 1 tablet by mouth daily.           . ondansetron (ZOFRAN-ODT) 4 MG disintegrating tablet   Oral   Take 1 tablet (4 mg total) by mouth every 4 (four) hours as needed for nausea.   20 tablet   0   . pantoprazole (PROTONIX) 40 MG tablet   Oral   Take 40 mg by mouth daily.         . polyethylene glycol (MIRALAX / GLYCOLAX) packet   Oral   Take 17 g by mouth daily as needed. For constipation         . promethazine (PHENERGAN) 25 MG suppository   Rectal   Place 1 suppository (25 mg total) rectally every 6 (six) hours as needed for nausea.   12 each   0   . promethazine (PHENERGAN) 25 MG tablet   Oral   Take 25 mg by mouth every 6 (six) hours as needed. For nausea         .  rOPINIRole (REQUIP) 2 MG tablet   Oral   Take 4 mg by mouth at bedtime.          . traMADol (ULTRAM) 50 MG tablet   Oral   Take 100 mg by mouth every 6 (six) hours as needed.          Triage Vitals: BP 158/73  Pulse 83  Temp(Src) 98.1 F (36.7 C) (Oral)  Resp 24  Ht 6' (1.829 m)  Wt 209  lb (94.802 kg)  BMI 28.34 kg/m2  SpO2 99%  Physical Exam  Constitutional: He is oriented to person, place, and time. He appears well-developed and well-nourished. No distress.  HENT:  Head: Normocephalic and atraumatic.  Right Ear: Hearing normal.  Left Ear: Hearing normal.  Nose: Nose normal.  Mouth/Throat: Oropharynx is clear and moist and mucous membranes are normal.  Eyes: Conjunctivae and EOM are normal. Pupils are equal, round, and reactive to light.  Neck: Normal range of motion. Neck supple.  Cardiovascular: Regular rhythm, S1 normal and S2 normal.  Exam reveals no gallop and no friction rub.   No murmur heard. Pulmonary/Chest: Effort normal and breath sounds normal. No respiratory distress. He exhibits no tenderness.  Tachypneic. Accessory muscle usage. Increased work of breathing. Appears uncomfortable to breathe. Coughing during exam.  Abdominal: Soft. Normal appearance and bowel sounds are normal. There is no hepatosplenomegaly. There is no tenderness. There is no rebound, no guarding, no tenderness at McBurney's point and negative Murphy's sign. No hernia.  Musculoskeletal: Normal range of motion. He exhibits edema (1+ pitting edema in bilateral lower extremities).  Neurological: He is alert and oriented to person, place, and time. He has normal strength. No cranial nerve deficit or sensory deficit. Coordination normal. GCS eye subscore is 4. GCS verbal subscore is 5. GCS motor subscore is 6.  Skin: Skin is warm, dry and intact. No rash noted. No cyanosis.  Psychiatric: He has a normal mood and affect. His speech is normal and behavior is normal. Thought content normal.    ED Course  Procedures (including critical care time)  DIAGNOSTIC STUDIES: Oxygen Saturation is 99% on RA, normal by my interpretation.    COORDINATION OF CARE: 8:25PM- Discussed plan to obtain a CXR and diagnostic lab work. Also discussed plan to order medications in the ED. Pt advised of plan for  treatment and pt agrees.  Medications  fentaNYL (SUBLIMAZE) injection 50 mcg (50 mcg Intravenous Given 05/30/13 2108)  albuterol (PROVENTIL) (2.5 MG/3ML) 0.083% nebulizer solution 5 mg (5 mg Nebulization Given 05/30/13 2113)  furosemide (LASIX) injection 80 mg (80 mg Intravenous Given 05/30/13 2122)   Labs Review Labs Reviewed  CBC WITH DIFFERENTIAL - Abnormal; Notable for the following:    RBC 4.07 (*)    Hemoglobin 12.0 (*)    HCT 35.7 (*)    All other components within normal limits  COMPREHENSIVE METABOLIC PANEL - Abnormal; Notable for the following:    Glucose, Bld 223 (*)    BUN 29 (*)    Creatinine, Ser 2.09 (*)    GFR calc non Af Amer 33 (*)    GFR calc Af Amer 39 (*)    All other components within normal limits  PRO B NATRIURETIC PEPTIDE - Abnormal; Notable for the following:    Pro B Natriuretic peptide (BNP) 9807.0 (*)    All other components within normal limits  TROPONIN I   Imaging Review Dg Chest 2 View  05/30/2013   CLINICAL DATA:  Cough, shortness of  breath.  EXAM: CHEST  2 VIEW  COMPARISON:  12/08/2012  FINDINGS: Diffuse interstitial prominence throughout the lungs. Mild peribronchial thickening, stable. Heart is normal size. No effusions or acute bony abnormality. Spinal stimulator device noted over the mid thoracic spine.  IMPRESSION: Interstitial prominence throughout the lungs, with peribronchial thickening. Suspect chronic interstitial lung disease/ bronchitis.   Electronically Signed   By: Rolm Baptise M.D.   On: 05/30/2013 20:54    EKG Interpretation    Date/Time:  Tuesday May 30 2013 20:28:12 EST Ventricular Rate:  82 PR Interval:  144 QRS Duration: 118 QT Interval:  426 QTC Calculation: 497 R Axis:   -6 Text Interpretation:  Sinus rhythm with occasional Premature ventricular complexes Possible Left atrial enlargement Septal infarct (cited on or before 08-Dec-2012) Abnormal ECG When compared with ECG of 08-Dec-2012 23:07, Premature ventricular complexes  are now Present Questionable change in initial forces of Anterior leads Confirmed by POLLINA  MD, CHRISTOPHER ([id number]) on 05/30/2013 9:13:23 PM            MDM  Diagnosis.: Congestive heart failure  Patient presents to the ER for evaluation of difficulty breathing. Patient has a history of congestive heart failure. He reports that he had some sensation of increased swelling for 4 days, took an extra Lasix yesterday and 2 extra Lasix today without improvement, patient is in moderate distress with increased respiratory rate. His BNP was markedly elevated over his baseline. Patient did not have any improvement with albuterol, but has slowly improved as he is diuresed a significant amount of fluid after IV Lasix here in the ER. Based on the patient's poor response to increased Lasix as an outpatient as well as his significant shortness of breath arrival, hospitalist has been asked to consult on the patient. He does look significantly better at this time, disposition will be determined in conjunction with the hospitalist.  I personally performed the services described in this documentation, which was scribed in my presence. The recorded information has been reviewed and is accurate.    Larry Greek, MD 05/30/13 [phone]

## 2013-05-31 ENCOUNTER — Encounter (HOSPITAL_COMMUNITY): Payer: Self-pay | Admitting: Internal Medicine

## 2013-05-31 DIAGNOSIS — I10 Essential (primary) hypertension: Secondary | ICD-10-CM

## 2013-05-31 DIAGNOSIS — N183 Chronic kidney disease, stage 3 unspecified: Secondary | ICD-10-CM

## 2013-05-31 DIAGNOSIS — E1142 Type 2 diabetes mellitus with diabetic polyneuropathy: Secondary | ICD-10-CM

## 2013-05-31 DIAGNOSIS — I5021 Acute systolic (congestive) heart failure: Secondary | ICD-10-CM

## 2013-05-31 DIAGNOSIS — N189 Chronic kidney disease, unspecified: Secondary | ICD-10-CM

## 2013-05-31 DIAGNOSIS — N179 Acute kidney failure, unspecified: Secondary | ICD-10-CM | POA: Diagnosis present

## 2013-05-31 DIAGNOSIS — E1149 Type 2 diabetes mellitus with other diabetic neurological complication: Secondary | ICD-10-CM

## 2013-05-31 DIAGNOSIS — G4733 Obstructive sleep apnea (adult) (pediatric): Secondary | ICD-10-CM

## 2013-05-31 LAB — HEMOGLOBIN A1C
Hgb A1c MFr Bld: 6.8 % — ABNORMAL HIGH (ref <5.7)
MEAN PLASMA GLUCOSE: 148 mg/dL — AB (ref <117)

## 2013-05-31 LAB — INFLUENZA PANEL BY PCR (TYPE A & B)
H1N1FLUPCR: NOT DETECTED
INFLAPCR: NEGATIVE
INFLBPCR: NEGATIVE

## 2013-05-31 LAB — GLUCOSE, CAPILLARY
GLUCOSE-CAPILLARY: 199 mg/dL — AB (ref 70–99)
GLUCOSE-CAPILLARY: 168 mg/dL — AB (ref 70–99)
Glucose-Capillary: 143 mg/dL — ABNORMAL HIGH (ref 70–99)
Glucose-Capillary: 170 mg/dL — ABNORMAL HIGH (ref 70–99)

## 2013-05-31 LAB — TROPONIN I
Troponin I: <0.3 ng/mL (ref <0.30)
Troponin I: <0.3 ng/mL (ref <0.30)
Troponin I: <0.3 ng/mL (ref <0.30)

## 2013-05-31 MED ORDER — ONDANSETRON HCL 4 MG/2ML IJ SOLN
4.0000 mg | Freq: Four times a day (QID) | INTRAMUSCULAR | Status: DC | PRN
  Administered 2013-06-01: 4 mg via INTRAVENOUS
  Filled 2013-05-31: qty 2

## 2013-05-31 MED ORDER — FUROSEMIDE 10 MG/ML IJ SOLN
80.0000 mg | Freq: Two times a day (BID) | INTRAMUSCULAR | Status: DC
  Administered 2013-05-31 – 2013-06-01 (×3): 80 mg via INTRAVENOUS
  Filled 2013-05-31 (×3): qty 8

## 2013-05-31 MED ORDER — CLONAZEPAM 0.5 MG PO TABS
0.5000 mg | ORAL_TABLET | Freq: Two times a day (BID) | ORAL | Status: DC
  Administered 2013-05-31: 0.5 mg via ORAL
  Filled 2013-05-31: qty 2

## 2013-05-31 MED ORDER — BACLOFEN 10 MG PO TABS
5.0000 mg | ORAL_TABLET | Freq: Three times a day (TID) | ORAL | Status: DC
  Administered 2013-05-31 – 2013-06-03 (×11): 5 mg via ORAL
  Filled 2013-05-31 (×14): qty 1
  Filled 2013-05-31: qty 0.5
  Filled 2013-05-31 (×2): qty 1

## 2013-05-31 MED ORDER — HYDRALAZINE HCL 10 MG PO TABS
10.0000 mg | ORAL_TABLET | Freq: Three times a day (TID) | ORAL | Status: DC
  Administered 2013-05-31 – 2013-06-04 (×11): 10 mg via ORAL
  Filled 2013-05-31 (×12): qty 1

## 2013-05-31 MED ORDER — NITROGLYCERIN 2 % TD OINT
TOPICAL_OINTMENT | TRANSDERMAL | Status: AC
  Filled 2013-05-31: qty 1

## 2013-05-31 MED ORDER — INSULIN ASPART 100 UNIT/ML ~~LOC~~ SOLN
0.0000 [IU] | Freq: Three times a day (TID) | SUBCUTANEOUS | Status: DC
  Administered 2013-05-31: 3 [IU] via SUBCUTANEOUS
  Administered 2013-05-31: 2 [IU] via SUBCUTANEOUS
  Administered 2013-05-31: 3 [IU] via SUBCUTANEOUS
  Administered 2013-06-01 – 2013-06-02 (×3): 5 [IU] via SUBCUTANEOUS
  Administered 2013-06-02 (×2): 2 [IU] via SUBCUTANEOUS
  Administered 2013-06-03 (×2): 3 [IU] via SUBCUTANEOUS
  Administered 2013-06-03: 5 [IU] via SUBCUTANEOUS
  Administered 2013-06-04 (×2): 3 [IU] via SUBCUTANEOUS

## 2013-05-31 MED ORDER — NITROGLYCERIN 2 % TD OINT
1.0000 [in_us] | TOPICAL_OINTMENT | Freq: Four times a day (QID) | TRANSDERMAL | Status: DC
  Administered 2013-05-31 (×2): 1 [in_us] via TOPICAL
  Filled 2013-05-31: qty 1

## 2013-05-31 MED ORDER — LISINOPRIL 5 MG PO TABS
5.0000 mg | ORAL_TABLET | Freq: Every day | ORAL | Status: DC
  Administered 2013-05-31: 5 mg via ORAL
  Filled 2013-05-31 (×2): qty 1

## 2013-05-31 MED ORDER — MORPHINE SULFATE 15 MG PO TABS
15.0000 mg | ORAL_TABLET | Freq: Four times a day (QID) | ORAL | Status: AC | PRN
  Administered 2013-06-01 (×2): 15 mg via ORAL
  Filled 2013-05-31 (×2): qty 1

## 2013-05-31 MED ORDER — HYDROXYZINE HCL 10 MG PO TABS
10.0000 mg | ORAL_TABLET | Freq: Three times a day (TID) | ORAL | Status: DC | PRN
  Filled 2013-05-31 (×2): qty 1

## 2013-05-31 MED ORDER — MODAFINIL 200 MG PO TABS
100.0000 mg | ORAL_TABLET | Freq: Two times a day (BID) | ORAL | Status: DC
  Administered 2013-05-31 – 2013-06-04 (×9): 100 mg via ORAL
  Filled 2013-05-31 (×9): qty 1

## 2013-05-31 MED ORDER — PANTOPRAZOLE SODIUM 40 MG PO TBEC
DELAYED_RELEASE_TABLET | ORAL | Status: AC
  Administered 2013-05-31: 40 mg via ORAL
  Filled 2013-05-31: qty 1

## 2013-05-31 MED ORDER — FENTANYL 100 MCG/HR TD PT72
100.0000 ug | MEDICATED_PATCH | TRANSDERMAL | Status: DC
  Administered 2013-06-02: 100 ug via TRANSDERMAL
  Filled 2013-05-31: qty 1

## 2013-05-31 MED ORDER — HYDROMORPHONE HCL PF 1 MG/ML IJ SOLN
0.5000 mg | INTRAMUSCULAR | Status: DC | PRN
Start: 2013-05-31 — End: 2013-05-31
  Administered 2013-05-31 (×4): 0.5 mg via INTRAVENOUS
  Filled 2013-05-31 (×4): qty 1

## 2013-05-31 MED ORDER — ASPIRIN EC 81 MG PO TBEC
81.0000 mg | DELAYED_RELEASE_TABLET | Freq: Every day | ORAL | Status: DC
  Administered 2013-05-31 – 2013-06-04 (×5): 81 mg via ORAL
  Filled 2013-05-31 (×7): qty 1

## 2013-05-31 MED ORDER — PANTOPRAZOLE SODIUM 40 MG PO TBEC
40.0000 mg | DELAYED_RELEASE_TABLET | Freq: Every day | ORAL | Status: DC
  Administered 2013-06-01 – 2013-06-04 (×4): 40 mg via ORAL
  Filled 2013-05-31 (×4): qty 1

## 2013-05-31 MED ORDER — HEPARIN SODIUM (PORCINE) 5000 UNIT/ML IJ SOLN
INTRAMUSCULAR | Status: AC
  Administered 2013-05-31: 5000 [IU] via SUBCUTANEOUS
  Filled 2013-05-31: qty 1

## 2013-05-31 MED ORDER — CLONAZEPAM 0.5 MG PO TABS
1.0000 mg | ORAL_TABLET | Freq: Every day | ORAL | Status: DC
  Administered 2013-05-31 – 2013-06-01 (×2): 1 mg via ORAL
  Filled 2013-05-31 (×2): qty 2

## 2013-05-31 MED ORDER — MORPHINE SULFATE 15 MG PO TABS: 15.0000 mg | ORAL_TABLET | Freq: Four times a day (QID) | ORAL | Status: DC | PRN

## 2013-05-31 MED ORDER — HEPARIN SODIUM (PORCINE) 5000 UNIT/ML IJ SOLN
5000.0000 [IU] | Freq: Three times a day (TID) | INTRAMUSCULAR | Status: DC
  Administered 2013-05-31 – 2013-06-04 (×14): 5000 [IU] via SUBCUTANEOUS
  Filled 2013-05-31 (×14): qty 1

## 2013-05-31 MED ORDER — INSULIN ASPART 100 UNIT/ML ~~LOC~~ SOLN: 0.0000 [IU] | Freq: Every day | SUBCUTANEOUS | Status: DC

## 2013-05-31 MED ORDER — FLUOXETINE HCL 20 MG PO CAPS
40.0000 mg | ORAL_CAPSULE | Freq: Every day | ORAL | Status: DC
  Administered 2013-05-31 – 2013-06-04 (×5): 40 mg via ORAL
  Filled 2013-05-31 (×8): qty 2

## 2013-05-31 MED ORDER — ROPINIROLE HCL 1 MG PO TABS
4.0000 mg | ORAL_TABLET | Freq: Every day | ORAL | Status: DC
  Administered 2013-05-31 – 2013-06-03 (×4): 4 mg via ORAL
  Filled 2013-05-31 (×5): qty 4

## 2013-05-31 MED ORDER — SODIUM CHLORIDE 0.9 % IJ SOLN: 3.0000 mL | INTRAMUSCULAR | Status: DC | PRN

## 2013-05-31 MED ORDER — CLONAZEPAM 0.5 MG PO TABS
0.5000 mg | ORAL_TABLET | Freq: Every day | ORAL | Status: DC
  Administered 2013-06-01 – 2013-06-03 (×3): 0.5 mg via ORAL
  Filled 2013-05-31 (×3): qty 1

## 2013-05-31 MED ORDER — CARVEDILOL 12.5 MG PO TABS
6.2500 mg | ORAL_TABLET | Freq: Two times a day (BID) | ORAL | Status: DC
  Administered 2013-05-31 – 2013-06-04 (×9): 6.25 mg via ORAL
  Filled 2013-05-31 (×7): qty 1
  Filled 2013-05-31: qty 0.5
  Filled 2013-05-31 (×4): qty 1

## 2013-05-31 MED ORDER — GABAPENTIN 400 MG PO CAPS
400.0000 mg | ORAL_CAPSULE | Freq: Three times a day (TID) | ORAL | Status: DC
  Administered 2013-05-31 – 2013-06-04 (×14): 400 mg via ORAL
  Filled 2013-05-31 (×6): qty 1
  Filled 2013-05-31: qty 4
  Filled 2013-05-31 (×8): qty 1

## 2013-05-31 MED ORDER — ISOSORBIDE MONONITRATE ER 30 MG PO TB24
15.0000 mg | ORAL_TABLET | Freq: Every day | ORAL | Status: DC
  Administered 2013-05-31 – 2013-06-04 (×5): 15 mg via ORAL
  Filled 2013-05-31 (×5): qty 1

## 2013-05-31 MED ORDER — TRAZODONE HCL 50 MG PO TABS
100.0000 mg | ORAL_TABLET | Freq: Every day | ORAL | Status: DC
  Administered 2013-05-31 – 2013-06-01 (×2): 100 mg via ORAL
  Filled 2013-05-31 (×2): qty 2

## 2013-05-31 MED ORDER — SODIUM CHLORIDE 0.9 % IJ SOLN
3.0000 mL | Freq: Two times a day (BID) | INTRAMUSCULAR | Status: DC
  Administered 2013-05-31 – 2013-06-04 (×6): 3 mL via INTRAVENOUS

## 2013-05-31 MED ORDER — SODIUM CHLORIDE 0.9 % IV SOLN: 250.0000 mL | INTRAVENOUS | Status: DC | PRN

## 2013-05-31 MED ORDER — ACETAMINOPHEN 325 MG PO TABS
650.0000 mg | ORAL_TABLET | ORAL | Status: DC | PRN
  Administered 2013-05-31 – 2013-06-03 (×3): 650 mg via ORAL
  Filled 2013-05-31 (×3): qty 2

## 2013-05-31 MED ORDER — FENTANYL 100 MCG/HR TD PT72: 100.0000 ug | MEDICATED_PATCH | TRANSDERMAL | Status: DC

## 2013-05-31 MED ORDER — DICYCLOMINE HCL 10 MG PO CAPS
10.0000 mg | ORAL_CAPSULE | Freq: Three times a day (TID) | ORAL | Status: DC | PRN
  Administered 2013-06-03 – 2013-06-04 (×2): 10 mg via ORAL
  Filled 2013-05-31 (×2): qty 1

## 2013-05-31 NOTE — ED Notes (Signed)
Patient ate breakfast without difficulty. No distress. Patient respirations even and unlabored. Awaiting inpatient bed.

## 2013-05-31 NOTE — Progress Notes (Signed)
Patient seen, independently examined and chart reviewed. I agree with exam, assessment and plan discussed with Dyanne Carrel, NP.  58 year old man presents to the emergency department with progressive shortness of breath, orthopnea, PND, leg edema, weight gain. He was admitted for acute systolic congestive heart failure, type 2 diabetes mellitus, chronic kidney disease stage III.  PMH Diabetes mellitus, neuropathy, gastroparesis Chronic systolic congestive heart failure, EF 20-25%. Presumed nonischemic cardiomyopathy Sleep apnea Coronary artery disease Chronic back pain chronic kidney disease stage III. Tobacco dependence  Seen by cardiology. Appreciate recommendations, continue Coreg, resume ACE inhibitor, hydralazine, Imdur. Consider outpatient biventricular pacemaker/ICD placement. Overall he is feeling much better today. Breathing better.  Afebrile, vital signs stable. Appears calm and comfortable. Speech fluent and clear. Cardiovascular regular rate and rhythm. Respiratory clear to auscultation bilaterally. No wheezes, rales or rhonchi. Normal respiratory effort. No significant lower extremity edema.  Excellent urine output, -4.2 L since admission. Capillary blood sugars are stable. Hemoglobin A1c 6.8. PCR influenza negative. Baseline creatinine appears to be greater than 2.  A/P: Continue treatment for acute on chronic systolic congestive heart failure, secondary to presumed ischemic cardiomyopathy. Continue diuresis. Repeat laboratory studies in the morning, suspect kidney function is at baseline. Resume 70/30 insulin. He would like to establish with nephrology in the area.  Murray Hodgkins, MD [facility] [phone]

## 2013-05-31 NOTE — Consult Note (Signed)
CARDIOLOGY CONSULT NOTE  Patient ID: [name] MRN: [id number] DOB/AGE: 59/19/1957 59 y.o.  Admit date: 05/30/2013 Primary Physician Annia Belt, MD  Reason for Consultation: heart failure  HPI: The patient is a 58 yr old male with a h/o nonischemic cardiomyopathy, HTN, diabetes mellitus, CKD, sleep apnea, and GERD. He most recently saw his cardiologist on 05/24/13, and this note documents the patient underwent a "normal cath" at [facility], although I am unable to locate these results (pt thinks it was done "a few years ago"). The most recent echocardiogram I find in the system is dated 04/10/2012 which revealed severely reduced LV systolic function, EF 29-92%, with mild LV enlargement, mild biatrial enlargement, and mild mitral regurgitation. He also reportedly underwent a Lexiscan nuclear perfusion study prior to coronary angiography on 06/10/2012 which showed the following: 1. Inducible ischemic changes in the inferior wall and and to a lesser extent the apex with stress. 2. Enlarged left ventricle. The left ventricular ejection fraction = 37%.  He normally takes Lasix 40 mg every morning and an extra 40 mg in the evening if he has increasing leg swelling, although the office record notes 40/80mg  on alternate days (as per 05/24/2013 cardiology office visit), with an extra 20 mg for increased leg swelling, along with Imdur 15 mg daily, hydralazine 12.5 mg tid, carvedilol 6.25 mg bid, and lisinopril 5 mg daily. He says he used to be on 50 mg bid of carvedilol.  He has been having increasing dyspnea with exertion and leg swelling over the last 3 days. He normally sleeps with 1 pillow but has had orthopnea to the point of using 7 pillows, along with PND. He denies chest pain per se, but had a "chest pressure". He denies palpitations and syncope. He had been taking 120 mg Lasix daily for the past 3 days without relief. He and his wife adhere to a low-sodium diet, and rinses all canned  foods. He had a cold last week and a fever of 101. His wife had the flu.   Since hospital admission, he has been receiving IV Lasix and the patient says "I feel much better".       Allergies  Allergen Reactions  . Ms Contin [Morphine Sulfate Er]     Doesn't like to take in liquid form( burns and doesn't help) (pill form works fine)     Current Facility-Administered Medications  Medication Dose Route Frequency Provider Last Rate Last Dose  . 0.9 %  sodium chloride infusion  250 mL Intravenous PRN Bonnielee Haff, MD      . acetaminophen (TYLENOL) tablet 650 mg  650 mg Oral Q4H PRN Bonnielee Haff, MD      . aspirin EC tablet 81 mg  81 mg Oral Daily Bonnielee Haff, MD   81 mg at 05/31/13 1013  . baclofen (LIORESAL) tablet 5 mg  5 mg Oral TID Bonnielee Haff, MD   5 mg at 05/31/13 1521  . carvedilol (COREG) tablet 6.25 mg  6.25 mg Oral BID WC Bonnielee Haff, MD   6.25 mg at 05/31/13 1010  . [START ON 06/01/2013] clonazePAM (KLONOPIN) tablet 0.5 mg  0.5 mg Oral Daily Bonnielee Haff, MD       And  . clonazePAM (KLONOPIN) tablet 1 mg  1 mg Oral QHS Bonnielee Haff, MD      . dicyclomine (BENTYL) capsule 10 mg  10 mg Oral TID PRN Bonnielee Haff, MD      . Derrill Memo ON  06/02/2013] fentaNYL (DURAGESIC - dosed mcg/hr) 100 mcg  100 mcg Transdermal Q72H Samuella Cota, MD      . FLUoxetine (PROZAC) capsule 40 mg  40 mg Oral Daily Bonnielee Haff, MD   40 mg at 05/31/13 1013  . furosemide (LASIX) injection 80 mg  80 mg Intravenous Q12H Bonnielee Haff, MD   80 mg at 05/31/13 0734  . gabapentin (NEURONTIN) capsule 400 mg  400 mg Oral TID Bonnielee Haff, MD   400 mg at 05/31/13 1521  . heparin injection 5,000 Units  5,000 Units Subcutaneous Q8H Bonnielee Haff, MD   5,000 Units at 05/31/13 1521  . HYDROmorphone (DILAUDID) injection 0.5 mg  0.5 mg Intravenous Q3H PRN Bonnielee Haff, MD   0.5 mg at 05/31/13 1225  . hydrOXYzine (ATARAX/VISTARIL) tablet 10 mg  10 mg Oral TID PRN Bonnielee Haff, MD      . insulin  aspart (novoLOG) injection 0-15 Units  0-15 Units Subcutaneous TID WC Bonnielee Haff, MD   3 Units at 05/31/13 1225  . insulin aspart (novoLOG) injection 0-5 Units  0-5 Units Subcutaneous QHS Bonnielee Haff, MD      . modafinil (PROVIGIL) tablet 100 mg  100 mg Oral BID Bonnielee Haff, MD   100 mg at 05/31/13 1038  . nitroGLYCERIN (NITROGLYN) 2 % ointment 1 inch  1 inch Topical Q6H Bonnielee Haff, MD   1 inch at 05/31/13 1226  . ondansetron (ZOFRAN) injection 4 mg  4 mg Intravenous Q6H PRN Bonnielee Haff, MD      . pantoprazole (PROTONIX) EC tablet 40 mg  40 mg Oral Daily Bonnielee Haff, MD   40 mg at 05/31/13 1011  . rOPINIRole (REQUIP) tablet 4 mg  4 mg Oral QHS Bonnielee Haff, MD      . sodium chloride 0.9 % injection 3 mL  3 mL Intravenous Q12H Bonnielee Haff, MD      . sodium chloride 0.9 % injection 3 mL  3 mL Intravenous PRN Bonnielee Haff, MD        Past Medical History  Diagnosis Date  . Diabetes mellitus   . Hypertension   . CHF (congestive heart failure)   . CRD (chronic renal disease)   . Arthritis   . Obesity   . Depression   . Sleep apnea   . Diabetic neuropathy   . CAD (coronary artery disease)   . Mitral regurgitation   . Esophagitis, reflux 07/07/10    MW tear, erosive reflux esophagitis, 2cm hh  . S/P colonoscopy April 2012    Rourk: torturous colon, hyperplastic polyps  . Collagen vascular disease     "myoclonic "  . Tremor   . Diverticulum   . Hiatal hernia   . GERD (gastroesophageal reflux disease)     erosive  . Gastroparesis 11/2010    No emptying at two hours.   . Chronic back pain   . Clonic seizure     controlled with klonodine    Past Surgical History  Procedure Laterality Date  . Neck surgery      discectomy  . Hand surgery      right-carpal tunnel  . Colonoscopy  before 2002    [place]  . Appendectomy    . Other surgical history      bone graft-knee  . Anterior lumbar corpectomy w/ fusion      C5-6/C4-7  . Colonoscopy  08/21/2010     elongaed tortuous colon otherwise normal  . Esophagogastroduodenoscopy  07/08/10    Dr.  Rourk-distal esophageal erosions and excoriations consistent with erosive reflux esophagitis, hiatal hernia  . Knee arthroscopy      right  . Esophagogastroduodenoscopy (egd) with propofol  02/25/2012    Procedure: ESOPHAGOGASTRODUODENOSCOPY (EGD) WITH PROPOFOL;  Surgeon: Daneil Dolin, MD;  Location: [facility];  Service: Endoscopy;  Laterality: N/A;  7:30 /POLYPHARMACY    History   Social History  . Marital Status: Married    Spouse Name: N/A    Number of Children: 1  . Years of Education: N/A   Occupational History  . disabled    Social History Main Topics  . Smoking status: Current Every Day Smoker -- 0.50 packs/day for 40 years    Types: Cigarettes  . Smokeless tobacco: Never Used     Comment: ordered nicotine patches. start date is 27 July 2010  . Alcohol Use: No  . Drug Use: Yes    Special: Marijuana     Comment: few times year  . Sexual Activity: Not on file     Comment: erectile dysfunction   Other Topics Concern  . Not on file   Social History Narrative   Lost one dgt due to homicide.     Family History  Problem Relation Age of Onset  . Stroke Father 76  . Stroke Mother 50  . Colon cancer Neg Hx   . Crohn's disease Neg Hx   . Cirrhosis Neg Hx   . Ulcerative colitis Neg Hx   . Stomach cancer Neg Hx      Prior to Admission medications   Medication Sig Start Date End Date Taking? Authorizing Provider  baclofen (LIORESAL) 5 mg TABS Take 0.5 tablets (5 mg total) by mouth 3 (three) times daily. 12/24/11  Yes Ripudeep Krystal Eaton, MD  clonazePAM (KLONOPIN) 0.5 MG tablet Take 0.5-1 mg by mouth 2 (two) times daily as needed for anxiety (1 in the morning and 2 at bedtime).   Yes Historical Provider, MD  fentaNYL (DURAGESIC - DOSED MCG/HR) 100 MCG/HR Place 100 mcg onto the skin every 3 (three) days.   Yes Historical Provider, MD  FLUoxetine (PROZAC) 40 MG capsule Take 40 mg by mouth 2  (two) times daily.   Yes Historical Provider, MD  furosemide (LASIX) 40 MG tablet Take 40 mg by mouth 2 (two) times daily. 1 in the morning and 1 at bedtime if needed   Yes Historical Provider, MD  gabapentin (NEURONTIN) 400 MG capsule Take 400 mg by mouth 3 (three) times daily.   Yes Historical Provider, MD  insulin aspart protamine-insulin aspart (NOVOLOG 70/30) (70-30) 100 UNIT/ML injection Inject 10-20 Units into the skin 2 (two) times daily with a meal. Units vary based on food consumed.   Yes Historical Provider, MD  modafinil (PROVIGIL) 200 MG tablet Take 100 mg by mouth 2 (two) times daily.   Yes Historical Provider, MD  morphine (MSIR) 30 MG tablet Take 30 mg by mouth every 4 (four) hours as needed for pain.   Yes Historical Provider, MD  Multiple Vitamins-Minerals (CENTRUM SILVER ULTRA MENS) TABS Take 1 tablet by mouth daily.     Yes Historical Provider, MD  ondansetron (ZOFRAN-ODT) 4 MG disintegrating tablet Take 1 tablet (4 mg total) by mouth every 4 (four) hours as needed for nausea. 01/27/12  Yes Mahala Menghini, PA-C  pantoprazole (PROTONIX) 40 MG tablet Take 40 mg by mouth daily.   Yes Historical Provider, MD  polyethylene glycol (MIRALAX / GLYCOLAX) packet Take 17 g by mouth daily as needed.  For constipation   Yes Historical Provider, MD  promethazine (PHENERGAN) 25 MG tablet Take 25 mg by mouth every 6 (six) hours as needed. For nausea   Yes Historical Provider, MD  rOPINIRole (REQUIP) 3 MG tablet Take 6 mg by mouth at bedtime.   Yes Historical Provider, MD  traMADol (ULTRAM) 50 MG tablet Take 100 mg by mouth every 6 (six) hours as needed.   Yes Historical Provider, MD     Review of systems complete and found to be negative unless listed above in HPI     Physical exam Blood pressure 134/79, pulse 62, temperature 98.4 F (36.9 C), temperature source Oral, resp. rate 18, height 6' (1.829 m), weight 207 lb 3.7 oz (94 kg), SpO2 98.00%. General: NAD Neck: +JVD, no thyromegaly or  thyroid nodule.  Lungs:Faint inspiratory crackles bilaterally at the bases with normal respiratory effort. CV: Nondisplaced PMI.  Heart regular S1/S2, no S3/S4, no murmur.  Trace peri-ankle edema with skin wrinkling of bilateral pretibial regions.  No carotid bruit.  Normal pedal pulses.  Abdomen: Soft, nontender, no hepatosplenomegaly, no distention.  Skin: Intact without lesions or rashes.  Neurologic: Alert and oriented x 3.  Psych: Normal affect. Extremities: No clubbing or cyanosis.  HEENT: Normal.   Labs:   Lab Results  Component Value Date   WBC 9.0 05/30/2013   HGB 12.0* 05/30/2013   HCT 35.7* 05/30/2013   MCV 87.7 05/30/2013   PLT 200 05/30/2013    Recent Labs Lab 05/30/13 2025  NA 140  K 4.3  CL 98  CO2 28  BUN 29*  CREATININE 2.09*  CALCIUM 8.9  PROT 7.1  BILITOT 0.4  ALKPHOS 111  ALT 17  AST 19  GLUCOSE 223*   Lab Results  Component Value Date   CKTOTAL 186 02/16/2011   CKMB 2.2 02/16/2011   TROPONINI <0.30 05/31/2013    Lab Results  Component Value Date   CHOL  Value: 193        ATP III CLASSIFICATION:  <200     mg/dL   Desirable  200-239  mg/dL   Borderline High  >=240    mg/dL   High        05/30/2009   CHOL 169 06/04/2008   CHOL 126 08/31/2007   Lab Results  Component Value Date   HDL 23* 05/30/2009   HDL 32* 06/04/2008   HDL 26* 08/31/2007   Lab Results  Component Value Date   LDLCALC  Value: 121        Total Cholesterol/HDL:CHD Risk Coronary Heart Disease Risk Table                     Men   Women  1/2 Average Risk   3.4   3.3  Average Risk       5.0   4.4  2 X Average Risk   9.6   7.1  3 X Average Risk  23.4   11.0        Use the calculated Patient Ratio above and the CHD Risk Table to determine the patient's CHD Risk.        ATP III CLASSIFICATION (LDL):  <100     mg/dL   Optimal  100-129  mg/dL   Near or Above                    Optimal  130-159  mg/dL   Borderline  160-189  mg/dL   High  >190  mg/dL   Very High* 05/30/2009   LDLCALC 67 06/04/2008   LDLCALC 23  08/31/2007   Lab Results  Component Value Date   TRIG 245* 05/30/2009   TRIG 352* 06/04/2008   TRIG 385* 08/31/2007   Lab Results  Component Value Date   CHOLHDL 8.4 05/30/2009   CHOLHDL 5.3 Ratio 06/04/2008   CHOLHDL 4.8 Ratio 08/31/2007   No results found for this basename: LDLDIRECT       EKG: Sinus rhythm, nonspecific IVCD (QRS duration 118 ms), PVC, old septal infarct, poor R wave progression   Studies: Dg Chest 2 View  05/30/2013   CLINICAL DATA:  Cough, shortness of breath.  EXAM: CHEST  2 VIEW  COMPARISON:  12/08/2012  FINDINGS: Diffuse interstitial prominence throughout the lungs. Mild peribronchial thickening, stable. Heart is normal size. No effusions or acute bony abnormality. Spinal stimulator device noted over the mid thoracic spine.  IMPRESSION: Interstitial prominence throughout the lungs, with peribronchial thickening. Suspect chronic interstitial lung disease/ bronchitis.   Electronically Signed   By: Rolm Baptise M.D.   On: 05/30/2013 20:54    ASSESSMENT AND PLAN:  1. Acute systolic heart failure/nonischemic cardiomyopathy: EF 20-25% on 04/10/2012, and presumably due to a nonischemic cardiomyopathy. Now -4.2 liters on IV Lasix 80 mg bid, which I would continue given his marked symptomatic improvement. Continue Coreg 6.25 mg bid. GFR 33 ml/min on 05/30/13, awaiting today's BMET. His CKD is at his baseline, and would thus recommend restarting ACEI as it will help to improve LV hemodynamics. I will restart outpatient hydralazine and Imdur regimen, and d/c nitro paste. His QRS duration is 118 ms and given his severely reduced EF, he would likely benefit from a biventricular pacemaker/ICD as it would provide more efficient cardiac synchrony, as well as for prophylaxis against malignant ventricular arrhythmias, respectively. It appears he adheres to a low-sodium diet. He will likely need to be on scheduled 40 mg Lasix bid at the time of discharge, with the addition of prn metolazone for  increasing dyspnea/leg swelling. Troponins negative, ruling out an acute coronary syndrome. 2. HTN: controlled on present therapy. 3. CKD: awaiting today's BMET. On 2/3, GFR was 33 ml/min. 4. IDDM: management as per primary team. 5. Sleep apnea: continue CPAP.   Signed: Kate Sable, M.D., F.A.C.C.  05/31/2013, 3:34 PM

## 2013-05-31 NOTE — Progress Notes (Signed)
[facility] PROGRESS NOTE  Larry SANSOUCIE OIZ:[id number] DOB: 1955/12/14 DOA: 05/30/2013 PCP: Annia Belt, MD  Assessment/Plan: #1 acute systolic congestive heart failure: improved on  Lasix intravenously. Volume status -3.8L. Holding  ACE inhibitor for now due to his renal insufficiency. Continue Carvedilol. Await echo. Continue NTG paste. Chart indicates hx CHF but unable to find echo. Reports cardiologist at [facility]. Troponin negative x2. EKG with SR with PVC and possible left enlargement. Chest xray with Interstitial prominence throughout the lungs, with peribronchial thickening. Suspect chronic interstitial lung disease/ bronchitis.  Will request cards consult.    #2 Possible recent influenza: Influenza PCR negative  #3 type 2 diabetes: A1c in process.  Continue with sliding scale insulin coverage.  #4 history of chronic kidney disease, stage III: creatinine appears to be close to baseline. Urine output good. Continue to monitor closely given IV lasix  #5 history of obstructive sleep apnea: Continue with CPAP  #6 chronic back pain: Continue with fentanyl patch.    Code Status: full Family Communication: none present Disposition Plan: home when ready   Consultants:  cardiology  Procedures:  none  Antibiotics:  none  HPI/Subjective: Sitting up in bed. Reports breathing much easier  Objective: Filed Vitals:   05/31/13 1210  BP: 135/70  Pulse: 113  Temp: 97.9 F (36.6 C)  Resp: 20    Intake/Output Summary (Last 24 hours) at 05/31/13 1411 Last data filed at 05/31/13 1038  Gross per 24 hour  Intake    740 ml  Output   4575 ml  Net  -3835 ml   Filed Weights   05/30/13 2026 05/31/13 1144  Weight: 94.802 kg (209 lb) 94 kg (207 lb 3.7 oz)    Exam:   General:  Well nourished NAD  Cardiovascular: RRR no m/g/r. 1-2+pitting edema to just above ankles.   Respiratory: normal effort BS with fine crackles bilateral base. No wheeze no rhonchi  Abdomen: obese  soft +BS non-tender to palpation  Musculoskeletal: good muscle tone   Data Reviewed: Basic Metabolic Panel:  Recent Labs Lab 05/30/13 2025  NA 140  K 4.3  CL 98  CO2 28  GLUCOSE 223*  BUN 29*  CREATININE 2.09*  CALCIUM 8.9   Liver Function Tests:  Recent Labs Lab 05/30/13 2025  AST 19  ALT 17  ALKPHOS 111  BILITOT 0.4  PROT 7.1  ALBUMIN 3.6   No results found for this basename: LIPASE, AMYLASE,  in the last 168 hours No results found for this basename: AMMONIA,  in the last 168 hours CBC:  Recent Labs Lab 05/30/13 2025  WBC 9.0  NEUTROABS 6.6  HGB 12.0*  HCT 35.7*  MCV 87.7  PLT 200   Cardiac Enzymes:  Recent Labs Lab 05/30/13 2025 05/31/13 0300 05/31/13 0837  TROPONINI <0.30 <0.30 <0.30   BNP (last 3 results)  Recent Labs  05/30/13 2025  PROBNP 9807.0*   CBG:  Recent Labs Lab 05/31/13 0722  GLUCAP 143*    No results found for this or any previous visit (from the past 240 hour(s)).   Studies: Dg Chest 2 View  05/30/2013   CLINICAL DATA:  Cough, shortness of breath.  EXAM: CHEST  2 VIEW  COMPARISON:  12/08/2012  FINDINGS: Diffuse interstitial prominence throughout the lungs. Mild peribronchial thickening, stable. Heart is normal size. No effusions or acute bony abnormality. Spinal stimulator device noted over the mid thoracic spine.  IMPRESSION: Interstitial prominence throughout the lungs, with peribronchial thickening. Suspect chronic interstitial lung disease/ bronchitis.  Electronically Signed   By: Rolm Baptise M.D.   On: 05/30/2013 20:54    Scheduled Meds: . aspirin EC  81 mg Oral Daily  . baclofen  5 mg Oral TID  . carvedilol  6.25 mg Oral BID WC  . [START ON 06/01/2013] clonazePAM  0.5 mg Oral Daily   And  . clonazePAM  1 mg Oral QHS  . [START ON 06/02/2013] fentaNYL  100 mcg Transdermal Q72H  . FLUoxetine  40 mg Oral Daily  . furosemide  80 mg Intravenous Q12H  . gabapentin  400 mg Oral TID  . heparin  5,000 Units Subcutaneous  Q8H  . insulin aspart  0-15 Units Subcutaneous TID WC  . insulin aspart  0-5 Units Subcutaneous QHS  . modafinil  100 mg Oral BID  . nitroGLYCERIN  1 inch Topical Q6H  . pantoprazole  40 mg Oral Daily  . rOPINIRole  4 mg Oral QHS  . sodium chloride  3 mL Intravenous Q12H   Continuous Infusions:   Principal Problem:   Acute systolic HF (heart failure) Active Problems:   CKD (chronic kidney disease), stage III   OSA on CPAP   DM type 2 with diabetic peripheral neuropathy   Anemia   Acute systolic heart failure   Acute on chronic renal failure    Time spent: 30 minutes    [facility] Pager [phone]. If 7PM-7AM, please contact night-coverage at [email or web address], password [facility] 05/31/2013, 2:11 PM  LOS: 1 day

## 2013-05-31 NOTE — H&P (Addendum)
[facility] History and Physical  [name] EUM:[id number] DOB: 04-Jun-1955 DOA: 05/30/2013   PCP: Annia Belt, MD  Specialists: Followed by Cardiology at [facility]  Chief Complaint: Shortness of breath for the last 3 days  HPI: [name] is a 58 y.o. male with a past medical history of congestive heart failure, diabetes on insulin, chronic back pain, who was in his usual state of health till about 3 days ago, when he started developing shortness of breath, which has been progressively getting worse. He has been unable to climb the stairs at the house. He's had worsening orthopnea and had symptoms suggestive of PND. He has been utilizing multiple pillows. He started noticing worsening leg swelling about a week ago. He is very sure that he has gained weight, but is unable to tell me exactly how much. He's had left-sided chest fullness for which he is taking multiple doses of nitroglycerin in the last few days. The pain is continuous 6/10 in intensity. No radiation. No precipitating or aggravating factors. He denies a cough. He had a fever of 101F last week. His wife had the flu. He did get a flu shot this season. He admits to poor oral intake in the last few days. Has had nausea without any vomiting. Has had some loose stools. He has been compliant with his medications. He actually took higher doses of his Lasix when he started getting short of breath. However, it did not provide any relief. He finally decided to come in to the emergency department.  Home Medications: Current List is not available at this time. There is an old list in our system. Pharmacy tech to update the list in the morning. He does tell me that he has been recently started on a fentanyl patch. I verified this by the presence of a patch on his right arm. His been started on Dilaudid for breakthrough pain for his chronic back pain. He's also on carvedilol. Continues to be on his psychotropic medications  and Neurontin.  Allergies:  Allergies  Allergen Reactions  . Ms Contin [Morphine Sulfate Er]     Past Medical History: Past Medical History  Diagnosis Date  . Diabetes mellitus   . Hypertension   . CHF (congestive heart failure)   . CRD (chronic renal disease)   . Arthritis   . Obesity   . Depression   . Sleep apnea   . Diabetic neuropathy   . CAD (coronary artery disease)   . Mitral regurgitation   . Esophagitis, reflux 07/07/10    MW tear, erosive reflux esophagitis, 2cm hh  . S/P colonoscopy April 2012    Rourk: torturous colon, hyperplastic polyps  . Collagen vascular disease     "myoclonic "  . Tremor   . Diverticulum   . Hiatal hernia   . GERD (gastroesophageal reflux disease)     erosive  . Gastroparesis 11/2010    No emptying at two hours.   . Chronic back pain   . Clonic seizure     controlled with klonodine    Past Surgical History  Procedure Laterality Date  . Neck surgery      discectomy  . Hand surgery      right-carpal tunnel  . Colonoscopy  before 2002    [place]  . Appendectomy    . Other surgical history      bone graft-knee  . Anterior lumbar corpectomy w/ fusion      C5-6/C4-7  . Colonoscopy  08/21/2010    elongaed tortuous colon otherwise normal  . Esophagogastroduodenoscopy  07/08/10    Dr. Volney American esophageal erosions and excoriations consistent with erosive reflux esophagitis, hiatal hernia  . Knee arthroscopy      right  . Esophagogastroduodenoscopy (egd) with propofol  02/25/2012    Procedure: ESOPHAGOGASTRODUODENOSCOPY (EGD) WITH PROPOFOL;  Surgeon: Daneil Dolin, MD;  Location: [facility];  Service: Endoscopy;  Laterality: N/A;  7:30 /POLYPHARMACY    Social History: Patient lives with his wife. Smokes half pack of cigarettes on a daily basis. No alcohol use. No illicit drug use. He is trying to spit smoking. Usually independent with daily activities.  Family History:  Family History  Problem Relation Age of Onset  . Stroke  Father 65  . Stroke Mother 51  . Colon cancer Neg Hx   . Crohn's disease Neg Hx   . Cirrhosis Neg Hx   . Ulcerative colitis Neg Hx   . Stomach cancer Neg Hx      Review of Systems - History obtained from the patient General ROS: positive for  - fatigue Psychological ROS: negative Ophthalmic ROS: negative ENT ROS: negative Allergy and Immunology ROS: negative Hematological and Lymphatic ROS: negative Endocrine ROS: negative Respiratory ROS: as in hpi Cardiovascular ROS: as in hpi Gastrointestinal ROS: as in hpi Genito-Urinary ROS: no dysuria, trouble voiding, or hematuria Musculoskeletal ROS: negative Neurological ROS: no TIA or stroke symptoms Dermatological ROS: negative  Physical Examination  Filed Vitals:   05/30/13 2026 05/30/13 2115 05/30/13 2325  BP: 158/73  148/62  Pulse: 83  80  Temp: 98.1 F (36.7 C)    TempSrc: Oral    Resp: 24  18  Height: 6' (1.829 m)    Weight: 94.802 kg (209 lb)    SpO2: 99% 94% 95%    General appearance: alert, cooperative, appears stated age and no distress Head: Normocephalic, without obvious abnormality, atraumatic Eyes: conjunctivae/corneas clear. PERRL, EOM's intact.  Throat: lips, mucosa, and tongue normal; teeth and gums normal Neck: no adenopathy, no carotid bruit, supple, symmetrical, trachea midline and thyroid not enlarged, symmetric, no tenderness/mass/nodules Resp: Crackles bilateral bases. No wheezing Cardio: S1-S2 is normal. Regular. S3, S4. No rubs, murmurs, or bruit. JVD is present. 2+ pitting edema is present in the lower extremities. GI: soft, non-tender; bowel sounds normal; no masses,  no organomegaly Extremities: edema 2+ Pulses: 2+ and symmetric Skin: Skin color, texture, turgor normal. No rashes or lesions Lymph nodes: Cervical, supraclavicular, and axillary nodes normal. Neurologic: Alert and oriented x3. No focal neurological deficits are present  Laboratory Data: Results for orders placed during the  hospital encounter of 05/30/13 (from the past 48 hour(s))  CBC WITH DIFFERENTIAL     Status: Abnormal   Collection Time    05/30/13  8:25 PM      Result Value Range   WBC 9.0  4.0 - 10.5 K/uL   RBC 4.07 (*) 4.22 - 5.81 MIL/uL   Hemoglobin 12.0 (*) 13.0 - 17.0 g/dL   HCT 35.7 (*) 39.0 - 52.0 %   MCV 87.7  78.0 - 100.0 fL   MCH 29.5  26.0 - 34.0 pg   MCHC 33.6  30.0 - 36.0 g/dL   RDW 15.3  11.5 - 15.5 %   Platelets 200  150 - 400 K/uL   Neutrophils Relative % 75  43 - 77 %   Neutro Abs 6.6  1.7 - 7.7 K/uL   Lymphocytes Relative 17  12 - 46 %  Lymphs Abs 1.6  0.7 - 4.0 K/uL   Monocytes Relative 7  3 - 12 %   Monocytes Absolute 0.7  0.1 - 1.0 K/uL   Eosinophils Relative 1  0 - 5 %   Eosinophils Absolute 0.1  0.0 - 0.7 K/uL   Basophils Relative 0  0 - 1 %   Basophils Absolute 0.0  0.0 - 0.1 K/uL  COMPREHENSIVE METABOLIC PANEL     Status: Abnormal   Collection Time    05/30/13  8:25 PM      Result Value Range   Sodium 140  137 - 147 mEq/L   Potassium 4.3  3.7 - 5.3 mEq/L   Chloride 98  96 - 112 mEq/L   CO2 28  19 - 32 mEq/L   Glucose, Bld 223 (*) 70 - 99 mg/dL   BUN 29 (*) 6 - 23 mg/dL   Creatinine, Ser 2.09 (*) 0.50 - 1.35 mg/dL   Calcium 8.9  8.4 - 10.5 mg/dL   Total Protein 7.1  6.0 - 8.3 g/dL   Albumin 3.6  3.5 - 5.2 g/dL   AST 19  0 - 37 U/L   ALT 17  0 - 53 U/L   Alkaline Phosphatase 111  39 - 117 U/L   Total Bilirubin 0.4  0.3 - 1.2 mg/dL   GFR calc non Af Amer 33 (*) >90 mL/min   GFR calc Af Amer 39 (*) >90 mL/min   Comment: (NOTE)     The eGFR has been calculated using the CKD EPI equation.     This calculation has not been validated in all clinical situations.     eGFR's persistently <90 mL/min signify possible Chronic Kidney     Disease.  PRO B NATRIURETIC PEPTIDE     Status: Abnormal   Collection Time    05/30/13  8:25 PM      Result Value Range   Pro B Natriuretic peptide (BNP) 9807.0 (*) 0 - 125 pg/mL  TROPONIN I     Status: None   Collection Time     05/30/13  8:25 PM      Result Value Range   Troponin I <0.30  <0.30 ng/mL   Comment:            Due to the release kinetics of cTnI,     a negative result within the first hours     of the onset of symptoms does not rule out     myocardial infarction with certainty.     If myocardial infarction is still suspected,     repeat the test at appropriate intervals.    Radiology Reports: Dg Chest 2 View  05/30/2013   CLINICAL DATA:  Cough, shortness of breath.  EXAM: CHEST  2 VIEW  COMPARISON:  12/08/2012  FINDINGS: Diffuse interstitial prominence throughout the lungs. Mild peribronchial thickening, stable. Heart is normal size. No effusions or acute bony abnormality. Spinal stimulator device noted over the mid thoracic spine.  IMPRESSION: Interstitial prominence throughout the lungs, with peribronchial thickening. Suspect chronic interstitial lung disease/ bronchitis.   Electronically Signed   By: Rolm Baptise M.D.   On: 05/30/2013 20:54    Electrocardiogram: EKG shows sinus rhythm at 82 beats per minute. Normal axis. Q waves in V1, V2, V3. No concerning ST changes. Similar to old EKG  Problem List  Principal Problem:   Acute systolic HF (heart failure) Active Problems:   CKD (chronic kidney disease), stage III   OSA on  CPAP   DM type 2 with diabetic peripheral neuropathy   Acute systolic heart failure   Assessment: This is a 58 year old, Caucasian male, with a past medical history of CHF presents with worsening shortness of breath for the last 3 days. He appears to have acute congestive heart failure. Quite possible he may have had the flu last week, which may have exacerbated his CHF. His EF based on online records from [place] appears to be somewhere between 30-35%. No recent echo report is available to [place] in our system  Plan: #1 acute systolic congestive heart failure: Lasix intravenously. Strict ins and outs. Hold ACE inhibitor for now due to his renal insufficiency.  Carvedilol will be continued. Echocardiogram will be obtained. Nitroglycerin ointment will be provided.  #2 Possible recent influenza: Will check influenza PCR.  #3 type 2 diabetes: Continue with sliding scale insulin coverage  #4 history of chronic kidney disease, stage III: [name] appears to be close to baseline. Continue to monitor.  #5 history of obstructive sleep apnea: Continue with CPAP  #6 chronic back pain: Continue with fentanyl patch.  Home medication list is not verified.  DVT Prophylaxis: Heparin Code Status: Full code Family Communication: Discussed with patient and his wife  Disposition Plan: Admit to telemetry   Further management decisions will depend on results of further testing and patient's response to treatment.  [facility]  [facility] Pager [phone]  If 7PM-7AM, please contact night-coverage [email or web address] Password [email or web address]  05/31/2013, 1:08 AM

## 2013-06-01 DIAGNOSIS — N189 Chronic kidney disease, unspecified: Secondary | ICD-10-CM

## 2013-06-01 LAB — GLUCOSE, CAPILLARY
GLUCOSE-CAPILLARY: 123 mg/dL — AB (ref 70–99)
Glucose-Capillary: 208 mg/dL — ABNORMAL HIGH (ref 70–99)
Glucose-Capillary: 71 mg/dL (ref 70–99)
Glucose-Capillary: 243 mg/dL — ABNORMAL HIGH (ref 70–99)

## 2013-06-01 LAB — BASIC METABOLIC PANEL
BUN: 32 mg/dL — AB (ref 6–23)
CALCIUM: 8.8 mg/dL (ref 8.4–10.5)
CO2: 30 mEq/L (ref 19–32)
Chloride: 100 mEq/L (ref 96–112)
Creatinine, Ser: 2.39 mg/dL — ABNORMAL HIGH (ref 0.50–1.35)
GFR calc Af Amer: 33 mL/min — ABNORMAL LOW (ref >90)
GFR, EST NON AFRICAN AMERICAN: 28 mL/min — AB (ref >90)
Glucose, Bld: 228 mg/dL — ABNORMAL HIGH (ref 70–99)
Potassium: 3.5 mEq/L — ABNORMAL LOW (ref 3.7–5.3)
Sodium: 143 mEq/L (ref 137–147)

## 2013-06-01 MED ORDER — POTASSIUM CHLORIDE CRYS ER 20 MEQ PO TBCR
20.0000 meq | EXTENDED_RELEASE_TABLET | Freq: Every day | ORAL | Status: DC
Start: 2013-06-01 — End: 2013-06-04
  Administered 2013-06-01 – 2013-06-04 (×4): 20 meq via ORAL
  Filled 2013-06-01 (×4): qty 1

## 2013-06-01 MED ORDER — FUROSEMIDE 10 MG/ML IJ SOLN
40.0000 mg | Freq: Two times a day (BID) | INTRAMUSCULAR | Status: DC
  Administered 2013-06-01 – 2013-06-02 (×2): 40 mg via INTRAVENOUS
  Filled 2013-06-01 (×2): qty 4

## 2013-06-01 MED ORDER — POTASSIUM CHLORIDE CRYS ER 20 MEQ PO TBCR
40.0000 meq | EXTENDED_RELEASE_TABLET | Freq: Once | ORAL | Status: AC
  Administered 2013-06-01: 40 meq via ORAL
  Filled 2013-06-01: qty 2

## 2013-06-01 MED ORDER — LISINOPRIL 5 MG PO TABS
2.5000 mg | ORAL_TABLET | Freq: Every day | ORAL | Status: DC
  Administered 2013-06-01: 2.5 mg via ORAL

## 2013-06-01 MED ORDER — LISINOPRIL 5 MG PO TABS
5.0000 mg | ORAL_TABLET | Freq: Every day | ORAL | Status: DC
  Administered 2013-06-02 – 2013-06-04 (×3): 5 mg via ORAL
  Filled 2013-06-01 (×3): qty 1

## 2013-06-01 MED ORDER — POLYETHYLENE GLYCOL 3350 17 G PO PACK
17.0000 g | PACK | Freq: Every day | ORAL | Status: DC
  Administered 2013-06-01 – 2013-06-03 (×2): 17 g via ORAL
  Filled 2013-06-01 (×2): qty 1

## 2013-06-01 MED ORDER — MORPHINE SULFATE 15 MG PO TABS
30.0000 mg | ORAL_TABLET | ORAL | Status: DC | PRN
  Administered 2013-06-01 – 2013-06-02 (×3): 30 mg via ORAL
  Filled 2013-06-01 (×3): qty 2

## 2013-06-01 MED ORDER — ROPINIROLE HCL 1 MG PO TABS
ORAL_TABLET | ORAL | Status: AC
  Filled 2013-06-01: qty 4

## 2013-06-01 NOTE — Progress Notes (Signed)
Patient seen, independently examined and chart reviewed. I agree with exam, assessment and plan discussed with Dyanne Carrel, NP.  Overall he feels better today. Breathing better. No new issues.  Afebrile, vital signs stable. He appears calm and comfortable. Speech fluent and clear. Cardiovascular regular rate and rhythm. Respiratory clear to auscultation bilaterally.  Excellent urine output, -6.8 L thus far. Capillary blood sugars overall relatively stable. Although creatinine has elevated somewhat, this is likely very close to his baseline based on previous data. Troponins negative. Hemoglobin A1c 6.8.  He appears to be improving rapidly. Adjust diuretics per cardiology. Continue cardiac medications as per cardiology. Follow up echocardiogram in the morning. Would anticipate discharge in the next 24 hours.   Murray Hodgkins, MD [facility] [phone]

## 2013-06-01 NOTE — Progress Notes (Signed)
Utilization Review Complete  

## 2013-06-01 NOTE — Progress Notes (Signed)
Inpatient Diabetes Program Recommendations  AACE/ADA: New Consensus Statement on Inpatient Glycemic Control (2013)  Target Ranges:  Prepandial:   less than 140 mg/dL      Peak postprandial:   less than 180 mg/dL (1-2 hours)      Critically ill patients:  140 - 180 mg/dL   Results for ZAKARY, KIMURA (MRN [id number]) as of 06/01/2013 11:26  Ref. Range 05/31/2013 07:22 05/31/2013 11:41 05/31/2013 16:45 05/31/2013 21:54 06/01/2013 07:45  Glucose-Capillary Latest Range: 70-99 mg/dL 143 (H) 199 (H) 168 (H) 170 (H) 208 (H)    Diabetes history: DM2 Outpatient Diabetes medications: 70/30 10-20 units BID  Current orders for Inpatient glycemic control: Novolog 0-15 units AC and Novolog 0-5 units HS   Inpatient Diabetes Program Recommendations Insulin - Basal: If CBGs remain greater than 180 mg/dl consistently, may want to consider ordering low dose basal insulin.  Thanks, Barnie Alderman, RN, MSN, CCRN Diabetes Coordinator Inpatient Diabetes Program [phone] (Team Pager) [phone] ([facility] office) [phone] [facility] office)

## 2013-06-01 NOTE — Progress Notes (Signed)
The patient was seen and examined, and I agree with the assessment and plan as documented above, with modifications as noted below. Please my consult H&P for summarization of all recent cardiology office visits and invasive and noninvasive testing. He is doing much better with relief of shortness of breath and leg swelling. Denies chest pain and palpitations.  Assessment & Plan: 1. Acute systolic heart failure/nonischemic cardiomyopathy: EF 20-25% on 04/10/2012, and presumably due to a nonischemic cardiomyopathy. Agree with reducing IV Lasix to 40 mg bid given creatinine elevation and symptomatic improvement. Continue Coreg 6.25 mg bid. GFR 28 ml/min. I would continue ACEI, hydralazine and Imdur. His QRS duration is 118 ms and given his severely reduced EF, he would likely benefit from a biventricular pacemaker/ICD as it would provide more efficient cardiac synchrony, as well as for prophylaxis against malignant ventricular arrhythmias, respectively. Again, he will likely need to be on scheduled 40 mg Lasix bid at the time of discharge, with the addition of prn metolazone for increasing dyspnea/leg swelling. Troponins negative, ruling out an acute coronary syndrome. Now on KCl replacement, K 3.5 today (aim > 4). 2. HTN: controlled on present therapy. Again, I would recommend continuing ACEI, Coreg, hydralazine, and Imdur at present doses. 3. CKD:GFR 28 ml/min. Will decrease IV Lasix to 40 mg bid.  4. IDDM: management as per primary team.  5. Sleep apnea: continue CPAP.

## 2013-06-01 NOTE — Progress Notes (Addendum)
Discussed CPAP use with patient. He stated that he took the CPAP unit off early last night because he was unable to rest with it on.  Stated that he knows how to turn on and put mask on if he decides to wear tonight, but more than likely will not be wearing.  Patient to call RRT if he needs assistance. Hospital CPAP unit still at bedside at this time.

## 2013-06-01 NOTE — Progress Notes (Signed)
Consulting cardiologist: Bronson Ing  Subjective:    Breathing better, didn't sleep well. No pain.  Objective:   Temp:  [97.9 F (36.6 C)-98.4 F (36.9 C)] 97.9 F (36.6 C) (02/05 0552) Pulse Rate:  [58-113] 58 (02/05 0552) Resp:  [12-20] 16 (02/05 0552) BP: (110-135)/(49-79) 114/59 mmHg (02/05 0552) SpO2:  [91 %-99 %] 96 % (02/05 0552) Weight:  [207 lb 3.7 oz (94 kg)] 207 lb 3.7 oz (94 kg) (02/04 1144) Last BM Date: 05/30/13  Filed Weights   05/30/13 2026 05/31/13 1144  Weight: 209 lb (94.802 kg) 207 lb 3.7 oz (94 kg)    Intake/Output Summary (Last 24 hours) at 06/01/13 0829 Last data filed at 05/31/13 2200  Gross per 24 hour  Intake    956 ml  Output   3375 ml  Net  -2419 ml    Telemetry: NSR 60's.   Exam:  General: No acute distress.  HEENT: Conjunctiva and lids normal, oropharynx clear.  Lungs: Bibasilar crackles. No wheezes.   Cardiac: No elevated JVP or bruits. RRR, no gallop or rub.   Abdomen: Normoactive bowel sounds, nontender, nondistended.  Extremities: No pitting edema, distal pulses full.  Neuropsychiatric: Alert and oriented x3, affect appropriate.   Lab Results:  Basic Metabolic Panel:  Recent Labs Lab 05/30/13 2025 06/01/13 0545  NA 140 143  K 4.3 3.5*  CL 98 100  CO2 28 30  GLUCOSE 223* 228*  BUN 29* 32*  CREATININE 2.09* 2.39*  CALCIUM 8.9 8.8    Liver Function Tests:  Recent Labs Lab 05/30/13 2025  AST 19  ALT 17  ALKPHOS 111  BILITOT 0.4  PROT 7.1  ALBUMIN 3.6    CBC:  Recent Labs Lab 05/30/13 2025  WBC 9.0  HGB 12.0*  HCT 35.7*  MCV 87.7  PLT 200    Cardiac Enzymes:  Recent Labs Lab 05/31/13 0300 05/31/13 0837 05/31/13 1435  TROPONINI <0.30 <0.30 <0.30    BNP:  Recent Labs  05/30/13 2025  PROBNP 9807.0*    Radiology: Dg Chest 2 View  05/30/2013   CLINICAL DATA:  Cough, shortness of breath.  EXAM: CHEST  2 VIEW  COMPARISON:  12/08/2012  FINDINGS: Diffuse interstitial prominence  throughout the lungs. Mild peribronchial thickening, stable. Heart is normal size. No effusions or acute bony abnormality. Spinal stimulator device noted over the mid thoracic spine.  IMPRESSION: Interstitial prominence throughout the lungs, with peribronchial thickening. Suspect chronic interstitial lung disease/ bronchitis.   Electronically Signed   By: Rolm Baptise M.D.   On: 05/30/2013 20:54    Medications:   Scheduled Medications: . aspirin EC  81 mg Oral Daily  . baclofen  5 mg Oral TID  . carvedilol  6.25 mg Oral BID WC  . clonazePAM  0.5 mg Oral Daily   And  . clonazePAM  1 mg Oral QHS  . [START ON 06/02/2013] fentaNYL  100 mcg Transdermal Q72H  . FLUoxetine  40 mg Oral Daily  . furosemide  80 mg Intravenous Q12H  . gabapentin  400 mg Oral TID  . heparin  5,000 Units Subcutaneous Q8H  . hydrALAZINE  10 mg Oral Q8H  . insulin aspart  0-15 Units Subcutaneous TID WC  . insulin aspart  0-5 Units Subcutaneous QHS  . isosorbide mononitrate  15 mg Oral Daily  . lisinopril  5 mg Oral Daily  . modafinil  100 mg Oral BID  . pantoprazole  40 mg Oral Daily  . rOPINIRole  4 mg  Oral QHS  . sodium chloride  3 mL Intravenous Q12H  . traZODone  100 mg Oral QHS     PRN Medications:  sodium chloride, acetaminophen, dicyclomine, hydrOXYzine, morphine, ondansetron (ZOFRAN) IV, sodium chloride   Assessment and Plan:   1. Acute on Chronic Systolic CHF in the setting of NICM: EF of 20-25% [facility] by a Dr. Elisabeth Cara, cardiologist. The patient has given me the number [phone] to contact the [facility]. I will request records for prior evaluation, testing  and treatment.  He is feeling and breathing better today. No LEE is noted. He has diuresed 2,900cc and 2,179 cc respectively ib Lasix 80 mg IV, BID.  Mildly hypokalemic at 3.5 which will be repleated. Creatinine 2.39 up from 2.09 on admission.  Will plan to decrease lasix today to 40 mg IV BID as he is better  compensated. Continued on carvedilol and ASA.   [facility] for ongoing close observation and treatment strategies, to include BIV pacer/ICD.   2. Hypertension: BP well controlled now and is low normal in the setting of reduced EF. Continued on hydralazine and nitrates. , but consider decreasing or holding ACE in the setting of increased creatinine with CKD. Suspect cardio-renal syndrome. Will repeat echo for better evaluation and medical management.   3. IDDM: Continued treatment per medicine team.   Phill Myron. Purcell Nails NP [facility] 06/01/2013, 8:29 AM

## 2013-06-01 NOTE — Progress Notes (Signed)
[facility] PROGRESS NOTE  CALUB TARNOW VEL:[id number] DOB: [birth year] DOA: 05/30/2013 PCP: Annia Belt, MD  Assessment/Plan: #1 acute on chronic systolic congestive heart failure: continues to improve. Lasix IV decrease today per cards. Volume status -5L. ACE inhibitor resumed per cards. Continue Carvedilol. EF noted to be 20-25% 12/13.  Await repeat echo. Sees Dr. Elisabeth Cara cardiology in [place]. Troponin negative x2. EKG with SR with PVC and possible left enlargement. apprecieat cardiology assistance.  #2 Possible recent influenza: Influenza PCR negative  #3 type 2 diabetes: A1c 6.8. CBG range 170-208. Resume 70/30. Continue with sliding scale insulin coverage.  #4 history of chronic kidney disease, stage III: creatinine creeping up but remains close to baseline. Urine output good. Continue to monitor closely given IV lasix  #5 history of obstructive sleep apnea: Continue with CPAP  #6 chronic back pain: Continue with fentanyl patch.  #7. Hypokalemia: mild. Related to diuresis as well as CKD. Will replete and recheck   Code Status: full Family Communication: none present Disposition Plan: home hopefully tomorrow   Consultants:  cardiology  Procedures:  none  Antibiotics:  none  HPI/Subjective: Watching tv. Reports feeling better. NAD  Objective: Filed Vitals:   06/01/13 0552  BP: 114/59  Pulse: 58  Temp: 97.9 F (36.6 C)  Resp: 16    Intake/Output Summary (Last 24 hours) at 06/01/13 1119 Last data filed at 05/31/13 2200  Gross per 24 hour  Intake    956 ml  Output   2200 ml  Net  -1244 ml   Filed Weights   05/30/13 2026 05/31/13 1144  Weight: 94.802 kg (209 lb) 94 kg (207 lb 3.7 oz)    Exam:   General:  Well nourished NAD  Cardiovascular: RRR No MGR trace LE edema  Respiratory: normal effort BS with very fine crackles in bases particularly left. No rhonchi no wheeze  Abdomen: soft +BS non-tender to palpation  Musculoskeletal: no  clubbing or cyanosis   Data Reviewed: Basic Metabolic Panel:  Recent Labs Lab 05/30/13 2025 06/01/13 0545  NA 140 143  K 4.3 3.5*  CL 98 100  CO2 28 30  GLUCOSE 223* 228*  BUN 29* 32*  CREATININE 2.09* 2.39*  CALCIUM 8.9 8.8   Liver Function Tests:  Recent Labs Lab 05/30/13 2025  AST 19  ALT 17  ALKPHOS 111  BILITOT 0.4  PROT 7.1  ALBUMIN 3.6   No results found for this basename: LIPASE, AMYLASE,  in the last 168 hours No results found for this basename: AMMONIA,  in the last 168 hours CBC:  Recent Labs Lab 05/30/13 2025  WBC 9.0  NEUTROABS 6.6  HGB 12.0*  HCT 35.7*  MCV 87.7  PLT 200   Cardiac Enzymes:  Recent Labs Lab 05/30/13 2025 05/31/13 0300 05/31/13 0837 05/31/13 1435  TROPONINI <0.30 <0.30 <0.30 <0.30   BNP (last 3 results)  Recent Labs  05/30/13 2025  PROBNP 9807.0*   CBG:  Recent Labs Lab 05/31/13 0722 05/31/13 1141 05/31/13 1645 05/31/13 2154 06/01/13 0745  GLUCAP 143* 199* 168* 170* 208*    No results found for this or any previous visit (from the past 240 hour(s)).   Studies: Dg Chest 2 View  05/30/2013   CLINICAL DATA:  Cough, shortness of breath.  EXAM: CHEST  2 VIEW  COMPARISON:  12/08/2012  FINDINGS: Diffuse interstitial prominence throughout the lungs. Mild peribronchial thickening, stable. Heart is normal size. No effusions or acute bony abnormality. Spinal stimulator device noted over the mid  thoracic spine.  IMPRESSION: Interstitial prominence throughout the lungs, with peribronchial thickening. Suspect chronic interstitial lung disease/ bronchitis.   Electronically Signed   By: Rolm Baptise M.D.   On: 05/30/2013 20:54    Scheduled Meds: . aspirin EC  81 mg Oral Daily  . baclofen  5 mg Oral TID  . carvedilol  6.25 mg Oral BID WC  . clonazePAM  0.5 mg Oral Daily   And  . clonazePAM  1 mg Oral QHS  . [START ON 06/02/2013] fentaNYL  100 mcg Transdermal Q72H  . FLUoxetine  40 mg Oral Daily  . furosemide  40 mg  Intravenous Q12H  . gabapentin  400 mg Oral TID  . heparin  5,000 Units Subcutaneous Q8H  . hydrALAZINE  10 mg Oral Q8H  . insulin aspart  0-15 Units Subcutaneous TID WC  . insulin aspart  0-5 Units Subcutaneous QHS  . isosorbide mononitrate  15 mg Oral Daily  . [START ON 06/02/2013] lisinopril  5 mg Oral Daily  . modafinil  100 mg Oral BID  . pantoprazole  40 mg Oral Daily  . potassium chloride  20 mEq Oral Daily  . rOPINIRole  4 mg Oral QHS  . sodium chloride  3 mL Intravenous Q12H  . traZODone  100 mg Oral QHS   Continuous Infusions:   Principal Problem:   Acute systolic HF (heart failure) Active Problems:   CKD (chronic kidney disease), stage III   OSA on CPAP   DM type 2 with diabetic peripheral neuropathy   Anemia   Acute systolic heart failure   Acute on chronic renal failure    Time spent: 35 minutes    [facility] Pager [phone]. If 7PM-7AM, please contact night-coverage at [email or web address], password [facility] 06/01/2013, 11:19 AM  LOS: 2 days

## 2013-06-02 DIAGNOSIS — E1142 Type 2 diabetes mellitus with diabetic polyneuropathy: Secondary | ICD-10-CM

## 2013-06-02 DIAGNOSIS — N189 Chronic kidney disease, unspecified: Secondary | ICD-10-CM

## 2013-06-02 DIAGNOSIS — I5021 Acute systolic (congestive) heart failure: Secondary | ICD-10-CM

## 2013-06-02 DIAGNOSIS — E1149 Type 2 diabetes mellitus with other diabetic neurological complication: Secondary | ICD-10-CM

## 2013-06-02 LAB — GLUCOSE, CAPILLARY
Glucose-Capillary: 216 mg/dL — ABNORMAL HIGH (ref 70–99)
Glucose-Capillary: 143 mg/dL — ABNORMAL HIGH (ref 70–99)
Glucose-Capillary: 123 mg/dL — ABNORMAL HIGH (ref 70–99)

## 2013-06-02 LAB — BASIC METABOLIC PANEL
BUN: 41 mg/dL — ABNORMAL HIGH (ref 6–23)
CALCIUM: 8.8 mg/dL (ref 8.4–10.5)
CHLORIDE: 98 meq/L (ref 96–112)
CO2: 30 meq/L (ref 19–32)
Creatinine, Ser: 2.62 mg/dL — ABNORMAL HIGH (ref 0.50–1.35)
GFR calc Af Amer: 29 mL/min — ABNORMAL LOW (ref >90)
GFR calc non Af Amer: 25 mL/min — ABNORMAL LOW (ref >90)
GLUCOSE: 197 mg/dL — AB (ref 70–99)
Potassium: 3.9 mEq/L (ref 3.7–5.3)
Sodium: 141 mEq/L (ref 137–147)

## 2013-06-02 MED ORDER — FUROSEMIDE 10 MG/ML IJ SOLN
40.0000 mg | Freq: Once | INTRAMUSCULAR | Status: AC
  Administered 2013-06-02: 40 mg via INTRAVENOUS
  Filled 2013-06-02: qty 4

## 2013-06-02 MED ORDER — MORPHINE SULFATE 15 MG PO TABS
15.0000 mg | ORAL_TABLET | ORAL | Status: DC | PRN
  Administered 2013-06-03 (×2): 15 mg via ORAL
  Filled 2013-06-02 (×2): qty 1

## 2013-06-02 MED ORDER — FUROSEMIDE 40 MG PO TABS
40.0000 mg | ORAL_TABLET | Freq: Two times a day (BID) | ORAL | Status: DC
  Administered 2013-06-03 – 2013-06-04 (×3): 40 mg via ORAL
  Filled 2013-06-02 (×3): qty 1

## 2013-06-02 NOTE — Progress Notes (Signed)
Patient seems to be a little loopy and forgetful. He answered all my assessment questions properly and he is wearing his nasal cannula oxygen and his o2 is fine. However, he has been falling asleep while talking on the phone, seemed to be falling asleep while in the bathroom standing using the urinal and at one point had his o2 mask on pretending to be receiving air but it wasn't hooked up. His wife called me concerned that he has been trying to reach her but cant remember there home number and has been dialing another number they had 5 years ago. She stated she was informed he should be discharged home tomorrow but refuses to let him come home without some kind of neuro consult. William Hamburger RN

## 2013-06-02 NOTE — Progress Notes (Signed)
06/02/2013 2:48 PM Went into patient's room to give meds and found him to be lethargic. He would rouse when touched but, would fall immediately back to sleep.  Patient had not eaten anything from lunch tray. Patient appeared  Apneic so he put Cpap on.  Reported to patient's nurse Jenny Reichmann RN  whom evaluated him.  Dyanne Carrel NP, evaluated patient and advised Hydralazine not to be given due to 102/62 BP.  Patient became more alert and ate some pears.  Prior to leaving the room, insured that patient was alert, call bell in place, and patient able to use call bell if needed.

## 2013-06-02 NOTE — Progress Notes (Signed)
[facility] PROGRESS NOTE  [name] NWG:[id number] DOB: [birth year] DOA: 05/30/2013 PCP: Annia Belt, MD  Assessment/Plan: #1 acute on chronic systolic congestive heart failure/ nonischemic cardiomyopathy: EF 20-25% 12/13. Continues to improve. Lasix IV transitioned to po today per cards. Volume status -6.5L. ACE inhibitor resumed per cards. Also added metolazone for increasing dyspnea/leg swelling.  Continue Carvedilol.  Sees Dr. Elisabeth Cara cardiology in [place]. Troponin negative x2. EKG with SR with PVC and possible left enlargement. apprecieat cardiology assistance.  #2 AMS/report of episode of apnea. Pt sleeping and has hx of sleep apnea. On exam somewhat lethargic but oxygen saturation level 100%. Will decrease MS IR to 15mg  vs 30. Will discontinue trazadone as well. Pt ambulating in room with steady gait. Observe overnight. #3 type 2 diabetes: A1c 6.8. CBG range 143-218. Continue with sliding scale insulin coverage.  #4 history of chronic kidney disease, stage III: creatinine creeping up but remains close to baseline. Urine output good. Anticipate some improvement now that IV lasix discontinued.   #5 history of obstructive sleep apnea: Continue with CPAP  #6 chronic back pain: Continue with fentanyl patch. Reduce MS IR  #7. Hypokalemia: resolved.  Related to diuresis as well as CKD.   Code Status: full Family Communication: wife on phone Disposition Plan: home likely tomorrow.    Consultants:  cardiology  Procedures:  none  Antibiotics:  none  HPI/Subjective: Sitting up in bed. Somewhat lethargic but easily aroused. Denies pain/discomfort   Objective: Filed Vitals:   06/02/13 1456  BP: 103/63  Pulse: 67  Temp: 98 F (36.7 C)  Resp: 18    Intake/Output Summary (Last 24 hours) at 06/02/13 1529 Last data filed at 06/02/13 1457  Gross per 24 hour  Intake    680 ml  Output   2050 ml  Net  -1370 ml   Filed Weights   05/30/13 2026 05/31/13 1144  06/01/13 1229  Weight: 94.802 kg (209 lb) 94 kg (207 lb 3.7 oz) 93.2 kg (205 lb 7.5 oz)    Exam:   General:  Well nourished NAD  Cardiovascular: RRR No m/g/r trace LE edema  Respiratory: normal effort BS clear bilaterally no crackles or wheezes  Abdomen: obese soft +BS non-tender to palpation  Musculoskeletal: no clubbing or cyanosis   Data Reviewed: Basic Metabolic Panel:  Recent Labs Lab 05/30/13 2025 06/01/13 0545 06/02/13 0559  NA 140 143 141  K 4.3 3.5* 3.9  CL 98 100 98  CO2 28 30 30   GLUCOSE 223* 228* 197*  BUN 29* 32* 41*  CREATININE 2.09* 2.39* 2.62*  CALCIUM 8.9 8.8 8.8   Liver Function Tests:  Recent Labs Lab 05/30/13 2025  AST 19  ALT 17  ALKPHOS 111  BILITOT 0.4  PROT 7.1  ALBUMIN 3.6   No results found for this basename: LIPASE, AMYLASE,  in the last 168 hours No results found for this basename: AMMONIA,  in the last 168 hours CBC:  Recent Labs Lab 05/30/13 2025  WBC 9.0  NEUTROABS 6.6  HGB 12.0*  HCT 35.7*  MCV 87.7  PLT 200   Cardiac Enzymes:  Recent Labs Lab 05/30/13 2025 05/31/13 0300 05/31/13 0837 05/31/13 1435  TROPONINI <0.30 <0.30 <0.30 <0.30   BNP (last 3 results)  Recent Labs  05/30/13 2025  PROBNP 9807.0*   CBG:  Recent Labs Lab 06/01/13 1141 06/01/13 1640 06/01/13 2101 06/02/13 0818 06/02/13 1200  GLUCAP 71 243* 123* 216* 143*    No results found for this or any  previous visit (from the past 240 hour(s)).   Studies: No results found.  Scheduled Meds: . aspirin EC  81 mg Oral Daily  . baclofen  5 mg Oral TID  . carvedilol  6.25 mg Oral BID WC  . clonazePAM  0.5 mg Oral Daily   And  . clonazePAM  1 mg Oral QHS  . fentaNYL  100 mcg Transdermal Q72H  . FLUoxetine  40 mg Oral Daily  . furosemide  40 mg Intravenous Once  . furosemide  40 mg Oral BID  . gabapentin  400 mg Oral TID  . heparin  5,000 Units Subcutaneous Q8H  . hydrALAZINE  10 mg Oral Q8H  . insulin aspart  0-15 Units  Subcutaneous TID WC  . insulin aspart  0-5 Units Subcutaneous QHS  . isosorbide mononitrate  15 mg Oral Daily  . lisinopril  5 mg Oral Daily  . modafinil  100 mg Oral BID  . pantoprazole  40 mg Oral Daily  . polyethylene glycol  17 g Oral Daily  . potassium chloride  20 mEq Oral Daily  . rOPINIRole  4 mg Oral QHS  . sodium chloride  3 mL Intravenous Q12H  . traZODone  100 mg Oral QHS   Continuous Infusions:   Principal Problem:   Acute systolic HF (heart failure) Active Problems:   CKD (chronic kidney disease), stage III   OSA on CPAP   DM type 2 with diabetic peripheral neuropathy   Anemia   Acute systolic heart failure   Acute on chronic renal failure    Time spent: 35 minutes    [facility] Pager [phone]. If 7PM-7AM, please contact night-coverage at [email or web address], password [facility] 06/02/2013, 3:29 PM  LOS: 3 days

## 2013-06-02 NOTE — Progress Notes (Signed)
SUBJECTIVE: Pt was reportedly confused earlier this morning as per nursing note. He now complains of some mild shortness of breath which began this morning, with a sensation of chest tightness, similar to what he experienced when first admitted, but much lesser in severity. Denies lightheadedness and dizziness.     Intake/Output Summary (Last 24 hours) at 06/02/13 1138 Last data filed at 06/02/13 4132  Gross per 24 hour  Intake    720 ml  Output   2200 ml  Net  -1480 ml    Current Facility-Administered Medications  Medication Dose Route Frequency Provider Last Rate Last Dose  . 0.9 %  sodium chloride infusion  250 mL Intravenous PRN Bonnielee Haff, MD      . acetaminophen (TYLENOL) tablet 650 mg  650 mg Oral Q4H PRN Bonnielee Haff, MD   650 mg at 06/01/13 4401  . aspirin EC tablet 81 mg  81 mg Oral Daily Bonnielee Haff, MD   81 mg at 06/02/13 0916  . baclofen (LIORESAL) tablet 5 mg  5 mg Oral TID Bonnielee Haff, MD   5 mg at 06/02/13 0826  . carvedilol (COREG) tablet 6.25 mg  6.25 mg Oral BID WC Bonnielee Haff, MD   6.25 mg at 06/02/13 0826  . clonazePAM (KLONOPIN) tablet 0.5 mg  0.5 mg Oral Daily Bonnielee Haff, MD   0.5 mg at 06/02/13 0272   And  . clonazePAM (KLONOPIN) tablet 1 mg  1 mg Oral QHS Bonnielee Haff, MD   1 mg at 06/01/13 2122  . dicyclomine (BENTYL) capsule 10 mg  10 mg Oral TID PRN Bonnielee Haff, MD      . fentaNYL (Jack - dosed mcg/hr) 100 mcg  100 mcg Transdermal Q72H Samuella Cota, MD   100 mcg at 06/02/13 5366  . FLUoxetine (PROZAC) capsule 40 mg  40 mg Oral Daily Bonnielee Haff, MD   40 mg at 06/02/13 4403  . furosemide (LASIX) injection 40 mg  40 mg Intravenous Once Herminio Commons, MD      . furosemide (LASIX) tablet 40 mg  40 mg Oral BID Herminio Commons, MD      . gabapentin (NEURONTIN) capsule 400 mg  400 mg Oral TID Bonnielee Haff, MD   400 mg at 06/02/13 0915  . heparin injection 5,000 Units  5,000 Units Subcutaneous Q8H Bonnielee Haff, MD   5,000 Units at 06/02/13 0607  . hydrALAZINE (APRESOLINE) tablet 10 mg  10 mg Oral Q8H Herminio Commons, MD   10 mg at 06/02/13 0608  . hydrOXYzine (ATARAX/VISTARIL) tablet 10 mg  10 mg Oral TID PRN Bonnielee Haff, MD      . insulin aspart (novoLOG) injection 0-15 Units  0-15 Units Subcutaneous TID WC Bonnielee Haff, MD   5 Units at 06/02/13 0827  . insulin aspart (novoLOG) injection 0-5 Units  0-5 Units Subcutaneous QHS Bonnielee Haff, MD      . isosorbide mononitrate (IMDUR) 24 hr tablet 15 mg  15 mg Oral Daily Herminio Commons, MD   15 mg at 06/02/13 0917  . lisinopril (PRINIVIL,ZESTRIL) tablet 5 mg  5 mg Oral Daily Herminio Commons, MD   5 mg at 06/02/13 4742  . modafinil (PROVIGIL) tablet 100 mg  100 mg Oral BID Bonnielee Haff, MD   100 mg at 06/02/13 0921  . morphine (MSIR) tablet 30 mg  30 mg Oral Q4H PRN Radene Gunning, NP   30 mg at 06/02/13 0843  .  ondansetron (ZOFRAN) injection 4 mg  4 mg Intravenous Q6H PRN Osvaldo Shipper, MD   4 mg at 06/01/13 0002  . pantoprazole (PROTONIX) EC tablet 40 mg  40 mg Oral Daily Osvaldo Shipper, MD   40 mg at 06/02/13 0921  . polyethylene glycol (MIRALAX / GLYCOLAX) packet 17 g  17 g Oral Daily Standley Brooking, MD   17 g at 06/01/13 2136  . potassium chloride SA (K-DUR,KLOR-CON) CR tablet 20 mEq  20 mEq Oral Daily Laqueta Linden, MD   20 mEq at 06/02/13 8875  . rOPINIRole (REQUIP) tablet 4 mg  4 mg Oral QHS Osvaldo Shipper, MD   4 mg at 06/01/13 2158  . sodium chloride 0.9 % injection 3 mL  3 mL Intravenous Q12H Osvaldo Shipper, MD   3 mL at 06/01/13 2200  . sodium chloride 0.9 % injection 3 mL  3 mL Intravenous PRN Osvaldo Shipper, MD      . traZODone (DESYREL) tablet 100 mg  100 mg Oral QHS Standley Brooking, MD   100 mg at 06/01/13 2124    Filed Vitals:   06/01/13 1229 06/01/13 1448 06/01/13 2122 06/02/13 0527  BP:  109/71 123/74 113/63  Pulse:  63  62  Temp:  98.1 F (36.7 C)  98.2 F (36.8 C)  TempSrc:    Oral  Resp:   18  20  Height:      Weight: 205 lb 7.5 oz (93.2 kg)     SpO2:  98%  98%    PHYSICAL EXAM General: NAD Neck: No JVD, no thyromegaly or thyroid nodule.  Lungs: Clear to auscultation bilaterally with normal respiratory effort. CV: Nondisplaced PMI.  Regular rhythm, normal S1/S2, no S3/S4, no murmur.  No pretibial edema.  No carotid bruit.  Normal pedal pulses.  Abdomen: Soft, nontender, no hepatosplenomegaly, no distention.  Neurologic: Alert and oriented x 3.  Psych: Normal affect. Extremities: No clubbing or cyanosis.   TELEMETRY: Reviewed telemetry pt in NSR with PAC's and PVC's.  LABS: Basic Metabolic Panel:  Recent Labs  79/72/82 0545 06/02/13 0559  NA 143 141  K 3.5* 3.9  CL 100 98  CO2 30 30  GLUCOSE 228* 197*  BUN 32* 41*  CREATININE 2.39* 2.62*  CALCIUM 8.8 8.8   Liver Function Tests:  Recent Labs  05/30/13 2025  AST 19  ALT 17  ALKPHOS 111  BILITOT 0.4  PROT 7.1  ALBUMIN 3.6   No results found for this basename: LIPASE, AMYLASE,  in the last 72 hours CBC:  Recent Labs  05/30/13 2025  WBC 9.0  NEUTROABS 6.6  HGB 12.0*  HCT 35.7*  MCV 87.7  PLT 200   Cardiac Enzymes:  Recent Labs  05/31/13 0300 05/31/13 0837 05/31/13 1435  TROPONINI <0.30 <0.30 <0.30   BNP: No components found with this basename: POCBNP,  D-Dimer: No results found for this basename: DDIMER,  in the last 72 hours Hemoglobin A1C:  Recent Labs  05/31/13 0302  HGBA1C 6.8*   Fasting Lipid Panel: No results found for this basename: CHOL, HDL, LDLCALC, TRIG, CHOLHDL, LDLDIRECT,  in the last 72 hours Thyroid Function Tests: No results found for this basename: TSH, T4TOTAL, FREET3, T3FREE, THYROIDAB,  in the last 72 hours Anemia Panel: No results found for this basename: VITAMINB12, FOLATE, FERRITIN, TIBC, IRON, RETICCTPCT,  in the last 72 hours  RADIOLOGY: Dg Chest 2 View  05/30/2013   CLINICAL DATA:  Cough, shortness of breath.  EXAM: CHEST  2 VIEW  COMPARISON:   12/08/2012  FINDINGS: Diffuse interstitial prominence throughout the lungs. Mild peribronchial thickening, stable. Heart is normal size. No effusions or acute bony abnormality. Spinal stimulator device noted over the mid thoracic spine.  IMPRESSION: Interstitial prominence throughout the lungs, with peribronchial thickening. Suspect chronic interstitial lung disease/ bronchitis.   Electronically Signed   By: Rolm Baptise M.D.   On: 05/30/2013 20:54      ASSESSMENT AND PLAN: 1. Acute systolic heart failure/nonischemic cardiomyopathy: EF 20-25% on 04/10/2012, and presumably due to a nonischemic cardiomyopathy. Given increasing shortness of breath and chest tightness, I will administer one dose of IV Lasix 40 mg, and will then start oral Lasix 40 mg bid. Continue Coreg 6.25 mg bid. GFR 25 ml/min (BUN 41, creatinine 1.62), indicative of cardiorenal syndrome. I would continue ACEI, hydralazine and Imdur. His QRS duration is 118 ms and given his severely reduced EF, he would likely benefit from a biventricular pacemaker/ICD as it would provide more efficient cardiac synchrony, as well as for prophylaxis against malignant ventricular arrhythmias, respectively. Again, he will likely need to be on scheduled 40 mg Lasix bid at the time of discharge, with the addition of prn metolazone for increasing dyspnea/leg swelling. Troponins negative, ruling out an acute coronary syndrome. Now on KCl replacement, K 3.9 today (aim > 4).  2. HTN: controlled on present therapy. Again, I would recommend continuing ACEI, Coreg, hydralazine, and Imdur at present doses.  3. CKD:GFR 25 ml/min. BUN 41, creatinine 1.62. Indicative of cardiorenal syndrome. 4. IDDM: management as per primary team.  5. Sleep apnea: did not use CPAP.       Kate Sable, M.D., F.A.C.C.

## 2013-06-02 NOTE — Progress Notes (Signed)
Appears confused this am. Walking around , attempting to void in the trash can. Reoriented him to time and place. States he was still sleepy. Assisted back to bed. Bed alarm on. Was alert all last night. Up ad lib. Sats were 91 % on room air. Placed back on 2 L [place]

## 2013-06-02 NOTE — Progress Notes (Signed)
Reported off to [place] RN that the students are not giving any more medications today.

## 2013-06-02 NOTE — Progress Notes (Addendum)
Noted to be confused last night.  This morning he has mild shortness of breath.  Afebrile with stable vital signs. He appears calm and comfortable. Speech is fluent and clear. Lungs are generally clear without wheezes rales or rhonchi. Normal respiratory effort. Able to speak in full sentences. Cardiovascular regular rate and rhythm. No murmur, rub or gallop. No lower extremity edema.  -6.9 L since admission. Creatinine and BUN slightly higher 41/2.62 respectively.  Overall he appears better. Confusion was transient last night and may have been secondary to fatigue. In regard to his heart failure overall he is improved although somewhat short of breath this morning. Case was discussed with Dr. Bronson Ing who has recommended one-time dose of Lasix and then oral Lasix. Metalozone daily PRN. Dr. Bronson Ing does feel patient ready to d/c later today.  Plan to continue ACE inhibitor, Coreg, hydralazine, Imdur at current dose is. Lasix 40 mg by mouth twice a day at discharge. Potassium supplementation daily 20 mEq.  Should f/u with cardiologist/PCP in 1 week, repeat BMP at that time suggested.  Murray Hodgkins, MD [facility] [phone]

## 2013-06-02 NOTE — Progress Notes (Signed)
Patient seen, independently examined and chart reviewed. I agree with exam, assessment and plan discussed with Dyanne Carrel, NP.  Late entry, seen this AM, see my progress note.  Student nurse noted the patient did have somnolence and possible apnea. He has a history of sleep apnea. He immediately improved. Currently he is calm and comfortable, fully awake and alert. Is on multiple chronic pain medications doses of which will be reduced. Suspect simple sleep apnea which is a chronic diagnosis. No evidence of severe complication at this point.  Murray Hodgkins, MD [facility] [phone]

## 2013-06-02 NOTE — Progress Notes (Signed)
Inpatient Diabetes Program Recommendations  AACE/ADA: New Consensus Statement on Inpatient Glycemic Control (2013)  Target Ranges:  Prepandial:   less than 140 mg/dL      Peak postprandial:   less than 180 mg/dL (1-2 hours)      Critically ill patients:  140 - 180 mg/dL   Results for JEANCARLO, LEFFLER (MRN [id number]) as of 06/02/2013 11:38  Ref. Range 06/01/2013 07:45 06/01/2013 11:41 06/01/2013 16:40 06/01/2013 21:01 06/02/2013 08:18  Glucose-Capillary Latest Range: 70-99 mg/dL 208 (H) 71 243 (H) 123 (H) 216 (H)   Diabetes history: DM2  Outpatient Diabetes medications: 70/30 10-20 units BID  Current orders for Inpatient glycemic control: Novolog 0-15 units AC and Novolog 0-5 units HS  Inpatient Diabetes Program Recommendations Insulin - Basal: May want to consider ordering low dose basal; recommend Levemir 5 units Q24H. Insulin-Correction: If basal insulin is ordered, recommend decreasing Novolog correction to sensitive correction scale.  Note: Blood glucose ranged from 71-243 mg/dl on 2/5 and fasting glucose is 216 this morning.  Noted when patient was given Novolog 5 units (per moderate correction scale) for 208 mg/dl at 7:45 the follow CBG was 71 mg/dl and when CBG was 243 mg/dl at 16:40 he received Novolog 5 units (per moderate correction scale) and subsequent CBG was 123 mg/dl. Recommend ordering low dose basal and decreasing Novolog correction to sensitive scale to improve inpatient glycemic control.  Will continue to follow.  Thanks, Barnie Alderman, RN, MSN, CCRN Diabetes Coordinator Inpatient Diabetes Program [phone] (Team Pager) [phone] ([facility] office) [phone] [facility] office)

## 2013-06-02 NOTE — Progress Notes (Signed)
Pt currently using CPAP without any difficulty;will continue to monitor through the night.

## 2013-06-02 NOTE — Progress Notes (Addendum)
Informed pt's nurse, Jenny Reichmann RN, that students will be giving po and sq medications this morning from 10:00 until 15:00 and will not be doing management of IV or IV medication because they have not been checked off on that yet.

## 2013-06-03 DIAGNOSIS — G9340 Encephalopathy, unspecified: Secondary | ICD-10-CM

## 2013-06-03 LAB — BASIC METABOLIC PANEL
BUN: 43 mg/dL — AB (ref 6–23)
CHLORIDE: 98 meq/L (ref 96–112)
CO2: 29 mEq/L (ref 19–32)
Calcium: 9.3 mg/dL (ref 8.4–10.5)
Creatinine, Ser: 2.44 mg/dL — ABNORMAL HIGH (ref 0.50–1.35)
GFR, EST AFRICAN AMERICAN: 32 mL/min — AB (ref >90)
GFR, EST NON AFRICAN AMERICAN: 28 mL/min — AB (ref >90)
Glucose, Bld: 181 mg/dL — ABNORMAL HIGH (ref 70–99)
POTASSIUM: 4.1 meq/L (ref 3.7–5.3)
SODIUM: 141 meq/L (ref 137–147)

## 2013-06-03 LAB — GLUCOSE, CAPILLARY
GLUCOSE-CAPILLARY: 167 mg/dL — AB (ref 70–99)
Glucose-Capillary: 143 mg/dL — ABNORMAL HIGH (ref 70–99)
Glucose-Capillary: 187 mg/dL — ABNORMAL HIGH (ref 70–99)
Glucose-Capillary: 232 mg/dL — ABNORMAL HIGH (ref 70–99)
Glucose-Capillary: 187 mg/dL — ABNORMAL HIGH (ref 70–99)

## 2013-06-03 MED ORDER — OXYCODONE-ACETAMINOPHEN 5-325 MG PO TABS
1.0000 | ORAL_TABLET | Freq: Three times a day (TID) | ORAL | Status: DC | PRN
  Administered 2013-06-03 – 2013-06-04 (×3): 1 via ORAL
  Filled 2013-06-03 (×3): qty 1

## 2013-06-03 NOTE — Progress Notes (Signed)
[facility] PROGRESS NOTE  [name] [id number] DOB: 03/02/1956 DOA: 05/30/2013 PCP: Annia Belt, MD treated by a neurologist in [place], (Dr. Sherlean Foot, MD)  Summary: 58 year old man presents to the emergency department with progressive shortness of breath, orthopnea, PND, leg edema, weight gain. He was admitted for acute systolic congestive heart failure. Heart failure rapidly improved, seen in consultation with cardiology. Then developed some confusion and was further monitored and pain medications adjusted.  Assessment/Plan: 1. Acute on chronic systolic congestive heart failure, ejection fraction 20-25%. Now well compensated. Presumed nonischemic cardiomyopathy. 2. Acute encephalopathy. Occurred yesterday and overnight. Now resolved. Further investigation is notable for numerous hospitalizations for similar symptoms (see bottom of note for summary), conclusion reached before has been polypharmacy. Although patient reported earlier in hospitalization he was taking morphine immediate release as well as other medications, in the presence of his wife he notes that this is not the case. He should be on fentanyl, Percocet as needed and Klonopin for myoclonic tremors. His encephalopathy has resolved and is likely related to polypharmacy. We discussed considering narrowing his treatment as an outpatient. No focal neurologic deficits, no further evaluation suggested. 3. Diabetes mellitus with neuropathy and gastroparesis. 4. Obstructive sleep apnea. 5. Chronic kidney disease stage III. 6. Tobacco dependence. 7. Unspecified myoclonic disorder continue Klonopin..   Overall he appears clinically improved. Heart failure is well compensated. In regard to the confusion overnight this is related to polypharmacy and has resolved. His chronic medications will be adjusted.  Continue diuretics as recommended by cardiology. Appears to be euvolemic.  I spent 30 minutes at the bedside with  the wife and patient and reviewed all testing thus far, diagnoses, treatment, cardiology recommendations and summarized his previous records. I discussed polypharmacy and the patient's listing of multiple medications which in the presence of his wife he now reports he does not take (Baclofen, MSIR). We discussed monitoring overnight and discharged home 2/8.  Code Status: full code DVT prophylaxis: heparin Family Communication:  Disposition Plan: home  Murray Hodgkins, MD  [facility]  Pager [phone] If 7PM-7AM, please contact night-coverage at [email or web address], password [facility] 06/03/2013, 2:19 PM  LOS: 4 days   Consultants:  Cardiology  Procedures:    HPI/Subjective: Noted to be confused overnight but today is doing well and has no complaints.  Objective: Filed Vitals:   06/03/13 0534 06/03/13 0934 06/03/13 1321 06/03/13 1338  BP: 146/86 142/84 140/85 116/65  Pulse: 113   66  Temp: 97.6 F (36.4 C)   98 F (36.7 C)  TempSrc: Oral   Oral  Resp: 18   19  Height:      Weight: 94.7 kg (208 lb 12.4 oz)     SpO2: 98%   99%    Intake/Output Summary (Last 24 hours) at 06/03/13 1419 Last data filed at 06/03/13 1259  Gross per 24 hour  Intake   1416 ml  Output   2175 ml  Net   -759 ml     Filed Weights   06/01/13 1229 06/02/13 1700 06/03/13 0534  Weight: 93.2 kg (205 lb 7.5 oz) 96.1 kg (211 lb 13.8 oz) 94.7 kg (208 lb 12.4 oz)    Exam:   Afebrile, vital signs stable. No hypoxia this morning.  Cardiovascular regular rate and rhythm. No murmur, rub gallop. No lower extremity edema.  Respiratory clear to auscultation bilaterally. No wheezes, rales rhonchi. Normal respiratory effort.  Psychiatric grossly normal mood and affect. Speech fluent and appropriate.  Data Reviewed:  BUN without  significant change. Creatinine improved. Potassium normal. Blood sugars stable.  Scheduled Meds: . aspirin EC  81 mg Oral Daily  . baclofen  5 mg Oral TID  . carvedilol  6.25  mg Oral BID WC  . clonazePAM  0.5 mg Oral Daily  . fentaNYL  100 mcg Transdermal Q72H  . FLUoxetine  40 mg Oral Daily  . furosemide  40 mg Oral BID  . gabapentin  400 mg Oral TID  . heparin  5,000 Units Subcutaneous Q8H  . hydrALAZINE  10 mg Oral Q8H  . insulin aspart  0-15 Units Subcutaneous TID WC  . insulin aspart  0-5 Units Subcutaneous QHS  . isosorbide mononitrate  15 mg Oral Daily  . lisinopril  5 mg Oral Daily  . modafinil  100 mg Oral BID  . pantoprazole  40 mg Oral Daily  . polyethylene glycol  17 g Oral Daily  . potassium chloride  20 mEq Oral Daily  . rOPINIRole  4 mg Oral QHS  . sodium chloride  3 mL Intravenous Q12H   Continuous Infusions:   Principal Problem:   Acute systolic HF (heart failure) Active Problems:   CKD (chronic kidney disease), stage III   OSA on CPAP   DM type 2 with diabetic peripheral neuropathy   Anemia   Acute systolic heart failure   Acute on chronic renal failure   Time spent 35 minutes greater than 50% in counseling and coordination of care.  Summary of records.  Admitted 01/2011 for altered mental status. Toxic metabolic encephalopathy thought to be secondary to polypharmacy including, Klonopin, baclofen, narcotics, trazodone. EEG with infrequent left temporal epileptiform discharges, started on Keppra.  01/2011 EEG Slightly abnormal recording showing a couple of  epileptiform discharges involving the left temporal area. No  electrographic seizures, however.  Admitted 12/03/2011 for altered mental status. Improved spontaneously and discharged.  Admitted 12/20/2011 again for altered mental status EEG during that admission was read as normal. Seen by neurology during that admission at that time per documentation "Wife is extremely hostile during the examination". At that time wife reported significant sleep apnea. Patient's home medication includes multiple sedating Rx :Baclofen, Clonazepam, Neurontin, Melatonin, Roxicodone, Requipe and  trazodone. Neurologist impression "58 YO male who has had multiple staring episodes associated with decreased alertness post episode according to wife. Recent EEG and past MRI/CT head have been negative. Patient is on multiple sedating medications at home and has known renal insufficiency which may be adding to his complaint of chronic somnolence, In addition patient haas known OSA which has not been reevaluated by his pulmonologist in past two years. Etiology of staring spells unclear.  Patient is demanding more Oxycodone  Differential includes:  1) Narcolepsy  2) This is less likely to be a seizure, he is already on benzos and Neurontin which is an AED.  3) Parasomnia  Recommend:  1) Continue Keppra 500 mg BID and have follow up with Dr. Tonye Royalty from [facility]. Patient may need to have EMU to further investigate staring spells.  2) limit sedating medications while in hospital  3) Follow up with Pulmonologist for CPAP and OSA  4)Patient has been advised to not drive, operate heavy machinery, perform activities at heights and participate in water activities until release by outpatient physician. Wife has disagreed to follow this recommendations  5) Provigil 100 mg po bid  6) D/C Trazadone  7) F/U with Dr. Tonye Royalty"  Admitted 11/2012 for decreased level of consciousness. Per history of present illness the wife  reported the patient will sleep for 2 days in row at times. At time of admission he was noted to be at baclofen, Klonopin, gabapentin, morphine immediate release 30 mg every 4 hours as needed, Phenergan, tramadol. He was monitored, medications withheld improved to baseline and was discharged home. Not on Keppra.   EEG 11/2012: Moderate generalized slowing. No epileptiform activity.    records via care everywhere Neurology evaluation 01/2012 Would like to obtain a sleep study to look for a primary sleep disorder. He and his wife requested to see a sleep specialist (he also needs re-titration of his CPAP  device), so rather than order the study we have refered him to Dr. Dwyane Dee for evaluation.  The next step in work-up would likely be routine sleep-deprived EEG here at our facility, versus prolonged EEG monitoring. Currently, the infrequency of his spells reduces the potential yield of prolonged monitoring.   01/2012 EEG INTERPRETATION: Normal awake and asleep EEG. A normal EEG does not rule out seizure disorder. Clinical correlation is required.    Hospitalized [place] 02/2013 for 9 days for narcotic overdose. Baclofen, morphine immediate release, morphine concentrate but discontinued. Started on fentanyl, gabapentin, oral Dilaudid. Continued on trazodone, Klonopin, Keppra   04/10/2013 Dr. Wess Botts pain medicine "I recommended we make it more efficient, continue the fentanyl patch, put him on Percocet three times daily and start some adjuvants such as Zanaflex and gabapentin."   Imaging  CT head 03/15/2013: No acute abnormalities.  MRI brain 11/2012: Premature age atrophy. No acute intracranial findings.

## 2013-06-03 NOTE — Plan of Care (Signed)
Pt educated about possible side effects with pain meds.  Pt stated he wanted pain meds anyway.

## 2013-06-03 NOTE — Plan of Care (Addendum)
Pt appears to be A/O X4 and stable on his feet.  Neurological concerns from last night appear to have gone away.    Pt requesting morphine  - which he states he was given last??? (Not charted).  Pt educated that if Morphine was given before bed last night is might have contributed to increased confusion.  Pt insisted on getting morphine anyhow.

## 2013-06-04 LAB — GLUCOSE, CAPILLARY
GLUCOSE-CAPILLARY: 170 mg/dL — AB (ref 70–99)
GLUCOSE-CAPILLARY: 153 mg/dL — AB (ref 70–99)

## 2013-06-04 MED ORDER — CARVEDILOL 6.25 MG PO TABS: 6.2500 mg | ORAL_TABLET | Freq: Two times a day (BID) | ORAL | Status: DC

## 2013-06-04 MED ORDER — METOLAZONE 2.5 MG PO TABS: 2.5000 mg | ORAL_TABLET | Freq: Every day | ORAL | Status: DC | PRN

## 2013-06-04 MED ORDER — OXYCODONE-ACETAMINOPHEN 5-325 MG PO TABS: 1.0000 | ORAL_TABLET | Freq: Three times a day (TID) | ORAL | Status: DC | PRN

## 2013-06-04 MED ORDER — LISINOPRIL 5 MG PO TABS: 5.0000 mg | ORAL_TABLET | Freq: Every day | ORAL | Status: DC

## 2013-06-04 MED ORDER — FUROSEMIDE 40 MG PO TABS: 40.0000 mg | ORAL_TABLET | Freq: Two times a day (BID) | ORAL | Status: DC

## 2013-06-04 MED ORDER — HYDRALAZINE HCL 10 MG PO TABS: 10.0000 mg | ORAL_TABLET | Freq: Three times a day (TID) | ORAL | Status: DC

## 2013-06-04 MED ORDER — ISOSORBIDE MONONITRATE 15 MG HALF TABLET: 15.0000 mg | ORAL_TABLET | Freq: Every day | ORAL | Status: DC

## 2013-06-04 NOTE — Discharge Summary (Addendum)
Physician Discharge Summary  [name] HDQ:[id number] DOB: 04-21-1956 DOA: 05/30/2013  PCP: Larry Belt, MD Neurologist in [place], (Dr. Sherlean Foot, MD)  Dr. Wess [name] pain medicine  Cardiologist Larry Seat, MD  Admit date: 05/30/2013 Discharge date: 06/04/2013  Recommendations for Outpatient Follow-up:  1. Continue treatment of chronic systolic congestive heart failure 2. Consider repeat basic metabolic panel in the next week to followup kidney function, potassium. 3. Continued treatment of chronic pain and review of multiple sedating medications. No Rx for narcotics or benzodiazepines given. 4. Continue treatment for restrictive sleep apnea 5. Continue to recommend smoking cessation   Follow-up Information   Follow up with [name], [name], MD. Schedule an appointment as soon as possible for a visit in 1 week.   Contact information:   [address] [place] [address] [phone]       Follow up with Larry Monica, MD. Schedule an appointment as soon as possible for a visit in 1 week.   Specialty:  Cardiology   Contact information:   [place] [id number] [phone]       Follow up with [facility], MD. Schedule an appointment as soon as possible for a visit in 2 weeks.   Specialty:  Anesthesiology   Contact information:   [place] [id number]      Discharge Diagnoses:  1. Acute on chronic systolic congestive heart failure 2. Acute encephalopathy 3. Diabetes mellitus with neuropathy 4. Chronic pain on multiple sedating medications 5. Tobacco dependence  Discharge Condition: Improved Disposition: Home  Diet recommendation: Heart healthy  Filed Weights   06/02/13 1700 06/03/13 0534 06/04/13 0531  Weight: 96.1 kg (211 lb 13.8 oz) 94.7 kg (208 lb 12.4 oz) 95.8 kg (211 lb 3.2 oz)    History of present illness:  58 year old man presents to the emergency department with progressive shortness of  breath, orthopnea, PND, leg edema, weight gain. He was admitted for acute systolic congestive heart failure.   Hospital Course:  Heart failure rapidly improved with diuresis, seen in consultation with cardiology and medications were adjusted. Developed some confusion and was further monitored and pain medications adjusted. History is notable for multiple hospitalizations for altered mental status with suspected adverse drug reaction from polypharmacy and multiple sedating medications. During this hospitalization the patient reported being on baclofen and morphine, upon interview again with wife present (with patient's permission) it came to light tah the patient is no longer on morphine and takes baclofen only rarely. Once medications were adjusted to his outpatient pain medication regimen there was no further encephalopathy. He is now stable for discharge. Individual issues as below.  1. Acute on chronic systolic congestive heart failure, ejection fraction 20-25%. Well compensated. Presumed nonischemic cardiomyopathy. 2. Acute encephalopathy. Resolved. Secondary to polypharmacy. 3. Diabetes mellitus with neuropathy and gastroparesis. Stable. 4. Obstructive sleep apnea. Stable, continue CPAP. 5. Chronic kidney disease stage III. 6. Tobacco dependence. 7. Unspecified myoclonic disorder, continue Klonopin.. Recommend stopping smoking.  Recommendations per cardiology: Lasix 40 mg BID (home dose), continue Coreg 6.25 mg BID, ACE-I, hydralazine and Imdur. Consider biventricular pacemaker/ICD. Daily PRN metolazone for increasing dyspnea/leg swelling.  Cardiology will communicate with patient's primary cardiologist  Recommend close outpatient f/u with PCP, primary cardiologist.  Recommend considering weaning off sedating medications as possible.  Consultants:  Cardiology Procedures:  None  Discharge Instructions  Discharge Orders   Future Orders Complete By Expires   ([name])  Call MD:  Anytime you  have any of the following symptoms: 1) 3 pound weight gain in 24 hours or 5 pounds in 1 week 2) shortness of breath, with or without a dry hacking cough 3) swelling in the hands, feet or stomach 4) if you have to sleep on extra pillows at night in order to breathe.  As directed    Diet - low sodium heart healthy  As directed    Discharge instructions  As directed    Comments:     Call your physician or seek immediate medical attention for increased swelling, shortness of breath or worsening of condition. Cardiologist has recommended several adjustments to your heart medications. Be sure to followup with your own cardiologist within one week and is suggested followup with a physician as well. Stop smoking. Be sure to review all your medications and discuss with your primary care physician, pain management physician and cardiologist.   Increase activity slowly  As directed        Medication List    STOP taking these medications       baclofen 5 mg Tabs tablet  Commonly known as:  LIORESAL     morphine 30 MG tablet  Commonly known as:  MSIR     promethazine 25 MG tablet  Commonly known as:  PHENERGAN     traMADol 50 MG tablet  Commonly known as:  ULTRAM      TAKE these medications       carvedilol 6.25 MG tablet  Commonly known as:  COREG  Take 1 tablet (6.25 mg total) by mouth 2 (two) times daily with a meal.     CENTRUM SILVER ULTRA MENS Tabs  Take 1 tablet by mouth daily.     clonazePAM 0.5 MG tablet  Commonly known as:  KLONOPIN  Take 0.5-1 mg by mouth 2 (two) times daily as needed for anxiety (1 in the morning and 2 at bedtime).     fentaNYL 100 MCG/HR  Commonly known as:  DURAGESIC - dosed mcg/hr  Place 100 mcg onto the skin every 3 (three) days.     FLUoxetine 40 MG capsule  Commonly known as:  PROZAC  Take 40 mg by mouth 2 (two) times daily.     furosemide 40 MG tablet  Commonly known as:  LASIX  Take 1 tablet (40 mg total) by mouth 2 (two)  times daily.     gabapentin 400 MG capsule  Commonly known as:  NEURONTIN  Take 400 mg by mouth 3 (three) times daily.     hydrALAZINE 10 MG tablet  Commonly known as:  APRESOLINE  Take 1 tablet (10 mg total) by mouth every 8 (eight) hours.     insulin aspart protamine- aspart (70-30) 100 UNIT/ML injection  Commonly known as:  NOVOLOG MIX 70/30  Inject 10-20 Units into the skin 2 (two) times daily with a meal. Units vary based on food consumed.     isosorbide mononitrate 15 mg Tb24 24 hr tablet  Commonly known as:  IMDUR  Take 0.5 tablets (15 mg total) by mouth daily.     lisinopril 5 MG tablet  Commonly known as:  PRINIVIL,ZESTRIL  Take 1 tablet (5 mg total) by mouth daily.     metolazone 2.5 MG tablet  Commonly known as:  ZAROXOLYN  Take 1 tablet (2.5 mg total) by mouth daily as needed (for increased swelling. After taking a dose, call you cardiologist for further instructions.).     modafinil 200 MG tablet  Commonly known  as:  PROVIGIL  Take 100 mg by mouth 2 (two) times daily.     ondansetron 4 MG disintegrating tablet  Commonly known as:  ZOFRAN-ODT  Take 1 tablet (4 mg total) by mouth every 4 (four) hours as needed for nausea.     oxyCODONE-acetaminophen 5-325 MG per tablet  Commonly known as:  PERCOCET/ROXICET  Take 1 tablet by mouth every 8 (eight) hours as needed for severe pain.     pantoprazole 40 MG tablet  Commonly known as:  PROTONIX  Take 40 mg by mouth daily.     polyethylene glycol packet  Commonly known as:  MIRALAX / GLYCOLAX  Take 17 g by mouth daily as needed. For constipation     rOPINIRole 3 MG tablet  Commonly known as:  REQUIP  Take 6 mg by mouth at bedtime.       Allergies  Allergen Reactions  . Ms Contin [Morphine Sulfate Er]     Doesn't like to take in liquid form( burns and doesn't help) (pill form works fine)     The results of significant diagnostics from this hospitalization (including imaging, microbiology, ancillary and  laboratory) are listed below for reference.    Significant Diagnostic Studies: Dg Chest 2 View  05/30/2013   CLINICAL DATA:  Cough, shortness of breath.  EXAM: CHEST  2 VIEW  COMPARISON:  12/08/2012  FINDINGS: Diffuse interstitial prominence throughout the lungs. Mild peribronchial thickening, stable. Heart is normal size. No effusions or acute bony abnormality. Spinal stimulator device noted over the mid thoracic spine.  IMPRESSION: Interstitial prominence throughout the lungs, with peribronchial thickening. Suspect chronic interstitial lung disease/ bronchitis.   Electronically Signed   By: Rolm Baptise M.D.   On: 05/30/2013 20:54   Labs: Basic Metabolic Panel:  Recent Labs Lab 05/30/13 2025 06/01/13 0545 06/02/13 0559 06/03/13 0620  NA 140 143 141 141  K 4.3 3.5* 3.9 4.1  CL 98 100 98 98  CO2 28 30 30 29   GLUCOSE 223* 228* 197* 181*  BUN 29* 32* 41* 43*  CREATININE 2.09* 2.39* 2.62* 2.44*  CALCIUM 8.9 8.8 8.8 9.3   Liver Function Tests:  Recent Labs Lab 05/30/13 2025  AST 19  ALT 17  ALKPHOS 111  BILITOT 0.4  PROT 7.1  ALBUMIN 3.6   CBC:  Recent Labs Lab 05/30/13 2025  WBC 9.0  NEUTROABS 6.6  HGB 12.0*  HCT 35.7*  MCV 87.7  PLT 200   Cardiac Enzymes:  Recent Labs Lab 05/30/13 2025 05/31/13 0300 05/31/13 0837 05/31/13 1435  TROPONINI <0.30 <0.30 <0.30 <0.30     Recent Labs  05/30/13 2025  PROBNP 9807.0*   CBG:  Recent Labs Lab 06/03/13 1148 06/03/13 1637 06/03/13 2038 06/04/13 0745 06/04/13 1120  GLUCAP 167* 232* 187* 170* 153*    Principal Problem:   Acute systolic HF (heart failure) Active Problems:   CKD (chronic kidney disease), stage III   OSA on CPAP   DM type 2 with diabetic peripheral neuropathy   Anemia   Acute systolic heart failure   Acute on chronic renal failure   Time coordinating discharge: 35 minutes  Signed:  Murray Hodgkins, MD [facility] 06/04/2013, 12:32 PM

## 2013-06-04 NOTE — Progress Notes (Signed)
PT stated he could put CPAP on.

## 2013-06-04 NOTE — Progress Notes (Signed)
[facility] PROGRESS NOTE  [name] QIO:[id number] DOB: 02-Jun-1955 DOA: 05/30/2013 PCP: Annia Belt, MD Neurologist in [place], (Dr. Sherlean Foot, MD) Dr. Wess Botts pain medicine Cardiologist Neva Seat, MD  Summary: 58 year old man presents to the emergency department with progressive shortness of breath, orthopnea, PND, leg edema, weight gain. He was admitted for acute systolic congestive heart failure. Heart failure rapidly improved, seen in consultation with cardiology. Then developed some confusion and was further monitored and pain medications adjusted.  Assessment/Plan: 1. Acute on chronic systolic congestive heart failure, ejection fraction 20-25%. Wall compensated. Presumed nonischemic cardiomyopathy. 2. Acute encephalopathy. Resolved. Secondary to polypharmacy. 3. Diabetes mellitus with neuropathy and gastroparesis. Stable. 4. Obstructive sleep apnea. 5. Chronic kidney disease stage III. 6. Tobacco dependence. 7. Unspecified myoclonic disorder, continue Klonopin.Marland Kitchen   Discharge home today.  Recommend stopping smoking.  Recommendations per cardiology: Lasix 40 mg BID (home dose), continue Coreg 6.25 mg BID, ACE-I, hydralazine and Imdur. Consider biventricular pacemaker/ICD. Daily PRN metolazone for increasing dyspnea/leg swelling.  Cardiology will communicate with patient's primary cardiologist  Recommend close outpatient f/u with PCP, cardiologist.  Recommend considering weaning off sedating medications as possible.  Code Status: full code DVT prophylaxis: heparin Family Communication:  Disposition Plan: home  Murray Hodgkins, MD  [facility]  Pager [phone] If 7PM-7AM, please contact night-coverage at [email or web address], password [facility] 06/04/2013, 12:10 PM  LOS: 5 days   Consultants:  Cardiology  Procedures:    HPI/Subjective: No issues overnight. No complaints today. Breathing fine. Ready to go home.  Objective: Filed Vitals:   06/03/13  1757 06/03/13 2110 06/04/13 0531 06/04/13 0848  BP: 112/72 155/82 124/74 126/82  Pulse:  103 66   Temp:  97.7 F (36.5 C) 98.4 F (36.9 C)   TempSrc:  Oral Oral   Resp:  18 18   Height:      Weight:   95.8 kg (211 lb 3.2 oz)   SpO2:  90% 100%     Intake/Output Summary (Last 24 hours) at 06/04/13 1210 Last data filed at 06/04/13 0900  Gross per 24 hour  Intake   1720 ml  Output   2000 ml  Net   -280 ml     Filed Weights   06/02/13 1700 06/03/13 0534 06/04/13 0531  Weight: 96.1 kg (211 lb 13.8 oz) 94.7 kg (208 lb 12.4 oz) 95.8 kg (211 lb 3.2 oz)    Exam:   Afebrile, vital signs stable. No hypoxia.  General: Appears calm and comfortable. Ambulating in hallway without difficulty.   Psychiatric grossly normal mood and affect. Speech fluent and appropriate.  Cardiovascular regular rate and rhythm. No murmur, rub or gallop.  Respiratory clear to auscultation bilaterally. No wheezes, rales or rhonchi. Normal respiratory effort.  Telemetry sinus rhythm  Data Reviewed:  Blood sugars stable.  Scheduled Meds: . aspirin EC  81 mg Oral Daily  . carvedilol  6.25 mg Oral BID WC  . fentaNYL  100 mcg Transdermal Q72H  . FLUoxetine  40 mg Oral Daily  . furosemide  40 mg Oral BID  . gabapentin  400 mg Oral TID  . heparin  5,000 Units Subcutaneous Q8H  . hydrALAZINE  10 mg Oral Q8H  . insulin aspart  0-15 Units Subcutaneous TID WC  . insulin aspart  0-5 Units Subcutaneous QHS  . isosorbide mononitrate  15 mg Oral Daily  . lisinopril  5 mg Oral Daily  . modafinil  100 mg Oral BID  . pantoprazole  40 mg Oral Daily  .  polyethylene glycol  17 g Oral Daily  . potassium chloride  20 mEq Oral Daily  . rOPINIRole  4 mg Oral QHS  . sodium chloride  3 mL Intravenous Q12H   Continuous Infusions:   Principal Problem:   Acute systolic HF (heart failure) Active Problems:   CKD (chronic kidney disease), stage III   OSA on CPAP   DM type 2 with diabetic peripheral neuropathy    Anemia   Acute systolic heart failure   Acute on chronic renal failure

## 2013-06-23 ENCOUNTER — Emergency Department (HOSPITAL_COMMUNITY): Payer: Medicare HMO

## 2013-06-23 ENCOUNTER — Encounter (HOSPITAL_COMMUNITY): Payer: Self-pay | Admitting: Emergency Medicine

## 2013-06-23 ENCOUNTER — Inpatient Hospital Stay (HOSPITAL_COMMUNITY)
Admission: EM | Admit: 2013-06-23 | Discharge: 2013-06-28 | DRG: 208 | Disposition: A | Discharge status: Home Health | Payer: Medicare HMO | Attending: Internal Medicine | Admitting: Internal Medicine

## 2013-06-23 DIAGNOSIS — Z9989 Dependence on other enabling machines and devices: Secondary | ICD-10-CM

## 2013-06-23 DIAGNOSIS — K3184 Gastroparesis: Secondary | ICD-10-CM

## 2013-06-23 DIAGNOSIS — N189 Chronic kidney disease, unspecified: Secondary | ICD-10-CM

## 2013-06-23 DIAGNOSIS — G929 Unspecified toxic encephalopathy: Secondary | ICD-10-CM | POA: Diagnosis present

## 2013-06-23 DIAGNOSIS — J9690 Respiratory failure, unspecified, unspecified whether with hypoxia or hypercapnia: Secondary | ICD-10-CM

## 2013-06-23 DIAGNOSIS — G894 Chronic pain syndrome: Secondary | ICD-10-CM

## 2013-06-23 DIAGNOSIS — G4733 Obstructive sleep apnea (adult) (pediatric): Secondary | ICD-10-CM

## 2013-06-23 DIAGNOSIS — M79609 Pain in unspecified limb: Secondary | ICD-10-CM

## 2013-06-23 DIAGNOSIS — R892 Abnormal level of other drugs, medicaments and biological substances in specimens from other organs, systems and tissues: Secondary | ICD-10-CM

## 2013-06-23 DIAGNOSIS — G609 Hereditary and idiopathic neuropathy, unspecified: Secondary | ICD-10-CM

## 2013-06-23 DIAGNOSIS — I5023 Acute on chronic systolic (congestive) heart failure: Secondary | ICD-10-CM | POA: Diagnosis present

## 2013-06-23 DIAGNOSIS — J9601 Acute respiratory failure with hypoxia: Secondary | ICD-10-CM

## 2013-06-23 DIAGNOSIS — E876 Hypokalemia: Secondary | ICD-10-CM | POA: Diagnosis present

## 2013-06-23 DIAGNOSIS — T4275XA Adverse effect of unspecified antiepileptic and sedative-hypnotic drugs, initial encounter: Secondary | ICD-10-CM | POA: Diagnosis present

## 2013-06-23 DIAGNOSIS — IMO0001 Reserved for inherently not codable concepts without codable children: Secondary | ICD-10-CM

## 2013-06-23 DIAGNOSIS — F17200 Nicotine dependence, unspecified, uncomplicated: Secondary | ICD-10-CM | POA: Diagnosis present

## 2013-06-23 DIAGNOSIS — K219 Gastro-esophageal reflux disease without esophagitis: Secondary | ICD-10-CM

## 2013-06-23 DIAGNOSIS — M545 Low back pain, unspecified: Secondary | ICD-10-CM

## 2013-06-23 DIAGNOSIS — T40605A Adverse effect of unspecified narcotics, initial encounter: Secondary | ICD-10-CM | POA: Diagnosis present

## 2013-06-23 DIAGNOSIS — I129 Hypertensive chronic kidney disease with stage 1 through stage 4 chronic kidney disease, or unspecified chronic kidney disease: Secondary | ICD-10-CM | POA: Diagnosis present

## 2013-06-23 DIAGNOSIS — Z720 Tobacco use: Secondary | ICD-10-CM

## 2013-06-23 DIAGNOSIS — G4700 Insomnia, unspecified: Secondary | ICD-10-CM

## 2013-06-23 DIAGNOSIS — M720 Palmar fascial fibromatosis [Dupuytren]: Secondary | ICD-10-CM

## 2013-06-23 DIAGNOSIS — M542 Cervicalgia: Secondary | ICD-10-CM

## 2013-06-23 DIAGNOSIS — M25579 Pain in unspecified ankle and joints of unspecified foot: Secondary | ICD-10-CM

## 2013-06-23 DIAGNOSIS — F528 Other sexual dysfunction not due to a substance or known physiological condition: Secondary | ICD-10-CM

## 2013-06-23 DIAGNOSIS — J189 Pneumonia, unspecified organism: Secondary | ICD-10-CM

## 2013-06-23 DIAGNOSIS — R209 Unspecified disturbances of skin sensation: Secondary | ICD-10-CM

## 2013-06-23 DIAGNOSIS — J811 Chronic pulmonary edema: Secondary | ICD-10-CM

## 2013-06-23 DIAGNOSIS — I10 Essential (primary) hypertension: Secondary | ICD-10-CM

## 2013-06-23 DIAGNOSIS — F411 Generalized anxiety disorder: Secondary | ICD-10-CM

## 2013-06-23 DIAGNOSIS — Z823 Family history of stroke: Secondary | ICD-10-CM

## 2013-06-23 DIAGNOSIS — E785 Hyperlipidemia, unspecified: Secondary | ICD-10-CM

## 2013-06-23 DIAGNOSIS — F329 Major depressive disorder, single episode, unspecified: Secondary | ICD-10-CM

## 2013-06-23 DIAGNOSIS — Z79899 Other long term (current) drug therapy: Secondary | ICD-10-CM

## 2013-06-23 DIAGNOSIS — F3289 Other specified depressive episodes: Secondary | ICD-10-CM

## 2013-06-23 DIAGNOSIS — I428 Other cardiomyopathies: Secondary | ICD-10-CM

## 2013-06-23 DIAGNOSIS — N183 Chronic kidney disease, stage 3 unspecified: Secondary | ICD-10-CM

## 2013-06-23 DIAGNOSIS — R251 Tremor, unspecified: Secondary | ICD-10-CM

## 2013-06-23 DIAGNOSIS — D72829 Elevated white blood cell count, unspecified: Secondary | ICD-10-CM

## 2013-06-23 DIAGNOSIS — I5022 Chronic systolic (congestive) heart failure: Secondary | ICD-10-CM

## 2013-06-23 DIAGNOSIS — Z794 Long term (current) use of insulin: Secondary | ICD-10-CM

## 2013-06-23 DIAGNOSIS — M129 Arthropathy, unspecified: Secondary | ICD-10-CM

## 2013-06-23 DIAGNOSIS — G2581 Restless legs syndrome: Secondary | ICD-10-CM

## 2013-06-23 DIAGNOSIS — L739 Follicular disorder, unspecified: Secondary | ICD-10-CM

## 2013-06-23 DIAGNOSIS — G40409 Other generalized epilepsy and epileptic syndromes, not intractable, without status epilepticus: Secondary | ICD-10-CM

## 2013-06-23 DIAGNOSIS — I5021 Acute systolic (congestive) heart failure: Secondary | ICD-10-CM

## 2013-06-23 DIAGNOSIS — R112 Nausea with vomiting, unspecified: Secondary | ICD-10-CM

## 2013-06-23 DIAGNOSIS — J690 Pneumonitis due to inhalation of food and vomit: Secondary | ICD-10-CM

## 2013-06-23 DIAGNOSIS — M549 Dorsalgia, unspecified: Secondary | ICD-10-CM | POA: Diagnosis present

## 2013-06-23 DIAGNOSIS — N179 Acute kidney failure, unspecified: Secondary | ICD-10-CM

## 2013-06-23 DIAGNOSIS — R410 Disorientation, unspecified: Secondary | ICD-10-CM

## 2013-06-23 DIAGNOSIS — R4182 Altered mental status, unspecified: Secondary | ICD-10-CM

## 2013-06-23 DIAGNOSIS — G9340 Encephalopathy, unspecified: Secondary | ICD-10-CM

## 2013-06-23 DIAGNOSIS — D509 Iron deficiency anemia, unspecified: Secondary | ICD-10-CM | POA: Diagnosis present

## 2013-06-23 DIAGNOSIS — D649 Anemia, unspecified: Secondary | ICD-10-CM

## 2013-06-23 DIAGNOSIS — E870 Hyperosmolality and hypernatremia: Secondary | ICD-10-CM | POA: Diagnosis present

## 2013-06-23 DIAGNOSIS — I2510 Atherosclerotic heart disease of native coronary artery without angina pectoris: Secondary | ICD-10-CM

## 2013-06-23 DIAGNOSIS — E1142 Type 2 diabetes mellitus with diabetic polyneuropathy: Secondary | ICD-10-CM

## 2013-06-23 DIAGNOSIS — G92 Toxic encephalopathy: Secondary | ICD-10-CM | POA: Diagnosis present

## 2013-06-23 DIAGNOSIS — K5900 Constipation, unspecified: Secondary | ICD-10-CM | POA: Diagnosis present

## 2013-06-23 DIAGNOSIS — Z981 Arthrodesis status: Secondary | ICD-10-CM

## 2013-06-23 DIAGNOSIS — G5600 Carpal tunnel syndrome, unspecified upper limb: Secondary | ICD-10-CM

## 2013-06-23 DIAGNOSIS — E1149 Type 2 diabetes mellitus with other diabetic neurological complication: Secondary | ICD-10-CM

## 2013-06-23 DIAGNOSIS — I509 Heart failure, unspecified: Secondary | ICD-10-CM

## 2013-06-23 DIAGNOSIS — N390 Urinary tract infection, site not specified: Secondary | ICD-10-CM

## 2013-06-23 DIAGNOSIS — J9600 Acute respiratory failure, unspecified whether with hypoxia or hypercapnia: Principal | ICD-10-CM | POA: Diagnosis present

## 2013-06-23 DIAGNOSIS — R634 Abnormal weight loss: Secondary | ICD-10-CM

## 2013-06-23 DIAGNOSIS — K5909 Other constipation: Secondary | ICD-10-CM | POA: Diagnosis present

## 2013-06-23 DIAGNOSIS — R413 Other amnesia: Secondary | ICD-10-CM

## 2013-06-23 DIAGNOSIS — G253 Myoclonus: Secondary | ICD-10-CM

## 2013-06-23 DIAGNOSIS — I252 Old myocardial infarction: Secondary | ICD-10-CM

## 2013-06-23 HISTORY — DX: Chronic kidney disease, stage 3 (moderate): N18.3

## 2013-06-23 HISTORY — DX: Acute respiratory failure with hypoxia: J96.01

## 2013-06-23 HISTORY — DX: Other cardiomyopathies: I42.8

## 2013-06-23 HISTORY — DX: Chronic kidney disease, stage 3 unspecified: N18.30

## 2013-06-23 HISTORY — DX: Atherosclerotic heart disease of native coronary artery without angina pectoris: I25.10

## 2013-06-23 HISTORY — DX: Type 2 diabetes mellitus without complications: E11.9

## 2013-06-23 HISTORY — DX: Essential (primary) hypertension: I10

## 2013-06-23 LAB — BASIC METABOLIC PANEL
BUN: 39 mg/dL — ABNORMAL HIGH (ref 6–23)
CALCIUM: 9.3 mg/dL (ref 8.4–10.5)
CO2: 30 mEq/L (ref 19–32)
Chloride: 103 mEq/L (ref 96–112)
Creatinine, Ser: 2.49 mg/dL — ABNORMAL HIGH (ref 0.50–1.35)
GFR, EST AFRICAN AMERICAN: 31 mL/min — AB (ref >90)
GFR, EST NON AFRICAN AMERICAN: 27 mL/min — AB (ref >90)
GLUCOSE: 228 mg/dL — AB (ref 70–99)
POTASSIUM: 3.9 meq/L (ref 3.7–5.3)
Sodium: 146 mEq/L (ref 137–147)

## 2013-06-23 LAB — CBC
HCT: 40.3 % (ref 39.0–52.0)
HEMOGLOBIN: 13.1 g/dL (ref 13.0–17.0)
MCH: 28.8 pg (ref 26.0–34.0)
MCHC: 32.5 g/dL (ref 30.0–36.0)
MCV: 88.6 fL (ref 78.0–100.0)
Platelets: 232 10*3/uL (ref 150–400)
RBC: 4.55 MIL/uL (ref 4.22–5.81)
RDW: 14.7 % (ref 11.5–15.5)
WBC: 20.6 10*3/uL — ABNORMAL HIGH (ref 4.0–10.5)

## 2013-06-23 LAB — BLOOD GAS, ARTERIAL
Acid-Base Excess: 0.8 mmol/L (ref 0.0–2.0)
Bicarbonate: 25.8 mEq/L — ABNORMAL HIGH (ref 20.0–24.0)
FIO2: 100 %
O2 Saturation: 96.1 %
PATIENT TEMPERATURE: 37
PEEP: 5 cmH2O
RATE: 14 resp/min
TCO2: 23.4 mmol/L (ref 0–100)
VT: 600 mL
pCO2 arterial: 48.1 mmHg — ABNORMAL HIGH (ref 35.0–45.0)
pH, Arterial: 7.348 — ABNORMAL LOW (ref 7.350–7.450)
pO2, Arterial: 124 mmHg — ABNORMAL HIGH (ref 80.0–100.0)

## 2013-06-23 LAB — GLUCOSE, CAPILLARY
GLUCOSE-CAPILLARY: 216 mg/dL — AB (ref 70–99)
Glucose-Capillary: 212 mg/dL — ABNORMAL HIGH (ref 70–99)
Glucose-Capillary: 144 mg/dL — ABNORMAL HIGH (ref 70–99)

## 2013-06-23 LAB — PROCALCITONIN: Procalcitonin: <0.1 ng/mL

## 2013-06-23 LAB — URINALYSIS, ROUTINE W REFLEX MICROSCOPIC
Bilirubin Urine: NEGATIVE
Glucose, UA: NEGATIVE mg/dL
Ketones, ur: NEGATIVE mg/dL
Leukocytes, UA: NEGATIVE
Nitrite: NEGATIVE
PH: 6.5 (ref 5.0–8.0)
Protein, ur: 30 mg/dL — AB
SPECIFIC GRAVITY, URINE: 1.02 (ref 1.005–1.030)
Urobilinogen, UA: 0.2 mg/dL (ref 0.0–1.0)

## 2013-06-23 LAB — RAPID URINE DRUG SCREEN, HOSP PERFORMED
Amphetamines: NOT DETECTED
BENZODIAZEPINES: NOT DETECTED
Barbiturates: NOT DETECTED
COCAINE: NOT DETECTED
OPIATES: NOT DETECTED
TETRAHYDROCANNABINOL: NOT DETECTED

## 2013-06-23 LAB — MRSA PCR SCREENING: MRSA by PCR: NEGATIVE

## 2013-06-23 LAB — URINE MICROSCOPIC-ADD ON

## 2013-06-23 LAB — AMMONIA: Ammonia: 33 umol/L (ref 11–60)

## 2013-06-23 LAB — TRIGLYCERIDES: TRIGLYCERIDES: 82 mg/dL (ref <150)

## 2013-06-23 LAB — TROPONIN I: Troponin I: <0.3 ng/mL (ref <0.30)

## 2013-06-23 LAB — PRO B NATRIURETIC PEPTIDE: Pro B Natriuretic peptide (BNP): 2103 pg/mL — ABNORMAL HIGH (ref 0–125)

## 2013-06-23 LAB — LACTIC ACID, PLASMA: Lactic Acid, Venous: 0.9 mmol/L (ref 0.5–2.2)

## 2013-06-23 MED ORDER — ASPIRIN 81 MG PO CHEW
324.0000 mg | CHEWABLE_TABLET | ORAL | Status: AC
  Administered 2013-06-23: 324 mg via ORAL
  Filled 2013-06-23: qty 4

## 2013-06-23 MED ORDER — MIDAZOLAM HCL 2 MG/2ML IJ SOLN: 2.0000 mg | INTRAMUSCULAR | Status: DC | PRN

## 2013-06-23 MED ORDER — BUPROPION HCL 100 MG PO TABS
100.0000 mg | ORAL_TABLET | Freq: Two times a day (BID) | ORAL | Status: DC
  Administered 2013-06-24 – 2013-06-28 (×9): 100 mg via ORAL
  Filled 2013-06-23 (×14): qty 1

## 2013-06-23 MED ORDER — HEPARIN SODIUM (PORCINE) 5000 UNIT/ML IJ SOLN
5000.0000 [IU] | Freq: Three times a day (TID) | INTRAMUSCULAR | Status: DC
  Administered 2013-06-23 – 2013-06-28 (×15): 5000 [IU] via SUBCUTANEOUS
  Filled 2013-06-23 (×15): qty 1

## 2013-06-23 MED ORDER — CHLORHEXIDINE GLUCONATE 0.12 % MT SOLN
15.0000 mL | Freq: Two times a day (BID) | OROMUCOSAL | Status: DC
  Administered 2013-06-23 – 2013-06-28 (×10): 15 mL via OROMUCOSAL
  Filled 2013-06-23 (×10): qty 15

## 2013-06-23 MED ORDER — PIPERACILLIN-TAZOBACTAM 3.375 G IVPB 30 MIN
3.3750 g | Freq: Once | INTRAVENOUS | Status: AC
  Administered 2013-06-23: 3.375 g via INTRAVENOUS
  Filled 2013-06-23: qty 50

## 2013-06-23 MED ORDER — CARVEDILOL 3.125 MG PO TABS
6.2500 mg | ORAL_TABLET | Freq: Two times a day (BID) | ORAL | Status: DC
  Administered 2013-06-24 – 2013-06-28 (×7): 6.25 mg via ORAL
  Filled 2013-06-23 (×8): qty 2

## 2013-06-23 MED ORDER — INSULIN DETEMIR 100 UNIT/ML ~~LOC~~ SOLN
15.0000 [IU] | Freq: Every day | SUBCUTANEOUS | Status: DC
  Administered 2013-06-23 – 2013-06-27 (×5): 15 [IU] via SUBCUTANEOUS
  Filled 2013-06-23 (×6): qty 0.15

## 2013-06-23 MED ORDER — BACLOFEN 10 MG PO TABS
ORAL_TABLET | ORAL | Status: AC
  Filled 2013-06-23: qty 1

## 2013-06-23 MED ORDER — INSULIN ASPART 100 UNIT/ML ~~LOC~~ SOLN
0.0000 [IU] | SUBCUTANEOUS | Status: DC
  Administered 2013-06-23 (×2): 3 [IU] via SUBCUTANEOUS
  Administered 2013-06-24 – 2013-06-25 (×3): 1 [IU] via SUBCUTANEOUS
  Administered 2013-06-26 (×3): 2 [IU] via SUBCUTANEOUS
  Administered 2013-06-26: 1 [IU] via SUBCUTANEOUS
  Administered 2013-06-26: 2 [IU] via SUBCUTANEOUS

## 2013-06-23 MED ORDER — SODIUM CHLORIDE 0.9 % IV SOLN
1000.0000 mL | Freq: Once | INTRAVENOUS | Status: AC
  Administered 2013-06-23: 1000 mL via INTRAVENOUS

## 2013-06-23 MED ORDER — FENTANYL CITRATE 0.05 MG/ML IJ SOLN: 150.0000 ug | Freq: Once | INTRAMUSCULAR | Status: DC

## 2013-06-23 MED ORDER — SUCCINYLCHOLINE CHLORIDE 20 MG/ML IJ SOLN
INTRAMUSCULAR | Status: AC | PRN
  Administered 2013-06-23: 100 mg via INTRAVENOUS

## 2013-06-23 MED ORDER — ASPIRIN 300 MG RE SUPP: 300.0000 mg | RECTAL | Status: AC

## 2013-06-23 MED ORDER — PROPOFOL 10 MG/ML IV EMUL
Freq: Once | INTRAVENOUS | Status: AC
  Administered 2013-06-23: 1000 mg via INTRAVENOUS

## 2013-06-23 MED ORDER — METOLAZONE 5 MG PO TABS: 2.5000 mg | ORAL_TABLET | Freq: Every day | ORAL | Status: DC | PRN

## 2013-06-23 MED ORDER — DEXTROSE 5 % IV SOLN
2.0000 g | INTRAVENOUS | Status: DC
  Filled 2013-06-23 (×3): qty 2

## 2013-06-23 MED ORDER — FLUOXETINE HCL 20 MG PO CAPS
80.0000 mg | ORAL_CAPSULE | Freq: Every day | ORAL | Status: DC
  Administered 2013-06-24 – 2013-06-28 (×4): 80 mg via ORAL
  Filled 2013-06-23 (×4): qty 4

## 2013-06-23 MED ORDER — ETOMIDATE 2 MG/ML IV SOLN
INTRAVENOUS | Status: AC | PRN
  Administered 2013-06-23: 30 mg via INTRAVENOUS

## 2013-06-23 MED ORDER — SODIUM CHLORIDE 0.9 % IV SOLN
1000.0000 mL | INTRAVENOUS | Status: DC
  Administered 2013-06-23: 1000 mL via INTRAVENOUS

## 2013-06-23 MED ORDER — NALOXONE HCL 1 MG/ML IJ SOLN
1.0000 mg | Freq: Once | INTRAMUSCULAR | Status: AC
  Administered 2013-06-23: 1 mg via INTRAVENOUS
  Filled 2013-06-23: qty 2

## 2013-06-23 MED ORDER — PROPOFOL 10 MG/ML IV EMUL
INTRAVENOUS | Status: AC
  Administered 2013-06-23: 1000 mg via INTRAVENOUS
  Filled 2013-06-23: qty 100

## 2013-06-23 MED ORDER — ROCURONIUM BROMIDE 50 MG/5ML IV SOLN
INTRAVENOUS | Status: AC
  Filled 2013-06-23: qty 2

## 2013-06-23 MED ORDER — DEXTROSE 5 % IV SOLN
1.0000 g | Freq: Three times a day (TID) | INTRAVENOUS | Status: DC
  Administered 2013-06-23: 1 g via INTRAVENOUS
  Filled 2013-06-23 (×9): qty 1

## 2013-06-23 MED ORDER — VANCOMYCIN HCL 10 G IV SOLR
1500.0000 mg | Freq: Once | INTRAVENOUS | Status: AC
  Administered 2013-06-23: 1500 mg via INTRAVENOUS
  Filled 2013-06-23: qty 1500

## 2013-06-23 MED ORDER — SODIUM CHLORIDE 0.9 % IV SOLN
250.0000 mL | INTRAVENOUS | Status: DC | PRN
  Administered 2013-06-23: 18:00:00 via INTRAVENOUS
  Administered 2013-06-24: 250 mL via INTRAVENOUS

## 2013-06-23 MED ORDER — DEXTROSE 5 % IV SOLN
INTRAVENOUS | Status: AC
  Filled 2013-06-23: qty 1

## 2013-06-23 MED ORDER — FUROSEMIDE 10 MG/ML IJ SOLN
80.0000 mg | Freq: Three times a day (TID) | INTRAMUSCULAR | Status: DC
  Administered 2013-06-23 – 2013-06-25 (×7): 80 mg via INTRAVENOUS
  Filled 2013-06-23 (×7): qty 8

## 2013-06-23 MED ORDER — CLONAZEPAM 0.5 MG PO TABS: 0.5000 mg | ORAL_TABLET | Freq: Two times a day (BID) | ORAL | Status: DC | PRN

## 2013-06-23 MED ORDER — PANTOPRAZOLE SODIUM 40 MG IV SOLR
40.0000 mg | Freq: Every day | INTRAVENOUS | Status: DC
  Administered 2013-06-23 – 2013-06-26 (×4): 40 mg via INTRAVENOUS
  Filled 2013-06-23 (×4): qty 40

## 2013-06-23 MED ORDER — SUCCINYLCHOLINE CHLORIDE 20 MG/ML IJ SOLN
INTRAMUSCULAR | Status: AC
  Filled 2013-06-23: qty 1

## 2013-06-23 MED ORDER — FENTANYL CITRATE 0.05 MG/ML IJ SOLN: 100.0000 ug | INTRAMUSCULAR | Status: DC | PRN

## 2013-06-23 MED ORDER — FUROSEMIDE 10 MG/ML IJ SOLN
80.0000 mg | Freq: Once | INTRAMUSCULAR | Status: AC
  Administered 2013-06-23: 80 mg via INTRAVENOUS
  Filled 2013-06-23 (×2): qty 8

## 2013-06-23 MED ORDER — PROPOFOL 10 MG/ML IV EMUL
0.0000 ug/kg/min | INTRAVENOUS | Status: DC
  Administered 2013-06-23: 15 ug/kg/min via INTRAVENOUS
  Administered 2013-06-24: 12.531 ug/kg/min via INTRAVENOUS
  Administered 2013-06-24: 8 ug/kg/min via INTRAVENOUS
  Administered 2013-06-25: 15 ug/kg/min via INTRAVENOUS
  Administered 2013-06-25: 20 ug/kg/min via INTRAVENOUS
  Administered 2013-06-26: 10 ug/kg/min via INTRAVENOUS
  Filled 2013-06-23 (×7): qty 100

## 2013-06-23 MED ORDER — ETOMIDATE 2 MG/ML IV SOLN
INTRAVENOUS | Status: AC
Start: 2013-06-23 — End: 2013-06-24
  Filled 2013-06-23: qty 20

## 2013-06-23 MED ORDER — BACLOFEN 5 MG HALF TABLET
5.0000 mg | ORAL_TABLET | Freq: Four times a day (QID) | ORAL | Status: DC
  Administered 2013-06-23 – 2013-06-25 (×8): 5 mg via ORAL
  Filled 2013-06-23 (×12): qty 1

## 2013-06-23 MED ORDER — LIDOCAINE HCL (CARDIAC) 20 MG/ML IV SOLN
INTRAVENOUS | Status: AC
Start: 2013-06-23 — End: 2013-06-23
  Filled 2013-06-23: qty 5

## 2013-06-23 MED ORDER — BIOTENE DRY MOUTH MT LIQD
15.0000 mL | Freq: Four times a day (QID) | OROMUCOSAL | Status: DC
  Administered 2013-06-24 – 2013-06-28 (×18): 15 mL via OROMUCOSAL

## 2013-06-23 NOTE — ED Notes (Signed)
Pt responded to Narcan, more alert, EDP made aware, lab questioned about negative results on UDS.

## 2013-06-23 NOTE — Sedation Documentation (Signed)
68f with 26 at lip intubation with color change. Chest xray ordered. Chest equal rising.

## 2013-06-23 NOTE — Sedation Documentation (Signed)
edp attempting to intubate at this time.

## 2013-06-23 NOTE — H&P (Signed)
[facility] History and Physical  [name] BCW:[id number] DOB: [birth year] DOA: 06/23/2013  Referring physician: Dr. Venora Maples PCP: Annia Belt, MD   Chief Complaint: Confusion  HPI: [name] is a 58 y.o. male  Of systolic heart failure with an EF of 20%, diabetes type 2 insulin-dependent with a hemoglobin A1c of 6.8, chronic back pain currently on narcotics was brought to the emergency department by family  for altered mental status.  Recently his pain medicine was increased and family reported that his been more confused and more difficult to arouse her the past several days. On arrival the emergency room and the patient was found to have 2 fentanyl patches on. These were removed.   In the ED: He then develop hypoxia on 4 L O2 sat < 80% and in respiratory distress.BNP > 2000. Lactic acid 0.9 and a WBC 20K Cr at baseline.   Review of Systems:  10 point review of systems done pertinent positive per history of present illness  Past Medical History  Diagnosis Date  . Diabetes mellitus   . Hypertension   . CHF (congestive heart failure)   . CRD (chronic renal disease)   . Arthritis   . Obesity   . Depression   . Sleep apnea   . Diabetic neuropathy   . CAD (coronary artery disease)   . Mitral regurgitation   . Esophagitis, reflux 07/07/10    MW tear, erosive reflux esophagitis, 2cm hh  . S/P colonoscopy April 2012    Rourk: torturous colon, hyperplastic polyps  . Collagen vascular disease     "myoclonic "  . Tremor   . Diverticulum   . Hiatal hernia   . GERD (gastroesophageal reflux disease)     erosive  . Gastroparesis 11/2010    No emptying at two hours.   . Chronic back pain   . Clonic seizure     controlled with klonodine   Past Surgical History  Procedure Laterality Date  . Neck surgery      discectomy  . Hand surgery      right-carpal tunnel  . Colonoscopy  before 2002    [place]  . Appendectomy    . Other surgical history      bone graft-knee    . Anterior lumbar corpectomy w/ fusion      C5-6/C4-7  . Colonoscopy  08/21/2010    elongaed tortuous colon otherwise normal  . Esophagogastroduodenoscopy  07/08/10    Dr. Volney American esophageal erosions and excoriations consistent with erosive reflux esophagitis, hiatal hernia  . Knee arthroscopy      right  . Esophagogastroduodenoscopy (egd) with propofol  02/25/2012    Procedure: ESOPHAGOGASTRODUODENOSCOPY (EGD) WITH PROPOFOL;  Surgeon: Daneil Dolin, MD;  Location: [facility];  Service: Endoscopy;  Laterality: N/A;  7:30 /POLYPHARMACY   Social History:  reports that he has been smoking Cigarettes.  He has a 20 pack-year smoking history. He has never used smokeless tobacco. He reports that he uses illicit drugs (Marijuana). He reports that he does not drink alcohol.  Allergies  Allergen Reactions  . Ms Contin [Morphine Sulfate Er]     Doesn't like to take in liquid form( burns and doesn't help) (pill form works fine)     Family History  Problem Relation Age of Onset  . Stroke Father 90  . Stroke Mother 52  . Colon cancer Neg Hx   . Crohn's disease Neg Hx   . Cirrhosis Neg Hx   . Ulcerative colitis  Neg Hx   . Stomach cancer Neg Hx      Prior to Admission medications   Medication Sig Start Date End Date Taking? Authorizing Provider  atorvastatin (LIPITOR) 20 MG tablet Take 20 mg by mouth daily.   Yes Historical Provider, MD  baclofen (LIORESAL) 10 MG tablet Take 5 mg by mouth 4 (four) times daily. 06/19/13 07/19/13 Yes Historical Provider, MD  buPROPion (WELLBUTRIN) 100 MG tablet Take 100 mg by mouth 2 (two) times daily.   Yes Historical Provider, MD  carvedilol (COREG) 6.25 MG tablet Take 1 tablet (6.25 mg total) by mouth 2 (two) times daily with a meal. 06/04/13  Yes Samuella Cota, MD  clonazePAM (KLONOPIN) 0.5 MG tablet Take 0.5-1 mg by mouth 2 (two) times daily as needed for anxiety (1 in the morning and 2 at bedtime).   Yes Historical Provider, MD  fentaNYL (DURAGESIC - DOSED  MCG/HR) 100 MCG/HR Place 100 mcg onto the skin every 3 (three) days.   Yes Historical Provider, MD  FLUoxetine (PROZAC) 20 MG capsule Take 80 mg by mouth daily.   Yes Historical Provider, MD  furosemide (LASIX) 40 MG tablet Take 1 tablet (40 mg total) by mouth 2 (two) times daily. 06/04/13  Yes Samuella Cota, MD  gabapentin (NEURONTIN) 100 MG capsule Take 300 mg by mouth 3 (three) times daily.   Yes Historical Provider, MD  hydrALAZINE (APRESOLINE) 10 MG tablet Take 1 tablet (10 mg total) by mouth every 8 (eight) hours. 06/04/13  Yes Samuella Cota, MD  HYDROmorphone (DILAUDID) 4 MG tablet Take 4 mg by mouth every 6 (six) hours as needed. 06/19/13 07/19/13 Yes Historical Provider, MD  insulin aspart protamine-insulin aspart (NOVOLOG 70/30) (70-30) 100 UNIT/ML injection Inject 10-20 Units into the skin 2 (two) times daily with a meal. Units vary based on food consumed.   Yes Historical Provider, MD  isosorbide mononitrate (IMDUR) 30 MG 24 hr tablet Take 15 mg by mouth daily.   Yes Historical Provider, MD  lisinopril (PRINIVIL,ZESTRIL) 5 MG tablet Take 1 tablet (5 mg total) by mouth daily. 06/04/13  Yes Samuella Cota, MD  metolazone (ZAROXOLYN) 2.5 MG tablet Take 1 tablet (2.5 mg total) by mouth daily as needed (for increased swelling. After taking a dose, call you cardiologist for further instructions.). 06/04/13  Yes Samuella Cota, MD  modafinil (PROVIGIL) 200 MG tablet Take 100 mg by mouth 2 (two) times daily.   Yes Historical Provider, MD  Multiple Vitamins-Minerals (CENTRUM SILVER ULTRA MENS) TABS Take 1 tablet by mouth daily.     Yes Historical Provider, MD  NITROSTAT 0.4 MG SL tablet Place 1 tablet under the tongue every 5 (five) minutes as needed for chest pain.  05/11/13  Yes Historical Provider, MD  ondansetron (ZOFRAN-ODT) 4 MG disintegrating tablet Take 1 tablet (4 mg total) by mouth every 4 (four) hours as needed for nausea. 01/27/12  Yes Mahala Menghini, PA-C  oxyCODONE-acetaminophen  (PERCOCET/ROXICET) 5-325 MG per tablet Take 1 tablet by mouth every 8 (eight) hours as needed for severe pain. 06/04/13  Yes Samuella Cota, MD  pantoprazole (PROTONIX) 40 MG tablet Take 40 mg by mouth daily.   Yes Historical Provider, MD  polyethylene glycol (MIRALAX / GLYCOLAX) packet Take 17 g by mouth daily as needed. For constipation   Yes Historical Provider, MD  rOPINIRole (REQUIP) 3 MG tablet Take 6 mg by mouth at bedtime.   Yes Historical Provider, MD  senna (SENOKOT) 8.6 MG TABS tablet  Take 2 tablets by mouth 2 (two) times daily.   Yes Historical Provider, MD   Physical Exam: Filed Vitals:   06/23/13 1600  BP: 191/99  Pulse: 97  Temp:   Resp: 12    BP 191/99  Pulse 97  Temp(Src) 98.6 F (37 C) (Rectal)  Resp 12  SpO2 90%  General:  Sedated Eyes: PERRL, ENT: grossly normal hearing, lips & tongue Neck: no LAD, masses or thyromegaly + JVD Cardiovascular: RRR, no m/r/g. No LE edema. Telemetry: SR, no arrhythmias  Respiratory: CTA bilaterally, no w/r/r. Normal respiratory effort. Abdomen: soft, ntnd Skin: no rash or induration seen on limited exam Musculoskeletal: grossly normal tone BUE/BLE           Labs on Admission:  Basic Metabolic Panel:  Recent Labs Lab 06/23/13 1301  NA 146  K 3.9  CL 103  CO2 30  GLUCOSE 228*  BUN 39*  CREATININE 2.49*  CALCIUM 9.3   Liver Function Tests: No results found for this basename: AST, ALT, ALKPHOS, BILITOT, PROT, ALBUMIN,  in the last 168 hours No results found for this basename: LIPASE, AMYLASE,  in the last 168 hours  Recent Labs Lab 06/23/13 1302  AMMONIA 33   CBC:  Recent Labs Lab 06/23/13 1301  WBC 20.6*  HGB 13.1  HCT 40.3  MCV 88.6  PLT 232   Cardiac Enzymes:  Recent Labs Lab 06/23/13 1438  TROPONINI <0.30    BNP (last 3 results)  Recent Labs  05/30/13 2025  PROBNP 9807.0*   CBG: No results found for this basename: GLUCAP,  in the last 168 hours  Radiological Exams on  Admission: Dg Chest 1 View  06/23/2013   CLINICAL DATA:  Altered mental status.  EXAM: CHEST - 1 VIEW  COMPARISON:  PA and lateral chest 05/06/2012 and 05/30/2013.  FINDINGS: There is cardiomegaly. Alveolar and interstitial opacities are worse on the right with more focal opacity seen in the right upper and lower lung zones. No pneumothorax or pleural fluid.  IMPRESSION: Cardiomegaly and pulmonary edema. More focal airspace opacity in the right upper and lower lung zones likely represents foci of more intense edema rather than pneumonia.   Electronically Signed   By: Inge Rise M.D.   On: 06/23/2013 15:20   Ct Head Wo Contrast  06/23/2013   CLINICAL DATA:  Altered level of consciousness.  EXAM: CT HEAD WITHOUT CONTRAST  TECHNIQUE: Contiguous axial images were obtained from the base of the skull through the vertex without intravenous contrast.  COMPARISON:  MR HEAD W/O CM dated 12/09/2012; CT HEAD W/O CM dated 12/08/2012  FINDINGS: Image quality is degraded by motion. No evidence of an acute infarct, acute hemorrhage, mass lesion, mass effect or hydrocephalus. Mild atrophy. Visualized portions of the paranasal sinuses and mastoid air cells are clear.  IMPRESSION: 1. Image quality is degraded by motion. 2. No definite acute intracranial abnormality. 3. Mild atrophy.   Electronically Signed   By: Lorin Picket M.D.   On: 06/23/2013 14:07   Dg Chest Port 1v Same Day  06/23/2013   CLINICAL DATA:  Intubation  EXAM: PORTABLE CHEST - 1 VIEW SAME DAY  COMPARISON:  Portable exam 1609 hr compared earlier study of 15/11 hr  FINDINGS: Endotracheal tube tip projects 5.2 cm above carinal.  Nasogastric tube extends into stomach.  Enlargement of cardiac silhouette with pulmonary vascular congestion.  Asymmetric infiltrate throughout right lung versus left may represent asymmetric edema or infection.  No pleural effusion or pneumothorax.  Prior cervical spine fusion.  Bones otherwise unremarkable.  IMPRESSION:  Endotracheal nasogastric tube positions as above.  Enlargement of cardiac silhouette with pulmonary vascular congestion and asymmetric pulmonary infiltrates right greater than left question asymmetric edema versus infection.   Electronically Signed   By: Lavonia Dana M.D.   On: 06/23/2013 16:22    EKG: Independently reviewed. Pending  Assessment/Plan Acute respiratory failure with hypoxia due to  Acute systolic heart failure andor probable HCAP vs narcotics overdose: - Patient was intubated in the emergency room, lactic acid was done of less than 1, he was having saturating than 80% on 4 L of oxygen and in respiratory distress. Chest x-ray shows asymmetrical infiltrative pattern with prominence in the right lobe greater than upper lobe, Before intubation. I agree with starting him on IV Lasix continue metolazone. Increase his Lasix to 3 times a day. We'll get blood cultures and sputum cultures. We'll start empiric antibiotic vancomycin and cefepime for probable healthcare associated pneumonia. Check a pro-calcitonin and cortisol level. - Check an HIV urine Legionella blood cultures and sputum cultures. - Check a 2-D echo. - Discontinue fentany patch, we'll switch him to fentanyl and Versed. D/ propofol. - Consult pulmonary for vent. Management. Check an ABG tomorrow morning. - Place a Foley catheter and monitor strict I.'s and O's. - We'll discontinue antihypertensive medication except his Coreg. His blood pressure seems currently to be stable, his lactic acid is less than 1 which makes sepsis unlikely.  CKD (chronic kidney disease), stage III - Cr at baseline. - Hold ACE inhibitor.  DM type 2 with diabetic peripheral neuropathy - d/c 70/30 Pt is NPO. start low dose levemir and SSI.  Acute encephalopathy - Unclear etiology, it could be multifactorial due to the hypoxia and infectious etiology. He did have a white count 20 K. Also contributing to his encephalopathy could be narcotics. At this  time we'll get blood cultures and sputum cultures UA showed 2-2 white blood cells, his urine tox screen was negative. CT scan of the head showed no acute intracranial abnormalities. DC his fentanyl patch.    Code Status: full Family Communication: none Disposition Plan: INpatinet  Time spent: 35 minutes  Charlynne Cousins [facility] Pager [phone]

## 2013-06-23 NOTE — ED Notes (Addendum)
AMS. Responding minimally to painful stimuli Per EMS, Pt has chronic back pain and dilaudid was recently increased. Fentanyl 147mcg  patch present to both arms on arrival

## 2013-06-23 NOTE — ED Provider Notes (Signed)
CSN: [id number]     Arrival date & time 06/23/13  1226 History   First MD Initiated Contact with Patient 06/23/13 1238     Chief Complaint  Patient presents with  . Altered Mental Status      Patient is a 58 y.o. male presenting with altered mental status.  Altered Mental Status  patient was brought to the emergency department for altered mental status.  As reported that the patient has a history of chronic back pain as well as congestive heart failure.  Recently his pain medicine was increased and family reported that his been more confused and more difficult to arouse her the past several days.  On arrival the emergency room and the patient was found to have 2 fentanyl patches on.  These were removed.  The patient continued to breathe okay so Narcan was not given.  EMS did not give him Narcan either.  No report of cough.  No additional history is available at this time.  No reported fevers.  Decreased oral intake.  Past Medical History  Diagnosis Date  . Diabetes mellitus   . Hypertension   . CHF (congestive heart failure)   . CRD (chronic renal disease)   . Arthritis   . Obesity   . Depression   . Sleep apnea   . Diabetic neuropathy   . CAD (coronary artery disease)   . Mitral regurgitation   . Esophagitis, reflux 07/07/10    MW tear, erosive reflux esophagitis, 2cm hh  . S/P colonoscopy April 2012    Rourk: torturous colon, hyperplastic polyps  . Collagen vascular disease     "myoclonic "  . Tremor   . Diverticulum   . Hiatal hernia   . GERD (gastroesophageal reflux disease)     erosive  . Gastroparesis 11/2010    No emptying at two hours.   . Chronic back pain   . Clonic seizure     controlled with klonodine   Past Surgical History  Procedure Laterality Date  . Neck surgery      discectomy  . Hand surgery      right-carpal tunnel  . Colonoscopy  before 2002    [place]  . Appendectomy    . Other surgical history      bone graft-knee  . Anterior lumbar corpectomy  w/ fusion      C5-6/C4-7  . Colonoscopy  08/21/2010    elongaed tortuous colon otherwise normal  . Esophagogastroduodenoscopy  07/08/10    Dr. Volney American esophageal erosions and excoriations consistent with erosive reflux esophagitis, hiatal hernia  . Knee arthroscopy      right  . Esophagogastroduodenoscopy (egd) with propofol  02/25/2012    Procedure: ESOPHAGOGASTRODUODENOSCOPY (EGD) WITH PROPOFOL;  Surgeon: Daneil Dolin, MD;  Location: [facility];  Service: Endoscopy;  Laterality: N/A;  7:30 /POLYPHARMACY   Family History  Problem Relation Age of Onset  . Stroke Father 36  . Stroke Mother 77  . Colon cancer Neg Hx   . Crohn's disease Neg Hx   . Cirrhosis Neg Hx   . Ulcerative colitis Neg Hx   . Stomach cancer Neg Hx    History  Substance Use Topics  . Smoking status: Current Every Day Smoker -- 0.50 packs/day for 40 years    Types: Cigarettes  . Smokeless tobacco: Never Used     Comment: ordered nicotine patches. start date is 27 July 2010  . Alcohol Use: No    Review of Systems  Unable to  perform ROS: Mental status change      Allergies  Ms contin  Home Medications   Current Outpatient Rx  Name  Route  Sig  Dispense  Refill  . atorvastatin (LIPITOR) 20 MG tablet   Oral   Take 20 mg by mouth daily.         . baclofen (LIORESAL) 10 MG tablet   Oral   Take 5 mg by mouth 4 (four) times daily.         Marland Kitchen buPROPion (WELLBUTRIN) 100 MG tablet   Oral   Take 100 mg by mouth 2 (two) times daily.         . carvedilol (COREG) 6.25 MG tablet   Oral   Take 1 tablet (6.25 mg total) by mouth 2 (two) times daily with a meal.   60 tablet   0   . clonazePAM (KLONOPIN) 0.5 MG tablet   Oral   Take 0.5-1 mg by mouth 2 (two) times daily as needed for anxiety (1 in the morning and 2 at bedtime).         . fentaNYL (DURAGESIC - DOSED MCG/HR) 100 MCG/HR   Transdermal   Place 100 mcg onto the skin every 3 (three) days.         Marland Kitchen FLUoxetine (PROZAC) 20 MG  capsule   Oral   Take 80 mg by mouth daily.         . furosemide (LASIX) 40 MG tablet   Oral   Take 1 tablet (40 mg total) by mouth 2 (two) times daily.         Marland Kitchen gabapentin (NEURONTIN) 100 MG capsule   Oral   Take 300 mg by mouth 3 (three) times daily.         . hydrALAZINE (APRESOLINE) 10 MG tablet   Oral   Take 1 tablet (10 mg total) by mouth every 8 (eight) hours.   90 tablet   0   . HYDROmorphone (DILAUDID) 4 MG tablet   Oral   Take 4 mg by mouth every 6 (six) hours as needed.         . insulin aspart protamine-insulin aspart (NOVOLOG 70/30) (70-30) 100 UNIT/ML injection   Subcutaneous   Inject 10-20 Units into the skin 2 (two) times daily with a meal. Units vary based on food consumed.         . isosorbide mononitrate (IMDUR) 30 MG 24 hr tablet   Oral   Take 15 mg by mouth daily.         Marland Kitchen lisinopril (PRINIVIL,ZESTRIL) 5 MG tablet   Oral   Take 1 tablet (5 mg total) by mouth daily.   30 tablet   0   . metolazone (ZAROXOLYN) 2.5 MG tablet   Oral   Take 1 tablet (2.5 mg total) by mouth daily as needed (for increased swelling. After taking a dose, call you cardiologist for further instructions.).   30 tablet   0   . modafinil (PROVIGIL) 200 MG tablet   Oral   Take 100 mg by mouth 2 (two) times daily.         . Multiple Vitamins-Minerals (CENTRUM SILVER ULTRA MENS) TABS   Oral   Take 1 tablet by mouth daily.           Marland Kitchen NITROSTAT 0.4 MG SL tablet   Sublingual   Place 1 tablet under the tongue every 5 (five) minutes as needed for chest pain.          Marland Kitchen  ondansetron (ZOFRAN-ODT) 4 MG disintegrating tablet   Oral   Take 1 tablet (4 mg total) by mouth every 4 (four) hours as needed for nausea.   20 tablet   0   . oxyCODONE-acetaminophen (PERCOCET/ROXICET) 5-325 MG per tablet   Oral   Take 1 tablet by mouth every 8 (eight) hours as needed for severe pain.         . pantoprazole (PROTONIX) 40 MG tablet   Oral   Take 40 mg by mouth  daily.         . polyethylene glycol (MIRALAX / GLYCOLAX) packet   Oral   Take 17 g by mouth daily as needed. For constipation         . rOPINIRole (REQUIP) 3 MG tablet   Oral   Take 6 mg by mouth at bedtime.         . senna (SENOKOT) 8.6 MG TABS tablet   Oral   Take 2 tablets by mouth 2 (two) times daily.          BP 191/99  Pulse 97  Temp(Src) 98.6 F (37 C) (Rectal)  Resp 12  SpO2 90% Physical Exam  Nursing note and vitals reviewed. Constitutional: He appears well-developed and well-nourished.  HENT:  Head: Normocephalic and atraumatic.  Dry mucous membranes  Eyes: EOM are normal.  Neck: Normal range of motion.  Cardiovascular: Regular rhythm, normal heart sounds and intact distal pulses.   Mild tachycardia  Pulmonary/Chest: Effort normal and breath sounds normal. No respiratory distress.  Abdominal: Soft. He exhibits no distension. There is no tenderness.  Musculoskeletal: Normal range of motion.  Neurological:  Awakens to sternal rub and follows only very simple commands.  Skin: Skin is warm and dry.  Psychiatric: He has a normal mood and affect. Judgment normal.    ED Course  Procedures (including critical care time)  INTUBATION Performed by: Hoy Morn Required items: required blood products, implants, devices, and special equipment available Patient identity confirmed: provided demographic data and hospital-assigned identification number Time out: Immediately prior to procedure a "time out" was called to verify the correct patient, procedure, equipment, support staff and site/side marked as required. Indications: respiratory failure, hypoxia Intubation method: Glidescope Laryngoscopy  Preoxygenation: BVM Sedatives: Etomidate paralytic: Succinylcholine Tube Size: 8.0 cuffed Post-procedure assessment: chest rise and ETCO2 monitor Breath sounds: equal and absent over the epigastrium Tube secured with: ETT holder Chest x-ray interpreted by  radiologist and me. Chest x-ray findings: endotracheal tube in appropriate position Patient tolerated the procedure well with no immediate complications.  CRITICAL CARE Performed by: Hoy Morn Total critical care time: 35 Critical care time was exclusive of separately billable procedures and treating other patients. Critical care was necessary to treat or prevent imminent or life-threatening deterioration. Critical care was time spent personally by me on the following activities: development of treatment plan with patient and/or surrogate as well as nursing, discussions with consultants, evaluation of patient's response to treatment, examination of patient, obtaining history from patient or surrogate, ordering and performing treatments and interventions, ordering and review of laboratory studies, ordering and review of radiographic studies, pulse oximetry and re-evaluation of patient's condition.      Labs Review Labs Reviewed  CBC - Abnormal; Notable for the following:    WBC 20.6 (*)    All other components within normal limits  BASIC METABOLIC PANEL - Abnormal; Notable for the following:    Glucose, Bld 228 (*)    BUN 39 (*)    Creatinine,  Ser 2.49 (*)    GFR calc non Af Amer 27 (*)    GFR calc Af Amer 31 (*)    All other components within normal limits  URINALYSIS, ROUTINE W REFLEX MICROSCOPIC - Abnormal; Notable for the following:    Hgb urine dipstick TRACE (*)    Protein, ur 30 (*)    All other components within normal limits  PRO B NATRIURETIC PEPTIDE - Abnormal; Notable for the following:    Pro B Natriuretic peptide (BNP) 2103.0 (*)    All other components within normal limits  CULTURE, BLOOD (ROUTINE X 2)  CULTURE, BLOOD (ROUTINE X 2)  AMMONIA  URINE RAPID DRUG SCREEN (HOSP PERFORMED)  TROPONIN I  URINE MICROSCOPIC-ADD ON  LACTIC ACID, PLASMA  BLOOD GAS, ARTERIAL   Imaging Review Dg Chest 1 View  06/23/2013   CLINICAL DATA:  Altered mental status.  EXAM:  CHEST - 1 VIEW  COMPARISON:  PA and lateral chest 05/06/2012 and 05/30/2013.  FINDINGS: There is cardiomegaly. Alveolar and interstitial opacities are worse on the right with more focal opacity seen in the right upper and lower lung zones. No pneumothorax or pleural fluid.  IMPRESSION: Cardiomegaly and pulmonary edema. More focal airspace opacity in the right upper and lower lung zones likely represents foci of more intense edema rather than pneumonia.   Electronically Signed   By: Drusilla Kanner M.D.   On: 06/23/2013 15:20   Ct Head Wo Contrast  06/23/2013   CLINICAL DATA:  Altered level of consciousness.  EXAM: CT HEAD WITHOUT CONTRAST  TECHNIQUE: Contiguous axial images were obtained from the base of the skull through the vertex without intravenous contrast.  COMPARISON:  MR HEAD W/O CM dated 12/09/2012; CT HEAD W/O CM dated 12/08/2012  FINDINGS: Image quality is degraded by motion. No evidence of an acute infarct, acute hemorrhage, mass lesion, mass effect or hydrocephalus. Mild atrophy. Visualized portions of the paranasal sinuses and mastoid air cells are clear.  IMPRESSION: 1. Image quality is degraded by motion. 2. No definite acute intracranial abnormality. 3. Mild atrophy.   Electronically Signed   By: Leanna Battles M.D.   On: 06/23/2013 14:07   Dg Chest Port 1v Same Day  06/23/2013   CLINICAL DATA:  Intubation  EXAM: PORTABLE CHEST - 1 VIEW SAME DAY  COMPARISON:  Portable exam 1609 hr compared earlier study of 15/11 hr  FINDINGS: Endotracheal tube tip projects 5.2 cm above carinal.  Nasogastric tube extends into stomach.  Enlargement of cardiac silhouette with pulmonary vascular congestion.  Asymmetric infiltrate throughout right lung versus left may represent asymmetric edema or infection.  No pleural effusion or pneumothorax.  Prior cervical spine fusion.  Bones otherwise unremarkable.  IMPRESSION: Endotracheal nasogastric tube positions as above.  Enlargement of cardiac silhouette with  pulmonary vascular congestion and asymmetric pulmonary infiltrates right greater than left question asymmetric edema versus infection.   Electronically Signed   By: Ulyses Southward M.D.   On: 06/23/2013 16:22  I personally reviewed the imaging tests through PACS system I reviewed available ER/hospitalization records through the EMR   EKG Interpretation  Date/Time:  Friday June 23 2013 16:30:26 EST Ventricular Rate:  87 PR Interval:  150 QRS Duration: 114 QT Interval:  404 QTC Calculation: 486 R Axis:   -26 Text Interpretation:  Sinus rhythm with occasional Premature ventricular complexes Possible Left atrial enlargement Septal infarct (cited on or before 08-Dec-2012) Abnormal ECG When compared with ECG of 30-May-2013 20:28, No significant change was found Confirmed  by Venora Maples  MD, Lennette Bihari ([id number]) on 06/23/2013 4:35:28 PM         MDM   Final diagnoses:  Respiratory failure  HCAP (healthcare-associated pneumonia)  Pulmonary edema    4:32 PM Patient develop worsening altered mental status and developed hypoxia while in the emergency department.  He became extremely agitated and was difficult to control.  I was concerned about his O2 sats is in the dropped his oxygen saturation down to 74% on 4 L nasal cannula.  Patient is intubated for airway protection and respiratory failure.  Repeat chest x-ray demonstrates worsening edema versus infection of his right lung field.  IV Lasix given.  Patient given vancomycin and Zosyn for potential healthcare associated pneumonia.  Lactate pending.  BNP added.  As patient does have a history congestive heart failure.  Patient will be admitted to the intensive care unit here at any pain.  I discussed this case with the hospitalist.  Critical care will be brought on board for vent management.  Diuresis and antibiotics now.  Family updated.    Hoy Morn, MD 06/23/13 [phone]

## 2013-06-23 NOTE — Sedation Documentation (Signed)
Pt being preoxygentated.

## 2013-06-24 ENCOUNTER — Inpatient Hospital Stay (HOSPITAL_COMMUNITY): Payer: Medicare HMO

## 2013-06-24 DIAGNOSIS — N189 Chronic kidney disease, unspecified: Secondary | ICD-10-CM

## 2013-06-24 DIAGNOSIS — I517 Cardiomegaly: Secondary | ICD-10-CM

## 2013-06-24 DIAGNOSIS — N179 Acute kidney failure, unspecified: Secondary | ICD-10-CM

## 2013-06-24 LAB — BLOOD GAS, ARTERIAL
Acid-Base Excess: 4.3 mmol/L — ABNORMAL HIGH (ref 0.0–2.0)
Bicarbonate: 27.7 mEq/L — ABNORMAL HIGH (ref 20.0–24.0)
DRAWN BY: 317771
FIO2: 0.4 %
O2 Saturation: 92.9 %
PCO2 ART: 37.3 mmHg (ref 35.0–45.0)
PEEP: 5 cmH2O
PH ART: 7.484 — AB (ref 7.350–7.450)
Patient temperature: 37
RATE: 18 resp/min
TCO2: 24.7 mmol/L (ref 0–100)
VT: 600 mL
pO2, Arterial: 69.7 mmHg — ABNORMAL LOW (ref 80.0–100.0)

## 2013-06-24 LAB — CBC
HCT: 35 % — ABNORMAL LOW (ref 39.0–52.0)
Hemoglobin: 11.6 g/dL — ABNORMAL LOW (ref 13.0–17.0)
MCH: 29.4 pg (ref 26.0–34.0)
MCHC: 33.1 g/dL (ref 30.0–36.0)
MCV: 88.6 fL (ref 78.0–100.0)
PLATELETS: 206 10*3/uL (ref 150–400)
RBC: 3.95 MIL/uL — ABNORMAL LOW (ref 4.22–5.81)
RDW: 14.8 % (ref 11.5–15.5)
WBC: 13.5 10*3/uL — AB (ref 4.0–10.5)

## 2013-06-24 LAB — GLUCOSE, CAPILLARY
GLUCOSE-CAPILLARY: 83 mg/dL (ref 70–99)
Glucose-Capillary: 84 mg/dL (ref 70–99)
Glucose-Capillary: 88 mg/dL (ref 70–99)
Glucose-Capillary: 99 mg/dL (ref 70–99)
Glucose-Capillary: 119 mg/dL — ABNORMAL HIGH (ref 70–99)

## 2013-06-24 LAB — STREP PNEUMONIAE URINARY ANTIGEN: Strep Pneumo Urinary Antigen: NEGATIVE

## 2013-06-24 LAB — PRO B NATRIURETIC PEPTIDE: Pro B Natriuretic peptide (BNP): 9222 pg/mL — ABNORMAL HIGH (ref 0–125)

## 2013-06-24 LAB — BASIC METABOLIC PANEL
BUN: 32 mg/dL — ABNORMAL HIGH (ref 6–23)
CHLORIDE: 109 meq/L (ref 96–112)
CO2: 28 mEq/L (ref 19–32)
Calcium: 8.9 mg/dL (ref 8.4–10.5)
Creatinine, Ser: 2.29 mg/dL — ABNORMAL HIGH (ref 0.50–1.35)
GFR calc Af Amer: 34 mL/min — ABNORMAL LOW (ref >90)
GFR calc non Af Amer: 30 mL/min — ABNORMAL LOW (ref >90)
Glucose, Bld: 85 mg/dL (ref 70–99)
Potassium: 3.3 mEq/L — ABNORMAL LOW (ref 3.7–5.3)
Sodium: 148 mEq/L — ABNORMAL HIGH (ref 137–147)

## 2013-06-24 LAB — CORTISOL: Cortisol, Plasma: 36.6 ug/dL

## 2013-06-24 LAB — HIV ANTIBODY (ROUTINE TESTING W REFLEX): HIV: NONREACTIVE

## 2013-06-24 MED ORDER — VANCOMYCIN HCL 10 G IV SOLR
1250.0000 mg | INTRAVENOUS | Status: DC
  Administered 2013-06-24 – 2013-06-25 (×2): 1250 mg via INTRAVENOUS
  Filled 2013-06-24 (×3): qty 1250

## 2013-06-24 MED ORDER — PIPERACILLIN-TAZOBACTAM 3.375 G IVPB
3.3750 g | Freq: Three times a day (TID) | INTRAVENOUS | Status: DC
  Administered 2013-06-24 – 2013-06-27 (×10): 3.375 g via INTRAVENOUS
  Filled 2013-06-24 (×10): qty 50

## 2013-06-24 MED ORDER — DIPHENHYDRAMINE-ZINC ACETATE 2-0.1 % EX CREA
TOPICAL_CREAM | CUTANEOUS | Status: DC | PRN
  Filled 2013-06-24: qty 28

## 2013-06-24 MED ORDER — POTASSIUM CHLORIDE 10 MEQ/100ML IV SOLN
10.0000 meq | INTRAVENOUS | Status: AC
  Administered 2013-06-24 (×3): 10 meq via INTRAVENOUS
  Filled 2013-06-24 (×2): qty 100

## 2013-06-24 MED ORDER — FENTANYL 100 MCG/HR TD PT72
100.0000 ug | MEDICATED_PATCH | TRANSDERMAL | Status: DC
Start: 2013-06-24 — End: 2013-06-25
  Administered 2013-06-24: 100 ug via TRANSDERMAL
  Filled 2013-06-24: qty 1

## 2013-06-24 MED ORDER — ALBUTEROL SULFATE (2.5 MG/3ML) 0.083% IN NEBU
2.5000 mg | INHALATION_SOLUTION | Freq: Four times a day (QID) | RESPIRATORY_TRACT | Status: DC | PRN
  Administered 2013-06-24: 2.5 mg via RESPIRATORY_TRACT
  Filled 2013-06-24: qty 3

## 2013-06-24 NOTE — Consult Note (Signed)
Consult requested by: [facility] Consult requested for respiratory failure:  HPI: This is a 58 year old who came to the emergency department with altered mental status. He was hypoxic and confused with elevated white blood cell count and abnormal chest x-ray and he was intubated and started on mechanical ventilation. He has a history of systolic heart failure with reduced ejection fraction, diabetes type 2 on insulin, chronic back pain on chronic narcotics, sleep apnea, coronary artery occlusive disease, chronic kidney disease and what may be a seizure disorder from the medical record. All of this information is from the medical record because he is intubated sedated and there is no family present.  Past Medical History  Diagnosis Date  . Diabetes mellitus   . Hypertension   . CHF (congestive heart failure)   . CRD (chronic renal disease)   . Arthritis   . Obesity   . Depression   . Sleep apnea   . Diabetic neuropathy   . CAD (coronary artery disease)   . Mitral regurgitation   . Esophagitis, reflux 07/07/10    MW tear, erosive reflux esophagitis, 2cm hh  . S/P colonoscopy April 2012    Rourk: torturous colon, hyperplastic polyps  . Collagen vascular disease     "myoclonic "  . Tremor   . Diverticulum   . Hiatal hernia   . GERD (gastroesophageal reflux disease)     erosive  . Gastroparesis 11/2010    No emptying at two hours.   . Chronic back pain   . Clonic seizure     controlled with klonodine     Family History  Problem Relation Age of Onset  . Stroke Father 52  . Stroke Mother 61  . Colon cancer Neg Hx   . Crohn's disease Neg Hx   . Cirrhosis Neg Hx   . Ulcerative colitis Neg Hx   . Stomach cancer Neg Hx      History   Social History  . Marital Status: Married    Spouse Name: N/A    Number of Children: 1  . Years of Education: N/A   Occupational History  . disabled    Social History Main Topics  . Smoking status: Current Every Day Smoker -- 0.50  packs/day for 40 years    Types: Cigarettes  . Smokeless tobacco: Never Used     Comment: ordered nicotine patches. start date is 27 July 2010  . Alcohol Use: No  . Drug Use: Yes    Special: Marijuana     Comment: few times year  . Sexual Activity: None     Comment: erectile dysfunction   Other Topics Concern  . None   Social History Narrative   Lost one dgt due to homicide.     ROS: Not obtainable    Objective: Vital signs in last 24 hours: Temp:  [97.8 F (36.6 C)-99 F (37.2 C)] 97.8 F (36.6 C) (02/28 0445) Pulse Rate:  [62-139] 65 (02/28 0600) Resp:  [10-35] 14 (02/28 0600) BP: (72-191)/(43-99) 125/62 mmHg (02/28 0600) SpO2:  [81 %-100 %] 93 % (02/28 0600) FiO2 (%):  [40 %-100 %] 40 % (02/28 0317) Weight:  [96.4 kg (212 lb 8.4 oz)-106.4 kg (234 lb 9.1 oz)] 96.4 kg (212 lb 8.4 oz) (02/28 0600) Weight change:  Last BM Date: 06/23/13  Intake/Output from previous day: 02/27 0701 - 02/28 0700 In: 161.6 [I.V.:101.6; IV Piggyback:50] Out: 3150 [Urine:3150]  PHYSICAL EXAM He is intubated and sedated. His pupils are reactive. Nose and  throat are clear. Neck is supple without masses. His chest shows rhonchi bilaterally. His heart is regular without gallop. His heart sounds are somewhat obscured by airway noise. His abdomen is soft without masses. Bowel sounds are present and active. Extremities show no edema his central nervous system was grossly intact earlier in Be assessed very well now because of his sedation.  Lab Results: Basic Metabolic Panel:  Recent Labs  06/23/13 1301 06/24/13 0542  NA 146 148*  K 3.9 3.3*  CL 103 109  CO2 30 28  GLUCOSE 228* 85  BUN 39* 32*  CREATININE 2.49* 2.29*  CALCIUM 9.3 8.9   Liver Function Tests: No results found for this basename: AST, ALT, ALKPHOS, BILITOT, PROT, ALBUMIN,  in the last 72 hours No results found for this basename: LIPASE, AMYLASE,  in the last 72 hours  Recent Labs  06/23/13 1302  AMMONIA 33    CBC:  Recent Labs  06/23/13 1301 06/24/13 0542  WBC 20.6* 13.5*  HGB 13.1 11.6*  HCT 40.3 35.0*  MCV 88.6 88.6  PLT 232 206   Cardiac Enzymes:  Recent Labs  06/23/13 1438  TROPONINI <0.30   BNP:  Recent Labs  06/23/13 1602  PROBNP 2103.0*   D-Dimer: No results found for this basename: DDIMER,  in the last 72 hours CBG:  Recent Labs  06/23/13 1744 06/23/13 2014 06/23/13 2305 06/24/13 0453 06/24/13 0716  GLUCAP 212* 216* 144* 83 84   Hemoglobin A1C: No results found for this basename: HGBA1C,  in the last 72 hours Fasting Lipid Panel:  Recent Labs  06/23/13 1438  TRIG 82   Thyroid Function Tests: No results found for this basename: TSH, T4TOTAL, FREET4, T3FREE, THYROIDAB,  in the last 72 hours Anemia Panel: No results found for this basename: VITAMINB12, FOLATE, FERRITIN, TIBC, IRON, RETICCTPCT,  in the last 72 hours Coagulation: No results found for this basename: LABPROT, INR,  in the last 72 hours Urine Drug Screen: Drugs of Abuse     Component Value Date/Time   LABOPIA NONE DETECTED 06/23/2013 1432   COCAINSCRNUR NONE DETECTED 06/23/2013 1432   LABBENZ NONE DETECTED 06/23/2013 1432   AMPHETMU NONE DETECTED 06/23/2013 1432   THCU NONE DETECTED 06/23/2013 1432   LABBARB NONE DETECTED 06/23/2013 1432    Alcohol Level: No results found for this basename: ETH,  in the last 72 hours Urinalysis:  Recent Labs  06/23/13 1432  COLORURINE YELLOW  LABSPEC 1.020  PHURINE 6.5  GLUCOSEU NEGATIVE  HGBUR TRACE*  BILIRUBINUR NEGATIVE  KETONESUR NEGATIVE  PROTEINUR 30*  UROBILINOGEN 0.2  NITRITE NEGATIVE  LEUKOCYTESUR NEGATIVE   Misc. Labs:   ABGS:  Recent Labs  06/24/13 0500  PHART 7.484*  PO2ART 69.7*  TCO2 24.7  HCO3 27.7*     MICROBIOLOGY: Recent Results (from the past 240 hour(s))  MRSA PCR SCREENING     Status: None   Collection Time    06/23/13  5:47 PM      Result Value Ref Range Status   MRSA by PCR NEGATIVE  NEGATIVE  Final   Comment:            The GeneXpert MRSA Assay (FDA     approved for NASAL specimens     only), is one component of a     comprehensive MRSA colonization     surveillance program. It is not     intended to diagnose MRSA     infection nor to guide or  monitor treatment for     MRSA infections.    Studies/Results: Dg Chest 1 View  06/23/2013   CLINICAL DATA:  Altered mental status.  EXAM: CHEST - 1 VIEW  COMPARISON:  PA and lateral chest 05/06/2012 and 05/30/2013.  FINDINGS: There is cardiomegaly. Alveolar and interstitial opacities are worse on the right with more focal opacity seen in the right upper and lower lung zones. No pneumothorax or pleural fluid.  IMPRESSION: Cardiomegaly and pulmonary edema. More focal airspace opacity in the right upper and lower lung zones likely represents foci of more intense edema rather than pneumonia.   Electronically Signed   By: Inge Rise M.D.   On: 06/23/2013 15:20   Ct Head Wo Contrast  06/23/2013   CLINICAL DATA:  Altered level of consciousness.  EXAM: CT HEAD WITHOUT CONTRAST  TECHNIQUE: Contiguous axial images were obtained from the base of the skull through the vertex without intravenous contrast.  COMPARISON:  MR HEAD W/O CM dated 12/09/2012; CT HEAD W/O CM dated 12/08/2012  FINDINGS: Image quality is degraded by motion. No evidence of an acute infarct, acute hemorrhage, mass lesion, mass effect or hydrocephalus. Mild atrophy. Visualized portions of the paranasal sinuses and mastoid air cells are clear.  IMPRESSION: 1. Image quality is degraded by motion. 2. No definite acute intracranial abnormality. 3. Mild atrophy.   Electronically Signed   By: Lorin Picket M.D.   On: 06/23/2013 14:07   Dg Chest Port 1 View  06/24/2013   CLINICAL DATA:  Intubated  EXAM: PORTABLE CHEST - 1 VIEW  COMPARISON:  the previous day's study  FINDINGS: Endotracheal tube and nasogastric tube stable in position. Cervical fixation hardware partially seen. Patchy  airspace opacities throughout the right lung have partially improved. Patchy airspace best is at the left lung base slightly more conspicuous. Heart size upper limits normal. . No effusion. Dorsal stimulator leads project over the lower thoracic spine.  IMPRESSION: Asymmetric airspace disease right greater than left, slightly improved since previous exam.  Support hardware stable in position.   Electronically Signed   By: Arne Cleveland M.D.   On: 06/24/2013 07:22   Dg Chest Port 1v Same Day  06/23/2013   CLINICAL DATA:  Intubation  EXAM: PORTABLE CHEST - 1 VIEW SAME DAY  COMPARISON:  Portable exam 1609 hr compared earlier study of 15/11 hr  FINDINGS: Endotracheal tube tip projects 5.2 cm above carinal.  Nasogastric tube extends into stomach.  Enlargement of cardiac silhouette with pulmonary vascular congestion.  Asymmetric infiltrate throughout right lung versus left may represent asymmetric edema or infection.  No pleural effusion or pneumothorax.  Prior cervical spine fusion.  Bones otherwise unremarkable.  IMPRESSION: Endotracheal nasogastric tube positions as above.  Enlargement of cardiac silhouette with pulmonary vascular congestion and asymmetric pulmonary infiltrates right greater than left question asymmetric edema versus infection.   Electronically Signed   By: Lavonia Dana M.D.   On: 06/23/2013 16:22    Medications:  Prior to Admission:  Prescriptions prior to admission  Medication Sig Dispense Refill  . atorvastatin (LIPITOR) 20 MG tablet Take 20 mg by mouth daily.      . baclofen (LIORESAL) 10 MG tablet Take 5 mg by mouth 4 (four) times daily.      Marland Kitchen buPROPion (WELLBUTRIN) 100 MG tablet Take 100 mg by mouth 2 (two) times daily.      . carvedilol (COREG) 6.25 MG tablet Take 1 tablet (6.25 mg total) by mouth 2 (two) times daily with a  meal.  60 tablet  0  . clonazePAM (KLONOPIN) 0.5 MG tablet Take 0.5-1 mg by mouth 2 (two) times daily as needed for anxiety (1 in the morning and 2 at  bedtime).      . fentaNYL (DURAGESIC - DOSED MCG/HR) 100 MCG/HR Place 100 mcg onto the skin every 3 (three) days.      Marland Kitchen FLUoxetine (PROZAC) 20 MG capsule Take 80 mg by mouth daily.      . furosemide (LASIX) 40 MG tablet Take 1 tablet (40 mg total) by mouth 2 (two) times daily.      Marland Kitchen gabapentin (NEURONTIN) 100 MG capsule Take 300 mg by mouth 3 (three) times daily.      . hydrALAZINE (APRESOLINE) 10 MG tablet Take 1 tablet (10 mg total) by mouth every 8 (eight) hours.  90 tablet  0  . HYDROmorphone (DILAUDID) 4 MG tablet Take 4 mg by mouth every 6 (six) hours as needed.      . insulin aspart protamine-insulin aspart (NOVOLOG 70/30) (70-30) 100 UNIT/ML injection Inject 10-20 Units into the skin 2 (two) times daily with a meal. Units vary based on food consumed.      . isosorbide mononitrate (IMDUR) 30 MG 24 hr tablet Take 15 mg by mouth daily.      Marland Kitchen lisinopril (PRINIVIL,ZESTRIL) 5 MG tablet Take 1 tablet (5 mg total) by mouth daily.  30 tablet  0  . metolazone (ZAROXOLYN) 2.5 MG tablet Take 1 tablet (2.5 mg total) by mouth daily as needed (for increased swelling. After taking a dose, call you cardiologist for further instructions.).  30 tablet  0  . modafinil (PROVIGIL) 200 MG tablet Take 100 mg by mouth 2 (two) times daily.      . Multiple Vitamins-Minerals (CENTRUM SILVER ULTRA MENS) TABS Take 1 tablet by mouth daily.        Marland Kitchen NITROSTAT 0.4 MG SL tablet Place 1 tablet under the tongue every 5 (five) minutes as needed for chest pain.       Marland Kitchen ondansetron (ZOFRAN-ODT) 4 MG disintegrating tablet Take 1 tablet (4 mg total) by mouth every 4 (four) hours as needed for nausea.  20 tablet  0  . oxyCODONE-acetaminophen (PERCOCET/ROXICET) 5-325 MG per tablet Take 1 tablet by mouth every 8 (eight) hours as needed for severe pain.      . pantoprazole (PROTONIX) 40 MG tablet Take 40 mg by mouth daily.      . polyethylene glycol (MIRALAX / GLYCOLAX) packet Take 17 g by mouth daily as needed. For constipation       . rOPINIRole (REQUIP) 3 MG tablet Take 6 mg by mouth at bedtime.      . senna (SENOKOT) 8.6 MG TABS tablet Take 2 tablets by mouth 2 (two) times daily.       Scheduled: . antiseptic oral rinse  15 mL Mouth Rinse QID  . baclofen  5 mg Oral QID  . buPROPion  100 mg Oral BID  . carvedilol  6.25 mg Oral BID WC  . ceFEPime (MAXIPIME) IV  2 g Intravenous Q24H  . chlorhexidine  15 mL Mouth Rinse BID  . FLUoxetine  80 mg Oral Daily  . furosemide  80 mg Intravenous TID  . heparin  5,000 Units Subcutaneous 3 times per day  . insulin aspart  0-9 Units Subcutaneous 6 times per day  . insulin detemir  15 Units Subcutaneous QHS  . pantoprazole (PROTONIX) IV  40 mg Intravenous QHS  . vancomycin  1,250 mg Intravenous Q24H   Continuous: . sodium chloride Stopped (06/23/13 1646)  . propofol 5 mcg/kg/min (06/24/13 0600)   [id number] chloride, clonazePAM, metolazone  Assesment: He is admitted with acute respiratory failure. He is intubated and on the ventilator. This may have been related to changes in his pain medications. I think he has aspirated and has aspiration pneumonia. He has acute encephalopathy with mental status changes. He has systolic heart failure which complicates his situation. He has chronic pain on chronic narcotics and he has diabetes with diabetic peripheral neuropathy and he has been on insulin for that. He has chronic kidney disease stage III which is apparently stable. Active Problems:   CKD (chronic kidney disease), stage III   DM type 2 with diabetic peripheral neuropathy   Chronic constipation   Acute encephalopathy   Acute systolic heart failure   Acute respiratory failure with hypoxia    Plan: I'm going to change him from cefepime to Zosyn because I think it is a little bit better coverage for presumed aspiration. Continue with his other treatments. We can attempt weaning but I'm not sure he'll be ready    LOS: 1 day   Nene Aranas L 06/24/2013, 8:19 AM

## 2013-06-24 NOTE — Progress Notes (Signed)
[facility] PROGRESS NOTE  [name] UUV:[id number] DOB: 04-17-1956 DOA: 06/23/2013 PCP: Annia Belt, MD  Brief narrative: 58 y.o. male with chronic systolic heart failure with an EF of 20%, diabetes type 2 insulin-dependent with a hemoglobin A1c of 6.8, chronic back pain currently on narcotics, who was brought to the emergency department by family on 06/23/2013 for altered mental status. Recently his pain medicine was increased and family reported that his been more confused and more difficult to arouse for the past several days. On arrival the emergency room the patient was found to have 2 fentanyl patches on. [facility] asked to admit for further evaluation.   Assessment/Plan:  Principal Problem:   Acute hypoxic ventilator dependent respiratory failure  - this is possibly multifactorial in etiology and secondary to pulmonary vascular congestion, systolic CHF, narcotic sedation effect, ? Aspiration PNA - pt still intubated, management per PCCM - continuing Lasix 80 mg IV TID, continue broad spectrum ABX Vancomycin day #2 and Zosyn day #1, pt has received Maxipime day #1 Active Problems:   Acute encephalopathy  - secondary to narcotic use and progressively worse due to pulmonary edema, aspiration - management as noted above with Lasix and ABX,  - follow up on blood cultures - pt still intubated and sedated    Acute on chronic systolic CHF  - 2 D ECHO pending - monitor daily weights, weight today 212 lbs - strict I's and O's - continue Lasix 80 mg IV TID    Hypokalemia - supplement via IV route for now - repeat BMP in AM   Hypernatremia - lasix may help, monitor closely  - repeat BMP In AM   CKD (chronic kidney disease), stage III - Cr is trending down, monitor I's and O's - creatinine trend: 2.49 --> 2.39   DM type 2 with diabetic peripheral neuropathy - insulin and SSI while inpatient - CBG's in past 24 hours: 83, 84, 88   Leukocytosis  - secondary to aspiration  pneumonia - ABX as noted above - CBC in AM - follow up on blood culture   Code Status: Full  Family Communication: Family at bedside  Disposition Plan: Remains inpatient   Leisa Lenz, MD  [facility] Pager [phone]  If 7PM-7AM, please contact night-coverage [email or web address] Password [email or web address] 06/24/2013, 9:35 AM   LOS: 1 day   Consultants:  PCCM  Procedures/Studies:  ETT 2/27 -->  CXR   06/23/2013  Cardiomegaly and pulmonary edema. More focal airspace opacity in the right upper and lower lung zones, more intense edema rather than pneumonia.   Ct Head Wo Contrast   06/23/2013  Image quality is degraded by motion. No definite acute intracranial abnormality. Mild atrophy.   CXR 06/24/2013  Asymmetric airspace disease right greater than left, slightly improved since previous exam.  Support hardware stable in position.     CXR  06/23/2013  Endotracheal nasogastric tube positioned., pulmonary vascular congestion and asymmetric pulmonary infiltrates R>L, ? asymmetric edema versus infection.     Antibiotics:  Maxipime 2/27 --> 2/28  Vancomycin 2/27 -->  Zosyn 2/28 -->  HPI/Subjective: Pt requiring intubation.   Objective: Filed Vitals:   06/24/13 0745 06/24/13 0800 06/24/13 0815 06/24/13 0830  BP: 134/61 119/55 138/63 133/64  Pulse: 72 66 66 65  Temp:      TempSrc:      Resp: 12 13 14 14   Height:      Weight:      SpO2: 92% 91% 94% 94%    Intake/Output Summary (  Last 24 hours) at 06/24/13 0935 Last data filed at 06/24/13 0800  Gross per 24 hour  Intake 419.89 ml  Output   3150 ml  Net -2730.11 ml    Exam:   General:  Pt is on vent support this AM  Cardiovascular: Regular rate and rhythm, S1/S2, no murmurs, no rubs, no gallops  Respiratory: bilateral crackles, diminished air movement at bases   Abdomen: Soft, non tender, non distended, bowel sounds present, no guarding  Extremities: (+1) LE pitting edema, pulses DP and PT palpable bilaterally  Data  Reviewed: Basic Metabolic Panel:  Recent Labs Lab 06/23/13 1301 06/24/13 0542  NA 146 148*  K 3.9 3.3*  CL 103 109  CO2 30 28  GLUCOSE 228* 85  BUN 39* 32*  CREATININE 2.49* 2.29*  CALCIUM 9.3 8.9    Recent Labs Lab 06/23/13 1302  AMMONIA 33   CBC:  Recent Labs Lab 06/23/13 1301 06/24/13 0542  WBC 20.6* 13.5*  HGB 13.1 11.6*  HCT 40.3 35.0*  MCV 88.6 88.6  PLT 232 206   Cardiac Enzymes:  Recent Labs Lab 06/23/13 1438  TROPONINI <0.30   CBG:  Recent Labs Lab 06/23/13 1744 06/23/13 2014 06/23/13 2305 06/24/13 0453 06/24/13 0716  GLUCAP 212* 216* 144* 83 84    CULTURE, BLOOD (ROUTINE X 2)     Status: None   Collection Time    06/23/13  2:38 PM      Result Value Ref Range Status   Specimen Description LEFT ANTECUBITAL   Final   Report Status PENDING   Incomplete  CULTURE, BLOOD (ROUTINE X 2)     Status: None   Collection Time    06/23/13  2:43 PM      Result Value Ref Range Status   Specimen Description BLOOD LEFT ARM   Final   Report Status PENDING   Incomplete  MRSA PCR SCREENING     Status: None   Collection Time    06/23/13  5:47 PM      Result Value Ref Range Status   MRSA by PCR NEGATIVE  NEGATIVE Final     Scheduled Meds: . baclofen  5 mg Oral QID  . buPROPion  100 mg Oral BID  . carvedilol  6.25 mg Oral BID WC  . FLUoxetine  80 mg Oral Daily  . furosemide  80 mg Intravenous TID  . heparin  5,000 Units Subcutaneous 3 times per day  . insulin aspart  0-9 Units Subcutaneous 6 times per day  . insulin detemir  15 Units Subcutaneous QHS  . pantoprazoleIV  40 mg Intravenous QHS  . ZOSYN IV  3.375 g Intravenous Q8H  . vancomycin  1,250 mg Intravenous Q24H   Continuous Infusions: . propofol 8 mcg/kg/min (06/24/13 0800)

## 2013-06-24 NOTE — Progress Notes (Signed)
INITIAL NUTRITION ASSESSMENT  DOCUMENTATION CODES Per approved criteria  -Not applicable   INTERVENTION: Recommend initiate enteral nutrition within 24-48 hours of intubation if pt is unable to wean within that time frame. Recommend start Vital AF @ 20 ml/hr and advance 10 ml/hr every 6 hours to goal rate of 45 ml/hr. This will provide 1296 kcals, 81 grams protein, and 876 ml fluid to goal rate. Also add 30 ml Prostat BID, which will provide additional 200 kcals and 30 grams protein. Total regimen includding Prostat will provide 1496 kcals and 11 grams protein daily. This will meet 99% of estimated calorie needs and 100% of protein needs, with the inclusion of current rate of propofol.  If no IV fluids, also recommend 90 ml flush every 4 hours before and after each flush, which will provide additional 1080 ml fluids and meet 98% of estimated fluid needs with inclusion of feeding (total fluid with feeding and flushes 1956 ml).   NUTRITION DIAGNOSIS: Inadequate oral intake related to inability to eat as evidenced by NPO, mechanical ventilation.   Goal: Pt will meet >90% of estimated nutritional needs  Monitor:  Respiratory status, nutrition support initiation, labs, skin assessments, weight changes  Reason for Assessment: Vent  58 y.o. male  Admitting Dx: <principal problem not specified>  ASSESSMENT: Patient is currently intubated on ventilator support.  MV: 6.8 L/min Temp (24hrs), Avg:98.8 F (37.1 C), Min:97.8 F (36.6 C), Max:99.8 F (37.7 C)  Propofol: 10 ml/hr (264 kcals)  Pt admitted with AMS and acute respiratory failure with hypoxia due to heart failure, possible HCAP vs narcotic overdose.  Wt hx reveals weight loss over the past several years, however, weight loss not significant for time frame. Wt hc reveals a 7.8% wt loss x 1 year, 0.2% wt loss x 6 months, and 0.5% wt loss x 1 month. Weight has been very stable over the past 6 months.   Height: Ht Readings from Last 1  Encounters:  06/23/13 6' (1.829 m)    Weight: Wt Readings from Last 1 Encounters:  06/24/13 212 lb 8.4 oz (96.4 kg)    Ideal Body Weight: 178#  % Ideal Body Weight: 119%  Wt Readings from Last 10 Encounters:  06/24/13 212 lb 8.4 oz (96.4 kg)  06/04/13 211 lb 3.2 oz (95.8 kg)  12/10/12 217 lb 2.5 oz (98.5 kg)  08/13/12 210 lb (95.255 kg)  05/06/12 230 lb (104.327 kg)  03/31/12 220 lb (99.791 kg)  02/17/12 212 lb (96.163 kg)  01/27/12 211 lb 6.4 oz (95.89 kg)  12/24/11 209 lb 3.5 oz (94.9 kg)  12/04/11 207 lb (93.895 kg)    Usual Body Weight: 298#  % Usual Body Weight: 71%  BMI:  Body mass index is 28.82 kg/(m^2). Meets criteria for overweight.   Estimated Nutritional Needs: Kcal: 2048 kcals Protein: 97-145 grams daily Fluid: 2.0-2.1 L daily  Skin: WDL  Diet Order: NPO  EDUCATION NEEDS: -Education not appropriate at this time   Intake/Output Summary (Last 24 hours) at 06/24/13 1351 Last data filed at 06/24/13 1306  Gross per 24 hour  Intake 837.74 ml  Output   4750 ml  Net -3912.26 ml    Last BM: 06/23/13  Labs:   Recent Labs Lab 06/23/13 1301 06/24/13 0542  NA 146 148*  K 3.9 3.3*  CL 103 109  CO2 30 28  BUN 39* 32*  CREATININE 2.49* 2.29*  CALCIUM 9.3 8.9  GLUCOSE 228* 85    CBG (last 3)   Recent  Labs  06/24/13 0453 06/24/13 0716 06/24/13 1124  GLUCAP 83 84 88    Scheduled Meds: . antiseptic oral rinse  15 mL Mouth Rinse QID  . baclofen  5 mg Oral QID  . buPROPion  100 mg Oral BID  . carvedilol  6.25 mg Oral BID WC  . chlorhexidine  15 mL Mouth Rinse BID  . fentaNYL  100 mcg Transdermal Q72H  . FLUoxetine  80 mg Oral Daily  . furosemide  80 mg Intravenous TID  . heparin  5,000 Units Subcutaneous 3 times per day  . insulin aspart  0-9 Units Subcutaneous 6 times per day  . insulin detemir  15 Units Subcutaneous QHS  . pantoprazole (PROTONIX) IV  40 mg Intravenous QHS  . piperacillin-tazobactam (ZOSYN)  IV  3.375 g Intravenous  Q8H  . vancomycin  1,250 mg Intravenous Q24H    Continuous Infusions: . propofol 15.664 mcg/kg/min (06/24/13 1306)    Past Medical History  Diagnosis Date  . Diabetes mellitus   . Hypertension   . CHF (congestive heart failure)   . CRD (chronic renal disease)   . Arthritis   . Obesity   . Depression   . Sleep apnea   . Diabetic neuropathy   . CAD (coronary artery disease)   . Mitral regurgitation   . Esophagitis, reflux 07/07/10    MW tear, erosive reflux esophagitis, 2cm hh  . S/P colonoscopy April 2012    Rourk: torturous colon, hyperplastic polyps  . Collagen vascular disease     "myoclonic "  . Tremor   . Diverticulum   . Hiatal hernia   . GERD (gastroesophageal reflux disease)     erosive  . Gastroparesis 11/2010    No emptying at two hours.   . Chronic back pain   . Clonic seizure     controlled with klonodine    Past Surgical History  Procedure Laterality Date  . Neck surgery      discectomy  . Hand surgery      right-carpal tunnel  . Colonoscopy  before 2002    [place]  . Appendectomy    . Other surgical history      bone graft-knee  . Anterior lumbar corpectomy w/ fusion      C5-6/C4-7  . Colonoscopy  08/21/2010    elongaed tortuous colon otherwise normal  . Esophagogastroduodenoscopy  07/08/10    Dr. Volney American esophageal erosions and excoriations consistent with erosive reflux esophagitis, hiatal hernia  . Knee arthroscopy      right  . Esophagogastroduodenoscopy (egd) with propofol  02/25/2012    Procedure: ESOPHAGOGASTRODUODENOSCOPY (EGD) WITH PROPOFOL;  Surgeon: Daneil Dolin, MD;  Location: [facility];  Service: Endoscopy;  Laterality: N/A;  7:30 /POLYPHARMACY    Davontae Prusinski A. Jimmye Norman, RD, LDN Pager: [phone]

## 2013-06-24 NOTE — Progress Notes (Signed)
Echocardiogram 2D Echocardiogram has been performed.  [name] 06/24/2013, 11:27 AM

## 2013-06-24 NOTE — Progress Notes (Signed)
Patient developed a rash on right arm within 15 minutes of initiation of vancomycin and zosyn infusion. Dr Neil Crouch notified by phone. Dr Charlies Silvers said to slow vancomycin infusion, and apply benadryl ointment to affected area. Rash covers right forearm, has not spread to rest of body. Patient remains intubated.

## 2013-06-24 NOTE — Progress Notes (Addendum)
ANTIBIOTIC CONSULT NOTE - INITIAL  Pharmacy Consult for Vancomycin and Zosyn Indication: pneumonia  Allergies  Allergen Reactions  . Ms Contin [Morphine Sulfate Er]     Doesn't like to take in liquid form( burns and doesn't help) (pill form works fine)    Patient Measurements: Height: 6' (182.9 cm) Weight: 212 lb 8.4 oz (96.4 kg) IBW/kg (Calculated) : 77.6  Vital Signs: Temp: 97.8 F (36.6 C) (02/28 0445) Temp src: Axillary (02/28 0445) BP: 125/62 mmHg (02/28 0600) Pulse Rate: 65 (02/28 0600) Intake/Output from previous day: 02/27 0701 - 02/28 0700 In: 161.6 [I.V.:101.6; IV Piggyback:50] Out: 3150 [Urine:3150] Intake/Output from this shift:    Labs:  Recent Labs  06/23/13 1301 06/24/13 0542  WBC 20.6* 13.5*  HGB 13.1 11.6*  PLT 232 206  CREATININE 2.49* 2.29*   Estimated Creatinine Clearance: 42.3 ml/min (by C-G formula based on Cr of 2.29). No results found for this basename: VANCOTROUGH, Corlis Leak, VANCORANDOM, [place], GENTPEAK, GENTRANDOM, TOBRATROUGH, TOBRAPEAK, TOBRARND, AMIKACINPEAK, AMIKACINTROU, AMIKACIN,  in the last 72 hours   Microbiology: Recent Results (from the past 720 hour(s))  MRSA PCR SCREENING     Status: None   Collection Time    06/23/13  5:47 PM      Result Value Ref Range Status   MRSA by PCR NEGATIVE  NEGATIVE Final   Comment:            The GeneXpert MRSA Assay (FDA     approved for NASAL specimens     only), is one component of a     comprehensive MRSA colonization     surveillance program. It is not     intended to diagnose MRSA     infection nor to guide or     monitor treatment for     MRSA infections.   Medical History: Past Medical History  Diagnosis Date  . Diabetes mellitus   . Hypertension   . CHF (congestive heart failure)   . CRD (chronic renal disease)   . Arthritis   . Obesity   . Depression   . Sleep apnea   . Diabetic neuropathy   . CAD (coronary artery disease)   . Mitral regurgitation   .  Esophagitis, reflux 07/07/10    MW tear, erosive reflux esophagitis, 2cm hh  . S/P colonoscopy April 2012    Rourk: torturous colon, hyperplastic polyps  . Collagen vascular disease     "myoclonic "  . Tremor   . Diverticulum   . Hiatal hernia   . GERD (gastroesophageal reflux disease)     erosive  . Gastroparesis 11/2010    No emptying at two hours.   . Chronic back pain   . Clonic seizure     controlled with klonodine   Medications:  Scheduled:  . antiseptic oral rinse  15 mL Mouth Rinse QID  . baclofen  5 mg Oral QID  . buPROPion  100 mg Oral BID  . carvedilol  6.25 mg Oral BID WC  . ceFEPime (MAXIPIME) IV  2 g Intravenous Q24H  . chlorhexidine  15 mL Mouth Rinse BID  . FLUoxetine  80 mg Oral Daily  . furosemide  80 mg Intravenous TID  . heparin  5,000 Units Subcutaneous 3 times per day  . insulin aspart  0-9 Units Subcutaneous 6 times per day  . insulin detemir  15 Units Subcutaneous QHS  . pantoprazole (PROTONIX) IV  40 mg Intravenous QHS  . vancomycin  1,250 mg Intravenous Q24H  Assessment: 58yo male admitted for AMS and suspected pneumonia.  Pt is obese with elevated SCr.  Initially started on Vancomycin and Cefepime.    Estimated Creatinine Clearance: 42.3 ml/min (by C-G formula based on Cr of 2.29). Discussed with Dr Luan Pulling.  D/C cefepime and switch to Zosyn for suspected aspiration pna.    Vancomycin 2/27 >> Cefepime 2/27 >> 2/28 Zosyn 2/28 >>  Goal of Therapy:  Vancomycin trough level 15-20 mcg/ml  Plan:  Vancomycin 1500mg  IV x 1 (loading dose) then Vancomycin 1250mg  IV q24hrs Check trough at steady state Zosyn 3.375gm IV q8hrs (for clcr > 20) Monitor labs, renal fxn, and cultures  Hart Robinsons A 06/24/2013,7:53 AM

## 2013-06-25 LAB — BASIC METABOLIC PANEL
BUN: 36 mg/dL — ABNORMAL HIGH (ref 6–23)
CO2: 29 mEq/L (ref 19–32)
Calcium: 9.2 mg/dL (ref 8.4–10.5)
Chloride: 103 mEq/L (ref 96–112)
Creatinine, Ser: 2.51 mg/dL — ABNORMAL HIGH (ref 0.50–1.35)
GFR, EST AFRICAN AMERICAN: 31 mL/min — AB (ref >90)
GFR, EST NON AFRICAN AMERICAN: 27 mL/min — AB (ref >90)
GLUCOSE: 106 mg/dL — AB (ref 70–99)
POTASSIUM: 3 meq/L — AB (ref 3.7–5.3)
SODIUM: 146 meq/L (ref 137–147)

## 2013-06-25 LAB — CBC
HCT: 34.5 % — ABNORMAL LOW (ref 39.0–52.0)
HEMOGLOBIN: 11.4 g/dL — AB (ref 13.0–17.0)
MCH: 29.1 pg (ref 26.0–34.0)
MCHC: 33 g/dL (ref 30.0–36.0)
MCV: 88 fL (ref 78.0–100.0)
Platelets: 209 10*3/uL (ref 150–400)
RBC: 3.92 MIL/uL — ABNORMAL LOW (ref 4.22–5.81)
RDW: 14.9 % (ref 11.5–15.5)
WBC: 9.2 10*3/uL (ref 4.0–10.5)

## 2013-06-25 LAB — GLUCOSE, CAPILLARY
GLUCOSE-CAPILLARY: 114 mg/dL — AB (ref 70–99)
GLUCOSE-CAPILLARY: 119 mg/dL — AB (ref 70–99)
GLUCOSE-CAPILLARY: 127 mg/dL — AB (ref 70–99)
Glucose-Capillary: 89 mg/dL (ref 70–99)
Glucose-Capillary: 101 mg/dL — ABNORMAL HIGH (ref 70–99)
Glucose-Capillary: 133 mg/dL — ABNORMAL HIGH (ref 70–99)

## 2013-06-25 MED ORDER — HYDROMORPHONE HCL PF 1 MG/ML IJ SOLN
1.0000 mg | Freq: Once | INTRAMUSCULAR | Status: AC
  Administered 2013-06-25: 1 mg via INTRAVENOUS
  Filled 2013-06-25: qty 1

## 2013-06-25 MED ORDER — SODIUM CHLORIDE 0.9 % IV SOLN
10.0000 ug/h | INTRAVENOUS | Status: DC
  Administered 2013-06-25: 10 ug/h via INTRAVENOUS
  Filled 2013-06-25: qty 50

## 2013-06-25 MED ORDER — POTASSIUM CHLORIDE 10 MEQ/100ML IV SOLN: 10.0000 meq | INTRAVENOUS | Status: DC

## 2013-06-25 MED ORDER — POTASSIUM CHLORIDE 10 MEQ/100ML IV SOLN
10.0000 meq | INTRAVENOUS | Status: AC
  Administered 2013-06-25 (×4): 10 meq via INTRAVENOUS
  Filled 2013-06-25 (×4): qty 100

## 2013-06-25 MED ORDER — SODIUM CHLORIDE 0.9 % IV SOLN
0.0000 ug/h | INTRAVENOUS | Status: DC
  Administered 2013-06-25 – 2013-06-26 (×3): 400 ug/h via INTRAVENOUS
  Filled 2013-06-25 (×3): qty 50

## 2013-06-25 MED ORDER — VITAL HIGH PROTEIN PO LIQD
1000.0000 mL | ORAL | Status: DC
  Administered 2013-06-25: 1000 mL
  Filled 2013-06-25 (×4): qty 1000

## 2013-06-25 MED ORDER — FENTANYL CITRATE 0.05 MG/ML IJ SOLN: 50.0000 ug | INTRAMUSCULAR | Status: DC | PRN

## 2013-06-25 NOTE — Progress Notes (Signed)
[facility] PROGRESS NOTE  YAW ESCOTO PIR:[id number] DOB: 05-21-1955 DOA: 06/23/2013 PCP: Annia Belt, MD  Brief narrative: 58 y.o. male with chronic systolic heart failure with an EF of 20%, diabetes type 2 insulin-dependent with a hemoglobin A1c of 6.8, chronic back pain currently on narcotics, who was brought to the emergency department by family on 06/23/2013 for altered mental status. Recently his pain medicine was increased and family reported that his been more confused and more difficult to arouse for the past several days. On arrival the emergency room the patient was found to have 2 fentanyl patches on. [facility] asked to admit for further evaluation.   Assessment/Plan:    Acute hypoxic ventilator dependent respiratory failure  - This is possibly multifactorial in etiology and secondary to pulmonary vascular congestion, systolic CHF, narcotic sedation effect, ? Aspiration PNA - pt still intubated, management per PCCM, Dr Luan Pulling.  - continuing Lasix 80 mg IV TID, continue broad spectrum ABX Vancomycin day #3 and Zosyn day #2.     Acute encephalopathy  - secondary to narcotic use and progressively worse due to pulmonary edema, aspiration - management as noted above with Lasix and ABX,  - follow up on blood cultures no growth to date.  - pt still intubated and sedated     Acute on chronic systolic CHF  - 2 D ECHO Ef 20 %.  - monitor daily weights, weight today 91.1 Kg.  - strict I's and O's - continue Lasix 80 mg IV TID     Hypokalemia - supplement via IV route for now - repeat BMP in AM    Hypernatremia - repeat BMP In AM    CKD (chronic kidney disease), stage III - Cr is trending down, monitor I's and O's - creatinine trend: 2.49 --> 2.39--2.5 -per records Cr goes from 2.0 to 2.6.     DM type 2 with diabetic peripheral neuropathy - insulin and SSI while inpatient    Leukocytosis  - secondary to aspiration pneumonia - ABX as noted above - CBC in AM - follow up  on blood culture   Code Status: Full  Family Communication: none at bedside.  Disposition Plan: Remains inpatient   Mima Cranmore, MD  [facility] Pager [phone]  If 7PM-7AM, please contact night-coverage [email or web address] Password [email or web address] 06/25/2013, 9:32 AM   LOS: 2 days   Consultants:  PCCM  Procedures/Studies:  ETT 2/27 -->  CXR   06/23/2013  Cardiomegaly and pulmonary edema. More focal airspace opacity in the right upper and lower lung zones, more intense edema rather than pneumonia.   Ct Head Wo Contrast   06/23/2013  Image quality is degraded by motion. No definite acute intracranial abnormality. Mild atrophy.   CXR 06/24/2013  Asymmetric airspace disease right greater than left, slightly improved since previous exam.  Support hardware stable in position.     CXR  06/23/2013  Endotracheal nasogastric tube positioned., pulmonary vascular congestion and asymmetric pulmonary infiltrates R>L, ? asymmetric edema versus infection.     Antibiotics:  Maxipime 2/27 --> 2/28  Vancomycin 2/27 -->  Zosyn 2/28 -->  HPI/Subjective: Pt requiring intubation. Following some command.   Objective: Filed Vitals:   06/25/13 0700 06/25/13 0715 06/25/13 0730 06/25/13 0900  BP: 145/70 157/91  134/68  Pulse:    65  Temp:   97.7 F (36.5 C)   TempSrc:   Axillary   Resp: 18 20  27   Height:      Weight:      SpO2:  99%    Intake/Output Summary (Last 24 hours) at 06/25/13 0932 Last data filed at 06/25/13 0645  Gross per 24 hour  Intake 1100.64 ml  Output   5750 ml  Net -4649.36 ml    Exam:   General:  Pt is on vent support this AM  Cardiovascular: Regular rate and rhythm, S1/S2, no murmurs, no rubs, no gallops  Respiratory: bilateral crackles, diminished air movement at bases   Abdomen: Soft, non tender, non distended, bowel sounds present, no guarding  Extremities: (+1) LE pitting edema, pulses DP and PT palpable bilaterally  Data Reviewed: Basic Metabolic  Panel:  Recent Labs Lab 06/23/13 1301 06/24/13 0542 06/25/13 0437  NA 146 148* 146  K 3.9 3.3* 3.0*  CL 103 109 103  CO2 30 28 29   GLUCOSE 228* 85 106*  BUN 39* 32* 36*  CREATININE 2.49* 2.29* 2.51*  CALCIUM 9.3 8.9 9.2    Recent Labs Lab 06/23/13 1302  AMMONIA 33   CBC:  Recent Labs Lab 06/23/13 1301 06/24/13 0542 06/25/13 0437  WBC 20.6* 13.5* 9.2  HGB 13.1 11.6* 11.4*  HCT 40.3 35.0* 34.5*  MCV 88.6 88.6 88.0  PLT 232 206 209   Cardiac Enzymes:  Recent Labs Lab 06/23/13 1438  TROPONINI <0.30   CBG:  Recent Labs Lab 06/24/13 1615 06/24/13 1940 06/25/13 0004 06/25/13 0411 06/25/13 0728  GLUCAP 99 119* 119* 114* 89    CULTURE, BLOOD (ROUTINE X 2)     Status: None   Collection Time    06/23/13  2:38 PM      Result Value Ref Range Status   Specimen Description LEFT ANTECUBITAL   Final   Report Status PENDING   Incomplete  CULTURE, BLOOD (ROUTINE X 2)     Status: None   Collection Time    06/23/13  2:43 PM      Result Value Ref Range Status   Specimen Description BLOOD LEFT ARM   Final   Report Status PENDING   Incomplete  MRSA PCR SCREENING     Status: None   Collection Time    06/23/13  5:47 PM      Result Value Ref Range Status   MRSA by PCR NEGATIVE  NEGATIVE Final     Scheduled Meds: . baclofen  5 mg Oral QID  . buPROPion  100 mg Oral BID  . carvedilol  6.25 mg Oral BID WC  . FLUoxetine  80 mg Oral Daily  . furosemide  80 mg Intravenous TID  . heparin  5,000 Units Subcutaneous 3 times per day  . insulin aspart  0-9 Units Subcutaneous 6 times per day  . insulin detemir  15 Units Subcutaneous QHS  . pantoprazoleIV  40 mg Intravenous QHS  . ZOSYN IV  3.375 g Intravenous Q8H  . vancomycin  1,250 mg Intravenous Q24H   Continuous Infusions: . fentaNYL infusion INTRAVENOUS    . propofol 20 mcg/kg/min (06/25/13 0741)

## 2013-06-25 NOTE — Progress Notes (Signed)
Pharmacist Heart Failure Core Measure Documentation  Assessment: [name] has an EF documented as 20-25% on 2/28 by echo.  Rationale: Heart failure patients with left ventricular systolic dysfunction (LVSD) and an EF < 40% should be prescribed an angiotensin converting enzyme inhibitor (ACEI) or angiotensin receptor blocker (ARB) at discharge unless a contraindication is documented in the medical record.  This patient is not currently on an ACEI or ARB for HF.  This note is being placed in the record in order to provide documentation that a contraindication to the use of these agents is present for this encounter.  ACE Inhibitor or Angiotensin Receptor Blocker is contraindicated (specify all that apply)  []   ACEI allergy AND ARB allergy []   Angioedema []   Moderate or severe aortic stenosis []   Hyperkalemia []   Hypotension []   Renal artery stenosis [x]   Worsening renal function, preexisting renal disease or dysfunction   Hart Robinsons A 06/25/2013 12:50 PM

## 2013-06-25 NOTE — Progress Notes (Signed)
Subjective: He remains intubated and on the ventilator. I have discussed his situation with his nurse at bedside and she's concerned about his pain control. He has long-term chronic pain and has had recent surgery. He has a fentanyl patch on but he does not appear to be controlling his pain. The other issue is that he's having significant secretions still.  Objective: Vital signs in last 24 hours: Temp:  [97.7 F (36.5 C)-99.6 F (37.6 C)] 97.7 F (36.5 C) (03/01 0730) Pulse Rate:  [38-92] 65 (03/01 0900) Resp:  [0-27] 27 (03/01 0900) BP: (101-170)/(54-91) 134/68 mmHg (03/01 0900) SpO2:  [67 %-100 %] 99 % (03/01 0900) FiO2 (%):  [40 %] 40 % (03/01 0900) Weight:  [91.1 kg (200 lb 13.4 oz)] 91.1 kg (200 lb 13.4 oz) (03/01 0500) Weight change: -15.3 kg (-33 lb 11.7 oz) Last BM Date: 06/23/13  Intake/Output from previous day: 02/28 0701 - 03/01 0700 In: 1235.8 [I.V.:535.8; IV Piggyback:700] Out: 5750 [Urine:5750]  PHYSICAL EXAM General appearance: He is intubated sedated on the ventilator. He is currently undergoing wakeup assessment and he is moving around and responding appropriately Resp: rhonchi bilaterally Cardio: regular rate and rhythm, S1, S2 normal, no murmur, click, rub or gallop GI: soft, non-tender; bowel sounds normal; no masses,  no organomegaly Extremities: extremities normal, atraumatic, no cyanosis or edema  Lab Results:    Basic Metabolic Panel:  Recent Labs  06/24/13 0542 06/25/13 0437  NA 148* 146  K 3.3* 3.0*  CL 109 103  CO2 28 29  GLUCOSE 85 106*  BUN 32* 36*  CREATININE 2.29* 2.51*  CALCIUM 8.9 9.2   Liver Function Tests: No results found for this basename: AST, ALT, ALKPHOS, BILITOT, PROT, ALBUMIN,  in the last 72 hours No results found for this basename: LIPASE, AMYLASE,  in the last 72 hours  Recent Labs  06/23/13 1302  AMMONIA 33   CBC:  Recent Labs  06/24/13 0542 06/25/13 0437  WBC 13.5* 9.2  HGB 11.6* 11.4*  HCT 35.0* 34.5*   MCV 88.6 88.0  PLT 206 209   Cardiac Enzymes:  Recent Labs  06/23/13 1438  TROPONINI <0.30   BNP:  Recent Labs  06/23/13 1602 06/24/13 0542  PROBNP 2103.0* 9222.0*   D-Dimer: No results found for this basename: DDIMER,  in the last 72 hours CBG:  Recent Labs  06/24/13 1124 06/24/13 1615 06/24/13 1940 06/25/13 0004 06/25/13 0411 06/25/13 0728  GLUCAP 88 99 119* 119* 114* 89   Hemoglobin A1C: No results found for this basename: HGBA1C,  in the last 72 hours Fasting Lipid Panel:  Recent Labs  06/23/13 1438  TRIG 82   Thyroid Function Tests: No results found for this basename: TSH, T4TOTAL, FREET4, T3FREE, THYROIDAB,  in the last 72 hours Anemia Panel: No results found for this basename: VITAMINB12, FOLATE, FERRITIN, TIBC, IRON, RETICCTPCT,  in the last 72 hours Coagulation: No results found for this basename: LABPROT, INR,  in the last 72 hours Urine Drug Screen: Drugs of Abuse     Component Value Date/Time   LABOPIA NONE DETECTED 06/23/2013 1432   COCAINSCRNUR NONE DETECTED 06/23/2013 1432   LABBENZ NONE DETECTED 06/23/2013 1432   AMPHETMU NONE DETECTED 06/23/2013 1432   THCU NONE DETECTED 06/23/2013 1432   LABBARB NONE DETECTED 06/23/2013 1432    Alcohol Level: No results found for this basename: ETH,  in the last 72 hours Urinalysis:  Recent Labs  06/23/13 [place] 1.020  PHURINE 6.5  GLUCOSEU NEGATIVE  HGBUR TRACE*  BILIRUBINUR NEGATIVE  KETONESUR NEGATIVE  PROTEINUR 30*  UROBILINOGEN 0.2  NITRITE NEGATIVE  LEUKOCYTESUR NEGATIVE   Misc. Labs:  ABGS  Recent Labs  06/24/13 0500  PHART 7.484*  PO2ART 69.7*  TCO2 24.7  HCO3 27.7*   CULTURES Recent Results (from the past 240 hour(s))  CULTURE, BLOOD (ROUTINE X 2)     Status: None   Collection Time    06/23/13  2:38 PM      Result Value Ref Range Status   Specimen Description LEFT ANTECUBITAL   Final   Special Requests BOTTLES DRAWN AEROBIC AND ANAEROBIC  8CC   Final   Culture NO GROWTH 1 DAY   Final   Report Status PENDING   Incomplete  CULTURE, BLOOD (ROUTINE X 2)     Status: None   Collection Time    06/23/13  2:43 PM      Result Value Ref Range Status   Specimen Description BLOOD LEFT ARM   Final   Special Requests BOTTLES DRAWN AEROBIC AND ANAEROBIC 10CC   Final   Culture NO GROWTH 1 DAY   Final   Report Status PENDING   Incomplete  MRSA PCR SCREENING     Status: None   Collection Time    06/23/13  5:47 PM      Result Value Ref Range Status   MRSA by PCR NEGATIVE  NEGATIVE Final   Comment:            The GeneXpert MRSA Assay (FDA     approved for NASAL specimens     only), is one component of a     comprehensive MRSA colonization     surveillance program. It is not     intended to diagnose MRSA     infection nor to guide or     monitor treatment for     MRSA infections.   Studies/Results: Dg Chest 1 View  06/23/2013   CLINICAL DATA:  Altered mental status.  EXAM: CHEST - 1 VIEW  COMPARISON:  PA and lateral chest 05/06/2012 and 05/30/2013.  FINDINGS: There is cardiomegaly. Alveolar and interstitial opacities are worse on the right with more focal opacity seen in the right upper and lower lung zones. No pneumothorax or pleural fluid.  IMPRESSION: Cardiomegaly and pulmonary edema. More focal airspace opacity in the right upper and lower lung zones likely represents foci of more intense edema rather than pneumonia.   Electronically Signed   By: Inge Rise M.D.   On: 06/23/2013 15:20   Ct Head Wo Contrast  06/23/2013   CLINICAL DATA:  Altered level of consciousness.  EXAM: CT HEAD WITHOUT CONTRAST  TECHNIQUE: Contiguous axial images were obtained from the base of the skull through the vertex without intravenous contrast.  COMPARISON:  MR HEAD W/O CM dated 12/09/2012; CT HEAD W/O CM dated 12/08/2012  FINDINGS: Image quality is degraded by motion. No evidence of an acute infarct, acute hemorrhage, mass lesion, mass effect or  hydrocephalus. Mild atrophy. Visualized portions of the paranasal sinuses and mastoid air cells are clear.  IMPRESSION: 1. Image quality is degraded by motion. 2. No definite acute intracranial abnormality. 3. Mild atrophy.   Electronically Signed   By: Lorin Picket M.D.   On: 06/23/2013 14:07   Dg Chest Port 1 View  06/24/2013   CLINICAL DATA:  Intubated  EXAM: PORTABLE CHEST - 1 VIEW  COMPARISON:  the previous day's study  FINDINGS: Endotracheal tube and nasogastric  tube stable in position. Cervical fixation hardware partially seen. Patchy airspace opacities throughout the right lung have partially improved. Patchy airspace best is at the left lung base slightly more conspicuous. Heart size upper limits normal. . No effusion. Dorsal stimulator leads project over the lower thoracic spine.  IMPRESSION: Asymmetric airspace disease right greater than left, slightly improved since previous exam.  Support hardware stable in position.   Electronically Signed   By: Arne Cleveland M.D.   On: 06/24/2013 07:22   Dg Chest Port 1v Same Day  06/23/2013   CLINICAL DATA:  Intubation  EXAM: PORTABLE CHEST - 1 VIEW SAME DAY  COMPARISON:  Portable exam 1609 hr compared earlier study of 15/11 hr  FINDINGS: Endotracheal tube tip projects 5.2 cm above carinal.  Nasogastric tube extends into stomach.  Enlargement of cardiac silhouette with pulmonary vascular congestion.  Asymmetric infiltrate throughout right lung versus left may represent asymmetric edema or infection.  No pleural effusion or pneumothorax.  Prior cervical spine fusion.  Bones otherwise unremarkable.  IMPRESSION: Endotracheal nasogastric tube positions as above.  Enlargement of cardiac silhouette with pulmonary vascular congestion and asymmetric pulmonary infiltrates right greater than left question asymmetric edema versus infection.   Electronically Signed   By: Lavonia Dana M.D.   On: 06/23/2013 16:22    Medications:  Prior to Admission:  Prescriptions  prior to admission  Medication Sig Dispense Refill  . atorvastatin (LIPITOR) 20 MG tablet Take 20 mg by mouth daily.      . baclofen (LIORESAL) 10 MG tablet Take 5 mg by mouth 4 (four) times daily.      Marland Kitchen buPROPion (WELLBUTRIN) 100 MG tablet Take 100 mg by mouth 2 (two) times daily.      . carvedilol (COREG) 6.25 MG tablet Take 1 tablet (6.25 mg total) by mouth 2 (two) times daily with a meal.  60 tablet  0  . clonazePAM (KLONOPIN) 0.5 MG tablet Take 0.5-1 mg by mouth 2 (two) times daily as needed for anxiety (1 in the morning and 2 at bedtime).      . fentaNYL (DURAGESIC - DOSED MCG/HR) 100 MCG/HR Place 100 mcg onto the skin every 3 (three) days.      Marland Kitchen FLUoxetine (PROZAC) 20 MG capsule Take 80 mg by mouth daily.      . furosemide (LASIX) 40 MG tablet Take 1 tablet (40 mg total) by mouth 2 (two) times daily.      Marland Kitchen gabapentin (NEURONTIN) 100 MG capsule Take 300 mg by mouth 3 (three) times daily.      . hydrALAZINE (APRESOLINE) 10 MG tablet Take 1 tablet (10 mg total) by mouth every 8 (eight) hours.  90 tablet  0  . HYDROmorphone (DILAUDID) 4 MG tablet Take 4 mg by mouth every 6 (six) hours as needed.      . insulin aspart protamine-insulin aspart (NOVOLOG 70/30) (70-30) 100 UNIT/ML injection Inject 10-20 Units into the skin 2 (two) times daily with a meal. Units vary based on food consumed.      . isosorbide mononitrate (IMDUR) 30 MG 24 hr tablet Take 15 mg by mouth daily.      Marland Kitchen lisinopril (PRINIVIL,ZESTRIL) 5 MG tablet Take 1 tablet (5 mg total) by mouth daily.  30 tablet  0  . metolazone (ZAROXOLYN) 2.5 MG tablet Take 1 tablet (2.5 mg total) by mouth daily as needed (for increased swelling. After taking a dose, call you cardiologist for further instructions.).  30 tablet  0  .  modafinil (PROVIGIL) 200 MG tablet Take 100 mg by mouth 2 (two) times daily.      . Multiple Vitamins-Minerals (CENTRUM SILVER ULTRA MENS) TABS Take 1 tablet by mouth daily.        Marland Kitchen NITROSTAT 0.4 MG SL tablet Place 1  tablet under the tongue every 5 (five) minutes as needed for chest pain.       Marland Kitchen ondansetron (ZOFRAN-ODT) 4 MG disintegrating tablet Take 1 tablet (4 mg total) by mouth every 4 (four) hours as needed for nausea.  20 tablet  0  . oxyCODONE-acetaminophen (PERCOCET/ROXICET) 5-325 MG per tablet Take 1 tablet by mouth every 8 (eight) hours as needed for severe pain.      . pantoprazole (PROTONIX) 40 MG tablet Take 40 mg by mouth daily.      . polyethylene glycol (MIRALAX / GLYCOLAX) packet Take 17 g by mouth daily as needed. For constipation      . rOPINIRole (REQUIP) 3 MG tablet Take 6 mg by mouth at bedtime.      . senna (SENOKOT) 8.6 MG TABS tablet Take 2 tablets by mouth 2 (two) times daily.       Scheduled: . antiseptic oral rinse  15 mL Mouth Rinse QID  . baclofen  5 mg Oral QID  . buPROPion  100 mg Oral BID  . carvedilol  6.25 mg Oral BID WC  . chlorhexidine  15 mL Mouth Rinse BID  . FLUoxetine  80 mg Oral Daily  . furosemide  80 mg Intravenous TID  . heparin  5,000 Units Subcutaneous 3 times per day  . insulin aspart  0-9 Units Subcutaneous 6 times per day  . insulin detemir  15 Units Subcutaneous QHS  . pantoprazole (PROTONIX) IV  40 mg Intravenous QHS  . piperacillin-tazobactam (ZOSYN)  IV  3.375 g Intravenous Q8H  . vancomycin  1,250 mg Intravenous Q24H   Continuous: . fentaNYL infusion INTRAVENOUS    . propofol 20 mcg/kg/min (06/25/13 0741)   HEN:IDPOEU chloride, albuterol, clonazePAM, diphenhydrAMINE-zinc acetate, metolazone  Assesment: He was admitted with acute respiratory failure with hypoxia probably related to aspiration pneumonia. He had acute encephalopathy that may have been related to medications. He has multiple other medical problems including systolic heart failure, chronic kidney disease and diabetes. He has chronic pain in his pain does not seem to be controlled Active Problems:   CKD (chronic kidney disease), stage III   DM type 2 with diabetic peripheral  neuropathy   Chronic constipation   Acute encephalopathy   Acute systolic heart failure   Acute respiratory failure with hypoxia    Plan: I started low dose fentanyl drip to see but can make him more comfortable and we may be able to reduce his sedation which may make it easier to get him off the ventilator. It's not clear to me he's going to come off the ventilator today because of his secretions    LOS: 2 days   Payal Stanforth L 06/25/2013, 9:05 AM

## 2013-06-26 ENCOUNTER — Inpatient Hospital Stay (HOSPITAL_COMMUNITY): Payer: Medicare HMO

## 2013-06-26 DIAGNOSIS — G894 Chronic pain syndrome: Secondary | ICD-10-CM | POA: Diagnosis present

## 2013-06-26 DIAGNOSIS — J690 Pneumonitis due to inhalation of food and vomit: Secondary | ICD-10-CM | POA: Diagnosis present

## 2013-06-26 LAB — BLOOD GAS, ARTERIAL
Acid-Base Excess: 4.2 mmol/L — ABNORMAL HIGH (ref 0.0–2.0)
BICARBONATE: 27.6 meq/L — AB (ref 20.0–24.0)
Drawn by: 22223
FIO2: 0.4 %
O2 Saturation: 95.2 %
PEEP: 5 cmH2O
PH ART: 7.482 — AB (ref 7.350–7.450)
Patient temperature: 37
RATE: 14 resp/min
TCO2: 24.8 mmol/L (ref 0–100)
VT: 600 mL
pCO2 arterial: 37.3 mmHg (ref 35.0–45.0)
pO2, Arterial: 87.7 mmHg (ref 80.0–100.0)

## 2013-06-26 LAB — LEGIONELLA ANTIGEN, URINE: Legionella Antigen, Urine: NEGATIVE

## 2013-06-26 LAB — URINALYSIS, ROUTINE W REFLEX MICROSCOPIC
BILIRUBIN URINE: NEGATIVE
Glucose, UA: NEGATIVE mg/dL
Ketones, ur: NEGATIVE mg/dL
NITRITE: NEGATIVE
Protein, ur: 30 mg/dL — AB
SPECIFIC GRAVITY, URINE: 1.025 (ref 1.005–1.030)
UROBILINOGEN UA: 0.2 mg/dL (ref 0.0–1.0)
pH: 6.5 (ref 5.0–8.0)

## 2013-06-26 LAB — GLUCOSE, CAPILLARY
GLUCOSE-CAPILLARY: 136 mg/dL — AB (ref 70–99)
GLUCOSE-CAPILLARY: 159 mg/dL — AB (ref 70–99)
GLUCOSE-CAPILLARY: 109 mg/dL — AB (ref 70–99)
GLUCOSE-CAPILLARY: 188 mg/dL — AB (ref 70–99)
Glucose-Capillary: 163 mg/dL — ABNORMAL HIGH (ref 70–99)
Glucose-Capillary: 165 mg/dL — ABNORMAL HIGH (ref 70–99)

## 2013-06-26 LAB — URINE MICROSCOPIC-ADD ON

## 2013-06-26 LAB — BASIC METABOLIC PANEL
BUN: 47 mg/dL — AB (ref 6–23)
CHLORIDE: 106 meq/L (ref 96–112)
CO2: 30 mEq/L (ref 19–32)
Calcium: 8.9 mg/dL (ref 8.4–10.5)
Creatinine, Ser: 2.96 mg/dL — ABNORMAL HIGH (ref 0.50–1.35)
GFR calc non Af Amer: 22 mL/min — ABNORMAL LOW (ref >90)
GFR, EST AFRICAN AMERICAN: 25 mL/min — AB (ref >90)
Glucose, Bld: 177 mg/dL — ABNORMAL HIGH (ref 70–99)
Potassium: 3.4 mEq/L — ABNORMAL LOW (ref 3.7–5.3)
Sodium: 149 mEq/L — ABNORMAL HIGH (ref 137–147)

## 2013-06-26 LAB — CBC
HEMATOCRIT: 34.9 % — AB (ref 39.0–52.0)
Hemoglobin: 11.4 g/dL — ABNORMAL LOW (ref 13.0–17.0)
MCH: 28.9 pg (ref 26.0–34.0)
MCHC: 32.7 g/dL (ref 30.0–36.0)
MCV: 88.4 fL (ref 78.0–100.0)
Platelets: 208 10*3/uL (ref 150–400)
RBC: 3.95 MIL/uL — ABNORMAL LOW (ref 4.22–5.81)
RDW: 14.8 % (ref 11.5–15.5)
WBC: 6.5 10*3/uL (ref 4.0–10.5)

## 2013-06-26 LAB — VANCOMYCIN, TROUGH: Vancomycin Tr: 25.6 ug/mL (ref 10.0–20.0)

## 2013-06-26 MED ORDER — INSULIN ASPART 100 UNIT/ML ~~LOC~~ SOLN
0.0000 [IU] | Freq: Three times a day (TID) | SUBCUTANEOUS | Status: DC
  Administered 2013-06-27: 2 [IU] via SUBCUTANEOUS
  Administered 2013-06-27: 1 [IU] via SUBCUTANEOUS
  Administered 2013-06-27 – 2013-06-28 (×3): 2 [IU] via SUBCUTANEOUS

## 2013-06-26 MED ORDER — POTASSIUM CHLORIDE 10 MEQ/100ML IV SOLN
10.0000 meq | INTRAVENOUS | Status: AC
  Administered 2013-06-26 (×4): 10 meq via INTRAVENOUS
  Filled 2013-06-26 (×4): qty 100

## 2013-06-26 MED ORDER — FUROSEMIDE 40 MG PO TABS
40.0000 mg | ORAL_TABLET | Freq: Two times a day (BID) | ORAL | Status: DC
  Administered 2013-06-27 – 2013-06-28 (×3): 40 mg via ORAL
  Filled 2013-06-26 (×3): qty 1

## 2013-06-26 MED ORDER — VANCOMYCIN HCL 10 G IV SOLR: 1250.0000 mg | INTRAVENOUS | Status: DC

## 2013-06-26 MED ORDER — CLONAZEPAM 0.5 MG PO TABS: 0.5000 mg | ORAL_TABLET | Freq: Two times a day (BID) | ORAL | Status: DC | PRN

## 2013-06-26 NOTE — Progress Notes (Signed)
NUTRITION FOLLOW UP  Intervention:   Follow for diet advancement  Nutrition Dx:   Inadequate oral intake related to inability to eat as evidenced by NPO, recent extubation; ongoing   Goal:   Pt will meet >90% of estimated nutritional needs; goal not met  Monitor:   Diet advancement, PO intake, labs, skin assessments, weight changes  Assessment:   Received MD consult to initiate TF. Noted that Vital High Protein was initiated yesterday at 40 ml/hr. Pt was extubated this morning and TF and propofol dip was d/c. Nutritional needs reestimated. Pt remains NPO. Pt somnolent at time of visit. Per RN, pt slow to respond to questions and commands.  Wt hx reveals 11# (5.2%) wt loss x 2 days, which is significant, but likely due to fluid loss.   Height: Ht Readings from Last 1 Encounters:  06/23/13 6' (1.829 m)    Weight Status:   Wt Readings from Last 1 Encounters:  06/26/13 201 lb 4.5 oz (91.3 kg)    Re-estimated needs:  Kcal: 1700-1800 daily Protein: 73-92 grams daily Fluid: 1.7-1.8 L daily  Skin: WDL  Diet Order: NPO   Intake/Output Summary (Last 24 hours) at 06/26/13 1257 Last data filed at 06/26/13 1147  Gross per 24 hour  Intake 1425.67 ml  Output   2325 ml  Net -899.33 ml    Last BM: 06/23/13   Labs:   Recent Labs Lab 06/24/13 0542 06/25/13 0437 06/26/13 0513  NA 148* 146 149*  K 3.3* 3.0* 3.4*  CL 109 103 106  CO2 '28 29 30  ' BUN 32* 36* 47*  CREATININE 2.29* 2.51* 2.96*  CALCIUM 8.9 9.2 8.9  GLUCOSE 85 106* 177*    CBG (last 3)   Recent Labs  06/26/13 0342 06/26/13 0733 06/26/13 1118  GLUCAP 136* 159* 109*    Scheduled Meds: . antiseptic oral rinse  15 mL Mouth Rinse QID  . buPROPion  100 mg Oral BID  . carvedilol  6.25 mg Oral BID WC  . chlorhexidine  15 mL Mouth Rinse BID  . FLUoxetine  80 mg Oral Daily  . [START ON 06/27/2013] furosemide  40 mg Oral BID  . heparin  5,000 Units Subcutaneous 3 times per day  . insulin aspart  0-9 Units  Subcutaneous 6 times per day  . insulin detemir  15 Units Subcutaneous QHS  . pantoprazole (PROTONIX) IV  40 mg Intravenous QHS  . piperacillin-tazobactam (ZOSYN)  IV  3.375 g Intravenous Q8H  . potassium chloride  10 mEq Intravenous Q1 Hr x 4  . vancomycin  1,250 mg Intravenous Q24H    Continuous Infusions:   Miranda Garber A. Jimmye Norman, RD, LDN Pager: [phone]

## 2013-06-26 NOTE — Evaluation (Signed)
Clinical/Bedside Swallow Evaluation  Patient Details  Name: [name] MRN: [id number] Date of Birth: [birth year]  Today's Date: 06/26/2013 Time: 5885-0277 SLP Time Calculation (min): 19 min  Past Medical History:  Past Medical History  Diagnosis Date  . Diabetes mellitus   . Hypertension   . CHF (congestive heart failure)   . CRD (chronic renal disease)   . Arthritis   . Obesity   . Depression   . Sleep apnea   . Diabetic neuropathy   . CAD (coronary artery disease)   . Mitral regurgitation   . Esophagitis, reflux 07/07/10    MW tear, erosive reflux esophagitis, 2cm hh  . S/P colonoscopy April 2012    Rourk: torturous colon, hyperplastic polyps  . Collagen vascular disease     "myoclonic "  . Tremor   . Diverticulum   . Hiatal hernia   . GERD (gastroesophageal reflux disease)     erosive  . Gastroparesis 11/2010    No emptying at two hours.   . Chronic back pain   . Clonic seizure     controlled with klonodine   Past Surgical History:  Past Surgical History  Procedure Laterality Date  . Neck surgery      discectomy  . Hand surgery      right-carpal tunnel  . Colonoscopy  before 2002    [place]  . Appendectomy    . Other surgical history      bone graft-knee  . Anterior lumbar corpectomy w/ fusion      C5-6/C4-7  . Colonoscopy  08/21/2010    elongaed tortuous colon otherwise normal  . Esophagogastroduodenoscopy  07/08/10    Dr. Volney American esophageal erosions and excoriations consistent with erosive reflux esophagitis, hiatal hernia  . Knee arthroscopy      right  . Esophagogastroduodenoscopy (egd) with propofol  02/25/2012    Procedure: ESOPHAGOGASTRODUODENOSCOPY (EGD) WITH PROPOFOL;  Surgeon: Daneil Dolin, MD;  Location: [facility];  Service: Endoscopy;  Laterality: N/A;  7:30 /POLYPHARMACY   HPI:  58 year old man with history of chronic systolic congestive heart failure with EF 20-25%, chronic pain, polypharmacy with recurrent episodes of acute  encephalopathy presented to the emergency department with several day history of increasing somnolence and confusion, by report pain medication had been recently adjusted. On arrival the patient was found to have two 171mcg Fenatyl patches on. He became hypoxic and was intubated in the emergency department. Admitted for acute hypoxic respiratory failure thought secondary to acute systolic heart failure, possible pneumonia (elevated white blood cell count and asymmetric infiltrate on chest x-ray) and suspected narcotic-induced encephalopathy.   Assessment / Plan / Recommendation Clinical Impression  Mr. Crumble shows no overt signs or symptoms consistent with aspiration with consistencies/textures presented. He does demonstrate some impulsivity with intake, however he reports being very thirsty/hungry. He also seemed fixated on looking out the window into the parking lot looking for his wife. Pt did cough occasionally after po given, but did not seem related to po intake. Recommed regular diet with intermittent supervision due to impulsivity. SLP will follow up for diet tolerance.    Aspiration Risk  Mild    Diet Recommendation Regular;Thin liquid   Liquid Administration via: Cup Medication Administration: Whole meds with liquid Supervision: Intermittent supervision to cue for compensatory strategies (due to impulsivity) Compensations: Small sips/bites Postural Changes and/or Swallow Maneuvers: Seated upright 90 degrees;Out of bed for meals;Upright 30-60 min after meal    Other  Recommendations Oral Care Recommendations: Oral care BID  Other Recommendations: Clarify dietary restrictions   Follow Up Recommendations  None    Frequency and Duration min 2x/week  1 week       SLP Swallow Goals  Pt will demonstrate safe and efficient consumption of regular textures and thin liquids with intermittent supervision due to impulsivity.   Swallow Study Prior Functional Status   Lives at home     General Date of Onset: 06/23/13 HPI: 58 year old man with history of chronic systolic congestive heart failure with EF 20-25%, chronic pain, polypharmacy with recurrent episodes of acute encephalopathy presented to the emergency department with several day history of increasing somnolence and confusion, by report pain medication had been recently adjusted. On arrival the patient was found to have two 158mcg Fenatyl patches on. He became hypoxic and was intubated in the emergency department. Admitted for acute hypoxic respiratory failure thought secondary to acute systolic heart failure, possible pneumonia (elevated white blood cell count and asymmetric infiltrate on chest x-ray) and suspected narcotic-induced encephalopathy. Type of Study: Bedside swallow evaluation Previous Swallow Assessment: none on record Diet Prior to this Study: NPO Temperature Spikes Noted: Yes Respiratory Status: Nasal cannula History of Recent Intubation: Yes Length of Intubations (days): 3 days Date extubated: 06/26/13 Behavior/Cognition: Alert;Cooperative;Pleasant mood (after being tearful initially) Oral Cavity - Dentition: Adequate natural dentition Self-Feeding Abilities: Able to feed self Patient Positioning: Upright in bed Baseline Vocal Quality: Clear Volitional Cough: Strong;Congested Volitional Swallow: Able to elicit    Oral/Motor/Sensory Function Overall Oral Motor/Sensory Function: Appears within functional limits for tasks assessed   Ice Chips Ice chips: Within functional limits Presentation: Spoon   Thin Liquid Thin Liquid: Within functional limits Presentation: Cup;Self Fed    Nectar Thick Nectar Thick Liquid: Not tested   Honey Thick Honey Thick Liquid: Not tested   Puree Puree: Within functional limits Presentation: Spoon   Solid       Solid: Within functional limits Presentation: Self Fed      Thank you,  Genene Churn, [place]  Anastasha Ortez 06/26/2013,3:49  PM

## 2013-06-26 NOTE — Progress Notes (Signed)
UR chart review completed.  

## 2013-06-26 NOTE — Progress Notes (Signed)
Pt extubated per Dr. Luan Pulling. To a 4 L [place]. Pt NIF -60, FVC 2.1L, followed directions leak test +. No ABG was done.

## 2013-06-26 NOTE — Progress Notes (Addendum)
CRITICAL VALUE ALERT  Critical value received:  Vancomycin trough 25.6  Date of notification:  Schonewitz, Eulis Canner  Time of notification:  6:01 PM  Critical value read back:yes  Nurse who received alert: Schonewitz, Eulis Canner  MD notified (1st page): Biagio Quint, RPh   Time of first page:  1801  MD notified (2nd page):  Time of second page:  Responding MD:  Biagio Quint, RPh  Time MD responded: 6:06 PM  *Ronalee Belts stated to hold 1800 dose and he would re-calculate dosage to be given.  Orders carried out. Schonewitz, Eulis Canner 06/26/2013 6:08 PM

## 2013-06-26 NOTE — Progress Notes (Signed)
PROGRESS NOTE  [name] ZYS:[id number] DOB: 07-31-1955 DOA: 06/23/2013 PCP: Annia Belt, MD  Summary: 58 year old man with history of chronic systolic congestive heart failure with EF 20-25%, chronic pain, polypharmacy with recurrent episodes of acute encephalopathy presented to the emergency department with several day history of increasing somnolence and confusion, by report pain medication had been recently adjusted. On arrival the patient was found to have two 175mcg Fenatyl patches on. He became hypoxic and was intubated in the emergency department. Admitted for acute hypoxic respiratory failure thought secondary to acute systolic heart failure, possible pneumonia (elevated white blood cell count and asymmetric infiltrate on chest x-ray) and suspected narcotic-induced encephalopathy.  Assessment/Plan: 1. Acute hypoxic respiratory failure, s/p intubation/MV, now extubated 3/2. Suspect multifactorial, history most suggestive of narcotic-induced encephalopathy/sedation, aspiration, possibly acute on chronic systolic heart failure. 2. Acute encephalopathy. No focal neurologic deficits, follow commands. History and exam suggest residual polypharmacy effect. Numerous hospitalizations for similar symptoms (see progress note 2/7 for summary), conclusion in past has been polypharmacy. Based on admission history misuse should also be considered. 3. Polypharmacy, chronic pain. Listed on admission: Baclofen QID, Klonopin BID PRN, fentanyl 100 mcg patch Q72, Percocet, oral Dilaudid every 6 as needed.   4. Possible aspiration pneumonia. Leukocytosis has resolved. Consider changing to oral antibiotics next one to 2 days. 5. Possible acute on chronic systolic congestive heart failure, EF 20-25% no significant change since last echocardiogram. Appears to be compensated at this point. Resume oral Lasix. 6. Diabetes mellitus, neuropathy, gastroparesis . Capillary blood sugars stable. 7. obstructive sleep  apnea. BiPAP each night. 8. Chronic back pain. Close followup with his pain management specialist is recommended. 9. Chronic kidney disease stage III. Appears to be very close to baseline. 10. Tobacco dependence 11. Unspecified myoclonic disorder continue Klonopin as needed.   Now extubated, respiratory status does appear to be improving. Continue treatment for pneumonia, obtain ST evaluation, suspect aspiration though was secondary to sedation rather than mechanical/muscular problem. Continue chronic treatment for heart failure which appears to be stabilized at this point. Suspect biggest component of respiratory illness is chronic pain and polypharmacy.   Med reconciliation prior to discharge, coordinate with his pain specialist. Pain control--may be very difficult to control without causing oversedation.  Stop Baclofen--patient denied being on this medication last admission.  Replace potassium  Adjust Lasix  Check urinalysis  Code Status: full code DVT prophylaxis: heparin Family Communication: none present Disposition Plan: home when improved, remain in ICU today  Murray Hodgkins, MD  [facility]  Pager [phone] If 7PM-7AM, please contact night-coverage at [email or web address], password [facility] 06/26/2013, 9:14 AM  LOS: 3 days   Consultants:  Pulmonology  Procedures:  2-D echocardiogram LVEF 20-25%. Diffuse hypokinesis. Grade 1 diastolic dysfunction.  Antibiotics:  Zosyn 2/27 >>   Vancomycin 2/27 >>   HPI/Subjective: Extubated this morning per pulmonology. RT reports patient appears confused, but no focal deficits, same RT saw patient on admission, reports he appeared similarly altered  on admission.   Patient denies complaints. Offer as little history but does participate in exam and answer simple questions.   Objective: Filed Vitals:   06/26/13 0715 06/26/13 0730 06/26/13 0748 06/26/13 0751  BP: 122/52 134/75  142/76  Pulse:    82  Temp:   98.3 F (36.8 C)     TempSrc:   Axillary   Resp:    12  Height:      Weight:      SpO2:    100%  Intake/Output Summary (Last 24 hours) at 06/26/13 0914 Last data filed at 06/26/13 0600  Gross per 24 hour  Intake 1738.19 ml  Output   1525 ml  Net 213.19 ml     Filed Weights   06/24/13 0600 06/25/13 0500 06/26/13 0500  Weight: 96.4 kg (212 lb 8.4 oz) 91.1 kg (200 lb 13.4 oz) 91.3 kg (201 lb 4.5 oz)    Exam:   Afebrile, vital signs are stable. Normotensive. Normal respiratory rate.  Gen. Appears somewhat confused but not toxic. Appears calm and comfortable.  Psychiatric. Appears confused. Difficult to assess mood and affect. He is oriented to self and follow simple commands appropriately.  Cardiovascular. Regular rate and rhythm. No murmur, rub or gallop. No lower extremity edema.  Respiratory. Clear to auscultation bilaterally. No wheezes, rales or rhonchi. Normal respiratory effort.  Abdomen soft, nontender, nondistended.  Eyes: Pupils equal, round, reactive to light. Normal lids, irises.  ENT: Grossly normal lips and tongue. Oropharynx appears unremarkable.  Abdomen soft nontender nondistended.  Musculoskeletal. Moves all extremities well with excellent tone and grossly normal strength all extremities. Able to lift both legs off the bed. Able to maintain arms lifted up. Excellent bilateral grip strength.  Neurologic. Cranial nerves appear intact.  Data Reviewed:  Capillary blood sugars stable  ABG noted  Potassium 3.4  BUN 36 >> 47  Creatinine 2.51 >> 2.96. Baseline appears to be approximately 2.0-2.5.  CT head negative on admission. Motion degraded.  Chest x-ray 3/2: Improved aeration. Decrease in bilateral airspace disease.  Scheduled Meds: . antiseptic oral rinse  15 mL Mouth Rinse QID  . baclofen  5 mg Oral QID  . buPROPion  100 mg Oral BID  . carvedilol  6.25 mg Oral BID WC  . chlorhexidine  15 mL Mouth Rinse BID  . feeding supplement (VITAL HIGH PROTEIN)   1,000 mL Per Tube Q24H  . FLUoxetine  80 mg Oral Daily  . furosemide  80 mg Intravenous TID  . heparin  5,000 Units Subcutaneous 3 times per day  . insulin aspart  0-9 Units Subcutaneous 6 times per day  . insulin detemir  15 Units Subcutaneous QHS  . pantoprazole (PROTONIX) IV  40 mg Intravenous QHS  . piperacillin-tazobactam (ZOSYN)  IV  3.375 g Intravenous Q8H  . vancomycin  1,250 mg Intravenous Q24H   Continuous Infusions: . fentaNYL infusion INTRAVENOUS 400 mcg/hr (06/26/13 0538)  . propofol 10 mcg/kg/min (06/26/13 0534)    Principal Problem:   Acute respiratory failure with hypoxia Active Problems:   CKD (chronic kidney disease), stage III   OSA on CPAP   DM type 2 with diabetic peripheral neuropathy   Polypharmacy   Chronic constipation   Acute encephalopathy   Acute systolic heart failure   Aspiration pneumonia   Chronic pain syndrome   Time spent 40 minutes

## 2013-06-26 NOTE — Progress Notes (Signed)
Subjective: He is awake and alert sitting up. He remains intubated on the ventilator.  Objective: Vital signs in last 24 hours: Temp:  [97.7 F (36.5 C)-98 F (36.7 C)] 98 F (36.7 C) (03/02 0400) Pulse Rate:  [50-71] 64 (03/02 0447) Resp:  [10-35] 14 (03/02 0645) BP: (85-138)/(42-75) 134/75 mmHg (03/02 0730) SpO2:  [93 %-100 %] 100 % (03/02 0447) FiO2 (%):  [40 %] 40 % (03/02 0447) Weight:  [91.3 kg (201 lb 4.5 oz)] 91.3 kg (201 lb 4.5 oz) (03/02 0500) Weight change: 0.2 kg (7.1 oz) Last BM Date: 06/23/13  Intake/Output from previous day: 03/01 0701 - 03/02 0700 In: 1781 [I.V.:785; NG/GT:196; IV Piggyback:800] Out: 2775 [Urine:2775]  PHYSICAL EXAM General appearance: alert and mild distress Resp: rhonchi bilaterally Cardio: regular rate and rhythm, S1, S2 normal, no murmur, click, rub or gallop GI: soft, non-tender; bowel sounds normal; no masses,  no organomegaly Extremities: extremities normal, atraumatic, no cyanosis or edema  Lab Results:    Basic Metabolic Panel:  Recent Labs  06/25/13 0437 06/26/13 0513  NA 146 149*  K 3.0* 3.4*  CL 103 106  CO2 29 30  GLUCOSE 106* 177*  BUN 36* 47*  CREATININE 2.51* 2.96*  CALCIUM 9.2 8.9   Liver Function Tests: No results found for this basename: AST, ALT, ALKPHOS, BILITOT, PROT, ALBUMIN,  in the last 72 hours No results found for this basename: LIPASE, AMYLASE,  in the last 72 hours  Recent Labs  06/23/13 1302  AMMONIA 33   CBC:  Recent Labs  06/25/13 0437 06/26/13 0513  WBC 9.2 6.5  HGB 11.4* 11.4*  HCT 34.5* 34.9*  MCV 88.0 88.4  PLT 209 208   Cardiac Enzymes:  Recent Labs  06/23/13 1438  TROPONINI <0.30   BNP:  Recent Labs  06/23/13 1602 06/24/13 0542  PROBNP 2103.0* 9222.0*   D-Dimer: No results found for this basename: DDIMER,  in the last 72 hours CBG:  Recent Labs  06/25/13 1113 06/25/13 1619 06/25/13 2002 06/26/13 0009 06/26/13 0342 06/26/13 0733  GLUCAP 101* 133* 127*  165* 136* 159*   Hemoglobin A1C: No results found for this basename: HGBA1C,  in the last 72 hours Fasting Lipid Panel:  Recent Labs  06/23/13 1438  TRIG 82   Thyroid Function Tests: No results found for this basename: TSH, T4TOTAL, FREET4, T3FREE, THYROIDAB,  in the last 72 hours Anemia Panel: No results found for this basename: VITAMINB12, FOLATE, FERRITIN, TIBC, IRON, RETICCTPCT,  in the last 72 hours Coagulation: No results found for this basename: LABPROT, INR,  in the last 72 hours Urine Drug Screen: Drugs of Abuse     Component Value Date/Time   LABOPIA NONE DETECTED 06/23/2013 1432   COCAINSCRNUR NONE DETECTED 06/23/2013 1432   LABBENZ NONE DETECTED 06/23/2013 1432   AMPHETMU NONE DETECTED 06/23/2013 1432   THCU NONE DETECTED 06/23/2013 1432   LABBARB NONE DETECTED 06/23/2013 1432    Alcohol Level: No results found for this basename: ETH,  in the last 72 hours Urinalysis:  Recent Labs  06/23/13 1432  COLORURINE YELLOW  LABSPEC 1.020  PHURINE 6.5  GLUCOSEU NEGATIVE  HGBUR TRACE*  BILIRUBINUR NEGATIVE  KETONESUR NEGATIVE  PROTEINUR 30*  UROBILINOGEN 0.2  NITRITE NEGATIVE  LEUKOCYTESUR NEGATIVE   Misc. Labs:  ABGS  Recent Labs  06/26/13 0500  PHART 7.482*  PO2ART 87.7  TCO2 24.8  HCO3 27.6*   CULTURES Recent Results (from the past 240 hour(s))  CULTURE, BLOOD (ROUTINE X 2)  Status: None   Collection Time    06/23/13  2:38 PM      Result Value Ref Range Status   Specimen Description LEFT ANTECUBITAL   Final   Special Requests BOTTLES DRAWN AEROBIC AND ANAEROBIC 8CC   Final   Culture NO GROWTH 2 DAYS   Final   Report Status PENDING   Incomplete  CULTURE, BLOOD (ROUTINE X 2)     Status: None   Collection Time    06/23/13  2:43 PM      Result Value Ref Range Status   Specimen Description BLOOD LEFT ARM   Final   Special Requests BOTTLES DRAWN AEROBIC AND ANAEROBIC 10CC   Final   Culture NO GROWTH 2 DAYS   Final   Report Status PENDING    Incomplete  MRSA PCR SCREENING     Status: None   Collection Time    06/23/13  5:47 PM      Result Value Ref Range Status   MRSA by PCR NEGATIVE  NEGATIVE Final   Comment:            The GeneXpert MRSA Assay (FDA     approved for NASAL specimens     only), is one component of a     comprehensive MRSA colonization     surveillance program. It is not     intended to diagnose MRSA     infection nor to guide or     monitor treatment for     MRSA infections.   Studies/Results: No results found.  Medications:  Prior to Admission:  Prescriptions prior to admission  Medication Sig Dispense Refill  . atorvastatin (LIPITOR) 20 MG tablet Take 20 mg by mouth daily.      . baclofen (LIORESAL) 10 MG tablet Take 5 mg by mouth 4 (four) times daily.      Marland Kitchen buPROPion (WELLBUTRIN) 100 MG tablet Take 100 mg by mouth 2 (two) times daily.      . carvedilol (COREG) 6.25 MG tablet Take 1 tablet (6.25 mg total) by mouth 2 (two) times daily with a meal.  60 tablet  0  . clonazePAM (KLONOPIN) 0.5 MG tablet Take 0.5-1 mg by mouth 2 (two) times daily as needed for anxiety (1 in the morning and 2 at bedtime).      . fentaNYL (DURAGESIC - DOSED MCG/HR) 100 MCG/HR Place 100 mcg onto the skin every 3 (three) days.      Marland Kitchen FLUoxetine (PROZAC) 20 MG capsule Take 80 mg by mouth daily.      . furosemide (LASIX) 40 MG tablet Take 1 tablet (40 mg total) by mouth 2 (two) times daily.      Marland Kitchen gabapentin (NEURONTIN) 100 MG capsule Take 300 mg by mouth 3 (three) times daily.      . hydrALAZINE (APRESOLINE) 10 MG tablet Take 1 tablet (10 mg total) by mouth every 8 (eight) hours.  90 tablet  0  . HYDROmorphone (DILAUDID) 4 MG tablet Take 4 mg by mouth every 6 (six) hours as needed.      . insulin aspart protamine-insulin aspart (NOVOLOG 70/30) (70-30) 100 UNIT/ML injection Inject 10-20 Units into the skin 2 (two) times daily with a meal. Units vary based on food consumed.      . isosorbide mononitrate (IMDUR) 30 MG 24 hr  tablet Take 15 mg by mouth daily.      Marland Kitchen lisinopril (PRINIVIL,ZESTRIL) 5 MG tablet Take 1 tablet (5 mg total) by mouth daily.  30 tablet  0  . metolazone (ZAROXOLYN) 2.5 MG tablet Take 1 tablet (2.5 mg total) by mouth daily as needed (for increased swelling. After taking a dose, call you cardiologist for further instructions.).  30 tablet  0  . modafinil (PROVIGIL) 200 MG tablet Take 100 mg by mouth 2 (two) times daily.      . Multiple Vitamins-Minerals (CENTRUM SILVER ULTRA MENS) TABS Take 1 tablet by mouth daily.        Marland Kitchen NITROSTAT 0.4 MG SL tablet Place 1 tablet under the tongue every 5 (five) minutes as needed for chest pain.       Marland Kitchen ondansetron (ZOFRAN-ODT) 4 MG disintegrating tablet Take 1 tablet (4 mg total) by mouth every 4 (four) hours as needed for nausea.  20 tablet  0  . oxyCODONE-acetaminophen (PERCOCET/ROXICET) 5-325 MG per tablet Take 1 tablet by mouth every 8 (eight) hours as needed for severe pain.      . pantoprazole (PROTONIX) 40 MG tablet Take 40 mg by mouth daily.      . polyethylene glycol (MIRALAX / GLYCOLAX) packet Take 17 g by mouth daily as needed. For constipation      . rOPINIRole (REQUIP) 3 MG tablet Take 6 mg by mouth at bedtime.      . senna (SENOKOT) 8.6 MG TABS tablet Take 2 tablets by mouth 2 (two) times daily.       Scheduled: . antiseptic oral rinse  15 mL Mouth Rinse QID  . baclofen  5 mg Oral QID  . buPROPion  100 mg Oral BID  . carvedilol  6.25 mg Oral BID WC  . chlorhexidine  15 mL Mouth Rinse BID  . feeding supplement (VITAL HIGH PROTEIN)  1,000 mL Per Tube Q24H  . FLUoxetine  80 mg Oral Daily  . furosemide  80 mg Intravenous TID  . heparin  5,000 Units Subcutaneous 3 times per day  . insulin aspart  0-9 Units Subcutaneous 6 times per day  . insulin detemir  15 Units Subcutaneous QHS  . pantoprazole (PROTONIX) IV  40 mg Intravenous QHS  . piperacillin-tazobactam (ZOSYN)  IV  3.375 g Intravenous Q8H  . vancomycin  1,250 mg Intravenous Q24H    Continuous: . fentaNYL infusion INTRAVENOUS 400 mcg/hr (06/26/13 0538)  . propofol 10 mcg/kg/min (06/26/13 0534)   NOB:SJGGEZ chloride, albuterol, clonazePAM, diphenhydrAMINE-zinc acetate, fentaNYL, metolazone  Assesment: He was admitted with acute respiratory failure with hypoxia. He is intubated on the ventilator. He had acute encephalopathy and altered mental status which may have been related to medications but is not totally clear. He has what appears to be aspiration pneumonia on chest x-ray. This morning he is undergoing wakeup assessment and is awake sitting up in bed and looks pretty comfortable. Active Problems:   CKD (chronic kidney disease), stage III   DM type 2 with diabetic peripheral neuropathy   Chronic constipation   Acute encephalopathy   Acute systolic heart failure   Acute respiratory failure with hypoxia    Plan: Attempt weaning. Weaning was problematic yesterday because of his secretions    LOS: 3 days   [name] 06/26/2013, 7:44 AM

## 2013-06-26 NOTE — Progress Notes (Signed)
Rt is worried about the PT's neurological status. Rt spoke with Dr. Sarajane Jews.

## 2013-06-26 NOTE — Progress Notes (Signed)
ANTIBIOTIC CONSULT NOTE - follow up  Pharmacy Consult for Vancomycin and Zosyn Indication: pneumonia  Allergies  Allergen Reactions  . Ms Contin [Morphine Sulfate Er]     Doesn't like to take in liquid form( burns and doesn't help) (pill form works fine)    Patient Measurements: Height: 6' (182.9 cm) Weight: 201 lb 4.5 oz (91.3 kg) IBW/kg (Calculated) : 77.6  Vital Signs: Temp: 98.3 F (36.8 C) (03/02 0748) Temp src: Axillary (03/02 0748) BP: 155/63 mmHg (03/02 0945) Pulse Rate: 64 (03/02 0930) Intake/Output from previous day: 03/01 0701 - 03/02 0700 In: 1791 [I.V.:795; NG/GT:196; IV Piggyback:800] Out: 2775 [Urine:2775] Intake/Output from this shift: Total I/O In: 20 [I.V.:20] Out: 500 [Urine:500]  Labs:  Recent Labs  06/24/13 0542 06/25/13 0437 06/26/13 0513  WBC 13.5* 9.2 6.5  HGB 11.6* 11.4* 11.4*  PLT 206 209 208  CREATININE 2.29* 2.51* 2.96*   Estimated Creatinine Clearance: 29.5 ml/min (by C-G formula based on Cr of 2.96). No results found for this basename: VANCOTROUGH, Corlis Leak, VANCORANDOM, [place], GENTPEAK, GENTRANDOM, TOBRATROUGH, TOBRAPEAK, TOBRARND, AMIKACINPEAK, AMIKACINTROU, AMIKACIN,  in the last 72 hours   Microbiology: Recent Results (from the past 720 hour(s))  CULTURE, BLOOD (ROUTINE X 2)     Status: None   Collection Time    06/23/13  2:38 PM      Result Value Ref Range Status   Specimen Description LEFT ANTECUBITAL   Final   Special Requests BOTTLES DRAWN AEROBIC AND ANAEROBIC [place]   Final   Culture NO GROWTH 3 DAYS   Final   Report Status PENDING   Incomplete  CULTURE, BLOOD (ROUTINE X 2)     Status: None   Collection Time    06/23/13  2:43 PM      Result Value Ref Range Status   Specimen Description BLOOD LEFT ARM   Final   Special Requests BOTTLES DRAWN AEROBIC AND ANAEROBIC 10CC   Final   Culture NO GROWTH 3 DAYS   Final   Report Status PENDING   Incomplete  MRSA PCR SCREENING     Status: None   Collection Time   06/23/13  5:47 PM      Result Value Ref Range Status   MRSA by PCR NEGATIVE  NEGATIVE Final   Comment:            The GeneXpert MRSA Assay (FDA     approved for NASAL specimens     only), is one component of a     comprehensive MRSA colonization     surveillance program. It is not     intended to diagnose MRSA     infection nor to guide or     monitor treatment for     MRSA infections.   Medical History: Past Medical History  Diagnosis Date  . Diabetes mellitus   . Hypertension   . CHF (congestive heart failure)   . CRD (chronic renal disease)   . Arthritis   . Obesity   . Depression   . Sleep apnea   . Diabetic neuropathy   . CAD (coronary artery disease)   . Mitral regurgitation   . Esophagitis, reflux 07/07/10    MW tear, erosive reflux esophagitis, 2cm hh  . S/P colonoscopy April 2012    Rourk: torturous colon, hyperplastic polyps  . Collagen vascular disease     "myoclonic "  . Tremor   . Diverticulum   . Hiatal hernia   . GERD (gastroesophageal reflux disease)  erosive  . Gastroparesis 11/2010    No emptying at two hours.   . Chronic back pain   . Clonic seizure     controlled with klonodine   Medications:  Scheduled:  . antiseptic oral rinse  15 mL Mouth Rinse QID  . buPROPion  100 mg Oral BID  . carvedilol  6.25 mg Oral BID WC  . chlorhexidine  15 mL Mouth Rinse BID  . FLUoxetine  80 mg Oral Daily  . [START ON 06/27/2013] furosemide  40 mg Oral BID  . heparin  5,000 Units Subcutaneous 3 times per day  . insulin aspart  0-9 Units Subcutaneous 6 times per day  . insulin detemir  15 Units Subcutaneous QHS  . pantoprazole (PROTONIX) IV  40 mg Intravenous QHS  . piperacillin-tazobactam (ZOSYN)  IV  3.375 g Intravenous Q8H  . potassium chloride  10 mEq Intravenous Q1 Hr x 4  . vancomycin  1,250 mg Intravenous Q24H   Assessment: 58yo male admitted for AMS and suspected pneumonia.  Pt is obese with elevated SCr which is rising.  Initially started on  Vancomycin and Cefepime.    Estimated Creatinine Clearance: 29.5 ml/min (by C-G formula based on Cr of 2.96). Discussed with Dr Luan Pulling.  D/C cefepime and switch to Zosyn for suspected aspiration pna.    Vancomycin 2/27 >> Cefepime 2/27 >> 2/28 Zosyn 2/28 >>  Goal of Therapy:  Vancomycin trough level 15-20 mcg/ml  Plan:  Vancomycin 1250mg  IV q24hrs Check trough today before next dose Zosyn 3.375gm IV q8hrs (for clcr > 20) Monitor labs, renal fxn, and cultures  [place] Crane, Kerensa Nicklas A 06/26/2013,11:20 AM

## 2013-06-26 NOTE — Progress Notes (Signed)
RRT setup CPAP at bedside for patient. Unit ready for use and patient able to put mask on and turn machine on by himself. Patient to call if he has any questions, and RRT to follow up with patient in a few hours.

## 2013-06-26 NOTE — Progress Notes (Signed)
Patient slow to respond to questions and commands. Unable to state where he is, the year, or president. Patient reoriented, and questioned a few minutes later, and still was not able to answer questions correctly. Hand grips strong, as well as leg extension. Patient does follow commands, but slow to respond. Dr Sarajane Jews aware of finding, believes due to outpatient polypharmacy use. Patient is not currently receiving any pain med, since fentanyl drip was stopped at 0830. Patient denies pain at this time.

## 2013-06-26 NOTE — Progress Notes (Signed)
Discussed BIPAP use with patient. He stated that he wears a CPAP at home and would rather use that at night. RRT to bring CPAP for patient around 22:30-23:00 tonight and place on patient.

## 2013-06-26 NOTE — Progress Notes (Signed)
ANTIBIOTIC CONSULT NOTE - follow up  Pharmacy Consult for Vancomycin and Zosyn Indication: pneumonia  Allergies  Allergen Reactions  . Ms Contin [Morphine Sulfate Er]     Doesn't like to take in liquid form( burns and doesn't help) (pill form works fine)    Patient Measurements: Height: 6' (182.9 cm) Weight: 201 lb 4.5 oz (91.3 kg) IBW/kg (Calculated) : 77.6  Vital Signs: Temp: 98 F (36.7 C) (03/02 1625) Temp src: Oral (03/02 1625) BP: 165/79 mmHg (03/02 1600) Pulse Rate: 82 (03/02 1600) Intake/Output from previous day: 03/01 0701 - 03/02 0700 In: 1791 [I.V.:795; NG/GT:196; IV Piggyback:800] Out: 2775 [Urine:2775] Intake/Output from this shift: Total I/O In: 20 [I.V.:20] Out: 800 [Urine:800]  Labs:  Recent Labs  06/24/13 0542 06/25/13 0437 06/26/13 0513  WBC 13.5* 9.2 6.5  HGB 11.6* 11.4* 11.4*  PLT 206 209 208  CREATININE 2.29* 2.51* 2.96*   Estimated Creatinine Clearance: 29.5 ml/min (by C-G formula based on Cr of 2.96).  Recent Labs  06/26/13 1647  VANCOTROUGH 25.6*     Microbiology: Recent Results (from the past 720 hour(s))  CULTURE, BLOOD (ROUTINE X 2)     Status: None   Collection Time    06/23/13  2:38 PM      Result Value Ref Range Status   Specimen Description LEFT ANTECUBITAL   Final   Special Requests BOTTLES DRAWN AEROBIC AND ANAEROBIC 8CC   Final   Culture NO GROWTH 3 DAYS   Final   Report Status PENDING   Incomplete  CULTURE, BLOOD (ROUTINE X 2)     Status: None   Collection Time    06/23/13  2:43 PM      Result Value Ref Range Status   Specimen Description BLOOD LEFT ARM   Final   Special Requests BOTTLES DRAWN AEROBIC AND ANAEROBIC 10CC   Final   Culture NO GROWTH 3 DAYS   Final   Report Status PENDING   Incomplete  MRSA PCR SCREENING     Status: None   Collection Time    06/23/13  5:47 PM      Result Value Ref Range Status   MRSA by PCR NEGATIVE  NEGATIVE Final   Comment:            The GeneXpert MRSA Assay (FDA      approved for NASAL specimens     only), is one component of a     comprehensive MRSA colonization     surveillance program. It is not     intended to diagnose MRSA     infection nor to guide or     monitor treatment for     MRSA infections.   Medical History: Past Medical History  Diagnosis Date  . Diabetes mellitus   . Hypertension   . CHF (congestive heart failure)   . CRD (chronic renal disease)   . Arthritis   . Obesity   . Depression   . Sleep apnea   . Diabetic neuropathy   . CAD (coronary artery disease)   . Mitral regurgitation   . Esophagitis, reflux 07/07/10    MW tear, erosive reflux esophagitis, 2cm hh  . S/P colonoscopy April 2012    Rourk: torturous colon, hyperplastic polyps  . Collagen vascular disease     "myoclonic "  . Tremor   . Diverticulum   . Hiatal hernia   . GERD (gastroesophageal reflux disease)     erosive  . Gastroparesis 11/2010    No emptying  at two hours.   . Chronic back pain   . Clonic seizure     controlled with klonodine   Medications:  Scheduled:  . antiseptic oral rinse  15 mL Mouth Rinse QID  . buPROPion  100 mg Oral BID  . carvedilol  6.25 mg Oral BID WC  . chlorhexidine  15 mL Mouth Rinse BID  . FLUoxetine  80 mg Oral Daily  . [START ON 06/27/2013] furosemide  40 mg Oral BID  . heparin  5,000 Units Subcutaneous 3 times per day  . insulin aspart  0-9 Units Subcutaneous 6 times per day  . insulin detemir  15 Units Subcutaneous QHS  . pantoprazole (PROTONIX) IV  40 mg Intravenous QHS  . piperacillin-tazobactam (ZOSYN)  IV  3.375 g Intravenous Q8H  . vancomycin  1,250 mg Intravenous Q24H   Assessment: 58yo male admitted for AMS and suspected pneumonia.  Pt is obese with elevated SCr which is rising.  Initially started on Vancomycin and Cefepime.    Estimated Creatinine Clearance: 29.5 ml/min (by C-G formula based on Cr of 2.96).  Trough above goal.  Vancomycin 2/27 >> Cefepime 2/27 >> 2/28 Zosyn 2/28 >>  Goal of Therapy:   Vancomycin trough level 15-20 mcg/ml  Plan:  Decrease Vancomycin 1250mg  IV q 36 hrs Repeat trough at steady state.  Zosyn 3.375gm IV q8hrs (for clcr > 20) Monitor labs, renal fxn, and cultures  Pricilla Larsson 06/26/2013,6:05 PM

## 2013-06-26 NOTE — Procedures (Signed)
Extubation Procedure Note  Patient Details:   Name: [name] DOB: February 16, 1956 MRN: [id number]   Airway Documentation:  Airway 8 mm (Active)  Secured at (cm) 26 cm 06/26/2013  7:51 AM  Measured From Lips 06/26/2013  7:51 AM  Secured Location Right 06/26/2013  7:51 AM  Secured By [employer] 06/26/2013  7:51 AM  Tube Holder Repositioned Yes 06/26/2013  7:51 AM  Cuff Pressure (cm H2O) 28 cm H2O 06/26/2013  7:51 AM  Site Condition Dry 06/26/2013  7:51 AM    Evaluation  O2 sats: stable throughout Complications: No apparent complications Patient did tolerate procedure well. Bilateral Breath Sounds: Clear;Diminished Suctioning: Oral;Airway Yes  Elsie Stain 06/26/2013, 8:30 AM

## 2013-06-27 ENCOUNTER — Encounter (HOSPITAL_COMMUNITY): Payer: Self-pay | Admitting: Adult Health

## 2013-06-27 DIAGNOSIS — N183 Chronic kidney disease, stage 3 unspecified: Secondary | ICD-10-CM

## 2013-06-27 DIAGNOSIS — G894 Chronic pain syndrome: Secondary | ICD-10-CM

## 2013-06-27 DIAGNOSIS — I5022 Chronic systolic (congestive) heart failure: Secondary | ICD-10-CM

## 2013-06-27 DIAGNOSIS — I2510 Atherosclerotic heart disease of native coronary artery without angina pectoris: Secondary | ICD-10-CM

## 2013-06-27 DIAGNOSIS — J9600 Acute respiratory failure, unspecified whether with hypoxia or hypercapnia: Principal | ICD-10-CM

## 2013-06-27 DIAGNOSIS — G9340 Encephalopathy, unspecified: Secondary | ICD-10-CM

## 2013-06-27 LAB — CBC
HEMATOCRIT: 35 % — AB (ref 39.0–52.0)
Hemoglobin: 11.5 g/dL — ABNORMAL LOW (ref 13.0–17.0)
MCH: 28.5 pg (ref 26.0–34.0)
MCHC: 32.9 g/dL (ref 30.0–36.0)
MCV: 86.6 fL (ref 78.0–100.0)
Platelets: 211 10*3/uL (ref 150–400)
RBC: 4.04 MIL/uL — ABNORMAL LOW (ref 4.22–5.81)
RDW: 14 % (ref 11.5–15.5)
WBC: 5.6 10*3/uL (ref 4.0–10.5)

## 2013-06-27 LAB — BASIC METABOLIC PANEL
BUN: 37 mg/dL — ABNORMAL HIGH (ref 6–23)
BUN: 37 mg/dL — ABNORMAL HIGH (ref 6–23)
CALCIUM: 9.4 mg/dL (ref 8.4–10.5)
CO2: 27 meq/L (ref 19–32)
CO2: 26 meq/L (ref 19–32)
CREATININE: 2.2 mg/dL — AB (ref 0.50–1.35)
Calcium: 9.2 mg/dL (ref 8.4–10.5)
Chloride: 103 mEq/L (ref 96–112)
Chloride: 100 mEq/L (ref 96–112)
Creatinine, Ser: 2.14 mg/dL — ABNORMAL HIGH (ref 0.50–1.35)
GFR calc Af Amer: 37 mL/min — ABNORMAL LOW (ref >90)
GFR calc non Af Amer: 31 mL/min — ABNORMAL LOW (ref >90)
GFR calc non Af Amer: 32 mL/min — ABNORMAL LOW (ref >90)
GFR, EST AFRICAN AMERICAN: 36 mL/min — AB (ref >90)
GLUCOSE: 203 mg/dL — AB (ref 70–99)
Glucose, Bld: 133 mg/dL — ABNORMAL HIGH (ref 70–99)
Potassium: 3.7 mEq/L (ref 3.7–5.3)
Potassium: 3.9 mEq/L (ref 3.7–5.3)
Sodium: 145 mEq/L (ref 137–147)
Sodium: 139 mEq/L (ref 137–147)

## 2013-06-27 LAB — GLUCOSE, CAPILLARY
GLUCOSE-CAPILLARY: 172 mg/dL — AB (ref 70–99)
Glucose-Capillary: 133 mg/dL — ABNORMAL HIGH (ref 70–99)
Glucose-Capillary: 154 mg/dL — ABNORMAL HIGH (ref 70–99)
Glucose-Capillary: 170 mg/dL — ABNORMAL HIGH (ref 70–99)
Glucose-Capillary: 250 mg/dL — ABNORMAL HIGH (ref 70–99)

## 2013-06-27 MED ORDER — CLINDAMYCIN HCL 150 MG PO CAPS
300.0000 mg | ORAL_CAPSULE | Freq: Three times a day (TID) | ORAL | Status: DC
  Administered 2013-06-27 – 2013-06-28 (×4): 300 mg via ORAL
  Filled 2013-06-27 (×6): qty 2

## 2013-06-27 MED ORDER — ASPIRIN EC 81 MG PO TBEC
81.0000 mg | DELAYED_RELEASE_TABLET | Freq: Every day | ORAL | Status: DC
  Administered 2013-06-27 – 2013-06-28 (×2): 81 mg via ORAL
  Filled 2013-06-27 (×2): qty 1

## 2013-06-27 MED ORDER — HYDRALAZINE HCL 10 MG PO TABS
10.0000 mg | ORAL_TABLET | Freq: Three times a day (TID) | ORAL | Status: DC
  Administered 2013-06-27 – 2013-06-28 (×3): 10 mg via ORAL
  Filled 2013-06-27 (×5): qty 1

## 2013-06-27 MED ORDER — DIPHENHYDRAMINE HCL 25 MG PO CAPS
25.0000 mg | ORAL_CAPSULE | Freq: Once | ORAL | Status: AC
  Administered 2013-06-28: 25 mg via ORAL
  Filled 2013-06-27: qty 1

## 2013-06-27 MED ORDER — PANTOPRAZOLE SODIUM 40 MG PO TBEC
40.0000 mg | DELAYED_RELEASE_TABLET | Freq: Every day | ORAL | Status: DC
  Administered 2013-06-27 – 2013-06-28 (×2): 40 mg via ORAL
  Filled 2013-06-27 (×2): qty 1

## 2013-06-27 NOTE — Care Management Note (Addendum)
    Page 1 of 2   06/28/2013     12:05:37 PM   CARE MANAGEMENT NOTE 06/28/2013  Patient:  [name],[name]   Account Number:  [id number]  Date Initiated:  06/27/2013  Documentation initiated by:  Theophilus Kinds  Subjective/Objective Assessment:   Pt admitted from home with respiratory failure and was intubated. Pt now extubated and fully alert and oriented. Pt lives with his wife and wants to return home at discharge. Pt has an electric w/x and walker fo rhome use along with cpap     Action/Plan:   machine. Pts wife stated that she would like him to go to [facility] for rehab at discharge. PT consult obtained. Pt not sure if he is connected with [facility]. CM called and left message and will send requested information to [place] once   Anticipated DC Date:  06/28/2013   Anticipated DC Plan:  [place]  CM consult      [facility] Choice  Resumption Of Svcs/PTA Provider   Choice offered to / List presented to:  C-1 Patient        [place] arranged  HH-1 RN  [facility]      [facility]   Status of service:  Completed, signed off Medicare Important Message given?  YES (If response is "NO", the following Medicare IM given date fields will be blank) Date Medicare IM given:  06/28/2013 Date Additional Medicare IM given:    Discharge Disposition:  [place]  Per UR Regulation:    If discussed at Long Length of Stay Meetings, dates discussed:    Comments:  06/28/13 [place], RN BSN CM Pt discharged home today with resumption of [place] services (per pts choice). Pt already active with PT and OT and now will have RN, CSW, and aide as well. Rhett Bannister of [facility] is aware and will collect the pts information from the chart. [place] services to start within 48 hours of discharge. Pt has all DME needed. Mechele Claude, CSW with [facility] clinic  will arrange follow up with pts pain doctor in [place] and call pt with appt time. Pt and pts nurse aware of discharge arrangements.  06/27/13 [place], RN BSN CM [facility] returns call. Pt also has PCP at [facility]. Will continue to follow pt for discharge planning needs.

## 2013-06-27 NOTE — Progress Notes (Signed)
Subjective: He was able to be successfully extubated yesterday and is doing well. He does have sleep apnea.  Objective: Vital signs in last 24 hours: Temp:  [98 F (36.7 C)-99 F (37.2 C)] 98.2 F (36.8 C) (03/03 0400) Pulse Rate:  [25-161] 25 (03/03 0801) Resp:  [6-24] 24 (03/03 0801) BP: (104-186)/(50-130) 155/65 mmHg (03/03 0600) SpO2:  [71 %-100 %] 96 % (03/03 0801) Weight:  [91.8 kg (202 lb 6.1 oz)] 91.8 kg (202 lb 6.1 oz) (03/03 0500) Weight change: 0.5 kg (1 lb 1.6 oz) Last BM Date: 06/23/13  Intake/Output from previous day: 03/02 0701 - 03/03 0700 In: 650 [P.O.:480; I.V.:20; IV Piggyback:150] Out: 2300 [Urine:2300]  PHYSICAL EXAM General appearance: alert, cooperative and mild distress Resp: rhonchi bilaterally Cardio: regular rate and rhythm, S1, S2 normal, no murmur, click, rub or gallop GI: soft, non-tender; bowel sounds normal; no masses,  no organomegaly Extremities: extremities normal, atraumatic, no cyanosis or edema  Lab Results:    Basic Metabolic Panel:  Recent Labs  06/26/13 0513 06/27/13 0502  NA 149* 145  K 3.4* 3.7  CL 106 103  CO2 30 27  GLUCOSE 177* 133*  BUN 47* 37*  CREATININE 2.96* 2.20*  CALCIUM 8.9 9.2   Liver Function Tests: No results found for this basename: AST, ALT, ALKPHOS, BILITOT, PROT, ALBUMIN,  in the last 72 hours No results found for this basename: LIPASE, AMYLASE,  in the last 72 hours No results found for this basename: AMMONIA,  in the last 72 hours CBC:  Recent Labs  06/26/13 0513 06/27/13 0502  WBC 6.5 5.6  HGB 11.4* 11.5*  HCT 34.9* 35.0*  MCV 88.4 86.6  PLT 208 211   Cardiac Enzymes: No results found for this basename: CKTOTAL, CKMB, CKMBINDEX, TROPONINI,  in the last 72 hours BNP: No results found for this basename: PROBNP,  in the last 72 hours D-Dimer: No results found for this basename: DDIMER,  in the last 72 hours CBG:  Recent Labs  06/26/13 0733 06/26/13 1118 06/26/13 1616 06/26/13 1958  06/27/13 0003 06/27/13 0743  GLUCAP 159* 109* 163* 188* 154* 133*   Hemoglobin A1C: No results found for this basename: HGBA1C,  in the last 72 hours Fasting Lipid Panel: No results found for this basename: CHOL, HDL, LDLCALC, TRIG, CHOLHDL, LDLDIRECT,  in the last 72 hours Thyroid Function Tests: No results found for this basename: TSH, T4TOTAL, FREET4, T3FREE, THYROIDAB,  in the last 72 hours Anemia Panel: No results found for this basename: VITAMINB12, FOLATE, FERRITIN, TIBC, IRON, RETICCTPCT,  in the last 72 hours Coagulation: No results found for this basename: LABPROT, INR,  in the last 72 hours Urine Drug Screen: Drugs of Abuse     Component Value Date/Time   LABOPIA NONE DETECTED 06/23/2013 1432   COCAINSCRNUR NONE DETECTED 06/23/2013 1432   LABBENZ NONE DETECTED 06/23/2013 1432   AMPHETMU NONE DETECTED 06/23/2013 1432   THCU NONE DETECTED 06/23/2013 1432   LABBARB NONE DETECTED 06/23/2013 1432    Alcohol Level: No results found for this basename: ETH,  in the last 72 hours Urinalysis:  Recent Labs  06/26/13 1631  COLORURINE YELLOW  LABSPEC 1.025  PHURINE 6.5  GLUCOSEU NEGATIVE  HGBUR TRACE*  BILIRUBINUR NEGATIVE  KETONESUR NEGATIVE  PROTEINUR 30*  UROBILINOGEN 0.2  NITRITE NEGATIVE  LEUKOCYTESUR SMALL*   Misc. Labs:  ABGS  Recent Labs  06/26/13 0500  PHART 7.482*  PO2ART 87.7  TCO2 24.8  HCO3 27.6*   CULTURES Recent Results (from  the past 240 hour(s))  CULTURE, BLOOD (ROUTINE X 2)     Status: None   Collection Time    06/23/13  2:38 PM      Result Value Ref Range Status   Specimen Description LEFT ANTECUBITAL   Final   Special Requests BOTTLES DRAWN AEROBIC AND ANAEROBIC 8CC   Final   Culture NO GROWTH 3 DAYS   Final   Report Status PENDING   Incomplete  CULTURE, BLOOD (ROUTINE X 2)     Status: None   Collection Time    06/23/13  2:43 PM      Result Value Ref Range Status   Specimen Description BLOOD LEFT ARM   Final   Special Requests  BOTTLES DRAWN AEROBIC AND ANAEROBIC 10CC   Final   Culture NO GROWTH 3 DAYS   Final   Report Status PENDING   Incomplete  MRSA PCR SCREENING     Status: None   Collection Time    06/23/13  5:47 PM      Result Value Ref Range Status   MRSA by PCR NEGATIVE  NEGATIVE Final   Comment:            The GeneXpert MRSA Assay (FDA     approved for NASAL specimens     only), is one component of a     comprehensive MRSA colonization     surveillance program. It is not     intended to diagnose MRSA     infection nor to guide or     monitor treatment for     MRSA infections.   Studies/Results: Dg Chest Port 1 View  06/26/2013   CLINICAL DATA:  Respiratory failure  EXAM: PORTABLE CHEST - 1 VIEW  COMPARISON:  06/24/2013  FINDINGS: Partial clearing of right lung infiltrating.  Improved aeration in the left lower lobe. Negative for pleural effusion.  Endotracheal tube remains in good position.  IMPRESSION: Improved aeration since 06/24/2013. Decrease in bilateral airspace disease   Electronically Signed   By: Franchot Gallo M.D.   On: 06/26/2013 08:07    Medications:  Prior to Admission:  Prescriptions prior to admission  Medication Sig Dispense Refill  . atorvastatin (LIPITOR) 20 MG tablet Take 20 mg by mouth daily.      . baclofen (LIORESAL) 10 MG tablet Take 5 mg by mouth 4 (four) times daily.      Marland Kitchen buPROPion (WELLBUTRIN) 100 MG tablet Take 100 mg by mouth 2 (two) times daily.      . carvedilol (COREG) 6.25 MG tablet Take 1 tablet (6.25 mg total) by mouth 2 (two) times daily with a meal.  60 tablet  0  . clonazePAM (KLONOPIN) 0.5 MG tablet Take 0.5-1 mg by mouth 2 (two) times daily as needed for anxiety (1 in the morning and 2 at bedtime).      . fentaNYL (DURAGESIC - DOSED MCG/HR) 100 MCG/HR Place 100 mcg onto the skin every 3 (three) days.      Marland Kitchen FLUoxetine (PROZAC) 20 MG capsule Take 80 mg by mouth daily.      . furosemide (LASIX) 40 MG tablet Take 1 tablet (40 mg total) by mouth 2 (two) times  daily.      Marland Kitchen gabapentin (NEURONTIN) 100 MG capsule Take 300 mg by mouth 3 (three) times daily.      . hydrALAZINE (APRESOLINE) 10 MG tablet Take 1 tablet (10 mg total) by mouth every 8 (eight) hours.  90 tablet  0  .  HYDROmorphone (DILAUDID) 4 MG tablet Take 4 mg by mouth every 6 (six) hours as needed.      . insulin aspart protamine-insulin aspart (NOVOLOG 70/30) (70-30) 100 UNIT/ML injection Inject 10-20 Units into the skin 2 (two) times daily with a meal. Units vary based on food consumed.      . isosorbide mononitrate (IMDUR) 30 MG 24 hr tablet Take 15 mg by mouth daily.      Marland Kitchen lisinopril (PRINIVIL,ZESTRIL) 5 MG tablet Take 1 tablet (5 mg total) by mouth daily.  30 tablet  0  . metolazone (ZAROXOLYN) 2.5 MG tablet Take 1 tablet (2.5 mg total) by mouth daily as needed (for increased swelling. After taking a dose, call you cardiologist for further instructions.).  30 tablet  0  . modafinil (PROVIGIL) 200 MG tablet Take 100 mg by mouth 2 (two) times daily.      . Multiple Vitamins-Minerals (CENTRUM SILVER ULTRA MENS) TABS Take 1 tablet by mouth daily.        Marland Kitchen NITROSTAT 0.4 MG SL tablet Place 1 tablet under the tongue every 5 (five) minutes as needed for chest pain.       Marland Kitchen ondansetron (ZOFRAN-ODT) 4 MG disintegrating tablet Take 1 tablet (4 mg total) by mouth every 4 (four) hours as needed for nausea.  20 tablet  0  . oxyCODONE-acetaminophen (PERCOCET/ROXICET) 5-325 MG per tablet Take 1 tablet by mouth every 8 (eight) hours as needed for severe pain.      . pantoprazole (PROTONIX) 40 MG tablet Take 40 mg by mouth daily.      . polyethylene glycol (MIRALAX / GLYCOLAX) packet Take 17 g by mouth daily as needed. For constipation      . rOPINIRole (REQUIP) 3 MG tablet Take 6 mg by mouth at bedtime.      . senna (SENOKOT) 8.6 MG TABS tablet Take 2 tablets by mouth 2 (two) times daily.       Scheduled: . antiseptic oral rinse  15 mL Mouth Rinse QID  . buPROPion  100 mg Oral BID  . carvedilol  6.25  mg Oral BID WC  . chlorhexidine  15 mL Mouth Rinse BID  . FLUoxetine  80 mg Oral Daily  . furosemide  40 mg Oral BID  . heparin  5,000 Units Subcutaneous 3 times per day  . insulin aspart  0-9 Units Subcutaneous TID WC  . insulin detemir  15 Units Subcutaneous QHS  . pantoprazole  40 mg Oral Daily  . piperacillin-tazobactam (ZOSYN)  IV  3.375 g Intravenous Q8H  . [START ON 06/28/2013] vancomycin  1,250 mg Intravenous Q36H   Continuous:  PZW:CHENIDPOE, clonazePAM, diphenhydrAMINE-zinc acetate, metolazone  Assesment: He was admitted with acute respiratory failure with hypoxia. He had altered mental status and this may have been from medications for pain. He has a history of chronic heart failure as well. It appeared that he has aspiration pneumonia. He has sleep apnea and uses CPAP at home. He is much improved and was able to come off the ventilator support yesterday. Principal Problem:   Acute respiratory failure with hypoxia Active Problems:   CKD (chronic kidney disease), stage III   OSA on CPAP   DM type 2 with diabetic peripheral neuropathy   Polypharmacy   Chronic constipation   Acute encephalopathy   Acute systolic heart failure   Aspiration pneumonia   Chronic pain syndrome    Plan: Continue with current treatments. I will plan to sign off.    LOS:  4 days   [name] L 06/27/2013, 8:18 AM

## 2013-06-27 NOTE — Progress Notes (Signed)
PROGRESS NOTE  RAYVION STUMPH ZSW:[id number] DOB: 1955-11-04 DOA: 06/23/2013 PCP: Annia Belt, MD  Summary: 58 year old man with history of chronic systolic congestive heart failure with EF 20-25%, chronic pain, polypharmacy with recurrent episodes of acute encephalopathy re-admitted [facility] 06/23/13 s/p recent hospital stay 05/30/13-06/04/13 c increasing somnolence and confusion, by report pain medication had been recently adjusted.  On arrival the patient was found to have two 130mcg Fenatyl patches on.  + hypoxic and was intubated in the emergency department for acute hypoxic respiratory failure thought secondary to acute systolic heart failure, possible pneumonia (elevated white blood cell count and asymmetric infiltrate on chest x-ray) and suspected narcotic-induced encephalopathy.  Assessment/Plan: 1. Acute multifactorial hypoxic respiratory failure, s/p intubation/MV, now extubated 3/2., history most suggestive of narcotic-induced encephalopathy/sedation, aspiration, possibly acute on chronic systolic heart failure.  2. Acute encephalopathy. No focal neurologic deficits, follow commands. HPI suggest residual polypharmacy effect. Numerous hospitalizations for similar symptoms (see progress note 2/7 for summary), conclusion in past has been polypharmacy. Based on admission history misuse should also be considered-Will call Pain MD to get close follow up visit. 3. Polypharmacy, chronic pain. Listed on admission: Baclofen QID, Klonopin BID PRN, fentanyl 100 mcg patch Q72, Percocet, oral Dilaudid every 6 as needed.   4. Possible aspiration pneumonia. Leukocytosis has resolved. Empiric Vanc/Zosyn started 2/27  Oral clindamycin 300 tid 06/27/13-Stop date 7 days = 06/30/13 5. Possible acute on chronic systolic congestive heart failure, EF 20-25% no significant change since last echocardiogram. Appears to be compensated at this point.  Is improved, -9.9 liters urine.  Cont oral Lasix 40 bid, continue Metolazone 2.5  daily prn.  Cont Coreg 6.25 bid 6. Diabetes mellitus ty 2 complicated by neuropathy, gastroparesis . CBG 133-154.  Cont levemir 15 daily, Sensitive coverage 7. Obstructive sleep apnea. BiPAP each night.  OP follow up and titration of Machine 8. Chronic back pain. Close followup will be arranged  Called Dr. Rockie Neighbours MD] Office in [place] [phone] to arrange-Not on Home Gabapentin or other PTA Meds currently 9. Bipolar-meds as per #12 10. Chronic kidney disease stage III. Appears to be very close to baseline. 11. Anemia-borderline microcytic.  Momitor 12. Tobacco dependence 13. Unspecified myoclonic disorder continue Klonopin 0.5 bid  PRN, Bupropion 100 bid, Prozac 80 daily   Extubated 06/26/13, respiratory status does appear to be improving. Oral clinda for pneumonia-->watch fever curve over 24 hours, CXR 3/4/am ordered,  Continue chronic treatment for heart failure which appears to be stabilized at this point. [facility] input  Med reconciliation prior to discharge, coordinate with his pain specialist. Pain control--may be very difficult to control without causing oversedation.  Stop Baclofen--patient denied being on this medication last admission-Meds apparently were recently transitioned  Code Status: full code DVT prophylaxis: heparin Family Communication: none present Disposition Plan: trasnfer Tele.  ? D/c 1-2 days  Verneita Griffes, MD [facility] [phone]   06/27/2013, 8:05 AM  LOS: 4 days   Consultants:  Pulmonology  Procedures:  2-D echocardiogram LVEF 20-25%. Diffuse hypokinesis. Grade 1 diastolic dysfunction.  Antibiotics:  Zosyn 2/27 >> 3/3  Vancomycin 2/27 >> 3/3  Clinda 3/3---->expected stop date 3/6   Speech Therapy notes 06/26/13 Clinical Impression  Mr. Prasad shows no overt signs or symptoms consistent with aspiration with consistencies/textures presented. He does demonstrate some impulsivity with intake, however he reports being very  thirsty/hungry. He also seemed fixated on looking out the window into the parking lot looking for his wife. Pt did cough occasionally after  po given, but did not seem related to po intake. Recommed regular diet with intermittent supervision due to impulsivity. SLP will follow up for diet tolerance.  Aspiration Risk  Mild  Diet Recommendation Regular;Thin liquid  Liquid Administration via: Cup  Medication Administration: Whole meds with liquid  Supervision: Intermittent supervision to cue for compensatory strategies (due to impulsivity)  Compensations: Small sips/bites  Postural Changes and/or Swallow Maneuvers: Seated upright 90 degrees;Out of bed for meals;Upright 30-60 min after meal    HPI/Subjective:  Alert, pleasant, doesn't like breakfast Ambulatory, NO Cp, N/V/SOb Mild cough no sputum  Objective: Filed Vitals:   06/27/13 0500 06/27/13 0600 06/27/13 0705 06/27/13 0801  BP: 163/76 155/65    Pulse: 75 77 72 25  Temp:      TempSrc:      Resp: 11 14 12 24   Height:      Weight: 91.8 kg (202 lb 6.1 oz)     SpO2: 98% 92% 95% 96%    Intake/Output Summary (Last 24 hours) at 06/27/13 0805 Last data filed at 06/27/13 0600  Gross per 24 hour  Intake    640 ml  Output   2300 ml  Net  -1660 ml     Filed Weights   06/25/13 0500 06/26/13 0500 06/27/13 0500  Weight: 91.1 kg (200 lb 13.4 oz) 91.3 kg (201 lb 4.5 oz) 91.8 kg (202 lb 6.1 oz)    Exam:   Afebrile, vital signs are stable. Normotensive. Normal respiratory rate. Poor dentition  Gen. Appears somewhat confused but not toxic. Appears calm and comfortable.  Psychiatric. Appears confused. Difficult to assess mood and affect. He is oriented  Cardiovascular. Regular rate and rhythm. No murmur, rub or gallop. No lower extremity edema.  Respiratory. Clear to auscultation bilaterally. No wheezes, rales or rhonchi. Normal respiratory effort.  Abdomen soft, nontender, nondistended.  Eyes: Pupils equal, round, reactive to  light. Normal lids, irises.  ENT: Grossly normal lips and tongue. Oropharynx appears unremarkable.  Abdomen soft nontender nondistended.   Scheduled Meds: . antiseptic oral rinse  15 mL Mouth Rinse QID  . buPROPion  100 mg Oral BID  . carvedilol  6.25 mg Oral BID WC  . chlorhexidine  15 mL Mouth Rinse BID  . FLUoxetine  80 mg Oral Daily  . furosemide  40 mg Oral BID  . heparin  5,000 Units Subcutaneous 3 times per day  . insulin aspart  0-9 Units Subcutaneous TID WC  . insulin detemir  15 Units Subcutaneous QHS  . pantoprazole (PROTONIX) IV  40 mg Intravenous QHS  . piperacillin-tazobactam (ZOSYN)  IV  3.375 g Intravenous Q8H  . [START ON 06/28/2013] vancomycin  1,250 mg Intravenous Q36H   Continuous Infusions:    Principal Problem:   Acute respiratory failure with hypoxia Active Problems:   CKD (chronic kidney disease), stage III   OSA on CPAP   DM type 2 with diabetic peripheral neuropathy   Polypharmacy   Chronic constipation   Acute encephalopathy   Acute systolic heart failure   Aspiration pneumonia   Chronic pain syndrome   Time spent 40 minutes

## 2013-06-27 NOTE — Progress Notes (Signed)
The patient is receiving Protonix by the intravenous route.  Based on criteria approved by the Pharmacy and [place], the medication is being converted to the equivalent oral dose form.  These criteria include: -No Active GI bleeding -Able to tolerate diet of full liquids (or better) or tube feeding OR able to tolerate other medications by the oral or enteral route  If you have any questions about this conversion, please contact the Pharmacy Department (ext [phone]).  Thank you.  Biagio Borg, [facility] 06/27/2013 8:05 AM

## 2013-06-27 NOTE — Consult Note (Signed)
CARDIOLOGY CONSULT NOTE   Patient ID: [name] MRN: [id number] DOB/AGE: 1956-04-16 58 y.o.  Admit Date: 06/23/2013 Referring Physician: PTH Primary Physician: Annia Belt, MD Consulting Cardiologist: [name]  Primary Cardiologist: States that he follows at a [place] clinic in [facility] Reason for Consultation: History of cardiomyopathy, possible acute on chronic systolic heart failure  Clinical Summary [name] is a medically complex 58 y.o.male with known history of NICM, LVEF of 20-25%, hypertension, IDDM, CKD, and OSA admitted with acute hypoxic respiratory failure requiring mechanical ventilation. Etiology felt to be multifactorial and suggestive of narcotic-induced encephalopathy/sedation with potential aspiration and perhaps a component of acute on chronic systolic heart failure. He was recently discharged 06/04/2013 for similar situation. 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 He was found to have 2 Fentanyl patches on his body in ER.  Sat's were 80% and he was found to be in respiratory distress. Pro-BNP 9,222. He was also found to have aspiration pneumonia. He was intubated in ER and has since been extubated on 06/26/2013.  On my examination this morning he is alert and oriented. He states that he follows at a [place] clinic in [place] for his general and cardiac care. Also sees a pain specialist in [place].      Most recent echocardiogram from last month is noted below. ECG shows no significant changes.   Allergies  Allergen Reactions  . Ms Contin [Morphine Sulfate Er]     Doesn't like to take in liquid form( burns and doesn't help) (pill form works fine)     Medications Scheduled Medications: . antiseptic oral rinse  15 mL Mouth Rinse QID  . buPROPion  100 mg Oral BID  . carvedilol  6.25 mg Oral BID WC  . chlorhexidine  15 mL Mouth Rinse BID  . clindamycin  300 mg Oral 3 times per day  . FLUoxetine  80 mg Oral Daily  . furosemide  40  mg Oral BID  . heparin  5,000 Units Subcutaneous 3 times per day  . insulin aspart  0-9 Units Subcutaneous TID WC  . insulin detemir  15 Units Subcutaneous QHS  . pantoprazole  40 mg Oral Daily    PRN Medications: albuterol, clonazePAM, diphenhydrAMINE-zinc acetate, metolazone   Past Medical History  Diagnosis Date  . Type 2 diabetes mellitus   . Essential hypertension, benign   . 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  . CKD (chronic kidney disease) stage 3, GFR 30-59 ml/min   . Arthritis   . Obesity   . Depression   . Sleep apnea   . Diabetic neuropathy   . Coronary atherosclerosis of native coronary artery     Nonobstructive by cardiac catheterization September 2008  . Esophagitis, reflux 07/07/10    MW tear, erosive reflux esophagitis, 2cm hh  . S/P colonoscopy April 2012    Rourk: torturous colon, hyperplastic polyps  . Tremor   . Diverticulum   . Hiatal hernia   . Gastroparesis 11/2010    No emptying at two hours.   . Chronic back pain   . Clonic seizure     Controlled with klonodine    Past Surgical History  Procedure Laterality Date  . Neck surgery      Discectomy  . Hand surgery      Rght-carpal tunnel  . Colonoscopy  before 2002    [place]  . Appendectomy    . Other surgical  history      Bone graft-knee  . Anterior lumbar corpectomy w/ fusion      C5-6/C4-7  . Colonoscopy  08/21/2010    Elongaed tortuous colon otherwise normal  . Esophagogastroduodenoscopy  07/08/10    Dr. Volney American esophageal erosions and excoriations consistent with erosive reflux esophagitis, hiatal hernia  . Knee arthroscopy      Right  . Esophagogastroduodenoscopy (egd) with propofol  02/25/2012    Procedure: ESOPHAGOGASTRODUODENOSCOPY (EGD) WITH PROPOFOL;  Surgeon: Daneil Dolin, MD;  Location: [facility];  Service: Endoscopy;  Laterality: N/A;  7:30 /POLYPHARMACY    Family History  Problem Relation Age of Onset  . Stroke  Father 88  . Stroke Mother 34  . Colon cancer Neg Hx   . Crohn's disease Neg Hx   . Cirrhosis Neg Hx   . Ulcerative colitis Neg Hx   . Stomach cancer Neg Hx     Social History [name] reports that he has been smoking Cigarettes.  He has a 20 pack-year smoking history. He has never used smokeless tobacco. [name] reports that he does not drink alcohol.  Review of Systems Reports no chest pain or palpitations. States his breathing is typically NYHA class II. Reports chronic pain. No orthopnea or PND. As outlined above.  Physical Examination Blood pressure 131/94, pulse 25, temperature 98.6 F (37 C), temperature source Oral, resp. rate 24, height 6' (1.829 m), weight 202 lb 6.1 oz (91.8 kg), SpO2 96.00%.  Intake/Output Summary (Last 24 hours) at 06/27/13 0940 Last data filed at 06/27/13 0730  Gross per 24 hour  Intake    630 ml  Output   2100 ml  Net  -1470 ml    Telemetry: Sinus rhythm with PVCs.  Chronically ill-appearing male, no distress. HEENT: Conjunctiva and lids normal, oropharynx clear. Neck: Supple, no elevated JVP or carotid bruits, no thyromegaly. Lungs: Decreased breath sounds, clear overall,, nonlabored breathing at rest. Cardiac: Indistinct PMI, regular rate and rhythm, no S3 or significant systolic murmur, no pericardial rub. Abdomen: Soft, nontender, bowel sounds present. Extremities: No pitting edema, distal pulses 2+. Skin: Warm and dry. Musculoskeletal: No kyphosis. Neuropsychiatric: Alert and oriented x3, affect grossly appropriate.   Echocardiogram (06/24/13): Left ventricle: The cavity size was mildly to moderately dilated. Wall thickness was normal. Systolic function was severely reduced. The estimated ejection fraction was in the range of 20% to 25%. Diffuse hypokinesis. There is akinesis of the basalinferoseptal myocardium. Doppler parameters are consistent with abnormal left ventricular relaxation (grade 1 diastolic dysfunction). -  Ventricular septum: Septal motion showed abnormal function and dyssynergy. - Aortic valve: Mildly calcified annulus. Trileaflet. No significant regurgitation. - Mitral valve: Calcified annulus. No significant regurgitation. - Left atrium: The atrium was moderately dilated. - Tricuspid valve: Trivial regurgitation. - Pulmonary arteries: Systolic pressure could not be accurately estimated. - Inferior vena cava: Not visualized. Unable to estimate CVP. - Pericardium, extracardiac: There was no pericardial effusion.   Lab Results  Basic Metabolic Panel:  Recent Labs Lab 06/23/13 1301 06/24/13 0542 06/25/13 0437 06/26/13 0513 06/27/13 0502  NA 146 148* 146 149* 145  K 3.9 3.3* 3.0* 3.4* 3.7  CL 103 109 103 106 103  CO2 30 28 29 30 27   GLUCOSE 228* 85 106* 177* 133*  BUN 39* 32* 36* 47* 37*  CREATININE 2.49* 2.29* 2.51* 2.96* 2.20*  CALCIUM 9.3 8.9 9.2 8.9 9.2    CBC:  Recent Labs Lab 06/23/13 1301 06/24/13 0542 06/25/13 0437 06/26/13 0513 06/27/13 0502  WBC 20.6* 13.5* 9.2 6.5 5.6  HGB 13.1 11.6* 11.4* 11.4* 11.5*  HCT 40.3 35.0* 34.5* 34.9* 35.0*  MCV 88.6 88.6 88.0 88.4 86.6  PLT 232 206 209 208 211    Cardiac Enzymes:  Recent Labs Lab 06/23/13 1438  TROPONINI <0.30    Radiology: Dg Chest Port 1 View  06/26/2013   CLINICAL DATA:  Respiratory failure  EXAM: PORTABLE CHEST - 1 VIEW  COMPARISON:  06/24/2013  FINDINGS: Partial clearing of right lung infiltrating.  Improved aeration in the left lower lobe. Negative for pleural effusion.  Endotracheal tube remains in good position.  IMPRESSION: Improved aeration since 06/24/2013. Decrease in bilateral airspace disease   Electronically Signed   By: Franchot Gallo M.D.   On: 06/26/2013 08:07    ECG: Sinus rhythm with IVCD, poor R-wave progression, PVC.  Impression  1. Patient presenting with acute hypoxic respiratory failure with depressed mental status, multifactorial as detailed above suspect to be related  to narcotic-induced encephalopathy/sedation with possible aspiration. May be some component of acute on chronic systolic heart failure, but has clinically improved following extubation, stable this morning.  2. History of nonischemic cardiomyopathy, LVEF 20-25% by recent assessment.  3. Prior documented history of nonobstructive coronary artery disease, no angina symptoms. ECG is stable.  4. Chronic pain, polypharmacy noted as per Dr. Everett Graff note on 3/2. Patient states he sees a pain specialist in [place].  5. Ongoing tobacco abuse, cessation recommended.  6. Type 2 diabetes mellitus with history of gastric pierces and neuropathy.  7. CKD, stage 3.   Recommendations  Current medications include Coreg, Lasix, subcutaneous heparin. Would add low-dose aspirin. Depending on blood pressure, might be considered for combination hydralazine/nitrate for afterload reduction, avoiding ACE inhibitor and ARB with chronic kidney disease. Does not appear that he will require further cardiac testing at this time. Suggest postextubation chest x-ray, further management by primary team  He will need to keep cardiology followup with his regular provider is in [place].   Signed: Phill Myron. Purcell Nails NP [facility] 06/27/2013, 9:40 AM Co-Sign MD

## 2013-06-27 NOTE — Progress Notes (Signed)
Inpatient Diabetes Program Recommendations  AACE/ADA: New Consensus Statement on Inpatient Glycemic Control (2013)  Target Ranges:  Prepandial:   less than 140 mg/dL      Peak postprandial:   less than 180 mg/dL (1-2 hours)      Critically ill patients:  140 - 180 mg/dL   Results for AVEON, COLQUHOUN (MRN [id number]) as of 06/27/2013 12:35  Ref. Range 06/26/2013 07:33 06/26/2013 11:18 06/26/2013 16:16 06/26/2013 19:58 06/27/2013 00:03 06/27/2013 07:43 06/27/2013 11:36  Glucose-Capillary Latest Range: 70-99 mg/dL 159 (H) 109 (H) 163 (H) 188 (H) 154 (H) 133 (H) 170 (H)   Diabetes history: DM2 Outpatient Diabetes medications: 70/30 10-20 units BID (based on food consumption) Current orders for Inpatient glycemic control: Levemir 15 units QHS, Novolog 0-9 units AC  Inpatient Diabetes Program Recommendations Diet: Please consider ordering Carb Modified Diabetic diet.  Currently ordered Regular diet.  Thanks, Barnie Alderman, RN, MSN, CCRN Diabetes Coordinator Inpatient Diabetes Program [phone] (Team Pager) [phone] ([facility] office) [phone] [facility] office)

## 2013-06-27 NOTE — Progress Notes (Signed)
Pt a/o.vss. IV patent and intact. O2 on 4L/[place]. Up to chair during shift. Report called to Janett Billow, Therapist, sports. Pt to be transferred to room 309.

## 2013-06-28 ENCOUNTER — Encounter (HOSPITAL_COMMUNITY): Payer: Self-pay | Admitting: Internal Medicine

## 2013-06-28 ENCOUNTER — Inpatient Hospital Stay (HOSPITAL_COMMUNITY): Payer: Medicare HMO

## 2013-06-28 DIAGNOSIS — Z720 Tobacco use: Secondary | ICD-10-CM | POA: Diagnosis present

## 2013-06-28 DIAGNOSIS — R892 Abnormal level of other drugs, medicaments and biological substances in specimens from other organs, systems and tissues: Secondary | ICD-10-CM

## 2013-06-28 LAB — COMPREHENSIVE METABOLIC PANEL
ALT: 12 U/L (ref 0–53)
AST: 19 U/L (ref 0–37)
Albumin: 3.4 g/dL — ABNORMAL LOW (ref 3.5–5.2)
Alkaline Phosphatase: 79 U/L (ref 39–117)
BILIRUBIN TOTAL: 0.6 mg/dL (ref 0.3–1.2)
BUN: 33 mg/dL — AB (ref 6–23)
CHLORIDE: 99 meq/L (ref 96–112)
CO2: 26 mEq/L (ref 19–32)
CREATININE: 1.96 mg/dL — AB (ref 0.50–1.35)
Calcium: 9.3 mg/dL (ref 8.4–10.5)
GFR calc non Af Amer: 36 mL/min — ABNORMAL LOW (ref >90)
GFR, EST AFRICAN AMERICAN: 41 mL/min — AB (ref >90)
Glucose, Bld: 164 mg/dL — ABNORMAL HIGH (ref 70–99)
Potassium: 3.2 mEq/L — ABNORMAL LOW (ref 3.7–5.3)
Sodium: 139 mEq/L (ref 137–147)
TOTAL PROTEIN: 7.7 g/dL (ref 6.0–8.3)

## 2013-06-28 LAB — CULTURE, BLOOD (ROUTINE X 2)
CULTURE: NO GROWTH
Culture: NO GROWTH

## 2013-06-28 LAB — CBC
HEMATOCRIT: 39.1 % (ref 39.0–52.0)
Hemoglobin: 13.2 g/dL (ref 13.0–17.0)
MCH: 28.8 pg (ref 26.0–34.0)
MCHC: 33.8 g/dL (ref 30.0–36.0)
MCV: 85.4 fL (ref 78.0–100.0)
Platelets: 229 10*3/uL (ref 150–400)
RBC: 4.58 MIL/uL (ref 4.22–5.81)
RDW: 13.4 % (ref 11.5–15.5)
WBC: 8.8 10*3/uL (ref 4.0–10.5)

## 2013-06-28 LAB — GLUCOSE, CAPILLARY
Glucose-Capillary: 157 mg/dL — ABNORMAL HIGH (ref 70–99)
Glucose-Capillary: 190 mg/dL — ABNORMAL HIGH (ref 70–99)

## 2013-06-28 MED ORDER — POTASSIUM CHLORIDE ER 20 MEQ PO TBCR: 20.0000 meq | EXTENDED_RELEASE_TABLET | Freq: Every day | ORAL | Status: DC

## 2013-06-28 MED ORDER — CLINDAMYCIN HCL 300 MG PO CAPS: 300.0000 mg | ORAL_CAPSULE | Freq: Three times a day (TID) | ORAL | Status: DC

## 2013-06-28 MED ORDER — ROPINIROLE HCL 3 MG PO TABS: 3.0000 mg | ORAL_TABLET | Freq: Every day | ORAL | Status: DC

## 2013-06-28 MED ORDER — HYDRALAZINE HCL 25 MG PO TABS
25.0000 mg | ORAL_TABLET | Freq: Three times a day (TID) | ORAL | Status: DC
  Administered 2013-06-28: 25 mg via ORAL
  Filled 2013-06-28: qty 1

## 2013-06-28 MED ORDER — HYDRALAZINE HCL 25 MG PO TABS: 25.0000 mg | ORAL_TABLET | Freq: Three times a day (TID) | ORAL | Status: DC

## 2013-06-28 MED ORDER — POTASSIUM CHLORIDE CRYS ER 20 MEQ PO TBCR
40.0000 meq | EXTENDED_RELEASE_TABLET | Freq: Two times a day (BID) | ORAL | Status: AC
  Administered 2013-06-28: 40 meq via ORAL
  Filled 2013-06-28: qty 2

## 2013-06-28 NOTE — Progress Notes (Signed)
Patient states understanding of discharge instructions, prescriptions given 

## 2013-06-28 NOTE — Progress Notes (Signed)
Patient requested something to help him sleep.  Notified MD and will follow order to give one time dose of Benedryl.  Will continue to monitor patient.

## 2013-06-28 NOTE — Progress Notes (Signed)
Consulting cardiologist: Rozann Lesches MD  Subjective:    Feeling better. Wants to go home soon once home PT is in place.   Objective:   Temp:  [98 F (36.7 C)-98.3 F (36.8 C)] 98.2 F (36.8 C) (03/04 0415) Pulse Rate:  [63-97] 97 (03/04 0840) Resp:  [11-18] 18 (03/04 0415) BP: (161-175)/(84-92) 166/92 mmHg (03/04 0415) SpO2:  [96 %-97 %] 97 % (03/04 0840) Weight:  [194 lb 7.1 oz (88.2 kg)-197 lb 15.6 oz (89.8 kg)] 194 lb 7.1 oz (88.2 kg) (03/04 0415) Last BM Date: 06/27/13  Filed Weights   06/27/13 0500 06/27/13 1508 06/28/13 0415  Weight: 202 lb 6.1 oz (91.8 kg) 197 lb 15.6 oz (89.8 kg) 194 lb 7.1 oz (88.2 kg)    Intake/Output Summary (Last 24 hours) at 06/28/13 0925 Last data filed at 06/28/13 0848  Gross per 24 hour  Intake    360 ml  Output   2475 ml  Net  -2115 ml    Telemetry: NSR  Exam:  General: No acute distress.  Lungs: Mild bibasilar crackles. No wheezes.  Cardiac: RRR, no gallop or rub.   Extremities: No pitting edema, distal pulses full.   Lab Results:  Basic Metabolic Panel:  Recent Labs Lab 06/27/13 0502 06/27/13 1012 06/28/13 0528  NA 145 139 139  K 3.7 3.9 3.2*  CL 103 100 99  CO2 27 26 26   GLUCOSE 133* 203* 164*  BUN 37* 37* 33*  CREATININE 2.20* 2.14* 1.96*  CALCIUM 9.2 9.4 9.3    Liver Function Tests:  Recent Labs Lab 06/28/13 0528  AST 19  ALT 12  ALKPHOS 79  BILITOT 0.6  PROT 7.7  ALBUMIN 3.4*    CBC:  Recent Labs Lab 06/26/13 0513 06/27/13 0502 06/28/13 0528  WBC 6.5 5.6 8.8  HGB 11.4* 11.5* 13.2  HCT 34.9* 35.0* 39.1  MCV 88.4 86.6 85.4  PLT 208 211 229    Cardiac Enzymes:  Recent Labs Lab 06/23/13 1438  TROPONINI <0.30    Radiology: Dg Chest 2 View  06/28/2013   CLINICAL DATA:  Possible aspiration  EXAM: CHEST  2 VIEW  COMPARISON:  06/26/2013  FINDINGS: Cardiomediastinal silhouette is stable. Previous endotracheal tube has been removed. Spinal stimulation wires are again noted. No  acute infiltrate or pleural effusion. No pulmonary edema. Stable mild degenerative changes mid thoracic spine.  IMPRESSION: No active cardiopulmonary disease.   Electronically Signed   By: Lahoma Crocker M.D.   On: 06/28/2013 08:08     Medications:   Scheduled Medications: . antiseptic oral rinse  15 mL Mouth Rinse QID  . aspirin EC  81 mg Oral Daily  . buPROPion  100 mg Oral BID  . carvedilol  6.25 mg Oral BID WC  . chlorhexidine  15 mL Mouth Rinse BID  . clindamycin  300 mg Oral 3 times per day  . FLUoxetine  80 mg Oral Daily  . furosemide  40 mg Oral BID  . heparin  5,000 Units Subcutaneous 3 times per day  . hydrALAZINE  10 mg Oral 3 times per day  . insulin aspart  0-9 Units Subcutaneous TID WC  . insulin detemir  15 Units Subcutaneous QHS  . pantoprazole  40 mg Oral Daily    PRN Medications: albuterol, clonazePAM, diphenhydrAMINE-zinc acetate, metolazone   Assessment and Plan:   1. NICM with LVEF of 20-25%: He is breathing better, and lungs have cleared. He remains on coreg 6.25 mg BID, Lasix 40 mg  daily with 8 lb wt loss. He is tolerating hydralazine 10 mg TID with BP labile, 354'T to 625' systolic per review of trends. Creatinine 1.9. He wishes to follow up with [place] instead of [facility] for cardiology management. Post hospital appt will be arranged.   2. Hypertension: Labile, averaging in the 638;L systolic. With reduced EF will increase hydralazine dose to 25 mg TID, as his pressures have been primarily at rest, or while in the bed. He will be increasing activity with PT at home. Will have orthostatics completed with increased dose.  3. Hypokalemia: This is repleted today. Check Mg.  4. Ongoing tobacco abuse: Cessation strongly recommended.  5. Deconditioning: PT inpt and as OP at patients request. Will have him be OOB in in the chair today.   Phill Myron. Purcell Nails NP [facility] 06/28/2013, 9:25 AM   Attending note:  Patient seen and  examined. Cardiac status has been stable, he has tolerated the addition of hydralazine. Additional regimen includes aspirin, Coreg, Lasix. He will likely need a low-dose potassium supplement as an outpatient as well, we can always consider adding nitrate to complete hydralazine/nitrate combination os an outpatient. It seems that he would now like to followup with our cardiology practice. Compliance will be the key. Still needs to maintain regular outpatient followup with his primary care and pain management providers. We will not be addressing either of these issues.  Satira Sark, M.D., F.A.C.C.

## 2013-06-28 NOTE — Progress Notes (Signed)
Patient is refusing to wear CPAP.  Patient is wearing nasal cannula while sleeping.  Will continue to monitor.

## 2013-06-28 NOTE — Evaluation (Signed)
Physical Therapy Evaluation Patient Details Name: [name] MRN: [id number] DOB: 01/04/1956 Today's Date: 06/28/2013 Time: 9563-8756 PT Time Calculation (min): 25 min  PT Assessment / Plan / Recommendation History of Present Illness  Pt admitted with systolic heart failure with an EF of 20%, diabetes type 2 insulin-dependent with a hemoglobin A1c of 6.8, chronic back pain currently on narcotics was brought to the emergency department by family  for altered mental status  Clinical Impression  Pt is a 58 yo male who normally uses a four wheel walker to ambulate for short distances and uses a motorized wheelchair when he needs to go long distances.  MM test of LE shows normal strength.  Pt I in bed mobility. Ambulated with rolling walker for 200 ft with no decrease of O2 stats.  Pt appears to be at prior functional level not therapy recommended.    PT Assessment  Patent does not need any further PT services    Follow Up Recommendations  No PT follow up    Does the patient have the potential to tolerate intense rehabilitation    N/A  Barriers to Discharge  none      Equipment Recommendations    none   Recommendations for Other Services  none   Frequency  N/A    Precautions / Restrictions Precautions Precautions: None Restrictions Weight Bearing Restrictions: No   Pertinent Vitals/Pain none      Mobility  Bed Mobility Overal bed mobility: Independent Transfers Overall transfer level: Independent Ambulation/Gait Ambulation/Gait assistance: Modified independent (Device/Increase time) Ambulation Distance (Feet): 200 Feet Assistive device: Rolling walker (2 wheeled) Gait velocity interpretation: at or above normal speed for age/gender         PT Goals(Current goals can be found in the care plan section) Acute Rehab PT Goals PT Goal Formulation: With patient  Visit Information  Last PT Received On: 06/28/13 History of Present Illness: Pt admitted with systolic  heart failure with an EF of 20%, diabetes type 2 insulin-dependent with a hemoglobin A1c of 6.8, chronic back pain currently on narcotics was brought to the emergency department by family  for altered mental status       Prior [place] expects to be discharged to:: Private residence Living Arrangements: Spouse/significant other Available Help at Discharge: Family Type of Home: Mobile home Home Access: Ramped entrance [place]: One level Home Equipment: Bedside commode;Walker - 4 wheels;Wheelchair - power Prior Function Level of Independence: Independent with assistive device(s) Communication Communication: No difficulties    Cognition  Cognition Arousal/Alertness: Awake/alert Overall Cognitive Status: Within Functional Limits for tasks assessed    Extremity/Trunk Assessment Lower Extremity Assessment Lower Extremity Assessment: Overall WFL for tasks assessed   Balance    End of Session PT - End of Session Equipment Utilized During Treatment: Gait belt Activity Tolerance: Patient tolerated treatment well Patient left: in chair;with call bell/phone within reach Nurse Communication: Mobility status  GP     Zhane Bluitt,CINDY 06/28/2013, 10:47 AM

## 2013-06-28 NOTE — Discharge Summary (Signed)
Physician Discharge Summary  [name] [id number] DOB: [birth year] DOA: 06/23/2013  PCP: Annia Belt, MD  Admit date: 06/23/2013 Discharge date: 06/28/2013  Time spent: Greater than 30 minutes  Recommendations for Outpatient Follow-up:  1. Home health physical therapy, home health aide, registered nurse, and social worker were ordered at the time of discharge. 2. The patient was referred back to his pain physician for further evaluation and management of his chronic opiates. 3. Recommend rechecking the patient's renal function and serum potassium at his followup appointments.  Discharge Diagnoses:  1.Acute encephalopathy, secondary to opiate toxicity. (The patient had inadvertently placed another fentanyl patch on without taking the previous one off). 2. Acute respiratory failure with hypoxia, secondary to opiate toxicity, plus/minus congestive heart failure. Intubated and successfully extubated. 3. Acute on chronic systolic heart failure. 4. Possible aspiration pneumonia. 5. Restrictive/obstructive sleep apnea, on chronic BiPAP. 6. Tobacco abuse, the patient was advised to stop smoking. 7. Stage III chronic kidney disease. 8. Type 2 diabetes mellitus with diabetic peripheral neuropathy. 9. Chronic constipation. 10. Chronic pain syndrome, on chronic opiate medications. (BACLOFEN IS NOT ONE OF HIS CHRONIC MEDICATIONS AND THEREFORE IT WAS TAKEN OFF OF HIS MEDICATION LIST.) 11. Previous hospitalizations for altered mental status associated with chronic sedated with medications. 12. Polypharmacy.  Discharge Condition: Improved.  Diet: Heart healthy, carbohydrate modified.  Filed Weights   06/27/13 0500 06/27/13 1508 06/28/13 0415  Weight: 91.8 kg (202 lb 6.1 oz) 89.8 kg (197 lb 15.6 oz) 88.2 kg (194 lb 7.1 oz)    History of present illness:   The patient is a 58 year old man with a history of  systolic heart failure with an EF of 20%, diabetes type 2 insulin-dependent with a  hemoglobin A1c of 6.8, chronic back pain on narcotics who was brought to the emergency department by family for altered mental status. Recently his pain medicine was increased and his family reported that his been more confused and more difficult to arouse over the past several days. On arrival to the emergency room, the patient was found to have 2 fentanyl patches on. These were removed.  In the ED, he then develop hypoxia on 4 L with an O2 sat < 80% and in respiratory distress.BNP > 2000. Lactic acid 0.9 and a WBC 20K  Cr at baseline. He was admitted for further evaluation and management.   Hospital Course:   Acute hypoxic respiratory failure. The patient was intubated in the emergency department and transferred to the ICU. Both fentanyl patches were removed. Pulmonologist, Dr. Luan Pulling was consulted to assist with ventilator management. For treatment of possible aspiration pneumonia and acute on chronic heart failure, vancomycin, Zosyn, and Lasix were started respectively. He diuresed well. His chest x-ray improved. He was successfully extubated. Following extubation, his oxygen saturations were maintained in the low to mid 90s. His respiratory failure was likely multifactorial, but most suggestive of narcotic-induced encephalopathy/sedation, aspiration, and possibly acute on chronic systolic heart failure.   Acute encephalopathy. The acute encephalopathy was secondary to inadvertent opiate toxicity. Following treatment of his acute respiratory failure, his encephalopathy completely resolved. No focal neurologic deficits.   Polypharmacy, chronic pain.  Medications listed on admission included: Baclofen QID, Klonopin BID PRN, fentanyl 100 mcg patch Q72, Percocet, oral Dilaudid every 6 as needed. However, the patient does not take baclofen. It was discontinued after it was initially started. The patient has a history of numerous hospitalizations for polypharmacy effects. He is followed by chronic pain  physician, Dr. Wess Botts. Rather than  micromanage his chronic opiates, he was specifically instructed to make sure the other fentanyl patch has taken off before the new one is placed. He voiced understanding.  Possible aspiration pneumonia. The patient's chest x-ray on admission was suggestive of an infiltrate and/or asymmetric edema. He was initially started on cefepime but it was changed to Zosyn. He was also given vancomycin. His white blood cell count which was elevated on admission, resolved. He remained afebrile. He was discharged on several more days of clindamycin.   Possible acute on chronic systolic congestive heart failure,  Patient was started on IV Lasix for an elevated proBNP and a chest x-ray suggestive of possible asymmetric edema. Another 2-D echocardiogram was ordered and it revealed an ejection fraction of 20-25% and grade 1 diastolic dysfunction, not significantly changed from the previous. Following IV Lasix, his chest x-ray cleared. He was resumed on all of his chronic oral medications and Lasix was changed to by mouth. Cardiology was consulted. They agreed with medical management.   Diabetes mellitus, neuropathy, gastroparesis .  The patient was treated with Lantus and sliding-scale NovoLog during hospitalization. He was instructed to resume 70/30 insulin which is his chronic regimen at home.   Chronic kidney disease stage III.  The patient's renal function was close to baseline throughout the hospitalization. His creatinine was 1.96 at the time of discharge.  Hypokalemia.  The patient's serum potassium was 3.2 at the time of discharge. He was discharged on potassium chloride supplementation for one more week. He will need his renal function and serum potassium reassessed at his followup appointments.   Tobacco abuse. The patient was strongly advised to stop smoking. Appears to be very close to baseline. Tobacco dependence    Procedures: 2-D echocardiogram on 06/24/2013.: Left  ventricle: The cavity size was mildly to moderately dilated. Wall thickness was normal. Systolic function was severely reduced. The estimated ejection fraction was in the range of 20% to 25%. Diffuse hypokinesis. There is akinesis of the basalinferoseptal myocardium. Doppler parameters are consistent with abnormal left ventricular relaxation (grade 1 diastolic dysfunction). - Ventricular septum: Septal motion showed abnormal function and dyssynergy. - Aortic valve: Mildly calcified annulus. Trileaflet. No significant regurgitation. - Mitral valve: Calcified annulus. No significant regurgitation. - Left atrium: The atrium was moderately dilated. - Tricuspid valve: Trivial regurgitation. - Pulmonary arteries: Systolic pressure could not be accurately estimated. - Inferior vena cava: Not visualized. Unable to estimate CVP. - Pericardium, extracardiac: There was no pericardial effusion.    Consultations:  Pulmonology  Cardiology  Discharge Exam: Filed Vitals:   06/28/13 0840  BP:   Pulse: 97  Temp:   Resp:    temperature 98.6. Pulse 97. Respiratory rate 24. Blood pressure 175/88. Oxygen saturation 97%.   General: the patient was seen ambulating with physical therapy. He is alert and oriented and in no acute distress. Cardiovascular: S1, S2, no murmurs rubs or gallops. Respiratory: rare crackles, but breathing is nonlabored.  Discharge Instructions  Discharge Orders   Future Appointments Provider Department Dept Phone   07/12/2013 2:10 PM Lendon Colonel, NP [facility] [phone]   Future Orders Complete By Expires   Diet - low sodium heart healthy  As directed    Discharge instructions  As directed    Comments:     Take medications as prescribed.   Increase activity slowly  As directed        Medication List    STOP taking these medications       baclofen 10  MG tablet  Commonly known as:  LIORESAL      TAKE these medications        atorvastatin 20 MG tablet  Commonly known as:  LIPITOR  Take 20 mg by mouth daily.     buPROPion 100 MG tablet  Commonly known as:  WELLBUTRIN  Take 100 mg by mouth 2 (two) times daily.     carvedilol 6.25 MG tablet  Commonly known as:  COREG  Take 1 tablet (6.25 mg total) by mouth 2 (two) times daily with a meal.     CENTRUM SILVER ULTRA MENS Tabs  Take 1 tablet by mouth daily.     clindamycin 300 MG capsule  Commonly known as:  CLEOCIN  Take 1 capsule (300 mg total) by mouth every 8 (eight) hours. Antibiotic to be taken for 3 more days.     clonazePAM 0.5 MG tablet  Commonly known as:  KLONOPIN  Take 0.5-1 mg by mouth 2 (two) times daily as needed for anxiety (1 in the morning and 2 at bedtime).     DILAUDID 4 MG tablet  Generic drug:  HYDROmorphone  Take 4 mg by mouth every 6 (six) hours as needed.     fentaNYL 100 MCG/HR  Commonly known as:  DURAGESIC - dosed mcg/hr  Place 100 mcg onto the skin every 3 (three) days.     FLUoxetine 20 MG capsule  Commonly known as:  PROZAC  Take 80 mg by mouth daily.     furosemide 40 MG tablet  Commonly known as:  LASIX  Take 1 tablet (40 mg total) by mouth 2 (two) times daily.     gabapentin 100 MG capsule  Commonly known as:  NEURONTIN  Take 300 mg by mouth 3 (three) times daily.     hydrALAZINE 25 MG tablet  Commonly known as:  APRESOLINE  Take 1 tablet (25 mg total) by mouth every 8 (eight) hours.     insulin aspart protamine- aspart (70-30) 100 UNIT/ML injection  Commonly known as:  NOVOLOG MIX 70/30  Inject 10-20 Units into the skin 2 (two) times daily with a meal. Units vary based on food consumed.     isosorbide mononitrate 30 MG 24 hr tablet  Commonly known as:  IMDUR  Take 15 mg by mouth daily.     lisinopril 5 MG tablet  Commonly known as:  PRINIVIL,ZESTRIL  Take 1 tablet (5 mg total) by mouth daily.     metolazone 2.5 MG tablet  Commonly known as:  ZAROXOLYN  Take 1 tablet (2.5 mg total) by mouth daily as  needed (for increased swelling. After taking a dose, call you cardiologist for further instructions.).     modafinil 200 MG tablet  Commonly known as:  PROVIGIL  Take 100 mg by mouth 2 (two) times daily.     NITROSTAT 0.4 MG SL tablet  Generic drug:  nitroGLYCERIN  Place 1 tablet under the tongue every 5 (five) minutes as needed for chest pain.     ondansetron 4 MG disintegrating tablet  Commonly known as:  ZOFRAN-ODT  Take 1 tablet (4 mg total) by mouth every 4 (four) hours as needed for nausea.     oxyCODONE-acetaminophen 5-325 MG per tablet  Commonly known as:  PERCOCET/ROXICET  Take 1 tablet by mouth every 8 (eight) hours as needed for severe pain.     pantoprazole 40 MG tablet  Commonly known as:  PROTONIX  Take 40 mg by mouth daily.  polyethylene glycol packet  Commonly known as:  MIRALAX / GLYCOLAX  Take 17 g by mouth daily as needed. For constipation     Potassium Chloride ER 20 MEQ Tbcr  Take 20 mEq by mouth daily. Take potassium chloride supplement for 7 more days.     rOPINIRole 3 MG tablet  Commonly known as:  REQUIP  Take 1 tablet (3 mg total) by mouth at bedtime.     senna 8.6 MG Tabs tablet  Commonly known as:  SENOKOT  Take 2 tablets by mouth 2 (two) times daily.       Allergies  Allergen Reactions  . Ms Contin [Morphine Sulfate Er]     Doesn't like to take in liquid form( burns and doesn't help) (pill form works fine)        Follow-up Information   Follow up with Jory Sims, NP On 07/12/2013. (2:10 pm)    Specialty:  Nurse Practitioner   Contact information:   [place] [address] [phone]       Follow up with [facility].   Specialty:  Home Health Services   Contact information:   [place] [phone]        The results of significant diagnostics from this hospitalization (including imaging, microbiology, ancillary and laboratory) are listed below for reference.     Significant Diagnostic Studies: Dg Chest 1 View  06/23/2013   CLINICAL DATA:  Altered mental status.  EXAM: CHEST - 1 VIEW  COMPARISON:  PA and lateral chest 05/06/2012 and 05/30/2013.  FINDINGS: There is cardiomegaly. Alveolar and interstitial opacities are worse on the right with more focal opacity seen in the right upper and lower lung zones. No pneumothorax or pleural fluid.  IMPRESSION: Cardiomegaly and pulmonary edema. More focal airspace opacity in the right upper and lower lung zones likely represents foci of more intense edema rather than pneumonia.   Electronically Signed   By: Inge Rise M.D.   On: 06/23/2013 15:20   Dg Chest 2 View  06/28/2013   CLINICAL DATA:  Possible aspiration  EXAM: CHEST  2 VIEW  COMPARISON:  06/26/2013  FINDINGS: Cardiomediastinal silhouette is stable. Previous endotracheal tube has been removed. Spinal stimulation wires are again noted. No acute infiltrate or pleural effusion. No pulmonary edema. Stable mild degenerative changes mid thoracic spine.  IMPRESSION: No active cardiopulmonary disease.   Electronically Signed   By: Lahoma Crocker M.D.   On: 06/28/2013 08:08   Dg Chest 2 View  05/30/2013   CLINICAL DATA:  Cough, shortness of breath.  EXAM: CHEST  2 VIEW  COMPARISON:  12/08/2012  FINDINGS: Diffuse interstitial prominence throughout the lungs. Mild peribronchial thickening, stable. Heart is normal size. No effusions or acute bony abnormality. Spinal stimulator device noted over the mid thoracic spine.  IMPRESSION: Interstitial prominence throughout the lungs, with peribronchial thickening. Suspect chronic interstitial lung disease/ bronchitis.   Electronically Signed   By: Rolm Baptise M.D.   On: 05/30/2013 20:54   Ct Head Wo Contrast  06/23/2013   CLINICAL DATA:  Altered level of consciousness.  EXAM: CT HEAD WITHOUT CONTRAST  TECHNIQUE: Contiguous axial images were obtained from the base of the skull through the vertex without intravenous contrast.   COMPARISON:  MR HEAD W/O CM dated 12/09/2012; CT HEAD W/O CM dated 12/08/2012  FINDINGS: Image quality is degraded by motion. No evidence of an acute infarct, acute hemorrhage, mass lesion, mass effect or hydrocephalus. Mild atrophy. Visualized  portions of the paranasal sinuses and mastoid air cells are clear.  IMPRESSION: 1. Image quality is degraded by motion. 2. No definite acute intracranial abnormality. 3. Mild atrophy.   Electronically Signed   By: Lorin Picket M.D.   On: 06/23/2013 14:07   Dg Chest Port 1 View  06/26/2013   CLINICAL DATA:  Respiratory failure  EXAM: PORTABLE CHEST - 1 VIEW  COMPARISON:  06/24/2013  FINDINGS: Partial clearing of right lung infiltrating.  Improved aeration in the left lower lobe. Negative for pleural effusion.  Endotracheal tube remains in good position.  IMPRESSION: Improved aeration since 06/24/2013. Decrease in bilateral airspace disease   Electronically Signed   By: Franchot Gallo M.D.   On: 06/26/2013 08:07   Dg Chest Port 1 View  06/24/2013   CLINICAL DATA:  Intubated  EXAM: PORTABLE CHEST - 1 VIEW  COMPARISON:  the previous day's study  FINDINGS: Endotracheal tube and nasogastric tube stable in position. Cervical fixation hardware partially seen. Patchy airspace opacities throughout the right lung have partially improved. Patchy airspace best is at the left lung base slightly more conspicuous. Heart size upper limits normal. . No effusion. Dorsal stimulator leads project over the lower thoracic spine.  IMPRESSION: Asymmetric airspace disease right greater than left, slightly improved since previous exam.  Support hardware stable in position.   Electronically Signed   By: Arne Cleveland M.D.   On: 06/24/2013 07:22   Dg Chest Port 1v Same Day  06/23/2013   CLINICAL DATA:  Intubation  EXAM: PORTABLE CHEST - 1 VIEW SAME DAY  COMPARISON:  Portable exam 1609 hr compared earlier study of 15/11 hr  FINDINGS: Endotracheal tube tip projects 5.2 cm above carinal.   Nasogastric tube extends into stomach.  Enlargement of cardiac silhouette with pulmonary vascular congestion.  Asymmetric infiltrate throughout right lung versus left may represent asymmetric edema or infection.  No pleural effusion or pneumothorax.  Prior cervical spine fusion.  Bones otherwise unremarkable.  IMPRESSION: Endotracheal nasogastric tube positions as above.  Enlargement of cardiac silhouette with pulmonary vascular congestion and asymmetric pulmonary infiltrates right greater than left question asymmetric edema versus infection.   Electronically Signed   By: Lavonia Dana M.D.   On: 06/23/2013 16:22    Microbiology: Recent Results (from the past 240 hour(s))  CULTURE, BLOOD (ROUTINE X 2)     Status: None   Collection Time    06/23/13  2:38 PM      Result Value Ref Range Status   Specimen Description BLOOD LEFT ANTECUBITAL   Final   Special Requests BOTTLES DRAWN AEROBIC AND ANAEROBIC 8CC   Final   Culture NO GROWTH 5 DAYS   Final   Report Status 06/28/2013 FINAL   Final  CULTURE, BLOOD (ROUTINE X 2)     Status: None   Collection Time    06/23/13  2:43 PM      Result Value Ref Range Status   Specimen Description BLOOD LEFT ARM   Final   Special Requests BOTTLES DRAWN AEROBIC AND ANAEROBIC 10CC   Final   Culture NO GROWTH 5 DAYS   Final   Report Status 06/28/2013 FINAL   Final  MRSA PCR SCREENING     Status: None   Collection Time    06/23/13  5:47 PM      Result Value Ref Range Status   MRSA by PCR NEGATIVE  NEGATIVE Final   Comment:            The  GeneXpert MRSA Assay (FDA     approved for NASAL specimens     only), is one component of a     comprehensive MRSA colonization     surveillance program. It is not     intended to diagnose MRSA     infection nor to guide or     monitor treatment for     MRSA infections.     Labs: Basic Metabolic Panel:  Recent Labs Lab 06/25/13 0437 06/26/13 0513 06/27/13 0502 06/27/13 1012 06/28/13 0528  NA 146 149* 145 139 139   K 3.0* 3.4* 3.7 3.9 3.2*  CL 103 106 103 100 99  CO2 29 30 27 26 26   GLUCOSE 106* 177* 133* 203* 164*  BUN 36* 47* 37* 37* 33*  CREATININE 2.51* 2.96* 2.20* 2.14* 1.96*  CALCIUM 9.2 8.9 9.2 9.4 9.3   Liver Function Tests:  Recent Labs Lab 06/28/13 0528  AST 19  ALT 12  ALKPHOS 79  BILITOT 0.6  PROT 7.7  ALBUMIN 3.4*   No results found for this basename: LIPASE, AMYLASE,  in the last 168 hours  Recent Labs Lab 06/23/13 1302  AMMONIA 33   CBC:  Recent Labs Lab 06/24/13 0542 06/25/13 0437 06/26/13 0513 06/27/13 0502 06/28/13 0528  WBC 13.5* 9.2 6.5 5.6 8.8  HGB 11.6* 11.4* 11.4* 11.5* 13.2  HCT 35.0* 34.5* 34.9* 35.0* 39.1  MCV 88.6 88.0 88.4 86.6 85.4  PLT 206 209 208 211 229   Cardiac Enzymes:  Recent Labs Lab 06/23/13 1438  TROPONINI <0.30   BNP: BNP (last 3 results)  Recent Labs  05/30/13 2025 06/23/13 1602 06/24/13 0542  PROBNP 9807.0* 2103.0* 9222.0*   CBG:  Recent Labs Lab 06/27/13 1136 06/27/13 1635 06/27/13 2000 06/28/13 0714 06/28/13 1109  GLUCAP 170* 172* 250* 157* 190*       Signed:  Merrik Puebla  [facility] 06/28/2013, 2:51 PM

## 2013-06-29 NOTE — Progress Notes (Signed)
UR chart review completed.  

## 2013-07-12 ENCOUNTER — Encounter: Payer: Medicare HMO | Admitting: Adult Health

## 2013-07-12 NOTE — Progress Notes (Signed)
HPI: [name] is a 58 year old patient of Dr. Pennelope Bracken h/o nonischemic cardiomyopathy, HTN, diabetes mellitus, CKD, sleep apnea, and GERD. He most recently saw his cardiologist on 05/24/13, and this note documents the patient underwent a "normal cath" at [facility] with  He also reportedly underwent a Lexiscan nuclear perfusion study prior to coronary angiography on 06/10/2012 which showed the following:  1. Inducible ischemic changes in the inferior wall and and to a lesser extent the apex with stress. 2. Enlarged left ventricle. The left ventricular ejection fraction = 37%.  He was admitted to [facility] in the setting of acute hypoxic respiratory failure, requiring intubation. He was treated with aspiration pneumonia, acute on chronic systolic heart failure, and diuresis with Lasix. He was noted to have acute encephalopathy secondary to opiate toxicity.    Echocardiogram was completed during hospitalization which revealed an EF of 2025% with grade 1 diastolic dysfunction, not significantly changed from previous. After.IV diuresis, he was transitioned to by mouth Lasix. He was also noted to have chronic kidney disease stage III, with discharge creatinine of 1.96. Was also found during hospitalization to be hypokalemic due to diuresis, potassium was 3.2 discharge and he was provided with potassium supplements.   Allergies  Allergen Reactions  . Ms Contin [Morphine Sulfate Er]     Doesn't like to take in liquid form( burns and doesn't help) (pill form works fine)     Current Outpatient Prescriptions  Medication Sig Dispense Refill  . atorvastatin (LIPITOR) 20 MG tablet Take 20 mg by mouth daily.      Marland Kitchen buPROPion (WELLBUTRIN) 100 MG tablet Take 100 mg by mouth 2 (two) times daily.      . carvedilol (COREG) 6.25 MG tablet Take 1 tablet (6.25 mg total) by mouth 2 (two) times daily with a meal.  60 tablet  0  . clindamycin (CLEOCIN) 300 MG capsule Take 1 capsule (300 mg total) by mouth  every 8 (eight) hours. Antibiotic to be taken for 3 more days.  9 capsule  0  . clonazePAM (KLONOPIN) 0.5 MG tablet Take 0.5-1 mg by mouth 2 (two) times daily as needed for anxiety (1 in the morning and 2 at bedtime).      . fentaNYL (DURAGESIC - DOSED MCG/HR) 100 MCG/HR Place 100 mcg onto the skin every 3 (three) days.      Marland Kitchen FLUoxetine (PROZAC) 20 MG capsule Take 80 mg by mouth daily.      . furosemide (LASIX) 40 MG tablet Take 1 tablet (40 mg total) by mouth 2 (two) times daily.      Marland Kitchen gabapentin (NEURONTIN) 100 MG capsule Take 300 mg by mouth 3 (three) times daily.      . hydrALAZINE (APRESOLINE) 25 MG tablet Take 1 tablet (25 mg total) by mouth every 8 (eight) hours.  90 tablet  3  . HYDROmorphone (DILAUDID) 4 MG tablet Take 4 mg by mouth every 6 (six) hours as needed.      . insulin aspart protamine-insulin aspart (NOVOLOG 70/30) (70-30) 100 UNIT/ML injection Inject 10-20 Units into the skin 2 (two) times daily with a meal. Units vary based on food consumed.      . isosorbide mononitrate (IMDUR) 30 MG 24 hr tablet Take 15 mg by mouth daily.      Marland Kitchen lisinopril (PRINIVIL,ZESTRIL) 5 MG tablet Take 1 tablet (5 mg total) by mouth daily.  30 tablet  0  . metolazone (ZAROXOLYN) 2.5 MG tablet Take 1 tablet (2.5  mg total) by mouth daily as needed (for increased swelling. After taking a dose, call you cardiologist for further instructions.).  30 tablet  0  . modafinil (PROVIGIL) 200 MG tablet Take 100 mg by mouth 2 (two) times daily.      . Multiple Vitamins-Minerals (CENTRUM SILVER ULTRA MENS) TABS Take 1 tablet by mouth daily.        Marland Kitchen NITROSTAT 0.4 MG SL tablet Place 1 tablet under the tongue every 5 (five) minutes as needed for chest pain.       Marland Kitchen ondansetron (ZOFRAN-ODT) 4 MG disintegrating tablet Take 1 tablet (4 mg total) by mouth every 4 (four) hours as needed for nausea.  20 tablet  0  . oxyCODONE-acetaminophen (PERCOCET/ROXICET) 5-325 MG per tablet Take 1 tablet by mouth every 8 (eight) hours as  needed for severe pain.      . pantoprazole (PROTONIX) 40 MG tablet Take 40 mg by mouth daily.      . polyethylene glycol (MIRALAX / GLYCOLAX) packet Take 17 g by mouth daily as needed. For constipation      . potassium chloride 20 MEQ TBCR Take 20 mEq by mouth daily. Take potassium chloride supplement for 7 more days.  7 tablet  0  . rOPINIRole (REQUIP) 3 MG tablet Take 1 tablet (3 mg total) by mouth at bedtime.      . senna (SENOKOT) 8.6 MG TABS tablet Take 2 tablets by mouth 2 (two) times daily.       No current facility-administered medications for this visit.    Past Medical History  Diagnosis Date  . Type 2 diabetes mellitus   . Essential hypertension, benign   . 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  . CKD (chronic kidney disease) stage 3, GFR 30-59 ml/min   . Arthritis   . Obesity   . Depression   . Sleep apnea   . Diabetic neuropathy   . Coronary atherosclerosis of native coronary artery     Nonobstructive by cardiac catheterization September 2008  . Esophagitis, reflux 07/07/10    MW tear, erosive reflux esophagitis, 2cm hh  . S/P colonoscopy April 2012    Rourk: torturous colon, hyperplastic polyps  . Tremor   . Diverticulum   . Hiatal hernia   . Gastroparesis 11/2010    No emptying at two hours.   . Chronic back pain   . Clonic seizure     Controlled with klonodine  . Acute respiratory failure with hypoxia 06/23/2013    Due to opiate toxicity. Intubated & extubated    Past Surgical History  Procedure Laterality Date  . Neck surgery      Discectomy  . Hand surgery      Rght-carpal tunnel  . Colonoscopy  before 2002    [place]  . Appendectomy    . Other surgical history      Bone graft-knee  . Anterior lumbar corpectomy w/ fusion      C5-6/C4-7  . Colonoscopy  08/21/2010    Elongaed tortuous colon otherwise normal  . Esophagogastroduodenoscopy  07/08/10    Dr. Volney American esophageal erosions and  excoriations consistent with erosive reflux esophagitis, hiatal hernia  . Knee arthroscopy      Right  . Esophagogastroduodenoscopy (egd) with propofol  02/25/2012    Procedure: ESOPHAGOGASTRODUODENOSCOPY (EGD) WITH PROPOFOL;  Surgeon: Daneil Dolin, MD;  Location: [facility];  Service: Endoscopy;  Laterality: N/A;  7:30 /POLYPHARMACY  ROS: PHYSICAL EXAM There were no vitals taken for this visit.  EKG:  ASSESSMENT AND PLAN

## 2013-11-27 ENCOUNTER — Inpatient Hospital Stay: Payer: Self-pay | Admitting: Internal Medicine

## 2013-11-27 LAB — URINALYSIS, COMPLETE
Bacteria: NONE SEEN
Bilirubin,UR: NEGATIVE
Blood: NEGATIVE
Glucose,UR: NEGATIVE mg/dL (ref 0–75)
Hyaline Cast: 5
Ketone: NEGATIVE
Leukocyte Esterase: NEGATIVE
Nitrite: NEGATIVE
PROTEIN: NEGATIVE
Ph: 5 (ref 4.5–8.0)
RBC,UR: 3 /HPF (ref 0–5)
Specific Gravity: 1.009 (ref 1.003–1.030)
WBC UR: 6 /HPF (ref 0–5)

## 2013-11-27 LAB — COMPREHENSIVE METABOLIC PANEL
ALK PHOS: 113 U/L
ANION GAP: 10 (ref 7–16)
AST: 19 U/L (ref 15–37)
Albumin: 3.2 g/dL — ABNORMAL LOW (ref 3.4–5.0)
BUN: 56 mg/dL — ABNORMAL HIGH (ref 7–18)
Bilirubin,Total: 0.4 mg/dL (ref 0.2–1.0)
CALCIUM: 8.4 mg/dL — AB (ref 8.5–10.1)
CREATININE: 3.55 mg/dL — AB (ref 0.60–1.30)
Chloride: 101 mmol/L (ref 98–107)
Co2: 26 mmol/L (ref 21–32)
EGFR (African American): 21 — ABNORMAL LOW
EGFR (Non-African Amer.): 18 — ABNORMAL LOW
Glucose: 193 mg/dL — ABNORMAL HIGH (ref 65–99)
OSMOLALITY: 295 (ref 275–301)
Potassium: 4 mmol/L (ref 3.5–5.1)
SGPT (ALT): 18 U/L
Sodium: 137 mmol/L (ref 136–145)
TOTAL PROTEIN: 6.6 g/dL (ref 6.4–8.2)

## 2013-11-27 LAB — CBC WITH DIFFERENTIAL/PLATELET
Basophil #: 0.1 10*3/uL (ref 0.0–0.1)
Basophil %: 0.7 %
Eosinophil #: 0.2 10*3/uL (ref 0.0–0.7)
Eosinophil %: 2.4 %
HCT: 35.3 % — AB (ref 40.0–52.0)
HGB: 11.7 g/dL — ABNORMAL LOW (ref 13.0–18.0)
LYMPHS ABS: 1.1 10*3/uL (ref 1.0–3.6)
LYMPHS PCT: 13.5 %
MCH: 29.1 pg (ref 26.0–34.0)
MCHC: 33.1 g/dL (ref 32.0–36.0)
MCV: 88 fL (ref 80–100)
MONO ABS: 1 x10 3/mm (ref 0.2–1.0)
Monocyte %: 12.4 %
Neutrophil #: 5.8 10*3/uL (ref 1.4–6.5)
Neutrophil %: 71 %
Platelet: 167 10*3/uL (ref 150–440)
RBC: 4.02 10*6/uL — AB (ref 4.40–5.90)
RDW: 13.6 % (ref 11.5–14.5)
WBC: 8.2 10*3/uL (ref 3.8–10.6)

## 2013-11-27 LAB — PRO B NATRIURETIC PEPTIDE: B-Type Natriuretic Peptide: 1340 pg/mL — ABNORMAL HIGH (ref 0–125)

## 2013-11-27 LAB — TROPONIN I: Troponin-I: <0.02 ng/mL

## 2013-11-28 LAB — BASIC METABOLIC PANEL
Anion Gap: 8 (ref 7–16)
BUN: 54 mg/dL — ABNORMAL HIGH (ref 7–18)
CALCIUM: 8.3 mg/dL — AB (ref 8.5–10.1)
CO2: 31 mmol/L (ref 21–32)
CREATININE: 3.46 mg/dL — AB (ref 0.60–1.30)
Chloride: 103 mmol/L (ref 98–107)
EGFR (Non-African Amer.): 18 — ABNORMAL LOW
GFR CALC AF AMER: 21 — AB
GLUCOSE: 94 mg/dL (ref 65–99)
OSMOLALITY: 298 (ref 275–301)
Potassium: 3.6 mmol/L (ref 3.5–5.1)
SODIUM: 142 mmol/L (ref 136–145)

## 2013-11-28 LAB — CBC WITH DIFFERENTIAL/PLATELET
Basophil #: 0 10*3/uL (ref 0.0–0.1)
Basophil %: 0.6 %
EOS ABS: 0.2 10*3/uL (ref 0.0–0.7)
Eosinophil %: 2.4 %
HCT: 35.4 % — AB (ref 40.0–52.0)
HGB: 11.6 g/dL — AB (ref 13.0–18.0)
LYMPHS ABS: 1.2 10*3/uL (ref 1.0–3.6)
Lymphocyte %: 17.7 %
MCH: 28.7 pg (ref 26.0–34.0)
MCHC: 32.7 g/dL (ref 32.0–36.0)
MCV: 88 fL (ref 80–100)
MONO ABS: 1.2 x10 3/mm — AB (ref 0.2–1.0)
MONOS PCT: 17.1 %
NEUTROS PCT: 62.2 %
Neutrophil #: 4.2 10*3/uL (ref 1.4–6.5)
Platelet: 161 10*3/uL (ref 150–440)
RBC: 4.04 10*6/uL — ABNORMAL LOW (ref 4.40–5.90)
RDW: 13.5 % (ref 11.5–14.5)
WBC: 6.8 10*3/uL (ref 3.8–10.6)

## 2013-11-28 LAB — MAGNESIUM: MAGNESIUM: 1.6 mg/dL — AB

## 2013-11-28 LAB — TROPONIN I: Troponin-I: <0.02 ng/mL

## 2013-11-29 LAB — CBC WITH DIFFERENTIAL/PLATELET
Basophil #: 0.1 10*3/uL (ref 0.0–0.1)
Basophil %: 1 %
Eosinophil #: 0.2 10*3/uL (ref 0.0–0.7)
Eosinophil %: 2.3 %
HCT: 34.8 % — AB (ref 40.0–52.0)
HGB: 11.4 g/dL — ABNORMAL LOW (ref 13.0–18.0)
LYMPHS ABS: 1.8 10*3/uL (ref 1.0–3.6)
Lymphocyte %: 25.3 %
MCH: 28.7 pg (ref 26.0–34.0)
MCHC: 32.7 g/dL (ref 32.0–36.0)
MCV: 88 fL (ref 80–100)
MONOS PCT: 15.1 %
Monocyte #: 1 x10 3/mm (ref 0.2–1.0)
NEUTROS ABS: 3.9 10*3/uL (ref 1.4–6.5)
NEUTROS PCT: 56.3 %
Platelet: 170 10*3/uL (ref 150–440)
RBC: 3.96 10*6/uL — AB (ref 4.40–5.90)
RDW: 13.8 % (ref 11.5–14.5)
WBC: 6.9 10*3/uL (ref 3.8–10.6)

## 2013-11-29 LAB — BASIC METABOLIC PANEL
ANION GAP: 9 (ref 7–16)
BUN: 42 mg/dL — ABNORMAL HIGH (ref 7–18)
CALCIUM: 8.2 mg/dL — AB (ref 8.5–10.1)
CO2: 27 mmol/L (ref 21–32)
Chloride: 110 mmol/L — ABNORMAL HIGH (ref 98–107)
Creatinine: 2.42 mg/dL — ABNORMAL HIGH (ref 0.60–1.30)
EGFR (Non-African Amer.): 28 — ABNORMAL LOW
GFR CALC AF AMER: 33 — AB
Glucose: 74 mg/dL (ref 65–99)
Osmolality: 300 (ref 275–301)
Potassium: 3.4 mmol/L — ABNORMAL LOW (ref 3.5–5.1)
SODIUM: 146 mmol/L — AB (ref 136–145)

## 2013-11-30 LAB — BASIC METABOLIC PANEL
Anion Gap: 4 — ABNORMAL LOW (ref 7–16)
BUN: 33 mg/dL — AB (ref 7–18)
CHLORIDE: 106 mmol/L (ref 98–107)
CO2: 30 mmol/L (ref 21–32)
CREATININE: 2.2 mg/dL — AB (ref 0.60–1.30)
Calcium, Total: 8.2 mg/dL — ABNORMAL LOW (ref 8.5–10.1)
EGFR (African American): 37 — ABNORMAL LOW
EGFR (Non-African Amer.): 32 — ABNORMAL LOW
GLUCOSE: 84 mg/dL (ref 65–99)
Osmolality: 286 (ref 275–301)
POTASSIUM: 3.4 mmol/L — AB (ref 3.5–5.1)
SODIUM: 140 mmol/L (ref 136–145)

## 2013-12-01 LAB — BASIC METABOLIC PANEL
Anion Gap: 7 (ref 7–16)
BUN: 33 mg/dL — AB (ref 7–18)
CALCIUM: 8.3 mg/dL — AB (ref 8.5–10.1)
CHLORIDE: 104 mmol/L (ref 98–107)
CO2: 29 mmol/L (ref 21–32)
CREATININE: 2.34 mg/dL — AB (ref 0.60–1.30)
EGFR (African American): 34 — ABNORMAL LOW
GFR CALC NON AF AMER: 29 — AB
Glucose: 69 mg/dL (ref 65–99)
Osmolality: 285 (ref 275–301)
POTASSIUM: 4 mmol/L (ref 3.5–5.1)
SODIUM: 140 mmol/L (ref 136–145)

## 2013-12-02 DIAGNOSIS — I509 Heart failure, unspecified: Secondary | ICD-10-CM

## 2013-12-02 LAB — BASIC METABOLIC PANEL
ANION GAP: 6 — AB (ref 7–16)
BUN: 33 mg/dL — AB (ref 7–18)
CREATININE: 2.41 mg/dL — AB (ref 0.60–1.30)
Calcium, Total: 9.2 mg/dL (ref 8.5–10.1)
Chloride: 101 mmol/L (ref 98–107)
Co2: 31 mmol/L (ref 21–32)
GFR CALC AF AMER: 33 — AB
GFR CALC NON AF AMER: 28 — AB
Glucose: 138 mg/dL — ABNORMAL HIGH (ref 65–99)
Osmolality: 285 (ref 275–301)
Potassium: 3.8 mmol/L (ref 3.5–5.1)
Sodium: 138 mmol/L (ref 136–145)

## 2013-12-02 LAB — CULTURE, BLOOD (SINGLE)

## 2013-12-04 ENCOUNTER — Encounter: Payer: Self-pay | Admitting: Internal Medicine

## 2013-12-04 DIAGNOSIS — R079 Chest pain, unspecified: Secondary | ICD-10-CM

## 2013-12-04 DIAGNOSIS — I5023 Acute on chronic systolic (congestive) heart failure: Secondary | ICD-10-CM

## 2013-12-04 LAB — URINE CULTURE

## 2013-12-05 ENCOUNTER — Encounter: Payer: Self-pay | Admitting: Internal Medicine

## 2013-12-05 LAB — BASIC METABOLIC PANEL
Anion Gap: 8 (ref 7–16)
BUN: 54 mg/dL — ABNORMAL HIGH (ref 7–18)
CALCIUM: 9.1 mg/dL (ref 8.5–10.1)
CHLORIDE: 98 mmol/L (ref 98–107)
CO2: 31 mmol/L (ref 21–32)
Creatinine: 2.68 mg/dL — ABNORMAL HIGH (ref 0.60–1.30)
EGFR (African American): 29 — ABNORMAL LOW
EGFR (Non-African Amer.): 25 — ABNORMAL LOW
GLUCOSE: 114 mg/dL — AB (ref 65–99)
Osmolality: 289 (ref 275–301)
Potassium: 3.8 mmol/L (ref 3.5–5.1)
Sodium: 137 mmol/L (ref 136–145)

## 2013-12-06 ENCOUNTER — Telehealth: Payer: Self-pay

## 2013-12-06 LAB — BASIC METABOLIC PANEL
Anion Gap: 5 — ABNORMAL LOW (ref 7–16)
BUN: 53 mg/dL — AB (ref 7–18)
CREATININE: 2.68 mg/dL — AB (ref 0.60–1.30)
Calcium, Total: 8.7 mg/dL (ref 8.5–10.1)
Chloride: 97 mmol/L — ABNORMAL LOW (ref 98–107)
Co2: 30 mmol/L (ref 21–32)
GFR CALC AF AMER: 29 — AB
GFR CALC NON AF AMER: 25 — AB
Glucose: 161 mg/dL — ABNORMAL HIGH (ref 65–99)
Osmolality: 282 (ref 275–301)
POTASSIUM: 3.5 mmol/L (ref 3.5–5.1)
SODIUM: 132 mmol/L — AB (ref 136–145)

## 2013-12-06 LAB — BETA STREP CULTURE(ARMC)

## 2013-12-06 NOTE — Telephone Encounter (Signed)
Attempted to contact pt regarding discharge from [facility] 12/06/13. Left message for pt to call back.

## 2013-12-07 ENCOUNTER — Ambulatory Visit: Payer: Self-pay | Admitting: Family

## 2013-12-07 LAB — BASIC METABOLIC PANEL
Anion Gap: 8 (ref 7–16)
BUN: 50 mg/dL — AB (ref 7–18)
CO2: 29 mmol/L (ref 21–32)
Calcium, Total: 9.1 mg/dL (ref 8.5–10.1)
Chloride: 100 mmol/L (ref 98–107)
Creatinine: 2.61 mg/dL — ABNORMAL HIGH (ref 0.60–1.30)
EGFR (African American): 30 — ABNORMAL LOW
EGFR (Non-African Amer.): 26 — ABNORMAL LOW
GLUCOSE: 113 mg/dL — AB (ref 65–99)
Osmolality: 288 (ref 275–301)
Potassium: 4.3 mmol/L (ref 3.5–5.1)
Sodium: 137 mmol/L (ref 136–145)

## 2013-12-07 LAB — CULTURE, BLOOD (SINGLE)

## 2013-12-08 NOTE — Telephone Encounter (Signed)
Patient contacted regarding discharge from [facility] on 12/06/13.  Patient understands to follow up with Dr. Rockey Situ on 12/22/13 at 8:30 at [facility]. Patient understands discharge instructions? yes Patient understands medications and regiment? yes Patient understands to bring all medications to this visit? yes  Spoke w/ pt's wife, who states that pt was told in hospital that he does not need O2, but his fingers and lips turn blue while performing his ADLs.  She reports that pt does not have PCP, goes thru the [place], and would like a referral to a new PCP in the [facility] area. Advised her that pt has [employer] ins and we, as a specialist, cannot make this referral.  Spoke w/ pt.  He needed clarification on his meds, as he was advised, per [facility] discharge instructions, to increase carvedilol up to 3.125 mg BID, but he has been taking 6.25 BID. Offered pt sooner appt to see Dr. Fletcher Anon, but he states that he would prefer to keep appt w/ Dr. Rockey Situ.  Asked him to call back if he needs further assistance before his appt.

## 2013-12-11 ENCOUNTER — Institutional Professional Consult (permissible substitution): Payer: Non-veteran care | Admitting: Internal Medicine

## 2013-12-21 ENCOUNTER — Encounter: Payer: Non-veteran care | Admitting: Cardiovascular Disease

## 2013-12-22 ENCOUNTER — Encounter: Payer: Self-pay | Admitting: *Deleted

## 2013-12-22 ENCOUNTER — Encounter: Payer: Non-veteran care | Admitting: Cardiovascular Disease

## 2014-01-03 ENCOUNTER — Encounter (INDEPENDENT_AMBULATORY_CARE_PROVIDER_SITE_OTHER): Payer: Self-pay

## 2014-01-03 ENCOUNTER — Ambulatory Visit (INDEPENDENT_AMBULATORY_CARE_PROVIDER_SITE_OTHER): Payer: Medicare HMO | Admitting: Internal Medicine

## 2014-01-03 ENCOUNTER — Encounter: Payer: Self-pay | Admitting: Internal Medicine

## 2014-01-03 VITALS — BP 130/82 | HR 62 | Temp 98.2°F | Ht 72.0 in | Wt 222.8 lb

## 2014-01-03 DIAGNOSIS — F17200 Nicotine dependence, unspecified, uncomplicated: Secondary | ICD-10-CM

## 2014-01-03 DIAGNOSIS — J9601 Acute respiratory failure with hypoxia: Secondary | ICD-10-CM

## 2014-01-03 DIAGNOSIS — F17210 Nicotine dependence, cigarettes, uncomplicated: Secondary | ICD-10-CM

## 2014-01-03 DIAGNOSIS — J9600 Acute respiratory failure, unspecified whether with hypoxia or hypercapnia: Secondary | ICD-10-CM

## 2014-01-03 MED ORDER — NITROGLYCERIN 0.4 MG SL SUBL
0.4000 mg | SUBLINGUAL_TABLET | SUBLINGUAL | Status: AC | PRN
Start: 2014-01-03 — End: ?

## 2014-01-03 NOTE — Patient Instructions (Addendum)
See Dr Candis Musa as soon as possible with all active medications   You do not appear to have any active asthma or copd and I strongly suspect your medications are your problem - in particular the use of your lisinopril if you have noisy breathing but do not make any changes until you see Dr Candis Musa.

## 2014-01-03 NOTE — Progress Notes (Signed)
   Subjective:    Patient ID: [name], male    DOB: February 23, 1956  MRN: [id number]  HPI  56 yowm active smoker with sob x around 2005 with freq flares attributed to chf referred 01/03/2014 to pulmonary clinic for sob  01/03/2014 [facility] office visit/ Kemonie Cutillo  Chief Complaint  Patient presents with  . Pulmonary Consult    Referred by Dr. Darylene Price.  Pt c/o SOB for the past 6 wks. He states had recent hospitalization at [facility] for PNA and CHF. He states that he is SOB with or without exertion. He has AM cough- prod with moderate clear to brown sputum. He is on BIPAP with sleep.   5 years mailbox slt downhill about 129ft  has to stop coming back due to sob  Cough is mostly in ams clears out over about 10 min usually white mucus x [facility]v [facility] without [facility]  occ at rest uses alb hfa and neb per wife who  is more aware of pulmonary symptoms  than patient - she notices "noisy breathing" not much better with saba   .No obvious other patterns in day to day or daytime variabilty or assoc  or cp or chest tightness, subjective wheeze overt sinus or hb symptoms. No unusual exp hx or h/o childhood pna/ asthma or knowledge of premature birth.  Sleeping ok without nocturnal  or early am exacerbation  of respiratory  c/o's or need for noct saba. Also denies any obvious fluctuation of symptoms with weather or environmental changes or other aggravating or alleviating factors except as outlined above   Current Medications, Allergies, Complete Past Medical History, Past Surgical History, Family History, and Social History were reviewed in [employer] record.           Review of Systems  Constitutional: Negative for fever, chills, activity change, appetite change and unexpected weight change.  HENT: Negative for congestion, dental problem, postnasal drip, rhinorrhea, sneezing, sore throat, trouble swallowing and voice change.   Eyes: Negative for visual  disturbance.  Respiratory: Positive for shortness of breath. Negative for cough and choking.   Cardiovascular: Negative for chest pain and leg swelling.  Gastrointestinal: Negative for nausea, vomiting and abdominal pain.  Genitourinary: Negative for difficulty urinating.  Musculoskeletal: Negative for arthralgias.  Skin: Negative for rash.  Psychiatric/Behavioral: Negative for behavioral problems and confusion.       Objective:   Physical Exam  Wt Readings from Last 3 Encounters:  01/03/14 222 lb 12.8 oz (101.061 kg)  06/28/13 194 lb 7.1 oz (88.2 kg)  06/04/13 211 lb 3.2 oz (95.8 kg)     amb hoarse wm with prominent pseudowheeze  HEENT: nl dentition, turbinates, and orophanx. Nl external ear canals without cough reflex   NECK :  without JVD/Nodes/TM/ nl carotid upstrokes bilaterally   LUNGS: no acc muscle use, clear to A and P bilaterally without cough on insp or exp maneuvers   CV:  RRR  no s3 or murmur or increase in P2, no edema   ABD:  soft and nontender with nl excursion in the supine position. No bruits or organomegaly, bowel sounds nl  MS:  warm without deformities, calf tenderness, cyanosis or clubbing  SKIN: warm and dry without lesions    NEURO:  alert, approp, no deficits     cxr 06/29/13  No active dz  cxr report 11/29/13 c/w chf       Assessment & Plan:

## 2014-01-04 ENCOUNTER — Ambulatory Visit: Payer: Self-pay | Admitting: Family

## 2014-01-06 DIAGNOSIS — F17210 Nicotine dependence, cigarettes, uncomplicated: Secondary | ICD-10-CM | POA: Insufficient documentation

## 2014-01-06 NOTE — Assessment & Plan Note (Addendum)
>   3 min discussion I reviewed the Flethcher curve with patient that basically indicates  if you quit smoking when your best day FEV1 is still well preserved (as is the clearly  the case here by spirometry today)  it is highly unlikely you will progress to severe disease and informed the patient there was no medication on the market that has proven to change the curve or the likelihood of progression.  Therefore stopping smoking and maintaining abstinence is the most important aspect of care, not choice of inhalers or for that matter, doctors.

## 2014-01-06 NOTE — Assessment & Plan Note (Addendum)
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  Spirometry also supports pseudowheeze  So Symptoms are markedly disproportionate to objective findings and not clear this is a lung problem but pt does appear to have difficult airway management issues. DDX of  difficult airways management all start with A and  include Adherence, Ace Inhibitors, Acid Reflux, Active Sinus Disease, Alpha 1 Antitripsin deficiency, Anxiety masquerading as Airways dz,  ABPA,  allergy(esp in young), Aspiration (esp in elderly), Adverse effects of DPI,  Active smokers, plus two Bs  = Bronchiectasis and Beta blocker use..and one C= CHF  Adherence is always the initial "prime suspect" and is a multilayered concern that requires a "trust but verify" approach in every patient - starting with knowing how to use medications, especially inhalers, correctly, keeping up with refills and understanding the fundamental difference between maintenance and prns vs those medications only taken for a very short course and then stopped and not refilled.  - his meds are a mess and he and wife are thoroughly confused about what he is actually taking  - rec no changes today as he's actually doing ok and try to keep things as simple as possible  ? ACEi effects > rec 6 week trial off then return here to regroup with full  pfts or refer to the [facility] pulmonary clinic at that [facility]

## 2014-01-23 ENCOUNTER — Observation Stay (HOSPITAL_COMMUNITY)
Admission: EM | Admit: 2014-01-23 | Discharge: 2014-01-24 | Disposition: A | Discharge status: Home / Self Care | Payer: Medicare HMO | Attending: Emergency Medicine | Admitting: Emergency Medicine

## 2014-01-23 ENCOUNTER — Emergency Department (HOSPITAL_COMMUNITY): Payer: Medicare HMO

## 2014-01-23 ENCOUNTER — Encounter (HOSPITAL_COMMUNITY): Payer: Self-pay | Admitting: Emergency Medicine

## 2014-01-23 ENCOUNTER — Observation Stay (HOSPITAL_COMMUNITY): Payer: Medicare HMO

## 2014-01-23 DIAGNOSIS — R4182 Altered mental status, unspecified: Principal | ICD-10-CM | POA: Insufficient documentation

## 2014-01-23 DIAGNOSIS — K2100 Gastro-esophageal reflux disease with esophagitis, without bleeding: Secondary | ICD-10-CM | POA: Diagnosis not present

## 2014-01-23 DIAGNOSIS — I5022 Chronic systolic (congestive) heart failure: Secondary | ICD-10-CM | POA: Diagnosis present

## 2014-01-23 DIAGNOSIS — K3184 Gastroparesis: Secondary | ICD-10-CM | POA: Diagnosis not present

## 2014-01-23 DIAGNOSIS — G40909 Epilepsy, unspecified, not intractable, without status epilepticus: Secondary | ICD-10-CM | POA: Diagnosis not present

## 2014-01-23 DIAGNOSIS — I129 Hypertensive chronic kidney disease with stage 1 through stage 4 chronic kidney disease, or unspecified chronic kidney disease: Secondary | ICD-10-CM | POA: Diagnosis not present

## 2014-01-23 DIAGNOSIS — E1149 Type 2 diabetes mellitus with other diabetic neurological complication: Secondary | ICD-10-CM | POA: Diagnosis not present

## 2014-01-23 DIAGNOSIS — E669 Obesity, unspecified: Secondary | ICD-10-CM | POA: Insufficient documentation

## 2014-01-23 DIAGNOSIS — J9601 Acute respiratory failure with hypoxia: Secondary | ICD-10-CM

## 2014-01-23 DIAGNOSIS — Z9989 Dependence on other enabling machines and devices: Secondary | ICD-10-CM

## 2014-01-23 DIAGNOSIS — G8929 Other chronic pain: Secondary | ICD-10-CM | POA: Diagnosis not present

## 2014-01-23 DIAGNOSIS — I428 Other cardiomyopathies: Secondary | ICD-10-CM | POA: Diagnosis not present

## 2014-01-23 DIAGNOSIS — N183 Chronic kidney disease, stage 3 unspecified: Secondary | ICD-10-CM | POA: Diagnosis present

## 2014-01-23 DIAGNOSIS — G894 Chronic pain syndrome: Secondary | ICD-10-CM | POA: Diagnosis present

## 2014-01-23 DIAGNOSIS — Z794 Long term (current) use of insulin: Secondary | ICD-10-CM | POA: Diagnosis not present

## 2014-01-23 DIAGNOSIS — F17200 Nicotine dependence, unspecified, uncomplicated: Secondary | ICD-10-CM | POA: Diagnosis not present

## 2014-01-23 DIAGNOSIS — Z79899 Other long term (current) drug therapy: Secondary | ICD-10-CM | POA: Insufficient documentation

## 2014-01-23 DIAGNOSIS — J449 Chronic obstructive pulmonary disease, unspecified: Secondary | ICD-10-CM | POA: Insufficient documentation

## 2014-01-23 DIAGNOSIS — E785 Hyperlipidemia, unspecified: Secondary | ICD-10-CM | POA: Diagnosis present

## 2014-01-23 DIAGNOSIS — I10 Essential (primary) hypertension: Secondary | ICD-10-CM | POA: Diagnosis present

## 2014-01-23 DIAGNOSIS — N189 Chronic kidney disease, unspecified: Secondary | ICD-10-CM

## 2014-01-23 DIAGNOSIS — R892 Abnormal level of other drugs, medicaments and biological substances in specimens from other organs, systems and tissues: Secondary | ICD-10-CM

## 2014-01-23 DIAGNOSIS — R0902 Hypoxemia: Secondary | ICD-10-CM | POA: Diagnosis present

## 2014-01-23 DIAGNOSIS — Z720 Tobacco use: Secondary | ICD-10-CM | POA: Diagnosis present

## 2014-01-23 DIAGNOSIS — E1142 Type 2 diabetes mellitus with diabetic polyneuropathy: Secondary | ICD-10-CM | POA: Insufficient documentation

## 2014-01-23 DIAGNOSIS — M129 Arthropathy, unspecified: Secondary | ICD-10-CM | POA: Insufficient documentation

## 2014-01-23 DIAGNOSIS — I509 Heart failure, unspecified: Secondary | ICD-10-CM | POA: Diagnosis not present

## 2014-01-23 DIAGNOSIS — K449 Diaphragmatic hernia without obstruction or gangrene: Secondary | ICD-10-CM | POA: Diagnosis not present

## 2014-01-23 DIAGNOSIS — J9600 Acute respiratory failure, unspecified whether with hypoxia or hypercapnia: Secondary | ICD-10-CM

## 2014-01-23 DIAGNOSIS — G9340 Encephalopathy, unspecified: Secondary | ICD-10-CM | POA: Diagnosis present

## 2014-01-23 DIAGNOSIS — N179 Acute kidney failure, unspecified: Secondary | ICD-10-CM | POA: Diagnosis not present

## 2014-01-23 DIAGNOSIS — D649 Anemia, unspecified: Secondary | ICD-10-CM | POA: Diagnosis present

## 2014-01-23 DIAGNOSIS — D72829 Elevated white blood cell count, unspecified: Secondary | ICD-10-CM | POA: Diagnosis present

## 2014-01-23 DIAGNOSIS — F3289 Other specified depressive episodes: Secondary | ICD-10-CM | POA: Diagnosis not present

## 2014-01-23 DIAGNOSIS — J189 Pneumonia, unspecified organism: Secondary | ICD-10-CM

## 2014-01-23 DIAGNOSIS — F329 Major depressive disorder, single episode, unspecified: Secondary | ICD-10-CM | POA: Diagnosis not present

## 2014-01-23 DIAGNOSIS — G4733 Obstructive sleep apnea (adult) (pediatric): Secondary | ICD-10-CM | POA: Diagnosis present

## 2014-01-23 DIAGNOSIS — I2510 Atherosclerotic heart disease of native coronary artery without angina pectoris: Secondary | ICD-10-CM | POA: Diagnosis not present

## 2014-01-23 DIAGNOSIS — J4489 Other specified chronic obstructive pulmonary disease: Secondary | ICD-10-CM | POA: Insufficient documentation

## 2014-01-23 DIAGNOSIS — K219 Gastro-esophageal reflux disease without esophagitis: Secondary | ICD-10-CM | POA: Diagnosis present

## 2014-01-23 DIAGNOSIS — R739 Hyperglycemia, unspecified: Secondary | ICD-10-CM

## 2014-01-23 HISTORY — DX: Chronic obstructive pulmonary disease, unspecified: J44.9

## 2014-01-23 LAB — BLOOD GAS, ARTERIAL
Acid-Base Excess: 1.5 mmol/L (ref 0.0–2.0)
Bicarbonate: 26.2 mEq/L — ABNORMAL HIGH (ref 20.0–24.0)
Drawn by: 234301
FIO2: 21 %
O2 SAT: 94.8 %
Patient temperature: 37
TCO2: 23.5 mmol/L (ref 0–100)
pCO2 arterial: 45.7 mmHg — ABNORMAL HIGH (ref 35.0–45.0)
pH, Arterial: 7.376 (ref 7.350–7.450)
pO2, Arterial: 74.7 mmHg — ABNORMAL LOW (ref 80.0–100.0)

## 2014-01-23 LAB — PROTIME-INR
INR: 0.96 (ref 0.00–1.49)
Prothrombin Time: 12.8 seconds (ref 11.6–15.2)

## 2014-01-23 LAB — HEPATIC FUNCTION PANEL
ALT: 11 U/L (ref 0–53)
AST: 15 U/L (ref 0–37)
Albumin: 3.2 g/dL — ABNORMAL LOW (ref 3.5–5.2)
Alkaline Phosphatase: 93 U/L (ref 39–117)
Bilirubin, Direct: <0.2 mg/dL (ref 0.0–0.3)
Total Bilirubin: 0.4 mg/dL (ref 0.3–1.2)
Total Protein: 6.8 g/dL (ref 6.0–8.3)

## 2014-01-23 LAB — DIFFERENTIAL
Basophils Absolute: 0 10*3/uL (ref 0.0–0.1)
Basophils Relative: 0 % (ref 0–1)
Eosinophils Absolute: 0.2 10*3/uL (ref 0.0–0.7)
Eosinophils Relative: 1 % (ref 0–5)
LYMPHS ABS: 1.1 10*3/uL (ref 0.7–4.0)
Lymphocytes Relative: 6 % — ABNORMAL LOW (ref 12–46)
MONOS PCT: 6 % (ref 3–12)
Monocytes Absolute: 1 10*3/uL (ref 0.1–1.0)
Neutro Abs: 15.3 10*3/uL — ABNORMAL HIGH (ref 1.7–7.7)
Neutrophils Relative %: 87 % — ABNORMAL HIGH (ref 43–77)

## 2014-01-23 LAB — COMPREHENSIVE METABOLIC PANEL
ALBUMIN: 3.2 g/dL — AB (ref 3.5–5.2)
ALK PHOS: 93 U/L (ref 39–117)
ALT: 11 U/L (ref 0–53)
AST: 13 U/L (ref 0–37)
Anion gap: 11 (ref 5–15)
BUN: 36 mg/dL — AB (ref 6–23)
CO2: 26 mEq/L (ref 19–32)
Calcium: 9.2 mg/dL (ref 8.4–10.5)
Chloride: 102 mEq/L (ref 96–112)
Creatinine, Ser: 2.46 mg/dL — ABNORMAL HIGH (ref 0.50–1.35)
GFR calc Af Amer: 31 mL/min — ABNORMAL LOW (ref >90)
GFR calc non Af Amer: 27 mL/min — ABNORMAL LOW (ref >90)
Glucose, Bld: 294 mg/dL — ABNORMAL HIGH (ref 70–99)
POTASSIUM: 4.2 meq/L (ref 3.7–5.3)
SODIUM: 139 meq/L (ref 137–147)
Total Bilirubin: 0.4 mg/dL (ref 0.3–1.2)
Total Protein: 6.8 g/dL (ref 6.0–8.3)

## 2014-01-23 LAB — CBC
HCT: 34.8 % — ABNORMAL LOW (ref 39.0–52.0)
HEMOGLOBIN: 11.3 g/dL — AB (ref 13.0–17.0)
MCH: 27.9 pg (ref 26.0–34.0)
MCHC: 32.5 g/dL (ref 30.0–36.0)
MCV: 85.9 fL (ref 78.0–100.0)
Platelets: 197 10*3/uL (ref 150–400)
RBC: 4.05 MIL/uL — AB (ref 4.22–5.81)
RDW: 14.1 % (ref 11.5–15.5)
WBC: 17.5 10*3/uL — AB (ref 4.0–10.5)

## 2014-01-23 LAB — RAPID URINE DRUG SCREEN, HOSP PERFORMED
AMPHETAMINES: NOT DETECTED
BARBITURATES: NOT DETECTED
Benzodiazepines: NOT DETECTED
Cocaine: NOT DETECTED
Opiates: POSITIVE — AB
Tetrahydrocannabinol: NOT DETECTED

## 2014-01-23 LAB — URINALYSIS, ROUTINE W REFLEX MICROSCOPIC
Bilirubin Urine: NEGATIVE
GLUCOSE, UA: 250 mg/dL — AB
KETONES UR: NEGATIVE mg/dL
LEUKOCYTES UA: NEGATIVE
NITRITE: NEGATIVE
PROTEIN: 100 mg/dL — AB
Specific Gravity, Urine: 1.01 (ref 1.005–1.030)
UROBILINOGEN UA: 0.2 mg/dL (ref 0.0–1.0)
pH: 6 (ref 5.0–8.0)

## 2014-01-23 LAB — LACTIC ACID, PLASMA: LACTIC ACID, VENOUS: 1 mmol/L (ref 0.5–2.2)

## 2014-01-23 LAB — GLUCOSE, CAPILLARY: Glucose-Capillary: 297 mg/dL — ABNORMAL HIGH (ref 70–99)

## 2014-01-23 LAB — URINE MICROSCOPIC-ADD ON

## 2014-01-23 LAB — ETHANOL: Alcohol, Ethyl (B): <11 mg/dL (ref 0–11)

## 2014-01-23 LAB — APTT: aPTT: 26 seconds (ref 24–37)

## 2014-01-23 LAB — TROPONIN I: Troponin I: <0.3 ng/mL (ref <0.30)

## 2014-01-23 MED ORDER — ROPINIROLE HCL 1 MG PO TABS
3.0000 mg | ORAL_TABLET | Freq: Every day | ORAL | Status: DC
  Administered 2014-01-23: 3 mg via ORAL
  Filled 2014-01-23 (×3): qty 3

## 2014-01-23 MED ORDER — PANTOPRAZOLE SODIUM 40 MG PO TBEC
40.0000 mg | DELAYED_RELEASE_TABLET | Freq: Every day | ORAL | Status: DC
  Administered 2014-01-23 – 2014-01-24 (×2): 40 mg via ORAL
  Filled 2014-01-23 (×2): qty 1

## 2014-01-23 MED ORDER — ROPINIROLE HCL 1 MG PO TABS
ORAL_TABLET | ORAL | Status: AC
Start: 2014-01-23 — End: 2014-01-23
  Filled 2014-01-23: qty 3

## 2014-01-23 MED ORDER — CLONAZEPAM 0.5 MG PO TABS
0.5000 mg | ORAL_TABLET | Freq: Two times a day (BID) | ORAL | Status: DC | PRN
  Administered 2014-01-24: 0.5 mg via ORAL
  Filled 2014-01-23: qty 1

## 2014-01-23 MED ORDER — ALUM & MAG HYDROXIDE-SIMETH 200-200-20 MG/5ML PO SUSP: 30.0000 mL | Freq: Four times a day (QID) | ORAL | Status: DC | PRN

## 2014-01-23 MED ORDER — SODIUM CHLORIDE 0.9 % IJ SOLN
3.0000 mL | Freq: Two times a day (BID) | INTRAMUSCULAR | Status: DC
  Administered 2014-01-23: 3 mL via INTRAVENOUS

## 2014-01-23 MED ORDER — GABAPENTIN 300 MG PO CAPS
300.0000 mg | ORAL_CAPSULE | Freq: Three times a day (TID) | ORAL | Status: DC
  Administered 2014-01-23 – 2014-01-24 (×2): 300 mg via ORAL
  Filled 2014-01-23 (×2): qty 1

## 2014-01-23 MED ORDER — INSULIN ASPART 100 UNIT/ML ~~LOC~~ SOLN
0.0000 [IU] | Freq: Every day | SUBCUTANEOUS | Status: DC
  Administered 2014-01-23: 3 [IU] via SUBCUTANEOUS

## 2014-01-23 MED ORDER — ACETAMINOPHEN 325 MG PO TABS: 650.0000 mg | ORAL_TABLET | Freq: Four times a day (QID) | ORAL | Status: DC | PRN

## 2014-01-23 MED ORDER — ISOSORBIDE MONONITRATE ER 30 MG PO TB24
15.0000 mg | ORAL_TABLET | Freq: Every day | ORAL | Status: DC
  Administered 2014-01-23 – 2014-01-24 (×2): 15 mg via ORAL
  Filled 2014-01-23 (×4): qty 1

## 2014-01-23 MED ORDER — POLYETHYLENE GLYCOL 3350 17 G PO PACK: 17.0000 g | PACK | Freq: Every day | ORAL | Status: DC | PRN

## 2014-01-23 MED ORDER — ENOXAPARIN SODIUM 40 MG/0.4ML ~~LOC~~ SOLN
40.0000 mg | SUBCUTANEOUS | Status: DC
  Administered 2014-01-23: 40 mg via SUBCUTANEOUS

## 2014-01-23 MED ORDER — ACETAMINOPHEN 650 MG RE SUPP: 650.0000 mg | Freq: Four times a day (QID) | RECTAL | Status: DC | PRN

## 2014-01-23 MED ORDER — ONDANSETRON HCL 4 MG/2ML IJ SOLN: 4.0000 mg | Freq: Four times a day (QID) | INTRAMUSCULAR | Status: DC | PRN

## 2014-01-23 MED ORDER — LEVOFLOXACIN IN D5W 750 MG/150ML IV SOLN
750.0000 mg | INTRAVENOUS | Status: DC
  Administered 2014-01-23: 750 mg via INTRAVENOUS
  Filled 2014-01-23: qty 150

## 2014-01-23 MED ORDER — METOLAZONE 2.5 MG PO TABS: 2.5000 mg | ORAL_TABLET | Freq: Every day | ORAL | Status: DC | PRN

## 2014-01-23 MED ORDER — NALOXONE HCL 0.4 MG/ML IJ SOLN
0.4000 mg | Freq: Once | INTRAMUSCULAR | Status: AC
  Administered 2014-01-23: 0.4 mg via INTRAVENOUS
  Filled 2014-01-23: qty 1

## 2014-01-23 MED ORDER — HYDRALAZINE HCL 25 MG PO TABS
25.0000 mg | ORAL_TABLET | Freq: Three times a day (TID) | ORAL | Status: DC
  Administered 2014-01-23 – 2014-01-24 (×4): 25 mg via ORAL
  Filled 2014-01-23 (×4): qty 1

## 2014-01-23 MED ORDER — CARVEDILOL 3.125 MG PO TABS
6.2500 mg | ORAL_TABLET | Freq: Two times a day (BID) | ORAL | Status: DC
  Administered 2014-01-23 – 2014-01-24 (×2): 6.25 mg via ORAL
  Filled 2014-01-23: qty 1
  Filled 2014-01-23 (×2): qty 2
  Filled 2014-01-23: qty 1

## 2014-01-23 MED ORDER — SODIUM CHLORIDE 0.9 % IV SOLN
INTRAVENOUS | Status: AC
  Administered 2014-01-23: 18:00:00 via INTRAVENOUS

## 2014-01-23 MED ORDER — INSULIN ASPART 100 UNIT/ML ~~LOC~~ SOLN
0.0000 [IU] | Freq: Three times a day (TID) | SUBCUTANEOUS | Status: DC
  Administered 2014-01-24 (×2): 7 [IU] via SUBCUTANEOUS

## 2014-01-23 MED ORDER — ENOXAPARIN SODIUM 40 MG/0.4ML ~~LOC~~ SOLN
40.0000 mg | SUBCUTANEOUS | Status: DC
  Administered 2014-01-23: 40 mg via SUBCUTANEOUS
  Filled 2014-01-23 (×2): qty 0.4

## 2014-01-23 MED ORDER — NITROGLYCERIN 0.4 MG SL SUBL: 0.4000 mg | SUBLINGUAL_TABLET | SUBLINGUAL | Status: DC | PRN

## 2014-01-23 MED ORDER — MORPHINE SULFATE 15 MG PO TABS
15.0000 mg | ORAL_TABLET | Freq: Two times a day (BID) | ORAL | Status: DC | PRN
  Administered 2014-01-24: 15 mg via ORAL
  Filled 2014-01-23: qty 1

## 2014-01-23 MED ORDER — ONDANSETRON HCL 4 MG PO TABS: 4.0000 mg | ORAL_TABLET | Freq: Four times a day (QID) | ORAL | Status: DC | PRN

## 2014-01-23 MED ORDER — SENNA 8.6 MG PO TABS
2.0000 | ORAL_TABLET | Freq: Two times a day (BID) | ORAL | Status: DC
  Administered 2014-01-23 – 2014-01-24 (×2): 17.2 mg via ORAL
  Filled 2014-01-23 (×2): qty 2

## 2014-01-23 MED ORDER — FENTANYL 100 MCG/HR TD PT72
100.0000 ug | MEDICATED_PATCH | TRANSDERMAL | Status: DC
  Administered 2014-01-23: 100 ug via TRANSDERMAL
  Filled 2014-01-23: qty 1

## 2014-01-23 NOTE — ED Notes (Signed)
Hospitalist notified of pt condition and will come to ED room to speak to pts wife. Pt is resting with eyes closed, responds to verbal.

## 2014-01-23 NOTE — ED Notes (Addendum)
Pt is seen by pain management at [facility] and was seen by them last week. Pt changed 143mcg Fentanyl patch yesterday and is unsure if he took his pain medications this AM prior to ED arrival. Pt is confused on date, drowsy but responds easily to voice, speaks with slurred speech. Pt did ambulate with out difficulty from wheelchair to stretcher.

## 2014-01-23 NOTE — ED Notes (Signed)
Pt is alert, sitting on side of bed, answers all questions appropriate.

## 2014-01-23 NOTE — H&P (Signed)
Patient seen and examined. Note reviewed  This patient has been admitted to the hospital with waxing and waning lethargy/somnolence. He apparently had a fall several weeks ago where he had suffered a head injury. Imaging was unremarkable. He has been evaluated or to other medical centers in the past 2 weeks. His wife brings him into the hospital today because he's increasingly somnolent and not behaving like himself. Review of his medication list so that he is on fentanyl patch, morphine for breakthrough, Klonopin for muscle spasms, gabapentin. WBC count is elevated at 17,000. Chest x-ray shows questionable pneumonia. Urinalysis is unremarkable. He was started on treatment for pneumonia with Levaquin. The patient received a dose of Narcan in the emergency room and immediately improved. It is clear that he is being overmedicated. He reports that he's been on his current dose of fentanyl and morphine past 8-9 months. He's been on his current dose of Klonopin for several years. He reports that his morphine as dosed as every 6 hours when necessary, but he does take it every 6 hours schedule. He feels that he may be taking too much morphine. At this time, he is awake, alert, conversing appropriately. He does not have any neurologic deficits. We'll restart the patient on his back in the past. We'll decrease the dosing of morphine from every 6 to every 12 hours. Continue Klonopin at reduced dose. He'll be admitted for observation overnight and if he remains stable, can possibly discharge tomorrow.  MEMON,JEHANZEB

## 2014-01-23 NOTE — ED Notes (Signed)
Pt returned from CT °

## 2014-01-23 NOTE — ED Notes (Signed)
Pt is back in bed and responds to verbal stimuli.

## 2014-01-23 NOTE — Progress Notes (Signed)
ANTIBIOTIC CONSULT NOTE  Pharmacy Consult for Levaquin Indication: pneumonia CAP  Allergies  Allergen Reactions  . Ms Contin [Morphine Sulfate Er]     Doesn't like to take in liquid form( burns and doesn't help) (pill form works fine)     Patient Measurements: Height: 6' (182.9 cm) IBW/kg (Calculated) : 77.6   Vital Signs: Temp: 98.7 F (37.1 C) (09/29 1749) Temp src: Oral (09/29 1749) BP: 141/78 mmHg (09/29 1749) Pulse Rate: 76 (09/29 1749) Intake/Output from previous day:   Intake/Output from this shift:    Labs:  Recent Labs  01/23/14 1147  WBC 17.5*  HGB 11.3*  PLT 197  CREATININE 2.46*   The CrCl is unknown because both a height and weight (above a minimum accepted value) are required for this calculation. No results found for this basename: VANCOTROUGH, VANCOPEAK, VANCORANDOM, GENTTROUGH, GENTPEAK, GENTRANDOM, TOBRATROUGH, TOBRAPEAK, TOBRARND, AMIKACINPEAK, AMIKACINTROU, AMIKACIN,  in the last 72 hours   Microbiology: No results found for this or any previous visit (from the past 720 hour(s)).  Anti-infectives   None     Assessment: Okay for Protocol, admitted for AMS and potential CAP.  Hx poor renal function, estmated CrCl < 40ml/min.  Current weight pending.  Goal of Therapy:  Eradicate infection.   Plan:  Levaquin 750mg  IV every 48 hours. Follow up culture results  [name] 01/23/2014,6:12 PM

## 2014-01-23 NOTE — H&P (Signed)
[facility] History and Physical  [name] MVH:[id number] DOB: 1955/10/30 DOA: 01/23/2014  Referring physician:  PCP: Glo Herring., MD   Chief Complaint: ams  HPI: [name] is a 58 y.o. male with past medical hx DM HTN, CKD IV, sleep apnea, chronic back pain, CAD, COPD, CHF presents to the emergency department chief complaint of altered mental status. Wife reported that patient has had intermittent episodes of confusion the last several weeks but this is worsened since yesterday. Suffered a fall last week he was seen at [facility] and discharge. She also reports [facility]. She reports he takes pain meds for chronic back pain. Reportedly he changed his fentanyl patch yesterday. He denies chest pain palpitation shortness of breath. He denies abdominal pain nausea vomiting diarrhea constipation melena. He denies dysuria hematuria frequency or urgency. He denies fever chills or recent sick contacts. He does have a moist nonproductive cough and is a heavy smoker.   Initial workup in the emergency department reveals a chest x-ray concerning for pneumonia, ABG with a PO2 of 74, recent metabolic panel with creatinine 2.46 serum glucose 294. Troponin is negative. Complete blood count significant for WBCs of 17.5. Urinalysis with hyaline casts and RBCs. CT of the head and face unremarkable. CT of the cervical spine with degenerative changes otherwise unremarkable.  Emergency Department has a temperature 99.1 he is hemodynamically stable. The emergency department he receives a [place] cervical collar IV fluids and oxygen supplementation.  Review of Systems:  10 point review of systems completed all systems are negative except as indicated in the history of present illness of note information may be unreliable as patient was somewhat lethargic.Marland Kitchen   Past Medical History  Diagnosis Date  . Type 2 diabetes mellitus   .  Essential hypertension, benign   . 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  . CKD (chronic kidney disease) stage 3, GFR 30-59 ml/min   . Arthritis   . Obesity   . Depression   . Sleep apnea   . Diabetic neuropathy   . Coronary atherosclerosis of native coronary artery     Nonobstructive by cardiac catheterization September 2008  . Esophagitis, reflux 07/07/10    MW tear, erosive reflux esophagitis, 2cm hh  . S/P colonoscopy April 2012    Rourk: torturous colon, hyperplastic polyps  . Tremor   . Diverticulum   . Hiatal hernia   . Gastroparesis 11/2010    No emptying at two hours.   . Chronic back pain   . Clonic seizure     Controlled with klonodine  . Acute respiratory failure with hypoxia 06/23/2013    Due to opiate toxicity. Intubated & extubated  . COPD (chronic obstructive pulmonary disease)   . CHF (congestive heart failure)    Past Surgical History  Procedure Laterality Date  . Neck surgery      Discectomy  . Hand surgery      Rght-carpal tunnel  . Colonoscopy  before 2002    [place]  . Appendectomy    . Other surgical history      Bone graft-knee  . Anterior lumbar corpectomy w/ fusion      C5-6/C4-7  . Colonoscopy  08/21/2010    Elongaed tortuous colon otherwise normal  . Esophagogastroduodenoscopy  07/08/10    Dr. Volney American esophageal erosions and excoriations consistent with erosive reflux esophagitis, hiatal hernia  .  Knee arthroscopy      Right  . Esophagogastroduodenoscopy (egd) with propofol  02/25/2012    Procedure: ESOPHAGOGASTRODUODENOSCOPY (EGD) WITH PROPOFOL;  Surgeon: Daneil Dolin, MD;  Location: [facility];  Service: Endoscopy;  Laterality: N/A;  7:30 /POLYPHARMACY   Social History:  reports that he has been smoking Cigarettes.  He has a 135 pack-year smoking history. He has never used smokeless tobacco. He reports that he uses illicit drugs (Marijuana). He reports that he does not drink  alcohol.  Allergies  Allergen Reactions  . Ms Contin [Morphine Sulfate Er]     Doesn't like to take in liquid form( burns and doesn't help) (pill form works fine)     Family History  Problem Relation Age of Onset  . Stroke Father 7  . Stroke Mother 38  . Colon cancer Neg Hx   . Crohn's disease Neg Hx   . Cirrhosis Neg Hx   . Ulcerative colitis Neg Hx   . Stomach cancer Neg Hx   . Emphysema Father     smoked     Prior to Admission medications   Medication Sig Start Date End Date Taking? Authorizing Provider  carvedilol (COREG) 6.25 MG tablet Take 1 tablet (6.25 mg total) by mouth 2 (two) times daily with a meal. 06/04/13  Yes Samuella Cota, MD  clonazePAM (KLONOPIN) 0.5 MG tablet Take 1 mg by mouth 2 (two) times daily as needed for anxiety.    Yes Historical Provider, MD  fentaNYL (DURAGESIC - DOSED MCG/HR) 100 MCG/HR Place 100 mcg onto the skin every 3 (three) days.   Yes Historical Provider, MD  FLUoxetine (PROZAC) 20 MG capsule Take 80 mg by mouth daily.   Yes Historical Provider, MD  furosemide (LASIX) 40 MG tablet Take 1 tablet (40 mg total) by mouth 2 (two) times daily. 06/04/13  Yes Samuella Cota, MD  gabapentin (NEURONTIN) 100 MG capsule Take 300 mg by mouth 3 (three) times daily.   Yes Historical Provider, MD  insulin aspart protamine-insulin aspart (NOVOLOG 70/30) (70-30) 100 UNIT/ML injection Inject 10-20 Units into the skin 2 (two) times daily with a meal. Units vary based on food consumed.   Yes Historical Provider, MD  isosorbide mononitrate (IMDUR) 30 MG 24 hr tablet Take 15 mg by mouth daily.   Yes Historical Provider, MD  morphine (MSIR) 15 MG tablet Take 15 mg by mouth every 4 (four) hours as needed for severe pain.   Yes Historical Provider, MD  Multiple Vitamins-Minerals (CENTRUM SILVER ULTRA MENS) TABS Take 1 tablet by mouth daily.     Yes Historical Provider, MD  pantoprazole (PROTONIX) 40 MG tablet Take 40 mg by mouth daily.   Yes Historical Provider, MD   Potassium Chloride ER 20 MEQ TBCR Take 20 mEq by mouth daily. 06/28/13  Yes Rexene Alberts, MD  rOPINIRole (REQUIP) 3 MG tablet Take 1 tablet (3 mg total) by mouth at bedtime. 06/28/13  Yes Rexene Alberts, MD  senna (SENOKOT) 8.6 MG TABS tablet Take 2 tablets by mouth 2 (two) times daily.   Yes Historical Provider, MD  hydrALAZINE (APRESOLINE) 25 MG tablet Take 1 tablet (25 mg total) by mouth every 8 (eight) hours. 06/28/13   Rexene Alberts, MD  metolazone (ZAROXOLYN) 2.5 MG tablet Take 1 tablet (2.5 mg total) by mouth daily as needed (for increased swelling. After taking a dose, call you cardiologist for further instructions.). 06/04/13   Samuella Cota, MD  nitroGLYCERIN (NITROSTAT) 0.4 MG SL tablet Place 1  tablet (0.4 mg total) under the tongue every 5 (five) minutes as needed for chest pain. 01/03/14   Tanda Rockers, MD  ondansetron (ZOFRAN-ODT) 4 MG disintegrating tablet Take 1 tablet (4 mg total) by mouth every 4 (four) hours as needed for nausea. 01/27/12   Mahala Menghini, PA-C  polyethylene glycol (MIRALAX / GLYCOLAX) packet Take 17 g by mouth daily as needed. For constipation    Historical Provider, MD   Physical Exam: Filed Vitals:   01/23/14 1230 01/23/14 1300 01/23/14 1307 01/23/14 1320  BP: 142/84 140/97    Pulse: 83 80    Temp:   99.1 F (37.3 C) 99.1 F (37.3 C)  TempSrc:   Oral   Resp: 18 15    SpO2: 90% 96%      Wt Readings from Last 3 Encounters:  01/03/14 101.061 kg (222 lb 12.8 oz)  06/28/13 88.2 kg (194 lb 7.1 oz)  06/04/13 95.8 kg (211 lb 3.2 oz)    General:  Appears lethargic but will respond to verbal stimuli, he does fall back asleep easily. Eyes: PERRL, normal lids, irises & conjunctiva left eye with bruising and scratch over upper ENT: grossly normal hearing, his membranes of his mouth are pink slightly dry very poor dentition Neck: no LAD, masses or thyromegaly Cardiovascular: RRR, no m/r/g. No LE edema. Pedal pulse present Respiratory:  Breath sounds distant but  clear bilaterally, no w/r/r. Normal respiratory effort somewhat shallow Abdomen: soft, ntnd positive bowel sounds throughout no guarding no rebounding Skin: no rash or induration seen on limited exam Musculoskeletal: grossly normal tone BUE/BLE Psychiatric: grossly normal mood and affect, speech fluent and appropriate Neurologic: grossly non-focal. Speech slow and deliberate but clear           Labs on Admission:  Basic Metabolic Panel:  Recent Labs Lab 01/23/14 1147  NA 139  K 4.2  CL 102  CO2 26  GLUCOSE 294*  BUN 36*  CREATININE 2.46*  CALCIUM 9.2   Liver Function Tests:  Recent Labs Lab 01/23/14 1147  AST 13  ALT 11  ALKPHOS 93  BILITOT 0.4  PROT 6.8  ALBUMIN 3.2*   No results found for this basename: LIPASE, AMYLASE,  in the last 168 hours No results found for this basename: AMMONIA,  in the last 168 hours CBC:  Recent Labs Lab 01/23/14 1147  WBC 17.5*  NEUTROABS 15.3*  HGB 11.3*  HCT 34.8*  MCV 85.9  PLT 197   Cardiac Enzymes:  Recent Labs Lab 01/23/14 1147  TROPONINI <0.30    BNP (last 3 results)  Recent Labs  05/30/13 2025 06/23/13 1602 06/24/13 0542  PROBNP 9807.0* 2103.0* 9222.0*   CBG: No results found for this basename: GLUCAP,  in the last 168 hours  Radiological Exams on Admission: Ct Head Wo Contrast  01/23/2014   CLINICAL DATA:  Altered mental status possible recent injury  EXAM: CT HEAD WITHOUT CONTRAST  CT MAXILLOFACIAL WITHOUT CONTRAST  CT CERVICAL SPINE WITHOUT CONTRAST  TECHNIQUE: Multidetector CT imaging of the head, cervical spine, and maxillofacial structures were performed using the standard protocol without intravenous contrast. Multiplanar CT image reconstructions of the cervical spine and maxillofacial structures were also generated.  COMPARISON:  06/23/2013  FINDINGS: CT HEAD FINDINGS  The bony calvarium is intact. No findings to suggest acute hemorrhage, acute infarction or space-occupying mass lesion are noted.  Very mild atrophy is identified, stable from the prior exam.  CT MAXILLOFACIAL FINDINGS  The bony structures are within normal  limits. Some dental caries are noted. No paranasal sinus abnormality is seen. No surrounding soft tissue changes are noted. The ostiomeatal complexes are patent bilaterally. Minimal septal deviation to the right is seen.  CT CERVICAL SPINE FINDINGS  There are changes consistent with corpectomy at C5 and C6 with anterior fusion from C4-C7. Scattered degenerative osteophytic changes are seen. No acute fracture or acute facet abnormality is noted. Multilevel facet hypertrophic changes are seen.  IMPRESSION: CT of the head:  Stable atrophy without acute abnormality.  CT of the maxillofacial bones: No acute abnormality is seen.  CT of the cervical spine: Degenerative and postoperative change without acute abnormality.   Electronically Signed   By: Inez Catalina M.D.   On: 01/23/2014 13:29   Ct Cervical Spine Wo Contrast  01/23/2014   CLINICAL DATA:  Altered mental status possible recent injury  EXAM: CT HEAD WITHOUT CONTRAST  CT MAXILLOFACIAL WITHOUT CONTRAST  CT CERVICAL SPINE WITHOUT CONTRAST  TECHNIQUE: Multidetector CT imaging of the head, cervical spine, and maxillofacial structures were performed using the standard protocol without intravenous contrast. Multiplanar CT image reconstructions of the cervical spine and maxillofacial structures were also generated.  COMPARISON:  06/23/2013  FINDINGS: CT HEAD FINDINGS  The bony calvarium is intact. No findings to suggest acute hemorrhage, acute infarction or space-occupying mass lesion are noted. Very mild atrophy is identified, stable from the prior exam.  CT MAXILLOFACIAL FINDINGS  The bony structures are within normal limits. Some dental caries are noted. No paranasal sinus abnormality is seen. No surrounding soft tissue changes are noted. The ostiomeatal complexes are patent bilaterally. Minimal septal deviation to the right is seen.  CT  CERVICAL SPINE FINDINGS  There are changes consistent with corpectomy at C5 and C6 with anterior fusion from C4-C7. Scattered degenerative osteophytic changes are seen. No acute fracture or acute facet abnormality is noted. Multilevel facet hypertrophic changes are seen.  IMPRESSION: CT of the head:  Stable atrophy without acute abnormality.  CT of the maxillofacial bones: No acute abnormality is seen.  CT of the cervical spine: Degenerative and postoperative change without acute abnormality.   Electronically Signed   By: Inez Catalina M.D.   On: 01/23/2014 13:29   Dg Chest Portable 1 View  01/23/2014   CLINICAL DATA:  Altered mental status. Fall. Hypertension. Diabetes.  EXAM: PORTABLE CHEST - 1 VIEW  COMPARISON:  06/28/2013  FINDINGS: Dorsal column stimulator leads appears slightly eccentric to the right although much of this may be because the patient is rotated to the left.  Patchy indistinct airspace opacity with nodular components in the right upper lobe. Indistinct pulmonary vasculature. Upper normal heart size. The lungs appear otherwise clear.  IMPRESSION: 1. Right upper lobe patchy opacities with nodular components favoring early pneumonia. 2. Dorsal column stimulator in the mid thoracic spine.   Electronically Signed   By: Sherryl Barters M.D.   On: 01/23/2014 14:49   Ct Maxillofacial Wo Cm  01/23/2014   CLINICAL DATA:  Altered mental status possible recent injury  EXAM: CT HEAD WITHOUT CONTRAST  CT MAXILLOFACIAL WITHOUT CONTRAST  CT CERVICAL SPINE WITHOUT CONTRAST  TECHNIQUE: Multidetector CT imaging of the head, cervical spine, and maxillofacial structures were performed using the standard protocol without intravenous contrast. Multiplanar CT image reconstructions of the cervical spine and maxillofacial structures were also generated.  COMPARISON:  06/23/2013  FINDINGS: CT HEAD FINDINGS  The bony calvarium is intact. No findings to suggest acute hemorrhage, acute infarction or space-occupying mass  lesion are  noted. Very mild atrophy is identified, stable from the prior exam.  CT MAXILLOFACIAL FINDINGS  The bony structures are within normal limits. Some dental caries are noted. No paranasal sinus abnormality is seen. No surrounding soft tissue changes are noted. The ostiomeatal complexes are patent bilaterally. Minimal septal deviation to the right is seen.  CT CERVICAL SPINE FINDINGS  There are changes consistent with corpectomy at C5 and C6 with anterior fusion from C4-C7. Scattered degenerative osteophytic changes are seen. No acute fracture or acute facet abnormality is noted. Multilevel facet hypertrophic changes are seen.  IMPRESSION: CT of the head:  Stable atrophy without acute abnormality.  CT of the maxillofacial bones: No acute abnormality is seen.  CT of the cervical spine: Degenerative and postoperative change without acute abnormality.   Electronically Signed   By: Inez Catalina M.D.   On: 01/23/2014 13:29    EKG:   Assessment/Plan Principal Problem:   Acute encephalopathy; likely related to overmedication secondary to chronic back pain in setting of CAP. Will admit to telemetry. Provided with narcan and he awakened briefly.  Will hold sedating home medication. Provide supportive therapy i.e gently hydration, antibiotics, oxygen supplementation. Will check lactic acid and ammonia level.  Active Problems: CAP: per chest xray in heavy smoker. Will start levaquin. Currently low grade temp, nontoxic appearing. Check legionella and strep urine antigen. Blood cultures and sputum cultures. Monitor sats  Hypoxia: per ABG. Improved on admission. Related to #1 and #2. Monitor. See #2.    Acute on chronic renal failure: creatinine appears a little above baseline. Hold nephrotoxins. Monitor output.   Leukocytosis likely related to # 2.     Chronic pain syndrome: see #1. Has fentanyl patch on. Will remove. Will monitor for any withdrawal and manage as indicated. Home regimen includes  neurontin, MSIR, klonipin. Will hold these for now.    Chronic systolic heart failure: appears compensated. Will hold lasix. Monitor intake and output and daily weight. Echo 2/15 yields The estimated ejection fraction was in the range of 20% to 25%. Diffuse hypokinesis. There is akinesis of the basalinferoseptal myocardium. Doppler parameters are consistent with abnormal left ventricular relaxation (grade 1 diastolic dysfunction). Gently hydration.  HTN (hypertension): stable on admission. Home meds include coreg, imdur, lasix, apresoline, zaroxolyn. Continue coreg, imdur and zaroxolyn. Monitor.    DM type 2 with diabetic peripheral neuropathy: serum glucose 294. Will obtain A1c. He is on 70/30 at home. Will provide SSI.     Anemia: stable at baseline:    HYPERLIPIDEMIA: lipid panel    GERD: npo until more alert. Continue PPI         Tobacco abuse: cessation counseling      Code Status: full DVT Prophylaxis: Family Communication: wife at bedside Disposition Plan: home when ready  Time spent: 34 minutes  [facility] Pager [phone]

## 2014-01-23 NOTE — ED Notes (Signed)
Confusion for "a few weeks" but has gotten worse since yesterday per wife. Wife reports pt was seen in [place] by [facility] ED x5 days ago for a fall. Pt was also seen in [place] at [facility] ED x2 weeks ago for c/o same. Wife reports pts balance "has been off" and decreased appetite.

## 2014-01-23 NOTE — ED Notes (Signed)
Pt given Narcan and is now alert and oriented. Speaking full sentences stating he wants pain medications.

## 2014-01-23 NOTE — ED Provider Notes (Signed)
CSN: [id number]     Arrival date & time 01/23/14  1045 History  This chart was scribed for Larry Cable, MD by Delphia Grates, ED Scribe. This patient was seen in room APA14/APA14 and the patient's care was started at 11:30 AM.    Chief Complaint  Patient presents with  . Altered Mental Status   Level 5 Caveat: Altered Mental Status  Patient is a 58 y.o. male presenting with altered mental status. The history is provided by the patient and the spouse. The history is limited by the condition of the patient. No language interpreter was used.  Altered Mental Status Most recent episode:  Yesterday Episode history:  Multiple Duration:  1 day Timing:  Constant Progression:  Worsening Chronicity:  Recurrent   HPI Comments: [name] is a 58 y.o. male who presents to the Emergency Department complaining of altered mental status onset yesterday. Per wife, patient has been experiencing confusion for several weeks, but states this has gotten worse since yesterday. There is associated loss of balance and HA. She states the patient was seen at [facility] 2 weeks ago for the same. She reports he was also seen at [facility] for a fall several days ago, however, a CT scan was not performed at that time. Wife reports patient takes pain medications for his chronic neck and back pain, however she states he does not take "as much as he is supposed to". She reports he change his Fentanyl patch last night, but is unsure if he took his pain medications PTA. Patient has history of DM, CKD, CHF, and COPD.      Past Medical History  Diagnosis Date  . Type 2 diabetes mellitus   . Essential hypertension, benign   . 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  . CKD (chronic kidney disease) stage 3, GFR 30-59 ml/min   . Arthritis   . Obesity   . Depression   . Sleep apnea   . Diabetic neuropathy   . Coronary atherosclerosis of native coronary artery      Nonobstructive by cardiac catheterization September 2008  . Esophagitis, reflux 07/07/10    MW tear, erosive reflux esophagitis, 2cm hh  . S/P colonoscopy April 2012    Rourk: torturous colon, hyperplastic polyps  . Tremor   . Diverticulum   . Hiatal hernia   . Gastroparesis 11/2010    No emptying at two hours.   . Chronic back pain   . Clonic seizure     Controlled with klonodine  . Acute respiratory failure with hypoxia 06/23/2013    Due to opiate toxicity. Intubated & extubated  . COPD (chronic obstructive pulmonary disease)   . CHF (congestive heart failure)    Past Surgical History  Procedure Laterality Date  . Neck surgery      Discectomy  . Hand surgery      Rght-carpal tunnel  . Colonoscopy  before 2002    [place]  . Appendectomy    . Other surgical history      Bone graft-knee  . Anterior lumbar corpectomy w/ fusion      C5-6/C4-7  . Colonoscopy  08/21/2010    Elongaed tortuous colon otherwise normal  . Esophagogastroduodenoscopy  07/08/10    Dr. Volney American esophageal erosions and excoriations consistent with erosive reflux esophagitis, hiatal hernia  . Knee arthroscopy      Right  . Esophagogastroduodenoscopy (egd) with propofol  02/25/2012  Procedure: ESOPHAGOGASTRODUODENOSCOPY (EGD) WITH PROPOFOL;  Surgeon: Daneil Dolin, MD;  Location: [facility];  Service: Endoscopy;  Laterality: N/A;  7:30 /POLYPHARMACY   Family History  Problem Relation Age of Onset  . Stroke Father 68  . Stroke Mother 68  . Colon cancer Neg Hx   . Crohn's disease Neg Hx   . Cirrhosis Neg Hx   . Ulcerative colitis Neg Hx   . Stomach cancer Neg Hx   . Emphysema Father     smoked   History  Substance Use Topics  . Smoking status: Current Every Day Smoker -- 3.00 packs/day for 45 years    Types: Cigarettes  . Smokeless tobacco: Never Used     Comment: currently smoking 3 cigs per day and also uses VAPE 01/03/14  . Alcohol Use: No    Review of Systems  Unable to perform ROS:  Mental status change      Allergies  Ms contin  Home Medications   Prior to Admission medications   Medication Sig Start Date End Date Taking? Authorizing Provider  carvedilol (COREG) 6.25 MG tablet Take 1 tablet (6.25 mg total) by mouth 2 (two) times daily with a meal. 06/04/13  Yes Samuella Cota, MD  clonazePAM (KLONOPIN) 0.5 MG tablet Take 1 mg by mouth 2 (two) times daily as needed for anxiety.    Yes Historical Provider, MD  fentaNYL (DURAGESIC - DOSED MCG/HR) 100 MCG/HR Place 100 mcg onto the skin every 3 (three) days.   Yes Historical Provider, MD  FLUoxetine (PROZAC) 20 MG capsule Take 80 mg by mouth daily.   Yes Historical Provider, MD  furosemide (LASIX) 40 MG tablet Take 1 tablet (40 mg total) by mouth 2 (two) times daily. 06/04/13  Yes Samuella Cota, MD  gabapentin (NEURONTIN) 100 MG capsule Take 300 mg by mouth 3 (three) times daily.   Yes Historical Provider, MD  insulin aspart protamine-insulin aspart (NOVOLOG 70/30) (70-30) 100 UNIT/ML injection Inject 10-20 Units into the skin 2 (two) times daily with a meal. Units vary based on food consumed.   Yes Historical Provider, MD  isosorbide mononitrate (IMDUR) 30 MG 24 hr tablet Take 15 mg by mouth daily.   Yes Historical Provider, MD  morphine (MSIR) 15 MG tablet Take 15 mg by mouth every 4 (four) hours as needed for severe pain.   Yes Historical Provider, MD  Multiple Vitamins-Minerals (CENTRUM SILVER ULTRA MENS) TABS Take 1 tablet by mouth daily.     Yes Historical Provider, MD  pantoprazole (PROTONIX) 40 MG tablet Take 40 mg by mouth daily.   Yes Historical Provider, MD  Potassium Chloride ER 20 MEQ TBCR Take 20 mEq by mouth daily. 06/28/13  Yes Rexene Alberts, MD  rOPINIRole (REQUIP) 3 MG tablet Take 1 tablet (3 mg total) by mouth at bedtime. 06/28/13  Yes Rexene Alberts, MD  senna (SENOKOT) 8.6 MG TABS tablet Take 2 tablets by mouth 2 (two) times daily.   Yes Historical Provider, MD  hydrALAZINE (APRESOLINE) 25 MG tablet Take  1 tablet (25 mg total) by mouth every 8 (eight) hours. 06/28/13   Rexene Alberts, MD  metolazone (ZAROXOLYN) 2.5 MG tablet Take 1 tablet (2.5 mg total) by mouth daily as needed (for increased swelling. After taking a dose, call you cardiologist for further instructions.). 06/04/13   Samuella Cota, MD  nitroGLYCERIN (NITROSTAT) 0.4 MG SL tablet Place 1 tablet (0.4 mg total) under the tongue every 5 (five) minutes as needed for chest pain. 01/03/14  Tanda Rockers, MD  ondansetron (ZOFRAN-ODT) 4 MG disintegrating tablet Take 1 tablet (4 mg total) by mouth every 4 (four) hours as needed for nausea. 01/27/12   Mahala Menghini, PA-C  polyethylene glycol (MIRALAX / GLYCOLAX) packet Take 17 g by mouth daily as needed. For constipation    Historical Provider, MD   BP 140/97  Pulse 80  Temp(Src) 99.1 F (37.3 C) (Oral)  Resp 15  SpO2 96% Physical Exam CONSTITUTIONAL: Somnolent, but arousable HEAD: Normocephalic EYES: EOMI/PERRL, Left periorbital edema and bruising noted ENMT: Mucous membranes moist, no signs of nasal trauma NECK: supple no meningeal signs SPINE:entire spine nontender CV: S1/S2 noted, no murmurs/rubs/gallops noted LUNGS: Lungs are clear to auscultation bilaterally, no apparent distress ABDOMEN: soft, nontender, no rebound or guarding GU:no cva tenderness NEURO: patient is somnolent but arousable. He follows commands but goes right back to sleep. No focal arm or leg drift noted when he is awake EXTREMITIES: pulses normal, full ROM, no signs of acute trauma to extremities SKIN: warm, color normal PSYCH: unable to assess  ED Course  Procedures   DIAGNOSTIC STUDIES: Oxygen Saturation is 95% on room air, adequate by my interpretation.    COORDINATION OF CARE: At 61 Discussed treatment plan with patient which includes imaging and labs. Patient agrees.  2:25 PM All imaging negative (unable to clear cspine initially as pt somnolent and recent head injury) He appears more awake while  sitting, but then when back on bed he becomes somnolent again and his respiratory effort slows.  I suspect this is pain medication related (pt on morphine/fentanyl) but wife feels there is "something else" I will admit for observation D/w dr Roderic Palau, will admit to tele OBS He requests adding on CXR due to elevated WBC  Labs Review Labs Reviewed  CBC - Abnormal; Notable for the following:    WBC 17.5 (*)    RBC 4.05 (*)    Hemoglobin 11.3 (*)    HCT 34.8 (*)    All other components within normal limits  DIFFERENTIAL - Abnormal; Notable for the following:    Neutrophils Relative % 87 (*)    Neutro Abs 15.3 (*)    Lymphocytes Relative 6 (*)    All other components within normal limits  COMPREHENSIVE METABOLIC PANEL - Abnormal; Notable for the following:    Glucose, Bld 294 (*)    BUN 36 (*)    Creatinine, Ser 2.46 (*)    Albumin 3.2 (*)    GFR calc non Af Amer 27 (*)    GFR calc Af Amer 31 (*)    All other components within normal limits  URINE RAPID DRUG SCREEN (HOSP PERFORMED) - Abnormal; Notable for the following:    Opiates POSITIVE (*)    All other components within normal limits  URINALYSIS, ROUTINE W REFLEX MICROSCOPIC - Abnormal; Notable for the following:    Glucose, UA 250 (*)    Hgb urine dipstick MODERATE (*)    Protein, ur 100 (*)    All other components within normal limits  BLOOD GAS, ARTERIAL - Abnormal; Notable for the following:    pCO2 arterial 45.7 (*)    pO2, Arterial 74.7 (*)    Bicarbonate 26.2 (*)    All other components within normal limits  URINE MICROSCOPIC-ADD ON - Abnormal; Notable for the following:    Casts HYALINE CASTS (*)    All other components within normal limits  ETHANOL  PROTIME-INR  APTT  TROPONIN I    Imaging  Review Ct Head Wo Contrast  01/23/2014   CLINICAL DATA:  Altered mental status possible recent injury  EXAM: CT HEAD WITHOUT CONTRAST  CT MAXILLOFACIAL WITHOUT CONTRAST  CT CERVICAL SPINE WITHOUT CONTRAST  TECHNIQUE:  Multidetector CT imaging of the head, cervical spine, and maxillofacial structures were performed using the standard protocol without intravenous contrast. Multiplanar CT image reconstructions of the cervical spine and maxillofacial structures were also generated.  COMPARISON:  06/23/2013  FINDINGS: CT HEAD FINDINGS  The bony calvarium is intact. No findings to suggest acute hemorrhage, acute infarction or space-occupying mass lesion are noted. Very mild atrophy is identified, stable from the prior exam.  CT MAXILLOFACIAL FINDINGS  The bony structures are within normal limits. Some dental caries are noted. No paranasal sinus abnormality is seen. No surrounding soft tissue changes are noted. The ostiomeatal complexes are patent bilaterally. Minimal septal deviation to the right is seen.  CT CERVICAL SPINE FINDINGS  There are changes consistent with corpectomy at C5 and C6 with anterior fusion from C4-C7. Scattered degenerative osteophytic changes are seen. No acute fracture or acute facet abnormality is noted. Multilevel facet hypertrophic changes are seen.  IMPRESSION: CT of the head:  Stable atrophy without acute abnormality.  CT of the maxillofacial bones: No acute abnormality is seen.  CT of the cervical spine: Degenerative and postoperative change without acute abnormality.   Electronically Signed   By: Inez Catalina M.D.   On: 01/23/2014 13:29   Ct Cervical Spine Wo Contrast  01/23/2014   CLINICAL DATA:  Altered mental status possible recent injury  EXAM: CT HEAD WITHOUT CONTRAST  CT MAXILLOFACIAL WITHOUT CONTRAST  CT CERVICAL SPINE WITHOUT CONTRAST  TECHNIQUE: Multidetector CT imaging of the head, cervical spine, and maxillofacial structures were performed using the standard protocol without intravenous contrast. Multiplanar CT image reconstructions of the cervical spine and maxillofacial structures were also generated.  COMPARISON:  06/23/2013  FINDINGS: CT HEAD FINDINGS  The bony calvarium is intact. No  findings to suggest acute hemorrhage, acute infarction or space-occupying mass lesion are noted. Very mild atrophy is identified, stable from the prior exam.  CT MAXILLOFACIAL FINDINGS  The bony structures are within normal limits. Some dental caries are noted. No paranasal sinus abnormality is seen. No surrounding soft tissue changes are noted. The ostiomeatal complexes are patent bilaterally. Minimal septal deviation to the right is seen.  CT CERVICAL SPINE FINDINGS  There are changes consistent with corpectomy at C5 and C6 with anterior fusion from C4-C7. Scattered degenerative osteophytic changes are seen. No acute fracture or acute facet abnormality is noted. Multilevel facet hypertrophic changes are seen.  IMPRESSION: CT of the head:  Stable atrophy without acute abnormality.  CT of the maxillofacial bones: No acute abnormality is seen.  CT of the cervical spine: Degenerative and postoperative change without acute abnormality.   Electronically Signed   By: Inez Catalina M.D.   On: 01/23/2014 13:29   Ct Maxillofacial Wo Cm  01/23/2014   CLINICAL DATA:  Altered mental status possible recent injury  EXAM: CT HEAD WITHOUT CONTRAST  CT MAXILLOFACIAL WITHOUT CONTRAST  CT CERVICAL SPINE WITHOUT CONTRAST  TECHNIQUE: Multidetector CT imaging of the head, cervical spine, and maxillofacial structures were performed using the standard protocol without intravenous contrast. Multiplanar CT image reconstructions of the cervical spine and maxillofacial structures were also generated.  COMPARISON:  06/23/2013  FINDINGS: CT HEAD FINDINGS  The bony calvarium is intact. No findings to suggest acute hemorrhage, acute infarction or space-occupying mass lesion are  noted. Very mild atrophy is identified, stable from the prior exam.  CT MAXILLOFACIAL FINDINGS  The bony structures are within normal limits. Some dental caries are noted. No paranasal sinus abnormality is seen. No surrounding soft tissue changes are noted. The  ostiomeatal complexes are patent bilaterally. Minimal septal deviation to the right is seen.  CT CERVICAL SPINE FINDINGS  There are changes consistent with corpectomy at C5 and C6 with anterior fusion from C4-C7. Scattered degenerative osteophytic changes are seen. No acute fracture or acute facet abnormality is noted. Multilevel facet hypertrophic changes are seen.  IMPRESSION: CT of the head:  Stable atrophy without acute abnormality.  CT of the maxillofacial bones: No acute abnormality is seen.  CT of the cervical spine: Degenerative and postoperative change without acute abnormality.   Electronically Signed   By: Inez Catalina M.D.   On: 01/23/2014 13:29     Date: 01/23/2014 1114am  Rate: 77  Rhythm: normal sinus rhythm  QRS Axis: normal  Intervals: normal  ST/T Wave abnormalities: nonspecific ST changes  Conduction Disutrbances:nonspecific intraventricular conduction delay  Narrative Interpretation:   Old EKG Reviewed: unchanged    MDM   Final diagnoses:  Altered mental status, unspecified altered mental status type  AKI (acute kidney injury)  Hyperglycemia    Nursing notes including past medical history and social history reviewed and considered in documentation Labs/vital reviewed and considered   I personally performed the services described in this documentation, which was scribed in my presence. The recorded information has been reviewed and is accurate.      Larry Cable, MD 01/23/14 [phone]

## 2014-01-24 DIAGNOSIS — R4182 Altered mental status, unspecified: Secondary | ICD-10-CM

## 2014-01-24 LAB — COMPREHENSIVE METABOLIC PANEL
ALT: 10 U/L (ref 0–53)
AST: 12 U/L (ref 0–37)
Albumin: 2.8 g/dL — ABNORMAL LOW (ref 3.5–5.2)
Alkaline Phosphatase: 87 U/L (ref 39–117)
Anion gap: 10 (ref 5–15)
BILIRUBIN TOTAL: 0.5 mg/dL (ref 0.3–1.2)
BUN: 27 mg/dL — ABNORMAL HIGH (ref 6–23)
CO2: 26 meq/L (ref 19–32)
Calcium: 8.8 mg/dL (ref 8.4–10.5)
Chloride: 103 mEq/L (ref 96–112)
Creatinine, Ser: 1.75 mg/dL — ABNORMAL HIGH (ref 0.50–1.35)
GFR calc Af Amer: 47 mL/min — ABNORMAL LOW (ref >90)
GFR calc non Af Amer: 41 mL/min — ABNORMAL LOW (ref >90)
GLUCOSE: 234 mg/dL — AB (ref 70–99)
POTASSIUM: 4 meq/L (ref 3.7–5.3)
SODIUM: 139 meq/L (ref 137–147)
Total Protein: 6.4 g/dL (ref 6.0–8.3)

## 2014-01-24 LAB — GLUCOSE, CAPILLARY
GLUCOSE-CAPILLARY: 243 mg/dL — AB (ref 70–99)
Glucose-Capillary: 235 mg/dL — ABNORMAL HIGH (ref 70–99)

## 2014-01-24 LAB — HEMOGLOBIN A1C
HEMOGLOBIN A1C: 10 % — AB (ref <5.7)
Mean Plasma Glucose: 240 mg/dL — ABNORMAL HIGH (ref <117)

## 2014-01-24 LAB — CBC
HEMATOCRIT: 34.4 % — AB (ref 39.0–52.0)
Hemoglobin: 11.5 g/dL — ABNORMAL LOW (ref 13.0–17.0)
MCH: 28.6 pg (ref 26.0–34.0)
MCHC: 33.4 g/dL (ref 30.0–36.0)
MCV: 85.6 fL (ref 78.0–100.0)
Platelets: 200 10*3/uL (ref 150–400)
RBC: 4.02 MIL/uL — ABNORMAL LOW (ref 4.22–5.81)
RDW: 14 % (ref 11.5–15.5)
WBC: 13.8 10*3/uL — ABNORMAL HIGH (ref 4.0–10.5)

## 2014-01-24 MED ORDER — INSULIN ASPART PROT & ASPART (70-30 MIX) 100 UNIT/ML ~~LOC~~ SUSP: 18.0000 [IU] | Freq: Every day | SUBCUTANEOUS | Status: DC

## 2014-01-24 MED ORDER — INSULIN DETEMIR 100 UNIT/ML ~~LOC~~ SOLN
8.0000 [IU] | Freq: Every day | SUBCUTANEOUS | Status: DC
  Filled 2014-01-24: qty 0.08

## 2014-01-24 MED ORDER — LEVOFLOXACIN 750 MG PO TABS: 750.0000 mg | ORAL_TABLET | ORAL | Status: DC

## 2014-01-24 MED ORDER — CLONAZEPAM 0.5 MG PO TABS: 0.5000 mg | ORAL_TABLET | Freq: Two times a day (BID) | ORAL | Status: AC | PRN

## 2014-01-24 MED ORDER — INSULIN ASPART PROT & ASPART (70-30 MIX) 100 UNIT/ML ~~LOC~~ SUSP: 22.0000 [IU] | Freq: Every day | SUBCUTANEOUS | Status: DC

## 2014-01-24 MED ORDER — MORPHINE SULFATE 15 MG PO TABS: 15.0000 mg | ORAL_TABLET | Freq: Two times a day (BID) | ORAL | Status: DC | PRN

## 2014-01-24 NOTE — Progress Notes (Signed)
Pt given discharge instructions and IV removed from left arm. Pt aware of new medication schedule and is aware when it is necessary to call the doctor or 911 for help.

## 2014-01-24 NOTE — Progress Notes (Signed)
Inpatient Diabetes Program Recommendations  AACE/ADA: New Consensus Statement on Inpatient Glycemic Control (2013)  Target Ranges:  Prepandial:   less than 140 mg/dL      Peak postprandial:   less than 180 mg/dL (1-2 hours)      Critically ill patients:  140 - 180 mg/dL   Results for BRIGHAM, COBBINS (MRN [id number]) as of 01/24/2014 07:37  Ref. Range 01/23/2014 11:47 01/24/2014 06:04  Hemoglobin A1C Latest Range: <5.7 % 10.0 (H)   Glucose Latest Range: 70-99 mg/dL 294 (H) 234 (H)   Diabetes history: DM2 Outpatient Diabetes medications: Novolog 70/30 10-20 units BID Current orders for Inpatient glycemic control: Novolog 0-20 units AC, Novolog 0-5 units HS  Inpatient Diabetes Program Recommendations Insulin - Basal: While inpatient please consider ordering Levemir as basal insulin. Recommend starting with Levemir 10 units daily (based on 101 kg x 0.1 units). HgbA1C: Noted A1C 10% on 01/23/14.  Thanks, Barnie Alderman, RN, MSN, CCRN Diabetes Coordinator Inpatient Diabetes Program [phone] (Team Pager) [phone] ([facility] office) [phone] [facility] office)

## 2014-01-25 LAB — URINE CULTURE
Colony Count: NO GROWTH
Culture: NO GROWTH

## 2014-01-25 LAB — LEGIONELLA ANTIGEN, URINE

## 2014-01-25 LAB — STREP PNEUMONIAE URINARY ANTIGEN: Strep Pneumo Urinary Antigen: NEGATIVE

## 2014-01-26 ENCOUNTER — Ambulatory Visit: Payer: Non-veteran care | Admitting: Cardiovascular Disease

## 2014-01-28 NOTE — Discharge Summary (Signed)
Physician Discharge Summary  [name] YQM:[id number] DOB: 1955-12-18 DOA: 01/23/2014  PCP: Glo Herring., MD  Admit date: 01/23/2014 Discharge date: 01/24/2014  Time spent: 30 minutes  Recommendations for Outpatient Follow-up:  1. Follow up with PCP in one week.   Discharge Diagnoses:  Principal Problem:   Acute encephalopathy Active Problems:   HYPERLIPIDEMIA   GERD   CKD (chronic kidney disease), stage III   Leukocytosis   OSA on CPAP   Chronic systolic heart failure   HTN (hypertension)   DM type 2 with diabetic peripheral neuropathy   Anemia   Altered mental status   Gastroparesis   Acute on chronic renal failure   Chronic pain syndrome   Tobacco abuse   Hypoxia   PNA (pneumonia)   Discharge Condition: improved  Diet recommendation: heart healthy diet.   Filed Weights   01/24/14 1024  Weight: 90.538 kg (199 lb 9.6 oz)    History of present illness:  [name] is a 58 y.o. male with past medical hx DM HTN, CKD IV, sleep apnea, chronic back pain, CAD, COPD, CHF presents to the emergency department chief complaint of altered mental status. Wife reported that patient has had intermittent episodes of confusion the last several weeks but this is worsened since yesterday.   Hospital Course:  Acute encephalopathy; likely related to overmedication secondary to chronic back pain in setting of CAP. He was admitted to telemetry and all his sedative medication doses have been decreased. His mental status has improved.   CAP: continue levaquin on discharge.  Hypoxia: resolved.  Acute on chronic renal failure: creatinine appears a little above baseline.follow up with repeat bmp in one week. Leukocytosis likely related to # 2.  Chronic pain syndrome: see #1 decreased the doses of the MSIR, and klonipin and stopped fentanyl. Chronic systolic heart failure: appears compensated.  Marland Kitchen  HTN (hypertension): stable on admission. Home meds include coreg, imdur, lasix,  apresoline, zaroxolyn. Continue coreg, imdur and zaroxolyn.  DM type 2 with diabetic peripheral neuropathy: resume home medications.  Anemia: stable at baseline:  HYPERLIPIDEMIA: lipid panel  GERD: npo until more alert. Continue PPI   Procedures:  none  Consultations:  none  Discharge Exam: Filed Vitals:   01/24/14 0527  BP: 144/65  Pulse: 68  Temp: 98 F (36.7 C)  Resp: 20    General: alert afebrile comfortable Cardiovascular: s1s2 Respiratory: ctab  Discharge Instructions You were cared for by a hospitalist during your hospital stay. If you have any questions about your discharge medications or the care you received while you were in the hospital after you are discharged, you can call the unit and asked to speak with the hospitalist on call if the hospitalist that took care of you is not available. Once you are discharged, your primary care physician will handle any further medical issues. Please note that NO REFILLS for any discharge medications will be authorized once you are discharged, as it is imperative that you return to your primary care physician (or establish a relationship with a primary care physician if you do not have one) for your aftercare needs so that they can reassess your need for medications and monitor your lab values.  Discharge Instructions   Discharge instructions    Complete by:  As directed   Follow up with PCP in one week.          Discharge Medication List as of 01/24/2014  1:27 PM    START taking these medications  Details  levofloxacin (LEVAQUIN) 750 MG tablet Take 1 tablet (750 mg total) by mouth every other day., Starting 01/24/2014, Until Discontinued, Print      CONTINUE these medications which have CHANGED   Details  clonazePAM (KLONOPIN) 0.5 MG tablet Take 1 tablet (0.5 mg total) by mouth 2 (two) times daily as needed for anxiety., Starting 01/24/2014, Until Discontinued, No Print    morphine (MSIR) 15 MG tablet Take 1 tablet (15  mg total) by mouth every 12 (twelve) hours as needed for severe pain., Starting 01/24/2014, Until Discontinued, No Print      CONTINUE these medications which have NOT CHANGED   Details  carvedilol (COREG) 6.25 MG tablet Take 1 tablet (6.25 mg total) by mouth 2 (two) times daily with a meal., Starting 06/04/2013, Until Discontinued, Print    FLUoxetine (PROZAC) 20 MG capsule Take 80 mg by mouth daily., Until Discontinued, Historical Med    furosemide (LASIX) 40 MG tablet Take 1 tablet (40 mg total) by mouth 2 (two) times daily., Starting 06/04/2013, Until Discontinued, No Print    gabapentin (NEURONTIN) 100 MG capsule Take 300 mg by mouth 3 (three) times daily., Until Discontinued, Historical Med    insulin aspart protamine-insulin aspart (NOVOLOG 70/30) (70-30) 100 UNIT/ML injection Inject 10-20 Units into the skin 2 (two) times daily with a meal. Units vary based on food consumed., Until Discontinued, Historical Med    isosorbide mononitrate (IMDUR) 30 MG 24 hr tablet Take 15 mg by mouth daily., Until Discontinued, Historical Med    Multiple Vitamins-Minerals (CENTRUM SILVER ULTRA MENS) TABS Take 1 tablet by mouth daily.  , Until Discontinued, Historical Med    pantoprazole (PROTONIX) 40 MG tablet Take 40 mg by mouth daily., Until Discontinued, Historical Med    Potassium Chloride ER 20 MEQ TBCR Take 20 mEq by mouth daily., Starting 06/28/2013, Until Discontinued, Historical Med    rOPINIRole (REQUIP) 3 MG tablet Take 1 tablet (3 mg total) by mouth at bedtime., Starting 06/28/2013, Until Discontinued, No Print    senna (SENOKOT) 8.6 MG TABS tablet Take 2 tablets by mouth 2 (two) times daily., Until Discontinued, Historical Med    hydrALAZINE (APRESOLINE) 25 MG tablet Take 1 tablet (25 mg total) by mouth every 8 (eight) hours., Starting 06/28/2013, Until Discontinued, Print    metolazone (ZAROXOLYN) 2.5 MG tablet Take 1 tablet (2.5 mg total) by mouth daily as needed (for increased swelling. After  taking a dose, call you cardiologist for further instructions.)., Starting 06/04/2013, Until Discontinued, Print    nitroGLYCERIN (NITROSTAT) 0.4 MG SL tablet Place 1 tablet (0.4 mg total) under the tongue every 5 (five) minutes as needed for chest pain., Starting 01/03/2014, Until Discontinued, Print    ondansetron (ZOFRAN-ODT) 4 MG disintegrating tablet Take 1 tablet (4 mg total) by mouth every 4 (four) hours as needed for nausea., Starting 01/27/2012, Until Discontinued, Normal    polyethylene glycol (MIRALAX / GLYCOLAX) packet Take 17 g by mouth daily as needed. For constipation, Until Discontinued, Historical Med      STOP taking these medications     fentaNYL (DURAGESIC - DOSED MCG/HR) 100 MCG/HR        Allergies  Allergen Reactions  . Ms Contin [Morphine Sulfate Er]     Doesn't like to take in liquid form( burns and doesn't help) (pill form works fine)    Follow-up Information   Follow up with Glo Herring., MD. Schedule an appointment as soon as possible for a visit in 1 week.  Specialty:  Internal Medicine   Contact information:   [address] [place]  [address] [phone]        The results of significant diagnostics from this hospitalization (including imaging, microbiology, ancillary and laboratory) are listed below for reference.    Significant Diagnostic Studies: Ct Head Wo Contrast  01/23/2014   CLINICAL DATA:  Altered mental status possible recent injury  EXAM: CT HEAD WITHOUT CONTRAST  CT MAXILLOFACIAL WITHOUT CONTRAST  CT CERVICAL SPINE WITHOUT CONTRAST  TECHNIQUE: Multidetector CT imaging of the head, cervical spine, and maxillofacial structures were performed using the standard protocol without intravenous contrast. Multiplanar CT image reconstructions of the cervical spine and maxillofacial structures were also generated.  COMPARISON:  06/23/2013  FINDINGS: CT HEAD FINDINGS  The bony calvarium is intact. No findings to suggest acute hemorrhage,  acute infarction or space-occupying mass lesion are noted. Very mild atrophy is identified, stable from the prior exam.  CT MAXILLOFACIAL FINDINGS  The bony structures are within normal limits. Some dental caries are noted. No paranasal sinus abnormality is seen. No surrounding soft tissue changes are noted. The ostiomeatal complexes are patent bilaterally. Minimal septal deviation to the right is seen.  CT CERVICAL SPINE FINDINGS  There are changes consistent with corpectomy at C5 and C6 with anterior fusion from C4-C7. Scattered degenerative osteophytic changes are seen. No acute fracture or acute facet abnormality is noted. Multilevel facet hypertrophic changes are seen.  IMPRESSION: CT of the head:  Stable atrophy without acute abnormality.  CT of the maxillofacial bones: No acute abnormality is seen.  CT of the cervical spine: Degenerative and postoperative change without acute abnormality.   Electronically Signed   By: Inez Catalina M.D.   On: 01/23/2014 13:29   Ct Cervical Spine Wo Contrast  01/23/2014   CLINICAL DATA:  Altered mental status possible recent injury  EXAM: CT HEAD WITHOUT CONTRAST  CT MAXILLOFACIAL WITHOUT CONTRAST  CT CERVICAL SPINE WITHOUT CONTRAST  TECHNIQUE: Multidetector CT imaging of the head, cervical spine, and maxillofacial structures were performed using the standard protocol without intravenous contrast. Multiplanar CT image reconstructions of the cervical spine and maxillofacial structures were also generated.  COMPARISON:  06/23/2013  FINDINGS: CT HEAD FINDINGS  The bony calvarium is intact. No findings to suggest acute hemorrhage, acute infarction or space-occupying mass lesion are noted. Very mild atrophy is identified, stable from the prior exam.  CT MAXILLOFACIAL FINDINGS  The bony structures are within normal limits. Some dental caries are noted. No paranasal sinus abnormality is seen. No surrounding soft tissue changes are noted. The ostiomeatal complexes are patent  bilaterally. Minimal septal deviation to the right is seen.  CT CERVICAL SPINE FINDINGS  There are changes consistent with corpectomy at C5 and C6 with anterior fusion from C4-C7. Scattered degenerative osteophytic changes are seen. No acute fracture or acute facet abnormality is noted. Multilevel facet hypertrophic changes are seen.  IMPRESSION: CT of the head:  Stable atrophy without acute abnormality.  CT of the maxillofacial bones: No acute abnormality is seen.  CT of the cervical spine: Degenerative and postoperative change without acute abnormality.   Electronically Signed   By: Inez Catalina M.D.   On: 01/23/2014 13:29   Dg Chest Portable 1 View  01/23/2014   CLINICAL DATA:  Altered mental status. Fall. Hypertension. Diabetes.  EXAM: PORTABLE CHEST - 1 VIEW  COMPARISON:  06/28/2013  FINDINGS: Dorsal column stimulator leads appears slightly eccentric to the right although much of this may be because the patient  is rotated to the left.  Patchy indistinct airspace opacity with nodular components in the right upper lobe. Indistinct pulmonary vasculature. Upper normal heart size. The lungs appear otherwise clear.  IMPRESSION: 1. Right upper lobe patchy opacities with nodular components favoring early pneumonia. 2. Dorsal column stimulator in the mid thoracic spine.   Electronically Signed   By: Sherryl Barters M.D.   On: 01/23/2014 14:49   Ct Maxillofacial Wo Cm  01/23/2014   CLINICAL DATA:  Altered mental status possible recent injury  EXAM: CT HEAD WITHOUT CONTRAST  CT MAXILLOFACIAL WITHOUT CONTRAST  CT CERVICAL SPINE WITHOUT CONTRAST  TECHNIQUE: Multidetector CT imaging of the head, cervical spine, and maxillofacial structures were performed using the standard protocol without intravenous contrast. Multiplanar CT image reconstructions of the cervical spine and maxillofacial structures were also generated.  COMPARISON:  06/23/2013  FINDINGS: CT HEAD FINDINGS  The bony calvarium is intact. No findings to  suggest acute hemorrhage, acute infarction or space-occupying mass lesion are noted. Very mild atrophy is identified, stable from the prior exam.  CT MAXILLOFACIAL FINDINGS  The bony structures are within normal limits. Some dental caries are noted. No paranasal sinus abnormality is seen. No surrounding soft tissue changes are noted. The ostiomeatal complexes are patent bilaterally. Minimal septal deviation to the right is seen.  CT CERVICAL SPINE FINDINGS  There are changes consistent with corpectomy at C5 and C6 with anterior fusion from C4-C7. Scattered degenerative osteophytic changes are seen. No acute fracture or acute facet abnormality is noted. Multilevel facet hypertrophic changes are seen.  IMPRESSION: CT of the head:  Stable atrophy without acute abnormality.  CT of the maxillofacial bones: No acute abnormality is seen.  CT of the cervical spine: Degenerative and postoperative change without acute abnormality.   Electronically Signed   By: Inez Catalina M.D.   On: 01/23/2014 13:29    Microbiology: Recent Results (from the past 240 hour(s))  URINE CULTURE     Status: None   Collection Time    01/24/14 12:27 PM      Result Value Ref Range Status   Specimen Description URINE, CLEAN CATCH   Final   Special Requests NONE   Final   Culture  Setup Time     Final   Value: 01/24/2014 18:17     Performed at [facility]     Final   Value: NO GROWTH     Performed at [facility]   Culture     Final   Value: NO GROWTH     Performed at [facility]   Report Status 01/25/2014 FINAL   Final     Labs: Basic Metabolic Panel:  Recent Labs Lab 01/23/14 1147 01/24/14 0604  NA 139 139  K 4.2 4.0  CL 102 103  CO2 26 26  GLUCOSE 294* 234*  BUN 36* 27*  CREATININE 2.46* 1.75*  CALCIUM 9.2 8.8   Liver Function Tests:  Recent Labs Lab 01/23/14 1147 01/24/14 0604  AST 13  15 12   ALT 11  11 10   ALKPHOS 93  93 87  BILITOT 0.4  0.4 0.5  PROT 6.8   6.8 6.4  ALBUMIN 3.2*  3.2* 2.8*   No results found for this basename: LIPASE, AMYLASE,  in the last 168 hours No results found for this basename: AMMONIA,  in the last 168 hours CBC:  Recent Labs Lab 01/23/14 1147 01/24/14 0604  WBC 17.5* 13.8*  NEUTROABS 15.3*  --  HGB 11.3* 11.5*  HCT 34.8* 34.4*  MCV 85.9 85.6  PLT 197 200   Cardiac Enzymes:  Recent Labs Lab 01/23/14 1147  TROPONINI <0.30   BNP: BNP (last 3 results)  Recent Labs  05/30/13 2025 06/23/13 1602 06/24/13 0542  PROBNP 9807.0* 2103.0* 9222.0*   CBG:  Recent Labs Lab 01/23/14 2058 01/24/14 0749 01/24/14 1142  GLUCAP 297* 243* 235*       Signed:  Taji Barretto  [facility] 01/28/2014, 2:28 PM

## 2014-02-01 ENCOUNTER — Ambulatory Visit (INDEPENDENT_AMBULATORY_CARE_PROVIDER_SITE_OTHER): Payer: Medicare HMO | Admitting: Cardiovascular Disease

## 2014-02-01 ENCOUNTER — Encounter: Payer: Self-pay | Admitting: Cardiovascular Disease

## 2014-02-01 VITALS — BP 120/80 | HR 68 | Ht 72.0 in | Wt 215.0 lb

## 2014-02-01 DIAGNOSIS — N183 Chronic kidney disease, stage 3 unspecified: Secondary | ICD-10-CM

## 2014-02-01 DIAGNOSIS — I5021 Acute systolic (congestive) heart failure: Secondary | ICD-10-CM

## 2014-02-01 DIAGNOSIS — G629 Polyneuropathy, unspecified: Secondary | ICD-10-CM

## 2014-02-01 DIAGNOSIS — G894 Chronic pain syndrome: Secondary | ICD-10-CM

## 2014-02-01 DIAGNOSIS — I159 Secondary hypertension, unspecified: Secondary | ICD-10-CM

## 2014-02-01 DIAGNOSIS — E785 Hyperlipidemia, unspecified: Secondary | ICD-10-CM

## 2014-02-01 DIAGNOSIS — Z720 Tobacco use: Secondary | ICD-10-CM

## 2014-02-01 DIAGNOSIS — I420 Dilated cardiomyopathy: Secondary | ICD-10-CM

## 2014-02-01 DIAGNOSIS — E1142 Type 2 diabetes mellitus with diabetic polyneuropathy: Secondary | ICD-10-CM

## 2014-02-01 DIAGNOSIS — I5022 Chronic systolic (congestive) heart failure: Secondary | ICD-10-CM

## 2014-02-01 NOTE — Progress Notes (Signed)
Patient ID: [name], male    DOB: [birth year], 58 y.o.   MRN: [id number]  HPI Comments: [name] is a 58 year old gentleman with history of chronic pain, long history of smoking, nonischemic cardiomyopathy, ejection fraction 20-25%, chronic renal insufficiency, diabetes, OSA admitted with acute hypoxic respiratory failure requiring mechanical ventilation in early 2015 secondary to narcotic-induced encephalopathy/sedation with potential aspiration, hyperlipidemia, prior cardiac catheterization several years ago showing no significant coronary artery disease, who presents to establish care in the [place] office Prior notes indicating cardiac catheterization in 2008 showing nonobstructive CAD  Recent admission to [facility] 11/28/2011 with discharge 12/06/2013 He was admitted with pneumonia, started on antibiotics Echocardiogram 12/02/2013 showing ejection fraction 25-30%,  He had a stress test 12/04/2013 showing anterior wall ischemia, prior inferior infarct , ejection fraction 34%  No catheterization secondary to underlying renal dysfunction  It was recommended that he be managed medically with outpatient followup  Kidney ultrasound was essentially normal  Final diagnosis was chest pain secondary to pneumonia, acute on chronic renal failure, acute on chronic systolic CHF   Readmitted to the hospital at  [facility] for worsening cough and chest discomfort with shortness of breath. Again continued on antibiotics, started on Levaquin, discharged the next day on 01/24/2014   In followup today, he reports that his cough has resolved, overall he feels well with no complaints. He denies having any significant lower extremity edema.  Creatinine was 1.75 when he was taking Lasix twice a day . Now he is taking Lasix 40 mg daily   EKG shows normal sinus rhythm with rate 68 beats per minute, intraventricular conduction delay, borderline left branch block   Outpatient Encounter Prescriptions as of  02/01/2014  Medication Sig  . aspirin 81 MG tablet Take 81 mg by mouth daily.  Marland Kitchen atorvastatin (LIPITOR) 20 MG tablet Take 20 mg by mouth daily.  . carvedilol (COREG) 6.25 MG tablet Take 3.125 mg by mouth 2 (two) times daily with a meal.  . clonazePAM (KLONOPIN) 0.5 MG tablet Take 1 tablet (0.5 mg total) by mouth 2 (two) times daily as needed for anxiety.  Marland Kitchen FLUoxetine (PROZAC) 20 MG capsule Take 80 mg by mouth daily.  . furosemide (LASIX) 40 MG tablet Take 40 mg by mouth daily.  Marland Kitchen gabapentin (NEURONTIN) 100 MG capsule Take 300 mg by mouth 3 (three) times daily.  . insulin aspart protamine-insulin aspart (NOVOLOG 70/30) (70-30) 100 UNIT/ML injection Inject 10-20 Units into the skin 2 (two) times daily with a meal. Units vary based on food consumed.  Marland Kitchen lisinopril (PRINIVIL,ZESTRIL) 10 MG tablet Take 10 mg by mouth daily.  . Melatonin 5 MG TBDP Take 5 mg by mouth at bedtime.  . metolazone (ZAROXOLYN) 2.5 MG tablet Take 1 tablet (2.5 mg total) by mouth daily as needed (for increased swelling. After taking a dose, call you cardiologist for further instructions.).  Marland Kitchen morphine (MSIR) 15 MG tablet Take 1 tablet (15 mg total) by mouth every 12 (twelve) hours as needed for severe pain.  . Multiple Vitamins-Minerals (CENTRUM SILVER ULTRA MENS) TABS Take 1 tablet by mouth daily.    . nitroGLYCERIN (NITROSTAT) 0.4 MG SL tablet Place 1 tablet (0.4 mg total) under the tongue every 5 (five) minutes as needed for chest pain.  Marland Kitchen ondansetron (ZOFRAN-ODT) 4 MG disintegrating tablet Take 1 tablet (4 mg total) by mouth every 4 (four) hours as needed for nausea.  . pantoprazole (PROTONIX) 40 MG tablet Take 40 mg by mouth daily.  . polyethylene  glycol (MIRALAX / GLYCOLAX) packet Take 17 g by mouth daily as needed. For constipation  . Potassium Chloride ER 20 MEQ TBCR Take 20 mEq by mouth daily.  Marland Kitchen rOPINIRole (REQUIP) 3 MG tablet Take 6 mg by mouth at bedtime.  . senna (SENOKOT) 8.6 MG TABS tablet Take 2 tablets by mouth  2 (two) times daily.  . traZODone (DESYREL) 100 MG tablet Take 100 mg by mouth at bedtime.  . hydrALAZINE (APRESOLINE) 25 MG tablet  he is not taking this   . isosorbide mononitrate (IMDUR) 30 MG 24 hr tablet  he is not taking this     Review of Systems  Constitutional: Negative.   HENT: Negative.   Eyes: Negative.   Respiratory: Negative.   Cardiovascular: Negative.   Gastrointestinal: Negative.   Endocrine: Negative.   Musculoskeletal: Positive for arthralgias and neck pain.  Skin: Negative.   Allergic/Immunologic: Negative.   Neurological: Negative.   Hematological: Negative.   Psychiatric/Behavioral: Negative.   All other systems reviewed and are negative.   BP 120/80  Pulse 68  Ht 6' (1.829 m)  Wt 215 lb (97.523 kg)  BMI 29.15 kg/m2  Physical Exam  Nursing note and vitals reviewed. Constitutional: He is oriented to person, place, and time. He appears well-developed and well-nourished.  HENT:  Head: Normocephalic.  Nose: Nose normal.  Mouth/Throat: Oropharynx is clear and moist.  Eyes: Conjunctivae are normal. Pupils are equal, round, and reactive to light.  Neck: Normal range of motion. Neck supple. No JVD present.  Cardiovascular: Normal rate, regular rhythm, S1 normal, S2 normal, normal heart sounds and intact distal pulses.  Exam reveals no gallop and no friction rub.   No murmur heard. Pulmonary/Chest: Effort normal. No respiratory distress. He has decreased breath sounds. He has no wheezes. He has no rales. He exhibits no tenderness.  Abdominal: Soft. Bowel sounds are normal. He exhibits no distension. There is no tenderness.  Musculoskeletal: Normal range of motion. He exhibits no edema and no tenderness.  Lymphadenopathy:    He has no cervical adenopathy.  Neurological: He is alert and oriented to person, place, and time. Coordination normal.  Skin: Skin is warm and dry. No rash noted. No erythema.  Psychiatric: He has a normal mood and affect. His behavior  is normal. Judgment and thought content normal.      Assessment and Plan

## 2014-02-01 NOTE — Patient Instructions (Addendum)
Please restart the isosorbide one a day Stay on lasix one a day  Please call us if you have new issues that need to be addressed before your next appt.  Your physician wants you to follow-up in: 6 months.  You will receive a reminder letter in the mail two months in advance. If you don't receive a letter, please call our office to schedule the follow-up appointment.

## 2014-02-01 NOTE — Assessment & Plan Note (Signed)
Recommended that he stay on his current medications including Lasix 40 mg daily Restart isosorbide 30 mg daily He will continue to hold the hydralazine

## 2014-02-02 NOTE — Assessment & Plan Note (Signed)
Chronic pain, managed by Dr. Wess Botts at the [facility] in [place] (phone [phone]) Discussed whether it was okay to take muscle relaxers. Discuss with him that this would likely not hurt his cardiac issues.

## 2014-02-02 NOTE — Assessment & Plan Note (Signed)
Nonischemic cardiopathy, prior catheterization in 2008

## 2014-02-02 NOTE — Assessment & Plan Note (Signed)
We have encouraged him to continue to work on weaning his cigarettes and smoking cessation. He will continue to work on this and does not want any assistance with chantix.  

## 2014-02-02 NOTE — Assessment & Plan Note (Signed)
Notes indicate he is taking Lipitor No recent lipid panel available

## 2014-02-02 NOTE — Assessment & Plan Note (Signed)
We have encouraged continued exercise, careful diet management in an effort to lose weight. 

## 2014-02-02 NOTE — Assessment & Plan Note (Signed)
Chronic renal insufficiency, worse with overdiuresis. Likely secondary to underlying diabetes

## 2014-02-02 NOTE — Assessment & Plan Note (Signed)
He was previously on Lasix 40 mg twice a day. Creatinine at that time 1.75 Currently taking Lasix 40 mg daily

## 2014-02-05 ENCOUNTER — Telehealth: Payer: Self-pay

## 2014-02-05 NOTE — Telephone Encounter (Signed)
Pt wife called and needs to confirm medication changes. Please call.

## 2014-02-05 NOTE — Telephone Encounter (Signed)
Spoke w/ pt's wife.  She states that pt's med list is correct, but all the dosages are wrong.  After discussing and finding that she is questioning the dosage of all of pt's meds, [facility].  Advised her that pt was given a med list to confirm at his ov, but she states that they did not read it, as they assumed we knew what he was taking.  She is to call later this afternoon w/ updated and correct med list.

## 2014-02-06 ENCOUNTER — Telehealth: Payer: Self-pay | Admitting: *Deleted

## 2014-02-06 NOTE — Telephone Encounter (Signed)
[name] at 02/06/2014 2:01 PM     Status: Signed        Please call patient's wife the prescriptions strengths are incorrect.

## 2014-02-06 NOTE — Telephone Encounter (Signed)
Please call patient's wife the  prescriptions strengths are incorrect.

## 2014-02-06 NOTE — Telephone Encounter (Signed)
Left message for pt to call back  °

## 2014-02-06 NOTE — Telephone Encounter (Signed)
Please see previous phone note.  

## 2014-02-07 ENCOUNTER — Telehealth: Payer: Self-pay

## 2014-02-07 ENCOUNTER — Encounter: Payer: Self-pay | Admitting: *Deleted

## 2014-02-07 ENCOUNTER — Encounter: Payer: Self-pay | Admitting: Physician Assistant

## 2014-02-07 ENCOUNTER — Encounter (INDEPENDENT_AMBULATORY_CARE_PROVIDER_SITE_OTHER): Payer: Self-pay

## 2014-02-07 ENCOUNTER — Ambulatory Visit (INDEPENDENT_AMBULATORY_CARE_PROVIDER_SITE_OTHER): Payer: Medicare HMO | Admitting: Physician Assistant

## 2014-02-07 VITALS — BP 113/83 | HR 71 | Ht 72.0 in | Wt 212.5 lb

## 2014-02-07 DIAGNOSIS — E785 Hyperlipidemia, unspecified: Secondary | ICD-10-CM

## 2014-02-07 DIAGNOSIS — I951 Orthostatic hypotension: Secondary | ICD-10-CM

## 2014-02-07 DIAGNOSIS — I429 Cardiomyopathy, unspecified: Secondary | ICD-10-CM

## 2014-02-07 DIAGNOSIS — Z720 Tobacco use: Secondary | ICD-10-CM

## 2014-02-07 DIAGNOSIS — I428 Other cardiomyopathies: Secondary | ICD-10-CM

## 2014-02-07 DIAGNOSIS — I5022 Chronic systolic (congestive) heart failure: Secondary | ICD-10-CM

## 2014-02-07 MED ORDER — CARVEDILOL 3.125 MG PO TABS: 3.1250 mg | ORAL_TABLET | Freq: Two times a day (BID) | ORAL | Status: DC

## 2014-02-07 MED ORDER — BUPROPION HCL ER (XL) 300 MG PO TB24: 300.0000 mg | ORAL_TABLET | Freq: Every day | ORAL | Status: AC

## 2014-02-07 MED ORDER — ATORVASTATIN CALCIUM 20 MG PO TABS: 20.0000 mg | ORAL_TABLET | Freq: Every day | ORAL | Status: AC

## 2014-02-07 MED ORDER — LISINOPRIL 5 MG PO TABS: 2.5000 mg | ORAL_TABLET | Freq: Every day | ORAL | Status: DC

## 2014-02-07 MED ORDER — ATORVASTATIN CALCIUM 20 MG PO TABS: 20.0000 mg | ORAL_TABLET | Freq: Every day | ORAL | Status: DC

## 2014-02-07 MED ORDER — POTASSIUM CHLORIDE CRYS ER 20 MEQ PO TBCR: 20.0000 meq | EXTENDED_RELEASE_TABLET | Freq: Every day | ORAL | Status: AC

## 2014-02-07 MED ORDER — PANTOPRAZOLE SODIUM 40 MG PO TBEC: 40.0000 mg | DELAYED_RELEASE_TABLET | Freq: Every day | ORAL | Status: AC

## 2014-02-07 MED ORDER — PANTOPRAZOLE SODIUM 40 MG PO TBEC: 40.0000 mg | DELAYED_RELEASE_TABLET | Freq: Every day | ORAL | Status: DC

## 2014-02-07 MED ORDER — POTASSIUM CHLORIDE CRYS ER 20 MEQ PO TBCR: 20.0000 meq | EXTENDED_RELEASE_TABLET | Freq: Every day | ORAL | Status: DC

## 2014-02-07 MED ORDER — ISOSORBIDE MONONITRATE ER 30 MG PO TB24: 15.0000 mg | ORAL_TABLET | Freq: Every day | ORAL | Status: DC

## 2014-02-07 NOTE — Telephone Encounter (Signed)
Spoke w pt's wife.  She states that pt's med list is completely incorrect.  She states that she lives 35 minutes away and would like to bring list in, have all new rxs sent to local pharmacy, as well as written rxs to take to [place].  She states that she tried to adhere to med list given at last ov, but that pt has "passed out and fallen twice". Advised her have pt come in for eval and to discuss meds and correct doses.  Pt sched to see Christell Faith, PA this afternoon.

## 2014-02-07 NOTE — Progress Notes (Signed)
Patient Name: [name], [name] Apr 18, 1956, MRN [id number]  Date of Encounter: 02/07/2014  Primary Care Provider:  Glo Herring., MD Primary Cardiologist:  Dr. Rockey Situ, MD  Patient Profile:  58 y.o. male with history below here to discuss medication reconciliation.    Problem List:   Past Medical History  Diagnosis Date  . Type 2 diabetes mellitus   . Essential hypertension, benign   . 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  . CKD (chronic kidney disease) stage 3, GFR 30-59 ml/min   . Arthritis   . Obesity   . Depression   . Sleep apnea   . Diabetic neuropathy   . Coronary atherosclerosis of native coronary artery     Nonobstructive by cardiac catheterization September 2008  . Esophagitis, reflux 07/07/10    MW tear, erosive reflux esophagitis, 2cm hh  . S/P colonoscopy April 2012    Rourk: torturous colon, hyperplastic polyps  . Tremor   . Diverticulum   . Hiatal hernia   . Gastroparesis 11/2010    No emptying at two hours.   . Chronic back pain   . Clonic seizure     Controlled with klonodine  . Acute respiratory failure with hypoxia 06/23/2013    Due to opiate toxicity. Intubated & extubated  . COPD (chronic obstructive pulmonary disease)   . CHF (congestive heart failure)    Past Surgical History  Procedure Laterality Date  . Neck surgery      Discectomy  . Hand surgery      Rght-carpal tunnel  . Colonoscopy  before 2002    [place]  . Appendectomy    . Other surgical history      Bone graft-knee  . Anterior lumbar corpectomy w/ fusion      C5-6/C4-7  . Colonoscopy  08/21/2010    Elongaed tortuous colon otherwise normal  . Esophagogastroduodenoscopy  07/08/10    Dr. Volney American esophageal erosions and excoriations consistent with erosive reflux esophagitis, hiatal hernia  . Knee arthroscopy      Right  . Esophagogastroduodenoscopy (egd) with propofol  02/25/2012    Procedure:  ESOPHAGOGASTRODUODENOSCOPY (EGD) WITH PROPOFOL;  Surgeon: Daneil Dolin, MD;  Location: [facility];  Service: Endoscopy;  Laterality: N/A;  7:30 /POLYPHARMACY     Allergies:  Allergies  Allergen Reactions  . Ms Contin [Morphine Sulfate Er]     Doesn't like to take in liquid form( burns and doesn't help) (pill form works fine)      HPI:  58 y.o. male with the above problem list including chronic pain, lon history of smoking, nonischemic cardiomyopathy EF 25-30%, chronic renal insufficiency, DM2, & OSA who was admitted to the hospital earlier in 2015 with acute hypoxic respiratory failure requiring mechanical ventilation 2/2 narcotic induced encephalopathy/sedation with potential aspiration here to discuss medication reconciliation.   Patient with history of nonischemic cardiomyopathy with an EF 20-25%. Last cardiac catheterization several years ago per notes (2008) showing no significant CAD. Last stress test 12/04/2013 showing anterior wall ischemia, prior inferior infarct, and EF 34%. It was recommended that he be managed medically with outpatient follow up. Last echo 12/02/2013 showed an EF 25-30%.    He was last seen by Dr. Rockey Situ, MD on 02/01/2014 for follow up. At that time it was recommended that he stay on his current systolic CHF medications including Lasix 40 mg daily (previously on Lasix 40 mg bid decreased 2/2 SCr), he would restart isosorbide  30 mg daily , and he would continue to hold the hydralazine. He was encouraged to quit smoking and he did not want any assistance with Chantix. Prior notes indicated he was taking Lipitor (no recent FLP). His chronic pain his managed by Dr. Wess Botts at the [facility] in [place], [place] (phone number # [phone]).    He comes in today requesting medication reconciliation. Since his last office visit his has had a difficult time managing his medications at home. He should be taking Coreg 3.125 mg bid (recent dose decrease at his 02/01/2014  office visit) but has Coreg 12.5 mg tabs. He did not take any Coreg with this dose change until the prior day when he restarted the Coreg. Upon doing so he became lightheaded with standing. There was some question that his BP may have gotten too low. His wife is here today to address this concern. He denies any chest pain, palpitations, SOB/DOE, diaphoresis.      Home Medications:  Prior to Admission medications   Medication Sig Start Date End Date Taking? Authorizing Provider  aspirin 81 MG tablet Take 81 mg by mouth daily.    Historical Provider, MD  atorvastatin (LIPITOR) 20 MG tablet Take 20 mg by mouth daily.    Historical Provider, MD  carvedilol (COREG) 6.25 MG tablet Take 3.125 mg by mouth 2 (two) times daily with a meal. 06/04/13   Samuella Cota, MD  clonazePAM (KLONOPIN) 0.5 MG tablet Take 1 tablet (0.5 mg total) by mouth 2 (two) times daily as needed for anxiety. 01/24/14   Hosie Poisson, MD  FLUoxetine (PROZAC) 20 MG capsule Take 80 mg by mouth daily.    Historical Provider, MD  furosemide (LASIX) 40 MG tablet Take 40 mg by mouth daily. 06/04/13   Samuella Cota, MD  gabapentin (NEURONTIN) 100 MG capsule Take 300 mg by mouth 3 (three) times daily.    Historical Provider, MD  hydrALAZINE (APRESOLINE) 25 MG tablet Take 1 tablet (25 mg total) by mouth every 8 (eight) hours. 06/28/13   Rexene Alberts, MD  insulin aspart protamine-insulin aspart (NOVOLOG 70/30) (70-30) 100 UNIT/ML injection Inject 10-20 Units into the skin 2 (two) times daily with a meal. Units vary based on food consumed.    Historical Provider, MD  isosorbide mononitrate (IMDUR) 30 MG 24 hr tablet Take 1 tablet (30 mg total) by mouth daily. 02/01/14   Minna Merritts, MD  lisinopril (PRINIVIL,ZESTRIL) 10 MG tablet Take 10 mg by mouth daily.    Historical Provider, MD  Melatonin 5 MG TBDP Take 5 mg by mouth at bedtime.    Historical Provider, MD  metolazone (ZAROXOLYN) 2.5 MG tablet Take 1 tablet (2.5 mg total) by mouth  daily as needed (for increased swelling. After taking a dose, call you cardiologist for further instructions.). 06/04/13   Samuella Cota, MD  morphine (MSIR) 15 MG tablet Take 1 tablet (15 mg total) by mouth every 12 (twelve) hours as needed for severe pain. 01/24/14   Hosie Poisson, MD  Multiple Vitamins-Minerals (CENTRUM SILVER ULTRA MENS) TABS Take 1 tablet by mouth daily.      Historical Provider, MD  nitroGLYCERIN (NITROSTAT) 0.4 MG SL tablet Place 1 tablet (0.4 mg total) under the tongue every 5 (five) minutes as needed for chest pain. 01/03/14   Tanda Rockers, MD  ondansetron (ZOFRAN-ODT) 4 MG disintegrating tablet Take 1 tablet (4 mg total) by mouth every 4 (four) hours as needed for nausea. 01/27/12  Mahala Menghini, PA-C  pantoprazole (PROTONIX) 40 MG tablet Take 40 mg by mouth daily.    Historical Provider, MD  polyethylene glycol (MIRALAX / GLYCOLAX) packet Take 17 g by mouth daily as needed. For constipation    Historical Provider, MD  Potassium Chloride ER 20 MEQ TBCR Take 20 mEq by mouth daily. 06/28/13   Rexene Alberts, MD  rOPINIRole (REQUIP) 3 MG tablet Take 6 mg by mouth at bedtime. 06/28/13   Rexene Alberts, MD  senna (SENOKOT) 8.6 MG TABS tablet Take 2 tablets by mouth 2 (two) times daily.    Historical Provider, MD  traZODone (DESYREL) 100 MG tablet Take 100 mg by mouth at bedtime.    Historical Provider, MD     Weights: Filed Weights   02/07/14 1407  Weight: 212 lb 8 oz (96.389 kg)     Review of Systems:  All other systems reviewed and are otherwise negative except as noted above.  Physical Exam:  Blood pressure 113/83, pulse 71, height 6' (1.829 m), weight 212 lb 8 oz (96.389 kg).  General: Pleasant, NAD Psych: Normal affect. Neuro: Alert and oriented X 3. Moves all extremities spontaneously. HEENT: Normal  Neck: Supple without bruits or JVD. Lungs:  Resp regular and unlabored, CTA. Heart: RRR no s3, s4, or murmurs. Abdomen: Soft, non-tender, non-distended, BS + x  4.  Extremities: No clubbing, cyanosis or edema. DP/PT/Radials 2+ and equal bilaterally.   Accessory Clinical Findings:  EKG - NSR, 71, rare PVC, nonspecific st/t changes laterally   Assessment & Plan:  1. Hypotension: -Consistent with orthostatic hypotension in the office today -Patient has been taking too much Coreg the past two days (6.25 mg bid), should be taking 3.125 mg bid as of 02/01/2014, but he only had 12.5 mg tabs at home. He was not taking any Coreg from 10/8-9 through 10/13 but decided to restart the medication even at the incorrect dose. When he restarted this it seemed to have dropped his already low BP even further. He denies any palpitations, SOB, diaphoresis, chest pains, or paresthesias. See below for medication reconciliation details.  -If symptoms persist he is to RTC for further evaluation including echo and cardiac monitoring   2. Chronic systolic CHF: -Coreg 5.638 mg bid, lisinopril 2.5 mg, Imdur 15 mg (has been taking 15 mg at home instead of 30 mg, BP appears to be too soft for 30 mg at this time), Lasix 40 mg daily, metolazone 2.5 prn  3. Nonischemic cardiomyopathy: -Nonischemic cath 2008  -No angina currently   4. CKD stage III: -Likely 2/2 DM -Avoid nephrotoxic drugs  5. HLD: -Continue Lipitor 20 mg daily  -Lifestyle changes -FLP and HFP at recheck (not fasting currently)  6. DM2: -Lifestyle changes are a must -Per PCP  7. Tobacco abuse: -Encouraged cessation   8. Chronic pain: -Managed by Dr. Wess Botts at the [facility] in [place], [place] (phone number # [phone]).  -His morphine has been increased to 15 mg q 6 hours prn severe pain by pain management   9. Polypharmacy: -Discussed medication reconciliation with the patient and his wife. They will discuss this further with his PCP at the [facility] and with his neurologist.  -See updated medication list   Christell Faith, PA-C [address] [place], [place] [address] [phone] [facility] 02/07/2014, 3:36 PM

## 2014-02-07 NOTE — Patient Instructions (Addendum)
Your next appointment will be scheduled in our new office located at :  [facility]  [address], [place], [place] [address]  Call or return to clinic prn if these symptoms worsen or fail to improve as anticipated.

## 2014-02-07 NOTE — Telephone Encounter (Signed)
[name] at 02/07/2014 9:51 AM     Status: Signed        Pt wife would like to talk about pt medications. States his "entire medication list is incorrect"

## 2014-02-07 NOTE — Telephone Encounter (Signed)
Please see previous phone note.  

## 2014-02-07 NOTE — Telephone Encounter (Signed)
Pt wife would like to talk about pt medications. States his "entire medication list is incorrect"

## 2014-02-13 ENCOUNTER — Telehealth: Payer: Self-pay | Admitting: Cardiovascular Disease

## 2014-02-13 NOTE — Telephone Encounter (Signed)
Pt had some changes in meds and was calling to tell us about them, lisinoprol, back on it. Please call patient, they wanted to speak to [place], but told them nurse would call them.

## 2014-02-13 NOTE — Telephone Encounter (Signed)
Left message for pt to call back  °

## 2014-02-20 NOTE — Telephone Encounter (Signed)
Pt states that he wants to be put on "seriquil" he thinks it will help his bp. Please advise him and call him.

## 2014-02-20 NOTE — Telephone Encounter (Signed)
Spoke w/ pt's wife.  She states that she would like to make an appt w/ Dr. Rockey Situ, as "we want to change his medicines". She would not elaborate further, just that "we are excited about this change". Pt sched to see Dr. Rockey Situ 03/07/14. She inquires about paperwork for the [facility] and if we can get this started for her. Spoke w/ Izora Gala and advised pt that this info is the responsibility of the pt to obtain. She states that she will pick up paperwork and bring to pt's next ov.

## 2014-02-21 NOTE — Telephone Encounter (Signed)
This encounter was created in error - please disregard.

## 2014-03-06 ENCOUNTER — Ambulatory Visit: Payer: Self-pay | Admitting: *Deleted

## 2014-03-06 LAB — BASIC METABOLIC PANEL
Anion Gap: 5 — ABNORMAL LOW (ref 7–16)
BUN: 32 mg/dL — AB (ref 7–18)
CHLORIDE: 108 mmol/L — AB (ref 98–107)
CO2: 28 mmol/L (ref 21–32)
Calcium, Total: 8.6 mg/dL (ref 8.5–10.1)
Creatinine: 2.26 mg/dL — ABNORMAL HIGH (ref 0.60–1.30)
EGFR (African American): 38 — ABNORMAL LOW
EGFR (Non-African Amer.): 32 — ABNORMAL LOW
GLUCOSE: 203 mg/dL — AB (ref 65–99)
Osmolality: 294 (ref 275–301)
Potassium: 5 mmol/L (ref 3.5–5.1)
Sodium: 141 mmol/L (ref 136–145)

## 2014-03-06 LAB — PROTEIN / CREATININE RATIO, URINE
Creatinine, Urine: 53.9 mg/dL (ref 30.0–125.0)
PROTEIN/CREAT. RATIO: 705 mg/g{creat} — AB (ref 0–200)
Protein, Random Urine: 38 mg/dL — ABNORMAL HIGH (ref 0–12)

## 2014-03-07 ENCOUNTER — Ambulatory Visit: Payer: Non-veteran care | Admitting: Cardiovascular Disease

## 2014-03-09 DIAGNOSIS — I428 Other cardiomyopathies: Secondary | ICD-10-CM | POA: Insufficient documentation

## 2014-03-09 DIAGNOSIS — E669 Obesity, unspecified: Secondary | ICD-10-CM | POA: Insufficient documentation

## 2014-03-09 DIAGNOSIS — I2510 Atherosclerotic heart disease of native coronary artery without angina pectoris: Secondary | ICD-10-CM | POA: Insufficient documentation

## 2014-03-21 ENCOUNTER — Ambulatory Visit: Payer: Non-veteran care | Admitting: Cardiovascular Disease

## 2014-03-26 ENCOUNTER — Telehealth: Payer: Self-pay | Admitting: Cardiovascular Disease

## 2014-03-26 ENCOUNTER — Ambulatory Visit: Payer: Self-pay | Admitting: Family

## 2014-03-26 NOTE — Telephone Encounter (Signed)
nurse practitioner called wanted to talk to doctor about mutual patient. Please call back.

## 2014-03-27 ENCOUNTER — Encounter: Payer: Self-pay | Admitting: Cardiovascular Disease

## 2014-03-27 ENCOUNTER — Ambulatory Visit (INDEPENDENT_AMBULATORY_CARE_PROVIDER_SITE_OTHER): Payer: Medicare HMO | Admitting: Cardiovascular Disease

## 2014-03-27 VITALS — BP 80/48 | HR 72 | Ht 72.0 in | Wt 211.5 lb

## 2014-03-27 DIAGNOSIS — I2510 Atherosclerotic heart disease of native coronary artery without angina pectoris: Secondary | ICD-10-CM

## 2014-03-27 DIAGNOSIS — R55 Syncope and collapse: Secondary | ICD-10-CM

## 2014-03-27 DIAGNOSIS — J690 Pneumonitis due to inhalation of food and vomit: Secondary | ICD-10-CM

## 2014-03-27 DIAGNOSIS — R0602 Shortness of breath: Secondary | ICD-10-CM

## 2014-03-27 DIAGNOSIS — R079 Chest pain, unspecified: Secondary | ICD-10-CM

## 2014-03-27 DIAGNOSIS — I428 Other cardiomyopathies: Secondary | ICD-10-CM

## 2014-03-27 DIAGNOSIS — I429 Cardiomyopathy, unspecified: Secondary | ICD-10-CM

## 2014-03-27 DIAGNOSIS — R410 Disorientation, unspecified: Secondary | ICD-10-CM

## 2014-03-27 DIAGNOSIS — I951 Orthostatic hypotension: Secondary | ICD-10-CM | POA: Insufficient documentation

## 2014-03-27 MED ORDER — FUROSEMIDE 40 MG PO TABS: 40.0000 mg | ORAL_TABLET | Freq: Every day | ORAL | Status: DC | PRN

## 2014-03-27 NOTE — Assessment & Plan Note (Signed)
Currently with no symptoms of angina. No further workup at this time. Continue current medication regimen. 

## 2014-03-27 NOTE — Patient Instructions (Signed)
Blood pressure is still dropping when you stand up Hold the lasix/furosemide for now  Restart 1/2 to a whole lasix/furosemide every other day only when you develop leg swelling Ok to restart 1/2 lasix daily if blood pressure >120 when standing  Please call us if you have new issues that need to be addressed before your next appt.  Your physician wants you to follow-up in: 1 month.

## 2014-03-27 NOTE — Assessment & Plan Note (Signed)
Significantly orthostatic on today's visit. We'll continue to hold all of his blood pressure medications. We'll hold his Lasix until blood pressure improves We'll restart Lasix only for leg swelling or stable blood pressure on standing

## 2014-03-27 NOTE — Assessment & Plan Note (Signed)
He has had history of delirium and encephalopathy in the past. Recent delirium likely secondary to resolving infection. Unable to exclude hypotension as a contributor to his symptoms. Medication changes as above

## 2014-03-27 NOTE — Assessment & Plan Note (Signed)
He appears relatively dry on today's visit, hypotensive. Unable to exclude complications from recent pneumonia. Most of his medications have been held. Today we'll hold the Lasix as weight is down, still hypotensive. We'll restart Lasix for lower extreme edema or once his blood pressure stabilizes with standing

## 2014-03-27 NOTE — Progress Notes (Signed)
Patient ID: [name], male    DOB: 08/03/1955, 58 y.o.   MRN: [id number]  HPI Comments: [name] is a 58 year old gentleman with history of chronic pain, long history of smoking, nonischemic cardiomyopathy, ejection fraction 20-25%, chronic renal insufficiency, diabetes, OSA admitted with acute hypoxic respiratory failure requiring mechanical ventilation in early 2015 secondary to narcotic-induced encephalopathy/sedation with potential aspiration, hyperlipidemia, prior cardiac catheterization several years ago showing no significant coronary artery disease Prior notes indicating cardiac catheterization in 2008 showing nonobstructive CAD He presents today for follow-up after recent hospitalization, orthostatic hypotension  He was recent seen in the heart failure clinic. I discussed his case with Darylene Price. He was tremendously orthostatic, was having near syncope/syncope. For severe hypotension, his isosorbide was held and Coreg was held. He is no longer taking lisinopril. He is continue to take Lasix daily Orthostatics in the office today show blood pressure 112/70 supine, 108/62 sitting, 84/58 standing with symptoms of lightheadedness. He was recently in the hospital with discharge November 20, diagnosed with pneumonia He reports previous weight was 212 pounds, now 204 pounds at home. Reports that he is drinking plenty of fluids daily He has finished his antibiotics, sleeping frequently during the daytime, no lower extremity edema He continues to smoke. Wife reports that he has been confused periodically  Notes from the heart failure clinic were reviewed  EKG on today's visit shows normal sinus rhythm with PVCs in a bigeminal pattern, old anterior MI  Past medical history  admission to [facility]/27/2013 with discharge 12/06/2013 He was admitted with pneumonia, started on antibiotics Echocardiogram 12/02/2013 showing ejection fraction 25-30%,  He had a stress test 12/04/2013 showing  anterior wall ischemia, prior inferior infarct , ejection fraction 34%  No catheterization secondary to underlying renal dysfunction  Kidney ultrasound was essentially normal  Final diagnosis was chest pain secondary to pneumonia, acute on chronic renal failure, acute on chronic systolic CHF   Readmitted to the hospital at  [facility] for worsening cough and chest discomfort with shortness of breath. Again continued on antibiotics, started on Levaquin, discharged the next day on 01/24/2014   Outpatient Encounter Prescriptions as of 03/27/2014  Medication Sig  . aspirin 81 MG tablet Take 81 mg by mouth daily.  Marland Kitchen atorvastatin (LIPITOR) 20 MG tablet Take 1 tablet (20 mg total) by mouth daily.  Marland Kitchen buPROPion (WELLBUTRIN XL) 300 MG 24 hr tablet Take 1 tablet (300 mg total) by mouth daily.  . clonazePAM (KLONOPIN) 0.5 MG tablet Take 1 tablet (0.5 mg total) by mouth 2 (two) times daily as needed for anxiety.  Marland Kitchen FLUoxetine (PROZAC) 20 MG capsule Take 80 mg by mouth daily.  . furosemide (LASIX) 40 MG tablet Take 1 tablet (40 mg total) by mouth daily as needed.  . gabapentin (NEURONTIN) 100 MG capsule Take 300 mg by mouth 3 (three) times daily.  . insulin aspart protamine-insulin aspart (NOVOLOG 70/30) (70-30) 100 UNIT/ML injection Inject 10-20 Units into the skin 2 (two) times daily with a meal. Units vary based on food consumed.  . Melatonin 5 MG TBDP Take 5 mg by mouth as needed.   Marland Kitchen morphine (MSIR) 15 MG tablet Take 15 mg by mouth every 6 (six) hours as needed for severe pain.  . Multiple Vitamins-Minerals (CENTRUM SILVER ULTRA MENS) TABS Take 1 tablet by mouth daily.    . nitroGLYCERIN (NITROSTAT) 0.4 MG SL tablet Place 1 tablet (0.4 mg total) under the tongue every 5 (five) minutes as needed for chest pain.  Marland Kitchen  ondansetron (ZOFRAN-ODT) 4 MG disintegrating tablet Take 1 tablet (4 mg total) by mouth every 4 (four) hours as needed for nausea.  . pantoprazole (PROTONIX) 40 MG tablet Take 1 tablet (40 mg  total) by mouth daily.  . polyethylene glycol (MIRALAX / GLYCOLAX) packet Take 17 g by mouth daily as needed. For constipation  . potassium chloride SA (K-DUR,KLOR-CON) 20 MEQ tablet Take 1 tablet (20 mEq total) by mouth daily.  Marland Kitchen rOPINIRole (REQUIP) 3 MG tablet Take 6 mg by mouth at bedtime.  . senna (SENOKOT) 8.6 MG TABS tablet Take 2 tablets by mouth 2 (two) times daily.  . traZODone (DESYREL) 100 MG tablet Take 100 mg by mouth as needed.   . [DISCONTINUED] furosemide (LASIX) 40 MG tablet Take 40 mg by mouth daily.  . [DISCONTINUED] metolazone (ZAROXOLYN) 2.5 MG tablet Take 1 tablet (2.5 mg total) by mouth daily as needed (for increased swelling. After taking a dose, call you cardiologist for further instructions.).  . [DISCONTINUED] carvedilol (COREG) 3.125 MG tablet Take 1 tablet (3.125 mg total) by mouth 2 (two) times daily. (Patient not taking: Reported on 03/27/2014)  . [DISCONTINUED] isosorbide mononitrate (IMDUR) 30 MG 24 hr tablet Take 0.5 tablets (15 mg total) by mouth daily. (Patient not taking: Reported on 03/27/2014)  . [DISCONTINUED] lisinopril (PRINIVIL,ZESTRIL) 5 MG tablet Take 0.5 tablets (2.5 mg total) by mouth daily. (Patient not taking: Reported on 03/27/2014)   Social history  reports that he has been smoking Cigarettes.  He has a 11.25 pack-year smoking history. He has never used smokeless tobacco. He reports that he uses illicit drugs (Marijuana). He reports that he does not drink alcohol.   Past medical history  has a past medical history of Type 2 diabetes mellitus; Essential hypertension, benign; Nonischemic cardiomyopathy; CKD (chronic kidney disease) stage 3, GFR 30-59 ml/min; Arthritis; Obesity; Depression; Sleep apnea; Diabetic neuropathy; Coronary atherosclerosis of native coronary artery; Esophagitis, reflux (07/07/10); S/P colonoscopy (April 2012); Tremor; Diverticulum; Hiatal hernia; Gastroparesis (11/2010); Chronic back pain; Clonic seizure; Acute respiratory failure  with hypoxia (06/23/2013); COPD (chronic obstructive pulmonary disease); and CHF (congestive heart failure).  Review of Systems  Constitutional: Negative.   HENT: Negative.   Respiratory: Negative.   Cardiovascular: Negative.   Endocrine: Negative.   Musculoskeletal: Positive for arthralgias and neck pain.  Skin: Negative.   Neurological: Positive for dizziness and syncope.  Hematological: Negative.   Psychiatric/Behavioral: Negative.   All other systems reviewed and are negative.   BP 80/48 mmHg  Pulse 72  Ht 6' (1.829 m)  Wt 211 lb 8 oz (95.936 kg)  BMI 28.68 kg/m2  Physical Exam  Constitutional: He is oriented to person, place, and time. He appears well-developed and well-nourished.  HENT:  Head: Normocephalic.  Nose: Nose normal.  Mouth/Throat: Oropharynx is clear and moist.  Eyes: Conjunctivae are normal. Pupils are equal, round, and reactive to light.  Neck: Normal range of motion. Neck supple. No JVD present.  Cardiovascular: Normal rate, regular rhythm, S1 normal, S2 normal, normal heart sounds and intact distal pulses.  Exam reveals no gallop and no friction rub.   No murmur heard. Pulmonary/Chest: Effort normal and breath sounds normal. No respiratory distress. He has no wheezes. He has no rales. He exhibits no tenderness.  Abdominal: Soft. Bowel sounds are normal. He exhibits no distension. There is no tenderness.  Musculoskeletal: Normal range of motion. He exhibits no edema or tenderness.  Lymphadenopathy:    He has no cervical adenopathy.  Neurological: He is alert and  oriented to person, place, and time. Coordination normal.  Skin: Skin is warm and dry. No rash noted. No erythema.  Psychiatric: He has a normal mood and affect. His behavior is normal. Judgment and thought content normal.      Assessment and Plan   Nursing note and vitals reviewed.

## 2014-03-27 NOTE — Assessment & Plan Note (Signed)
Recent hospitalization for pneumonia. In fact has had several hospitalizations this year for pneumonia. Likely resolving infection responsible for orthostatic hypotension

## 2014-04-03 ENCOUNTER — Inpatient Hospital Stay (HOSPITAL_COMMUNITY)
Admission: EM | Admit: 2014-04-03 | Discharge: 2014-04-07 | DRG: 193 | Disposition: A | Discharge status: Home Health | Payer: Medicare HMO | Attending: Internal Medicine | Admitting: Internal Medicine

## 2014-04-03 ENCOUNTER — Encounter (HOSPITAL_COMMUNITY): Payer: Self-pay | Admitting: *Deleted

## 2014-04-03 ENCOUNTER — Emergency Department (HOSPITAL_COMMUNITY): Payer: Medicare HMO

## 2014-04-03 DIAGNOSIS — I5022 Chronic systolic (congestive) heart failure: Secondary | ICD-10-CM

## 2014-04-03 DIAGNOSIS — Z7982 Long term (current) use of aspirin: Secondary | ICD-10-CM | POA: Diagnosis not present

## 2014-04-03 DIAGNOSIS — J9601 Acute respiratory failure with hypoxia: Secondary | ICD-10-CM

## 2014-04-03 DIAGNOSIS — F418 Other specified anxiety disorders: Secondary | ICD-10-CM | POA: Diagnosis present

## 2014-04-03 DIAGNOSIS — R209 Unspecified disturbances of skin sensation: Secondary | ICD-10-CM

## 2014-04-03 DIAGNOSIS — J189 Pneumonia, unspecified organism: Secondary | ICD-10-CM | POA: Diagnosis present

## 2014-04-03 DIAGNOSIS — N179 Acute kidney failure, unspecified: Secondary | ICD-10-CM | POA: Diagnosis present

## 2014-04-03 DIAGNOSIS — Z825 Family history of asthma and other chronic lower respiratory diseases: Secondary | ICD-10-CM | POA: Diagnosis not present

## 2014-04-03 DIAGNOSIS — G894 Chronic pain syndrome: Secondary | ICD-10-CM | POA: Diagnosis present

## 2014-04-03 DIAGNOSIS — R634 Abnormal weight loss: Secondary | ICD-10-CM

## 2014-04-03 DIAGNOSIS — I429 Cardiomyopathy, unspecified: Secondary | ICD-10-CM | POA: Diagnosis present

## 2014-04-03 DIAGNOSIS — K3184 Gastroparesis: Secondary | ICD-10-CM

## 2014-04-03 DIAGNOSIS — E669 Obesity, unspecified: Secondary | ICD-10-CM

## 2014-04-03 DIAGNOSIS — E079 Disorder of thyroid, unspecified: Secondary | ICD-10-CM

## 2014-04-03 DIAGNOSIS — F528 Other sexual dysfunction not due to a substance or known physiological condition: Secondary | ICD-10-CM

## 2014-04-03 DIAGNOSIS — Y95 Nosocomial condition: Secondary | ICD-10-CM | POA: Diagnosis present

## 2014-04-03 DIAGNOSIS — G629 Polyneuropathy, unspecified: Secondary | ICD-10-CM

## 2014-04-03 DIAGNOSIS — R0602 Shortness of breath: Secondary | ICD-10-CM

## 2014-04-03 DIAGNOSIS — N189 Chronic kidney disease, unspecified: Secondary | ICD-10-CM

## 2014-04-03 DIAGNOSIS — E785 Hyperlipidemia, unspecified: Secondary | ICD-10-CM | POA: Diagnosis present

## 2014-04-03 DIAGNOSIS — T1490XA Injury, unspecified, initial encounter: Secondary | ICD-10-CM

## 2014-04-03 DIAGNOSIS — D649 Anemia, unspecified: Secondary | ICD-10-CM | POA: Diagnosis present

## 2014-04-03 DIAGNOSIS — I950 Idiopathic hypotension: Secondary | ICD-10-CM

## 2014-04-03 DIAGNOSIS — D72829 Elevated white blood cell count, unspecified: Secondary | ICD-10-CM

## 2014-04-03 DIAGNOSIS — I5042 Chronic combined systolic (congestive) and diastolic (congestive) heart failure: Secondary | ICD-10-CM | POA: Diagnosis present

## 2014-04-03 DIAGNOSIS — M720 Palmar fascial fibromatosis [Dupuytren]: Secondary | ICD-10-CM

## 2014-04-03 DIAGNOSIS — E860 Dehydration: Secondary | ICD-10-CM | POA: Diagnosis present

## 2014-04-03 DIAGNOSIS — R0902 Hypoxemia: Secondary | ICD-10-CM

## 2014-04-03 DIAGNOSIS — F17210 Nicotine dependence, cigarettes, uncomplicated: Secondary | ICD-10-CM | POA: Diagnosis present

## 2014-04-03 DIAGNOSIS — G40409 Other generalized epilepsy and epileptic syndromes, not intractable, without status epilepticus: Secondary | ICD-10-CM

## 2014-04-03 DIAGNOSIS — G9340 Encephalopathy, unspecified: Secondary | ICD-10-CM

## 2014-04-03 DIAGNOSIS — G253 Myoclonus: Secondary | ICD-10-CM

## 2014-04-03 DIAGNOSIS — L739 Follicular disorder, unspecified: Secondary | ICD-10-CM

## 2014-04-03 DIAGNOSIS — G4700 Insomnia, unspecified: Secondary | ICD-10-CM

## 2014-04-03 DIAGNOSIS — I5043 Acute on chronic combined systolic (congestive) and diastolic (congestive) heart failure: Secondary | ICD-10-CM

## 2014-04-03 DIAGNOSIS — K5909 Other constipation: Secondary | ICD-10-CM

## 2014-04-03 DIAGNOSIS — T426X5A Adverse effect of other antiepileptic and sedative-hypnotic drugs, initial encounter: Secondary | ICD-10-CM | POA: Diagnosis present

## 2014-04-03 DIAGNOSIS — R413 Other amnesia: Secondary | ICD-10-CM

## 2014-04-03 DIAGNOSIS — N183 Chronic kidney disease, stage 3 unspecified: Secondary | ICD-10-CM

## 2014-04-03 DIAGNOSIS — T404X5A Adverse effect of other synthetic narcotics, initial encounter: Secondary | ICD-10-CM | POA: Diagnosis present

## 2014-04-03 DIAGNOSIS — R531 Weakness: Secondary | ICD-10-CM | POA: Diagnosis present

## 2014-04-03 DIAGNOSIS — R001 Bradycardia, unspecified: Secondary | ICD-10-CM | POA: Diagnosis present

## 2014-04-03 DIAGNOSIS — Z79899 Other long term (current) drug therapy: Secondary | ICD-10-CM

## 2014-04-03 DIAGNOSIS — G92 Toxic encephalopathy: Secondary | ICD-10-CM | POA: Diagnosis present

## 2014-04-03 DIAGNOSIS — T402X5A Adverse effect of other opioids, initial encounter: Secondary | ICD-10-CM | POA: Diagnosis present

## 2014-04-03 DIAGNOSIS — I951 Orthostatic hypotension: Secondary | ICD-10-CM

## 2014-04-03 DIAGNOSIS — T424X5A Adverse effect of benzodiazepines, initial encounter: Secondary | ICD-10-CM | POA: Diagnosis present

## 2014-04-03 DIAGNOSIS — G4733 Obstructive sleep apnea (adult) (pediatric): Secondary | ICD-10-CM | POA: Diagnosis present

## 2014-04-03 DIAGNOSIS — J449 Chronic obstructive pulmonary disease, unspecified: Secondary | ICD-10-CM | POA: Diagnosis present

## 2014-04-03 DIAGNOSIS — I2510 Atherosclerotic heart disease of native coronary artery without angina pectoris: Secondary | ICD-10-CM | POA: Diagnosis present

## 2014-04-03 DIAGNOSIS — Z9181 History of falling: Secondary | ICD-10-CM | POA: Diagnosis not present

## 2014-04-03 DIAGNOSIS — Z794 Long term (current) use of insulin: Secondary | ICD-10-CM | POA: Diagnosis not present

## 2014-04-03 DIAGNOSIS — E1142 Type 2 diabetes mellitus with diabetic polyneuropathy: Secondary | ICD-10-CM

## 2014-04-03 DIAGNOSIS — Z823 Family history of stroke: Secondary | ICD-10-CM

## 2014-04-03 DIAGNOSIS — R410 Disorientation, unspecified: Secondary | ICD-10-CM

## 2014-04-03 DIAGNOSIS — K210 Gastro-esophageal reflux disease with esophagitis: Secondary | ICD-10-CM | POA: Diagnosis present

## 2014-04-03 DIAGNOSIS — G2581 Restless legs syndrome: Secondary | ICD-10-CM

## 2014-04-03 DIAGNOSIS — I252 Old myocardial infarction: Secondary | ICD-10-CM

## 2014-04-03 DIAGNOSIS — I428 Other cardiomyopathies: Secondary | ICD-10-CM

## 2014-04-03 DIAGNOSIS — I129 Hypertensive chronic kidney disease with stage 1 through stage 4 chronic kidney disease, or unspecified chronic kidney disease: Secondary | ICD-10-CM | POA: Diagnosis present

## 2014-04-03 DIAGNOSIS — M542 Cervicalgia: Secondary | ICD-10-CM

## 2014-04-03 DIAGNOSIS — I959 Hypotension, unspecified: Secondary | ICD-10-CM | POA: Diagnosis present

## 2014-04-03 DIAGNOSIS — E0789 Other specified disorders of thyroid: Secondary | ICD-10-CM

## 2014-04-03 DIAGNOSIS — Z720 Tobacco use: Secondary | ICD-10-CM

## 2014-04-03 DIAGNOSIS — R4182 Altered mental status, unspecified: Secondary | ICD-10-CM

## 2014-04-03 DIAGNOSIS — R251 Tremor, unspecified: Secondary | ICD-10-CM

## 2014-04-03 DIAGNOSIS — Z9989 Dependence on other enabling machines and devices: Secondary | ICD-10-CM

## 2014-04-03 DIAGNOSIS — I5021 Acute systolic (congestive) heart failure: Secondary | ICD-10-CM

## 2014-04-03 DIAGNOSIS — I420 Dilated cardiomyopathy: Secondary | ICD-10-CM

## 2014-04-03 DIAGNOSIS — R892 Abnormal level of other drugs, medicaments and biological substances in specimens from other organs, systems and tissues: Secondary | ICD-10-CM

## 2014-04-03 HISTORY — DX: Myoclonus: G25.3

## 2014-04-03 HISTORY — DX: Other long term (current) drug therapy: Z79.899

## 2014-04-03 HISTORY — DX: Pneumonia, unspecified organism: J18.9

## 2014-04-03 LAB — URINALYSIS, ROUTINE W REFLEX MICROSCOPIC
Bilirubin Urine: NEGATIVE
Glucose, UA: NEGATIVE mg/dL
Hgb urine dipstick: NEGATIVE
Ketones, ur: NEGATIVE mg/dL
Leukocytes, UA: NEGATIVE
NITRITE: NEGATIVE
PH: 6 (ref 5.0–8.0)
Protein, ur: NEGATIVE mg/dL
SPECIFIC GRAVITY, URINE: 1.01 (ref 1.005–1.030)
UROBILINOGEN UA: 0.2 mg/dL (ref 0.0–1.0)

## 2014-04-03 LAB — COMPREHENSIVE METABOLIC PANEL
ALBUMIN: 3.1 g/dL — AB (ref 3.5–5.2)
ALT: 8 U/L (ref 0–53)
ANION GAP: 11 (ref 5–15)
AST: 16 U/L (ref 0–37)
Alkaline Phosphatase: 74 U/L (ref 39–117)
BUN: 39 mg/dL — ABNORMAL HIGH (ref 6–23)
CALCIUM: 8.9 mg/dL (ref 8.4–10.5)
CO2: 26 mEq/L (ref 19–32)
CREATININE: 3.11 mg/dL — AB (ref 0.50–1.35)
Chloride: 105 mEq/L (ref 96–112)
GFR calc Af Amer: 24 mL/min — ABNORMAL LOW (ref >90)
GFR calc non Af Amer: 20 mL/min — ABNORMAL LOW (ref >90)
Glucose, Bld: 95 mg/dL (ref 70–99)
Potassium: 4.6 mEq/L (ref 3.7–5.3)
Sodium: 142 mEq/L (ref 137–147)
TOTAL PROTEIN: 6.6 g/dL (ref 6.0–8.3)
Total Bilirubin: 0.2 mg/dL — ABNORMAL LOW (ref 0.3–1.2)

## 2014-04-03 LAB — CBC WITH DIFFERENTIAL/PLATELET
BASOS PCT: 1 % (ref 0–1)
Basophils Absolute: 0.1 10*3/uL (ref 0.0–0.1)
EOS ABS: 0.3 10*3/uL (ref 0.0–0.7)
EOS PCT: 4 % (ref 0–5)
HEMATOCRIT: 34.3 % — AB (ref 39.0–52.0)
HEMOGLOBIN: 11.1 g/dL — AB (ref 13.0–17.0)
Lymphocytes Relative: 27 % (ref 12–46)
Lymphs Abs: 1.8 10*3/uL (ref 0.7–4.0)
MCH: 28.3 pg (ref 26.0–34.0)
MCHC: 32.4 g/dL (ref 30.0–36.0)
MCV: 87.5 fL (ref 78.0–100.0)
MONO ABS: 0.6 10*3/uL (ref 0.1–1.0)
MONOS PCT: 9 % (ref 3–12)
NEUTROS PCT: 59 % (ref 43–77)
Neutro Abs: 4 10*3/uL (ref 1.7–7.7)
Platelets: 224 10*3/uL (ref 150–400)
RBC: 3.92 MIL/uL — ABNORMAL LOW (ref 4.22–5.81)
RDW: 15.5 % (ref 11.5–15.5)
WBC: 6.7 10*3/uL (ref 4.0–10.5)

## 2014-04-03 LAB — CBG MONITORING, ED: Glucose-Capillary: 92 mg/dL (ref 70–99)

## 2014-04-03 LAB — AMMONIA: AMMONIA: 24 umol/L (ref 11–60)

## 2014-04-03 LAB — MAGNESIUM: Magnesium: 2.1 mg/dL (ref 1.5–2.5)

## 2014-04-03 MED ORDER — DEXTROSE 5 % IV SOLN
1.0000 g | Freq: Once | INTRAVENOUS | Status: AC
  Administered 2014-04-03: 1 g via INTRAVENOUS
  Filled 2014-04-03: qty 10

## 2014-04-03 MED ORDER — AZITHROMYCIN 250 MG PO TABS
500.0000 mg | ORAL_TABLET | Freq: Once | ORAL | Status: AC
  Administered 2014-04-03: 500 mg via ORAL
  Filled 2014-04-03: qty 2

## 2014-04-03 MED ORDER — ONDANSETRON HCL 4 MG/2ML IJ SOLN
4.0000 mg | Freq: Three times a day (TID) | INTRAMUSCULAR | Status: DC | PRN
  Filled 2014-04-03: qty 2

## 2014-04-03 NOTE — ED Notes (Signed)
Patient is seen at the [place], but having trouble keeping appt's. Wife is concerned that he is falling and having recurrent episodes of PNA, as well as difficulty keeping him "safe from himself" when he falls at home. Wife and patient state that they are both at a "loss" for how to proceed with his treatment because "no one" can diagnose him or help them understand how to treat his problems. Wife wants him admitted for observation so he can undergo tests and determine cause of his problems since his doctors are not helping him. She understands this may not "happen", but she "hasn't slept in a week" and needs some "relief"

## 2014-04-03 NOTE — ED Notes (Signed)
Wife will not let pt speak so according to pts wife, pt has had multiple falls like 15-20 times a week. Wife states she can't take care of of him or herself. Pt states he is just here for back xrays. Pt states he has chronic back pain. According to pts cognitive status, wife is scared to give pain meds. WIFE STATES WHAT SHE NEEDS, THE DOCTOR TO DUE IS ADMIT HER HUSBAND FOR OBSERVATION!

## 2014-04-03 NOTE — ED Provider Notes (Signed)
CSN: [id number]     Arrival date & time 04/03/14  1856 History  This chart was scribed for Johnna Acosta, MD by Tula Nakayama, ED Scribe. This patient was seen in room [facility]03/[facility]03 and the patient's care was started at 7:53 PM.    No chief complaint on file.  The history is provided by the patient. No language interpreter was used.    HPI Comments: RAMIEL FORTI is a 58 y.o. male who presents to the Emergency Department complaining of multiple falls and gradually worsening, decreased cognition that started 3 weeks ago. His wife notes that he has fallen 15-20 times in the last 3 weeks. Pt states repeated falls that occur 1-5 times each day because of lack of balance. He states difficulty talking, multiple bruises and a few loose teeth as associated symptoms. Pt saw cardiologist for orthostatic hypotension and syncope last week. He was admitted to the hospital where they held blood pressure medication until resolved, including Lasix. His wife notes improvement of symptoms since seeing the cardiologist.   Pt also complains that cognition has decreased without clonazepam use. He stopped refilling prescription because of financial reasons. His wife states "he loses cognition out of nowhere" which she describes as losing control of his mouth. She notes symptoms occur mostly at night, but sometimes in the middle of the day. Pt states he gets up frequently at night to use the bathroom and loses his way around the house. Pt has been wearing a Fentanyl patch for 1 year for chronic back pain, arthritis and neck pain. He has also lost over 300 lbs over the last 2 years because of loss of appetite. Pt's wife states that they are going to live with his daughter and then enter an assisted living facility. Pt denies changes in urine as associated symptoms.   Past Medical History  Diagnosis Date  . Type 2 diabetes mellitus   . Essential hypertension, benign   . 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  . CKD (chronic kidney disease) stage 3, GFR 30-59 ml/min   . Arthritis   . Obesity   . Depression   . Sleep apnea   . Diabetic neuropathy   . Coronary atherosclerosis of native coronary artery     Nonobstructive by cardiac catheterization September 2008  . Esophagitis, reflux 07/07/10    MW tear, erosive reflux esophagitis, 2cm hh  . S/P colonoscopy April 2012    Rourk: torturous colon, hyperplastic polyps  . Tremor   . Diverticulum   . Hiatal hernia   . Gastroparesis 11/2010    No emptying at two hours.   . Chronic back pain   . Clonic seizure     Controlled with klonodine  . Acute respiratory failure with hypoxia 06/23/2013    Due to opiate toxicity. Intubated & extubated  . COPD (chronic obstructive pulmonary disease)   . CHF (congestive heart failure)    Past Surgical History  Procedure Laterality Date  . Neck surgery      Discectomy  . Hand surgery      Rght-carpal tunnel  . Colonoscopy  before 2002    [place]  . Appendectomy    . Other surgical history      Bone graft-knee  . Anterior lumbar corpectomy w/ fusion      C5-6/C4-7  . Colonoscopy  08/21/2010    Elongaed tortuous colon otherwise normal  . Esophagogastroduodenoscopy  07/08/10  Dr. Volney American esophageal erosions and excoriations consistent with erosive reflux esophagitis, hiatal hernia  . Knee arthroscopy      Right  . Esophagogastroduodenoscopy (egd) with propofol  02/25/2012    Procedure: ESOPHAGOGASTRODUODENOSCOPY (EGD) WITH PROPOFOL;  Surgeon: Daneil Dolin, MD;  Location: [facility];  Service: Endoscopy;  Laterality: N/A;  7:30 /POLYPHARMACY   Family History  Problem Relation Age of Onset  . Stroke Father 77  . Stroke Mother 62  . Colon cancer Neg Hx   . Crohn's disease Neg Hx   . Cirrhosis Neg Hx   . Ulcerative colitis Neg Hx   . Stomach cancer Neg Hx   . Emphysema Father     smoked   History  Substance Use Topics  . Smoking status: Current Every  Day Smoker -- 0.25 packs/day for 45 years    Types: Cigarettes  . Smokeless tobacco: Never Used     Comment: currently smoking 3 cigs per day and also uses VAPE 01/03/14  . Alcohol Use: No    Review of Systems  Constitutional: Positive for activity change.  Genitourinary: Negative for decreased urine volume.  Skin: Positive for wound.       Multiple bruises  Psychiatric/Behavioral: Positive for confusion.  All other systems reviewed and are negative.  Allergies  Ms contin  Home Medications   Prior to Admission medications   Medication Sig Start Date End Date Taking? Authorizing Provider  clonazePAM (KLONOPIN) 0.5 MG tablet Take 1 tablet by mouth 2 (two) times daily. 1 in the morning and two in the evening 04/02/14  Yes Historical Provider, MD  aspirin 81 MG tablet Take 81 mg by mouth daily.    Historical Provider, MD  atorvastatin (LIPITOR) 20 MG tablet Take 1 tablet (20 mg total) by mouth daily. 02/07/14   Rise Mu, PA-C  buPROPion (WELLBUTRIN XL) 300 MG 24 hr tablet Take 1 tablet (300 mg total) by mouth daily. 02/07/14   Historical Provider, MD  carvedilol (COREG) 3.125 MG tablet Take 3.125 mg by mouth 2 (two) times daily. 02/07/14   Historical Provider, MD  clonazePAM (KLONOPIN) 0.5 MG tablet Take 1 tablet (0.5 mg total) by mouth 2 (two) times daily as needed for anxiety. 01/24/14   Hosie Poisson, MD  FLUoxetine (PROZAC) 20 MG capsule Take 80 mg by mouth daily.    Historical Provider, MD  furosemide (LASIX) 40 MG tablet Take 1 tablet (40 mg total) by mouth daily as needed. 03/27/14   Minna Merritts, MD  gabapentin (NEURONTIN) 100 MG capsule Take 300 mg by mouth 3 (three) times daily.    Historical Provider, MD  gabapentin (NEURONTIN) 300 MG capsule Take 300 mg by mouth daily. 03/19/14   Historical Provider, MD  insulin aspart protamine-insulin aspart (NOVOLOG 70/30) (70-30) 100 UNIT/ML injection Inject 10-20 Units into the skin 2 (two) times daily with a meal. Units vary based on  food consumed.    Historical Provider, MD  Melatonin 5 MG TBDP Take 5 mg by mouth as needed.     Historical Provider, MD  morphine (MSIR) 15 MG tablet Take 15 mg by mouth every 6 (six) hours as needed for severe pain. 01/24/14   Hosie Poisson, MD  Multiple Vitamins-Minerals (CENTRUM SILVER ULTRA MENS) TABS Take 1 tablet by mouth daily.      Historical Provider, MD  nitroGLYCERIN (NITROSTAT) 0.4 MG SL tablet Place 1 tablet (0.4 mg total) under the tongue every 5 (five) minutes as needed for chest pain. 01/03/14  Tanda Rockers, MD  ondansetron (ZOFRAN-ODT) 4 MG disintegrating tablet Take 1 tablet (4 mg total) by mouth every 4 (four) hours as needed for nausea. 01/27/12   Mahala Menghini, PA-C  pantoprazole (PROTONIX) 40 MG tablet Take 1 tablet (40 mg total) by mouth daily. 02/07/14   Areta Haber Dunn, PA-C  polyethylene glycol (MIRALAX / GLYCOLAX) packet Take 17 g by mouth daily as needed. For constipation    Historical Provider, MD  potassium chloride SA (K-DUR,KLOR-CON) 20 MEQ tablet Take 1 tablet (20 mEq total) by mouth daily. 02/07/14   Ryan M Dunn, PA-C  rOPINIRole (REQUIP) 3 MG tablet Take 6 mg by mouth at bedtime. 06/28/13   Rexene Alberts, MD  senna (SENOKOT) 8.6 MG TABS tablet Take 2 tablets by mouth 2 (two) times daily.    Historical Provider, MD  traZODone (DESYREL) 100 MG tablet Take 100 mg by mouth as needed.     Historical Provider, MD   BP 126/65 mmHg  Pulse 83  Temp(Src) 98.1 F (36.7 C) (Oral)  Ht 6' (1.829 m)  Wt 204 lb (92.534 kg)  BMI 27.66 kg/m2  SpO2 97% Physical Exam  Constitutional: He appears well-developed and well-nourished. No distress.  HENT:  Head: Normocephalic and atraumatic.  Mouth/Throat: Oropharynx is clear and moist. No oropharyngeal exudate.  Eyes: Conjunctivae and EOM are normal. Pupils are equal, round, and reactive to light. Right eye exhibits no discharge. Left eye exhibits no discharge. No scleral icterus.  Neck: Normal range of motion. Neck supple. No JVD  present. No thyromegaly present.  Cardiovascular: Normal rate, regular rhythm, normal heart sounds and intact distal pulses.  Exam reveals no gallop and no friction rub.   No murmur heard. Pulmonary/Chest: Effort normal and breath sounds normal. No respiratory distress. He has no wheezes. He has no rales. He exhibits no tenderness.  Abdominal: Soft. Bowel sounds are normal. He exhibits no distension and no mass. There is tenderness ( Mild to moderate right upper quadrant, left upper quadrant and epigastric tenderness to palpation).  Musculoskeletal: Normal range of motion. He exhibits tenderness. He exhibits no edema.  Mild tenderness over the bruising of the right paraspinal thoracic area as well as the left, bruising over the proximal bilateral upper extremities  Lymphadenopathy:    He has no cervical adenopathy.  Neurological: He is alert. Coordination normal.  Skin: Skin is warm and dry. No rash noted. No erythema.  Psychiatric: He has a normal mood and affect. His behavior is normal.  Nursing note and vitals reviewed.   ED Course  Procedures (including critical care time) DIAGNOSTIC STUDIES: Oxygen Saturation is 97% on RA, normal by my interpretation.    COORDINATION OF CARE: 8:13 PM Discussed treatment plan with pt which includes CT head, CT abdomen pelvis, CT cervical spine and rib x-ray. Pt agreed to plan.  Labs Review Labs Reviewed  CBC WITH DIFFERENTIAL - Abnormal; Notable for the following:    RBC 3.92 (*)    Hemoglobin 11.1 (*)    HCT 34.3 (*)    All other components within normal limits  COMPREHENSIVE METABOLIC PANEL - Abnormal; Notable for the following:    BUN 39 (*)    Creatinine, Ser 3.11 (*)    Albumin 3.1 (*)    Total Bilirubin 0.2 (*)    GFR calc non Af Amer 20 (*)    GFR calc Af Amer 24 (*)    All other components within normal limits  MAGNESIUM  AMMONIA  URINALYSIS, ROUTINE W  REFLEX MICROSCOPIC  CBG MONITORING, ED    Imaging Review Ct Abdomen Pelvis  Wo Contrast  04/03/2014   CLINICAL DATA:  Generalized pain after multiple falls.  EXAM: CT CHEST, ABDOMEN AND PELVIS WITHOUT CONTRAST  TECHNIQUE: Multidetector CT imaging of the chest, abdomen and pelvis was performed following the standard protocol without IV contrast.  COMPARISON:  CT scan of July 07, 2010.  FINDINGS: CT CHEST FINDINGS  No pneumothorax or pleural effusion is noted. Multiple ill-defined densities with branching nodularity are noted in the right upper lobe most consistent with focal inflammation. Some associated peribronchial thickening is noted as well. No mediastinal adenopathy is noted. Mild coronary artery calcifications are noted. 5.6 x 2.3 cm mass is seen arising from the inferior portion of the left thyroid lobe. No significant osseous abnormality is noted in the chest. Spinal stimulator lead is seen in thoracic spinal canal.  CT ABDOMEN AND PELVIS FINDINGS  No gallstones are noted. No focal abnormality is noted in the liver, spleen or pancreas on these unenhanced images. Adrenal glands and kidneys appear normal. No hydronephrosis or renal obstruction is noted. No renal or ureteral calculi are noted. Stool is noted throughout the colon suggesting constipation. Urinary bladder appears normal. Atherosclerotic calcifications abdominal aorta are noted without aneurysm formation. No significant adenopathy is noted. No abnormal fluid collection is noted.  IMPRESSION: Multifocal ill-defined opacities are noted with branch shin nodularity in the right upper lobe most consistent with focal inflammation or pneumonia. Followup CT scan in 2-3 weeks is recommended to ensure resolution and rule out underlying nodules or mass.  5.6 x 2.3 cm mass is seen arising from the inferior portion of the left thyroid lobe. Thyroid ultrasound is recommended further evaluation.  Stool is noted throughout the colon suggesting constipation. No other significant abnormality is noted in the abdomen or pelvis.    Electronically Signed   By: Sabino Dick M.D.   On: 04/03/2014 21:04   Ct Head Wo Contrast  04/03/2014   CLINICAL DATA:  Multiple falls over the past 3 weeks, pain all over, prior neck surgery, history of seizures  EXAM: CT HEAD WITHOUT CONTRAST  CT CERVICAL SPINE WITHOUT CONTRAST  TECHNIQUE: Multidetector CT imaging of the head and cervical spine was performed following the standard protocol without intravenous contrast. Multiplanar CT image reconstructions of the cervical spine were also generated.  COMPARISON:  01/23/2014  FINDINGS: CT HEAD FINDINGS  No evidence of parenchymal hemorrhage or extra-axial fluid collection. No mass lesion, mass effect, or midline shift.  No CT evidence of acute infarction.  Mild intracranial atherosclerosis.  Cerebral volume is within normal limits.  No ventriculomegaly.  The visualized paranasal sinuses are essentially clear. The mastoid air cells are unopacified.  No evidence of calvarial fracture.  CT CERVICAL SPINE FINDINGS  Straightening of the cervical spine.  Status post C4-7 ACDF with corpectomy at C5 and C6.  No evidence of fracture or dislocation. Vertebral body heights are maintained. Dens appears intact.  No prevertebral soft tissue swelling.  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  Visualized lung apices are notable for mild paraseptal emphysematous changes.  IMPRESSION: No evidence of acute intracranial abnormality. Mild intracranial atherosclerosis.  No evidence of traumatic injury to the cervical spine. Stable postsurgical changes at C4-7.   Electronically Signed   By: Julian Hy M.D.   On: 04/03/2014 20:51   Ct Chest Wo Contrast  04/03/2014   CLINICAL DATA:  Generalized pain after multiple falls.  EXAM: CT CHEST,  ABDOMEN AND PELVIS WITHOUT CONTRAST  TECHNIQUE: Multidetector CT imaging of the chest, abdomen and pelvis was performed following the standard protocol without IV contrast.  COMPARISON:  CT scan of July 07, 2010.   FINDINGS: CT CHEST FINDINGS  No pneumothorax or pleural effusion is noted. Multiple ill-defined densities with branching nodularity are noted in the right upper lobe most consistent with focal inflammation. Some associated peribronchial thickening is noted as well. No mediastinal adenopathy is noted. Mild coronary artery calcifications are noted. 5.6 x 2.3 cm mass is seen arising from the inferior portion of the left thyroid lobe. No significant osseous abnormality is noted in the chest. Spinal stimulator lead is seen in thoracic spinal canal.  CT ABDOMEN AND PELVIS FINDINGS  No gallstones are noted. No focal abnormality is noted in the liver, spleen or pancreas on these unenhanced images. Adrenal glands and kidneys appear normal. No hydronephrosis or renal obstruction is noted. No renal or ureteral calculi are noted. Stool is noted throughout the colon suggesting constipation. Urinary bladder appears normal. Atherosclerotic calcifications abdominal aorta are noted without aneurysm formation. No significant adenopathy is noted. No abnormal fluid collection is noted.  IMPRESSION: Multifocal ill-defined opacities are noted with branch shin nodularity in the right upper lobe most consistent with focal inflammation or pneumonia. Followup CT scan in 2-3 weeks is recommended to ensure resolution and rule out underlying nodules or mass.  5.6 x 2.3 cm mass is seen arising from the inferior portion of the left thyroid lobe. Thyroid ultrasound is recommended further evaluation.  Stool is noted throughout the colon suggesting constipation. No other significant abnormality is noted in the abdomen or pelvis.   Electronically Signed   By: Sabino Dick M.D.   On: 04/03/2014 21:04   Ct Cervical Spine Wo Contrast  04/03/2014   CLINICAL DATA:  Multiple falls over the past 3 weeks, pain all over, prior neck surgery, history of seizures  EXAM: CT HEAD WITHOUT CONTRAST  CT CERVICAL SPINE WITHOUT CONTRAST  TECHNIQUE: Multidetector CT  imaging of the head and cervical spine was performed following the standard protocol without intravenous contrast. Multiplanar CT image reconstructions of the cervical spine were also generated.  COMPARISON:  01/23/2014  FINDINGS: CT HEAD FINDINGS  No evidence of parenchymal hemorrhage or extra-axial fluid collection. No mass lesion, mass effect, or midline shift.  No CT evidence of acute infarction.  Mild intracranial atherosclerosis.  Cerebral volume is within normal limits.  No ventriculomegaly.  The visualized paranasal sinuses are essentially clear. The mastoid air cells are unopacified.  No evidence of calvarial fracture.  CT CERVICAL SPINE FINDINGS  Straightening of the cervical spine.  Status post C4-7 ACDF with corpectomy at C5 and C6.  No evidence of fracture or dislocation. Vertebral body heights are maintained. Dens appears intact.  No prevertebral soft tissue swelling.  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  Visualized lung apices are notable for mild paraseptal emphysematous changes.  IMPRESSION: No evidence of acute intracranial abnormality. Mild intracranial atherosclerosis.  No evidence of traumatic injury to the cervical spine. Stable postsurgical changes at C4-7.   Electronically Signed   By: Julian Hy M.D.   On: 04/03/2014 20:51     EKG Interpretation   Date/Time:  Tuesday April 03 2014 21:46:03 EST Ventricular Rate:  76 PR Interval:  163 QRS Duration: 127 QT Interval:  436 QTC Calculation: 490 R Axis:   -13 Text Interpretation:  Sinus rhythm possible delta wave Nonspecific T wave  abnormality Abnormal  ekg since last tracing no significant change  Confirmed by Sabra Heck  MD, [place] ([id number]) on 04/03/2014 9:54:58 PM      MDM   Final diagnoses:  Trauma  CAP (community acquired pneumonia)  Acute on chronic renal failure    CT scan shows that there is a community-acquired pneumonia, there is not appear to be any signs of intracranial  injury, spinal fractures or bony injuries of the thorax. There does appear to be an abnormal thyroid mass, there does appear to be community-acquired pneumonia, renal function has decreased significantly. At this time the patient will need admission to the hospital for his generalized weakness, consistent orthostatic changes in his blood pressure. I discussed these findings with the patient and the hospitalist, Dr. Barbaraann Faster will admit. *  Meds given in ED:  Medications  cefTRIAXone (ROCEPHIN) 1 g in dextrose 5 % 50 mL IVPB (0 g Intravenous Stopped 04/03/14 2209)  azithromycin (ZITHROMAX) tablet 500 mg (500 mg Oral Given 04/03/14 2139)    New Prescriptions   No medications on file    I personally performed the services described in this documentation, which was scribed in my presence. The recorded information has been reviewed and is accurate.     Johnna Acosta, MD 04/03/14 2223

## 2014-04-03 NOTE — H&P (Signed)
[facility] History and Physical  CHAI ROUTH RXV:[id number] DOB: [birth year] DOA: 04/03/2014  Referring physician: Dr. Sabra Heck - A{ED PCP: Glo Herring., MD   Chief Complaint: weakness and falls  HPI: [name] is a 58 y.o. male  porgressive weakness and falls over the past 3 wks. Also f Reports 15-20 falls during this time. Falls are related to balance per pt., but endorses feeling lightheaded often.  Denies CP, palpitations, , HA, syncope. Reports feeling more and more confused for the past 3 days with difficulty finding words. Associated w/ intermittent shaking. Pt states that he's had the symptoms of confusion before when he was taken off his klonopin.   Seen by Dr. Rockey Situ 1 week ago and was noted to be hypotensive and orthostatic. Lasix held at that time. No worsening of LE swelling since that time.   Admitted for 2 weeks in early November for HCAP.    Review of Systems:  Constitutional:  Denies night sweats, Fevers, chills, fatigue.  HEENT:  No headaches, Difficulty swallowing,Tooth/dental problems,Sore throat,  No sneezing, itching, ear ache, nasal congestion, post nasal drip,  Cardio-vascular:  No chest pain, PND,  palpitations  GI:  No heartburn, indigestion, abdominal pain, nausea, vomiting, diarrhea, change in bowel habits, loss of appetite  Resp:  No sod.No change in color of mucus.No wheezing.No chest wall deformity  Skin: hortness of breath with exertion or at rest. No excess mucus, no productive cough, No non-productive cough, No coughing up of blo no rash or lesions.  GU:  no dysuria, change in color of urine, no urgency or frequency. No flank pain.  Musculoskeletal:  No joint pain or swelling. No decreased range of motion. No back pain.  Psych:  No change in mood or affect. No depression or anxiety. No memory loss.   Past Medical History  Diagnosis Date  . Type 2 diabetes mellitus   . Essential hypertension, benign   . Nonischemic  cardiomyopathy     LVEF 20-25%, prior history of LV mural thrombus on Coumadin.  . CKD (chronic kidney disease) stage 3, GFR 30-59 ml/min   . Arthritis   . Obesity   . Depression   . Sleep apnea   . Diabetic neuropathy   . Coronary atherosclerosis of native coronary artery     Nonobstructive by cardiac catheterization September 2008  . Esophagitis, reflux 07/07/10    MW tear, erosive reflux esophagitis, 2cm hh  . S/P colonoscopy April 2012    Rourk: torturous colon, hyperplastic polyps  . Tremor   . Diverticulum   . Hiatal hernia   . Gastroparesis 11/2010    No emptying at two hours.   . Chronic back pain   . Clonic seizure     Controlled with klonodine  . Acute respiratory failure with hypoxia 06/23/2013    Due to opiate toxicity. Intubated & extubated  . COPD (chronic obstructive pulmonary disease)   . CHF (congestive heart failure)    Past Surgical History  Procedure Laterality Date  . Neck surgery      Discectomy  . Hand surgery      Rght-carpal tunnel  . Colonoscopy  before 2002    [place]  . Appendectomy    . Other surgical history      Bone graft-knee  . Anterior lumbar corpectomy w/ fusion      C5-6/C4-7  . Colonoscopy  08/21/2010    Elongaed tortuous colon otherwise normal  . Esophagogastroduodenoscopy  07/08/10    Dr. Volney American esophageal  erosions and excoriations consistent with erosive reflux esophagitis, hiatal hernia  . Knee arthroscopy      Right  . Esophagogastroduodenoscopy (egd) with propofol  02/25/2012    Procedure: ESOPHAGOGASTRODUODENOSCOPY (EGD) WITH PROPOFOL;  Surgeon: Daneil Dolin, MD;  Location: [facility];  Service: Endoscopy;  Laterality: N/A;  7:30 /POLYPHARMACY   Social History:  reports that he has been smoking Cigarettes.  He has a 11.25 pack-year smoking history. He has never used smokeless tobacco. He reports that he does not drink alcohol or use illicit drugs.  Allergies  Allergen Reactions  . Ms Contin [Morphine Sulfate Er]      Doesn't like to take in liquid form( burns and doesn't help) (pill form works fine)     Family History  Problem Relation Age of Onset  . Stroke Father 110  . Emphysema Father     smoked  . Stroke Mother 81  . Colon cancer Neg Hx   . Crohn's disease Neg Hx   . Cirrhosis Neg Hx   . Ulcerative colitis Neg Hx   . Stomach cancer Neg Hx      Prior to Admission medications   Medication Sig Start Date End Date Taking? Authorizing Provider  clonazePAM (KLONOPIN) 0.5 MG tablet Take 1 tablet by mouth 2 (two) times daily. 1 in the morning and two in the evening 04/02/14  Yes Historical Provider, MD  aspirin 81 MG tablet Take 81 mg by mouth daily.    Historical Provider, MD  atorvastatin (LIPITOR) 20 MG tablet Take 1 tablet (20 mg total) by mouth daily. 02/07/14   Rise Mu, PA-C  buPROPion (WELLBUTRIN XL) 300 MG 24 hr tablet Take 1 tablet (300 mg total) by mouth daily. 02/07/14   Historical Provider, MD  carvedilol (COREG) 3.125 MG tablet Take 3.125 mg by mouth 2 (two) times daily. 02/07/14   Historical Provider, MD  clonazePAM (KLONOPIN) 0.5 MG tablet Take 1 tablet (0.5 mg total) by mouth 2 (two) times daily as needed for anxiety. 01/24/14   Hosie Poisson, MD  FLUoxetine (PROZAC) 20 MG capsule Take 80 mg by mouth daily.    Historical Provider, MD  furosemide (LASIX) 40 MG tablet Take 1 tablet (40 mg total) by mouth daily as needed. 03/27/14   Minna Merritts, MD  gabapentin (NEURONTIN) 100 MG capsule Take 300 mg by mouth 3 (three) times daily.    Historical Provider, MD  gabapentin (NEURONTIN) 300 MG capsule Take 300 mg by mouth daily. 03/19/14   Historical Provider, MD  insulin aspart protamine-insulin aspart (NOVOLOG 70/30) (70-30) 100 UNIT/ML injection Inject 10-20 Units into the skin 2 (two) times daily with a meal. Units vary based on food consumed.    Historical Provider, MD  Melatonin 5 MG TBDP Take 5 mg by mouth as needed.     Historical Provider, MD  morphine (MSIR) 15 MG tablet Take 15 mg  by mouth every 6 (six) hours as needed for severe pain. 01/24/14   Hosie Poisson, MD  Multiple Vitamins-Minerals (CENTRUM SILVER ULTRA MENS) TABS Take 1 tablet by mouth daily.      Historical Provider, MD  nitroGLYCERIN (NITROSTAT) 0.4 MG SL tablet Place 1 tablet (0.4 mg total) under the tongue every 5 (five) minutes as needed for chest pain. 01/03/14   Tanda Rockers, MD  ondansetron (ZOFRAN-ODT) 4 MG disintegrating tablet Take 1 tablet (4 mg total) by mouth every 4 (four) hours as needed for nausea. 01/27/12   Mahala Menghini, PA-C  pantoprazole (  PROTONIX) 40 MG tablet Take 1 tablet (40 mg total) by mouth daily. 02/07/14   Areta Haber Dunn, PA-C  polyethylene glycol (MIRALAX / GLYCOLAX) packet Take 17 g by mouth daily as needed. For constipation    Historical Provider, MD  potassium chloride SA (K-DUR,KLOR-CON) 20 MEQ tablet Take 1 tablet (20 mEq total) by mouth daily. 02/07/14   Ryan M Dunn, PA-C  rOPINIRole (REQUIP) 3 MG tablet Take 6 mg by mouth at bedtime. 06/28/13   Rexene Alberts, MD  senna (SENOKOT) 8.6 MG TABS tablet Take 2 tablets by mouth 2 (two) times daily.    Historical Provider, MD  traZODone (DESYREL) 100 MG tablet Take 100 mg by mouth as needed.     Historical Provider, MD   Physical Exam: Filed Vitals:   04/03/14 1920 04/03/14 1923 04/03/14 2222 04/03/14 2328  BP: 126/65  133/78 128/68  Pulse: 83  83 82  Temp: 98.1 F (36.7 C)  98.2 F (36.8 C) 98.6 F (37 C)  TempSrc: Oral   Oral  Resp:   18 20  Height:  6' (1.829 m)  6' (1.829 m)  Weight:  92.534 kg (204 lb)  93.033 kg (205 lb 1.6 oz)  SpO2: 97%  97% 96%    Wt Readings from Last 3 Encounters:  04/03/14 93.033 kg (205 lb 1.6 oz)  03/27/14 95.936 kg (211 lb 8 oz)  02/07/14 96.389 kg (212 lb 8 oz)    General: Appears calm and comfortable Eyes:  PERRL, normal lids, irises & conjunctiva ENT: Dry mucus membranes Neck:  no LAD, masses or thyromegaly Cardiovascular:  RRR, no m/r/g. No LE edema. Telemetry:  SR, no arrhythmias    Respiratory:  CTA bilaterally, no w/r/r. Normal respiratory effort. Abdomen:  soft, ntnd Skin:  no rash or induration seen on limited exam Musculoskeletal:  grossly normal tone BUE/BLE Psychiatric:  grossly normal mood and affect,  Neurologic: CN2-12 intact. Cerebellar function intact.  At times pt struggles to find words to speek. Thought process is logical and coherent          Labs on Admission:  Basic Metabolic Panel:  Recent Labs Lab 04/03/14 2059  NA 142  K 4.6  CL 105  CO2 26  GLUCOSE 95  BUN 39*  CREATININE 3.11*  CALCIUM 8.9  MG 2.1   Liver Function Tests:  Recent Labs Lab 04/03/14 2059  AST 16  ALT 8  ALKPHOS 74  BILITOT 0.2*  PROT 6.6  ALBUMIN 3.1*   No results for input(s): LIPASE, AMYLASE in the last 168 hours.  Recent Labs Lab 04/03/14 2059  AMMONIA 24   CBC:  Recent Labs Lab 04/03/14 2059  WBC 6.7  NEUTROABS 4.0  HGB 11.1*  HCT 34.3*  MCV 87.5  PLT 224   Cardiac Enzymes: No results for input(s): CKTOTAL, CKMB, CKMBINDEX, TROPONINI in the last 168 hours.  BNP (last 3 results)  Recent Labs  05/30/13 2025 06/23/13 1602 06/24/13 0542  PROBNP 9807.0* 2103.0* 9222.0*   CBG:  Recent Labs Lab 04/03/14 2219  GLUCAP 92    Radiological Exams on Admission: Ct Abdomen Pelvis Wo Contrast  04/03/2014   CLINICAL DATA:  Generalized pain after multiple falls.  EXAM: CT CHEST, ABDOMEN AND PELVIS WITHOUT CONTRAST  TECHNIQUE: Multidetector CT imaging of the chest, abdomen and pelvis was performed following the standard protocol without IV contrast.  COMPARISON:  CT scan of July 07, 2010.  FINDINGS: CT CHEST FINDINGS  No pneumothorax or pleural effusion is noted. Multiple  ill-defined densities with branching nodularity are noted in the right upper lobe most consistent with focal inflammation. Some associated peribronchial thickening is noted as well. No mediastinal adenopathy is noted. Mild coronary artery calcifications are noted. 5.6 x 2.3  cm mass is seen arising from the inferior portion of the left thyroid lobe. No significant osseous abnormality is noted in the chest. Spinal stimulator lead is seen in thoracic spinal canal.  CT ABDOMEN AND PELVIS FINDINGS  No gallstones are noted. No focal abnormality is noted in the liver, spleen or pancreas on these unenhanced images. Adrenal glands and kidneys appear normal. No hydronephrosis or renal obstruction is noted. No renal or ureteral calculi are noted. Stool is noted throughout the colon suggesting constipation. Urinary bladder appears normal. Atherosclerotic calcifications abdominal aorta are noted without aneurysm formation. No significant adenopathy is noted. No abnormal fluid collection is noted.  IMPRESSION: Multifocal ill-defined opacities are noted with branch shin nodularity in the right upper lobe most consistent with focal inflammation or pneumonia. Followup CT scan in 2-3 weeks is recommended to ensure resolution and rule out underlying nodules or mass.  5.6 x 2.3 cm mass is seen arising from the inferior portion of the left thyroid lobe. Thyroid ultrasound is recommended further evaluation.  Stool is noted throughout the colon suggesting constipation. No other significant abnormality is noted in the abdomen or pelvis.   Electronically Signed   By: Sabino Dick M.D.   On: 04/03/2014 21:04   Ct Head Wo Contrast  04/03/2014   CLINICAL DATA:  Multiple falls over the past 3 weeks, pain all over, prior neck surgery, history of seizures  EXAM: CT HEAD WITHOUT CONTRAST  CT CERVICAL SPINE WITHOUT CONTRAST  TECHNIQUE: Multidetector CT imaging of the head and cervical spine was performed following the standard protocol without intravenous contrast. Multiplanar CT image reconstructions of the cervical spine were also generated.  COMPARISON:  01/23/2014  FINDINGS: CT HEAD FINDINGS  No evidence of parenchymal hemorrhage or extra-axial fluid collection. No mass lesion, mass effect, or midline shift.   No CT evidence of acute infarction.  Mild intracranial atherosclerosis.  Cerebral volume is within normal limits.  No ventriculomegaly.  The visualized paranasal sinuses are essentially clear. The mastoid air cells are unopacified.  No evidence of calvarial fracture.  CT CERVICAL SPINE FINDINGS  Straightening of the cervical spine.  Status post C4-7 ACDF with corpectomy at C5 and C6.  No evidence of fracture or dislocation. Vertebral body heights are maintained. Dens appears intact.  No prevertebral soft tissue swelling.  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  Visualized lung apices are notable for mild paraseptal emphysematous changes.  IMPRESSION: No evidence of acute intracranial abnormality. Mild intracranial atherosclerosis.  No evidence of traumatic injury to the cervical spine. Stable postsurgical changes at C4-7.   Electronically Signed   By: Julian Hy M.D.   On: 04/03/2014 20:51   Ct Chest Wo Contrast  04/03/2014   CLINICAL DATA:  Generalized pain after multiple falls.  EXAM: CT CHEST, ABDOMEN AND PELVIS WITHOUT CONTRAST  TECHNIQUE: Multidetector CT imaging of the chest, abdomen and pelvis was performed following the standard protocol without IV contrast.  COMPARISON:  CT scan of July 07, 2010.  FINDINGS: CT CHEST FINDINGS  No pneumothorax or pleural effusion is noted. Multiple ill-defined densities with branching nodularity are noted in the right upper lobe most consistent with focal inflammation. Some associated peribronchial thickening is noted as well. No mediastinal adenopathy is noted. Mild coronary  artery calcifications are noted. 5.6 x 2.3 cm mass is seen arising from the inferior portion of the left thyroid lobe. No significant osseous abnormality is noted in the chest. Spinal stimulator lead is seen in thoracic spinal canal.  CT ABDOMEN AND PELVIS FINDINGS  No gallstones are noted. No focal abnormality is noted in the liver, spleen or pancreas on these  unenhanced images. Adrenal glands and kidneys appear normal. No hydronephrosis or renal obstruction is noted. No renal or ureteral calculi are noted. Stool is noted throughout the colon suggesting constipation. Urinary bladder appears normal. Atherosclerotic calcifications abdominal aorta are noted without aneurysm formation. No significant adenopathy is noted. No abnormal fluid collection is noted.  IMPRESSION: Multifocal ill-defined opacities are noted with branch shin nodularity in the right upper lobe most consistent with focal inflammation or pneumonia. Followup CT scan in 2-3 weeks is recommended to ensure resolution and rule out underlying nodules or mass.  5.6 x 2.3 cm mass is seen arising from the inferior portion of the left thyroid lobe. Thyroid ultrasound is recommended further evaluation.  Stool is noted throughout the colon suggesting constipation. No other significant abnormality is noted in the abdomen or pelvis.   Electronically Signed   By: Sabino Dick M.D.   On: 04/03/2014 21:04   Ct Cervical Spine Wo Contrast  04/03/2014   CLINICAL DATA:  Multiple falls over the past 3 weeks, pain all over, prior neck surgery, history of seizures  EXAM: CT HEAD WITHOUT CONTRAST  CT CERVICAL SPINE WITHOUT CONTRAST  TECHNIQUE: Multidetector CT imaging of the head and cervical spine was performed following the standard protocol without intravenous contrast. Multiplanar CT image reconstructions of the cervical spine were also generated.  COMPARISON:  01/23/2014  FINDINGS: CT HEAD FINDINGS  No evidence of parenchymal hemorrhage or extra-axial fluid collection. No mass lesion, mass effect, or midline shift.  No CT evidence of acute infarction.  Mild intracranial atherosclerosis.  Cerebral volume is within normal limits.  No ventriculomegaly.  The visualized paranasal sinuses are essentially clear. The mastoid air cells are unopacified.  No evidence of calvarial fracture.  CT CERVICAL SPINE FINDINGS  Straightening  of the cervical spine.  Status post C4-7 ACDF with corpectomy at C5 and C6.  No evidence of fracture or dislocation. Vertebral body heights are maintained. Dens appears intact.  No prevertebral soft tissue swelling.  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  Visualized lung apices are notable for mild paraseptal emphysematous changes.  IMPRESSION: No evidence of acute intracranial abnormality. Mild intracranial atherosclerosis.  No evidence of traumatic injury to the cervical spine. Stable postsurgical changes at C4-7.   Electronically Signed   By: Julian Hy M.D.   On: 04/03/2014 20:51    EKG: Independently reviewed. NSR, LVH, ?? Q wave  Assessment/Plan Principal Problem:   Hypotension Active Problems:   Hyperlipidemia   OSA on CPAP   DM type 2 with diabetic peripheral neuropathy   Altered mental state   Acute on chronic renal failure   Orthostatic hypotension   CAP (community acquired pneumonia)   Acute on chronic combined systolic and diastolic heart failure   HCAP (healthcare-associated pneumonia)  Non-ischemic cardiomyopathy Wt loss MRI brain  Hypotension: likely multifactorial. CHF/worsening cardiac function, dehydration, infeciton/HCAP, and medication induced. Pt stated today that he was supposed to stop his lasix but is unsure if he actually did. Orthostatics + w/ SBP drop of 62mmHg - 1L NS bolus - NS 63ml/hr - hold lasix - continue low dose  bblocker due to CHF - Echo if not improving by am  Altered mental status: odd presentation. Pt feels this is due to stopping benzo as it correlates w/ medication change and has had identical symptoms in the past. Speech pattern consistent w/ L temporal stroke, though CT negative. MRI is not an option due to spinal surgery. Polypharmacy?? - restart klonopin - UDS - ETOH - Neuro consult in the am if not improved.   Acute on Chronic kidney disease: Cr 3.11 BUN 39, baseline ~2. Likely secondary to  dehydration and hypoperfusion.  - 1L NS bolus - NS 81ml/hr - BMET in am  HCAP: question true HCAP given likely residual inflammation from recent pneumonia, but due to current condition of AMS and hypotension will treat. CTX and Azithro in ED - cephapime and Vanc per protocol   Chronic diastolic and Systolic CHF: mild acute exacerbation complicated by renal dysfunction.  BNP 9222.  Echo 06/24/13 EF 20-25% w/ diffuse hypokinesis and Grade 1 diastolic dysfunction. - hold lasix - continue home dose of carvedilol (very low dose 3.25) - Monitor closely as pt requiring IVF   DM2: Las A1c 10. On Novolog 70./30 at home - SSI - A1c  Tobacco use:  1/2ppd - nicotine patch    Depression:  - continue wellbutrin and prozac  GERD:  - continue protonix  HLD:  - continue lipitor  Code Status: FULL DVT Prophylaxis: Heparin Family Communication: none Disposition Plan: pending  Time spent: > 70 min in direct pt care and coordination  [place], [facility] [email or web address] Password [email or web address]

## 2014-04-04 ENCOUNTER — Inpatient Hospital Stay (HOSPITAL_COMMUNITY): Payer: Medicare HMO

## 2014-04-04 ENCOUNTER — Telehealth: Payer: Self-pay

## 2014-04-04 DIAGNOSIS — I5042 Chronic combined systolic (congestive) and diastolic (congestive) heart failure: Secondary | ICD-10-CM | POA: Diagnosis present

## 2014-04-04 DIAGNOSIS — J189 Pneumonia, unspecified organism: Secondary | ICD-10-CM

## 2014-04-04 DIAGNOSIS — G4733 Obstructive sleep apnea (adult) (pediatric): Secondary | ICD-10-CM

## 2014-04-04 DIAGNOSIS — N179 Acute kidney failure, unspecified: Secondary | ICD-10-CM

## 2014-04-04 DIAGNOSIS — N189 Chronic kidney disease, unspecified: Secondary | ICD-10-CM

## 2014-04-04 DIAGNOSIS — E0789 Other specified disorders of thyroid: Secondary | ICD-10-CM | POA: Diagnosis present

## 2014-04-04 DIAGNOSIS — D497 Neoplasm of unspecified behavior of endocrine glands and other parts of nervous system: Secondary | ICD-10-CM

## 2014-04-04 DIAGNOSIS — I5043 Acute on chronic combined systolic (congestive) and diastolic (congestive) heart failure: Secondary | ICD-10-CM

## 2014-04-04 DIAGNOSIS — I959 Hypotension, unspecified: Secondary | ICD-10-CM | POA: Diagnosis present

## 2014-04-04 HISTORY — DX: Pneumonia, unspecified organism: J18.9

## 2014-04-04 LAB — BASIC METABOLIC PANEL
Anion gap: 10 (ref 5–15)
BUN: 37 mg/dL — AB (ref 6–23)
CHLORIDE: 105 meq/L (ref 96–112)
CO2: 26 meq/L (ref 19–32)
CREATININE: 2.95 mg/dL — AB (ref 0.50–1.35)
Calcium: 8.5 mg/dL (ref 8.4–10.5)
GFR calc Af Amer: 25 mL/min — ABNORMAL LOW (ref >90)
GFR calc non Af Amer: 22 mL/min — ABNORMAL LOW (ref >90)
Glucose, Bld: 159 mg/dL — ABNORMAL HIGH (ref 70–99)
POTASSIUM: 4.6 meq/L (ref 3.7–5.3)
Sodium: 141 mEq/L (ref 137–147)

## 2014-04-04 LAB — CBC
HEMATOCRIT: 32.2 % — AB (ref 39.0–52.0)
Hemoglobin: 10.6 g/dL — ABNORMAL LOW (ref 13.0–17.0)
MCH: 28.6 pg (ref 26.0–34.0)
MCHC: 32.9 g/dL (ref 30.0–36.0)
MCV: 86.8 fL (ref 78.0–100.0)
Platelets: 207 10*3/uL (ref 150–400)
RBC: 3.71 MIL/uL — AB (ref 4.22–5.81)
RDW: 15.4 % (ref 11.5–15.5)
WBC: 7.2 10*3/uL (ref 4.0–10.5)

## 2014-04-04 LAB — GLUCOSE, CAPILLARY
GLUCOSE-CAPILLARY: 76 mg/dL (ref 70–99)
GLUCOSE-CAPILLARY: 161 mg/dL — AB (ref 70–99)
Glucose-Capillary: 162 mg/dL — ABNORMAL HIGH (ref 70–99)
Glucose-Capillary: 185 mg/dL — ABNORMAL HIGH (ref 70–99)

## 2014-04-04 LAB — T4, FREE: Free T4: 1.07 ng/dL (ref 0.80–1.80)

## 2014-04-04 LAB — HEMOGLOBIN A1C
Hgb A1c MFr Bld: 9.1 % — ABNORMAL HIGH (ref <5.7)
Mean Plasma Glucose: 214 mg/dL — ABNORMAL HIGH (ref <117)

## 2014-04-04 LAB — HIV ANTIBODY (ROUTINE TESTING W REFLEX): HIV: NONREACTIVE

## 2014-04-04 LAB — TSH: TSH: 0.531 u[IU]/mL (ref 0.350–4.500)

## 2014-04-04 LAB — ETHANOL

## 2014-04-04 MED ORDER — MORPHINE SULFATE 2 MG/ML IJ SOLN
2.0000 mg | INTRAMUSCULAR | Status: DC | PRN
  Administered 2014-04-04: 2 mg via INTRAVENOUS
  Filled 2014-04-04 (×2): qty 1

## 2014-04-04 MED ORDER — VANCOMYCIN HCL 10 G IV SOLR
1500.0000 mg | Freq: Once | INTRAVENOUS | Status: AC
  Administered 2014-04-04: 1500 mg via INTRAVENOUS
  Filled 2014-04-04: qty 1500

## 2014-04-04 MED ORDER — INSULIN ASPART 100 UNIT/ML ~~LOC~~ SOLN: 0.0000 [IU] | Freq: Every day | SUBCUTANEOUS | Status: DC

## 2014-04-04 MED ORDER — DEXTROSE 5 % IV SOLN
INTRAVENOUS | Status: AC
  Filled 2014-04-04: qty 1

## 2014-04-04 MED ORDER — POTASSIUM CHLORIDE CRYS ER 20 MEQ PO TBCR
20.0000 meq | EXTENDED_RELEASE_TABLET | Freq: Every day | ORAL | Status: DC
  Administered 2014-04-04 – 2014-04-07 (×4): 20 meq via ORAL
  Filled 2014-04-04 (×4): qty 1

## 2014-04-04 MED ORDER — MORPHINE SULFATE 4 MG/ML IJ SOLN
4.0000 mg | INTRAMUSCULAR | Status: DC | PRN
  Administered 2014-04-04: 4 mg via INTRAVENOUS
  Filled 2014-04-04: qty 1

## 2014-04-04 MED ORDER — INSULIN ASPART 100 UNIT/ML ~~LOC~~ SOLN
0.0000 [IU] | Freq: Three times a day (TID) | SUBCUTANEOUS | Status: DC
  Administered 2014-04-04: 4 [IU] via SUBCUTANEOUS
  Administered 2014-04-04 – 2014-04-05 (×2): 3 [IU] via SUBCUTANEOUS
  Administered 2014-04-05 – 2014-04-06 (×2): 4 [IU] via SUBCUTANEOUS
  Administered 2014-04-06: 11 [IU] via SUBCUTANEOUS
  Administered 2014-04-07: 4 [IU] via SUBCUTANEOUS
  Administered 2014-04-07: 3 [IU] via SUBCUTANEOUS

## 2014-04-04 MED ORDER — ROPINIROLE HCL 0.25 MG PO TABS
ORAL_TABLET | ORAL | Status: AC
  Filled 2014-04-04: qty 4

## 2014-04-04 MED ORDER — CARVEDILOL 3.125 MG PO TABS
3.1250 mg | ORAL_TABLET | Freq: Two times a day (BID) | ORAL | Status: DC
  Administered 2014-04-04 – 2014-04-06 (×6): 3.125 mg via ORAL
  Filled 2014-04-04 (×6): qty 1

## 2014-04-04 MED ORDER — MORPHINE SULFATE 15 MG PO TABS
15.0000 mg | ORAL_TABLET | Freq: Four times a day (QID) | ORAL | Status: DC | PRN
  Administered 2014-04-04 – 2014-04-05 (×3): 15 mg via ORAL
  Filled 2014-04-04 (×3): qty 1

## 2014-04-04 MED ORDER — BUPROPION HCL ER (XL) 300 MG PO TB24
300.0000 mg | ORAL_TABLET | Freq: Every day | ORAL | Status: DC
  Administered 2014-04-04 – 2014-04-07 (×4): 300 mg via ORAL
  Filled 2014-04-04 (×6): qty 1

## 2014-04-04 MED ORDER — GABAPENTIN 300 MG PO CAPS
300.0000 mg | ORAL_CAPSULE | Freq: Three times a day (TID) | ORAL | Status: DC
  Administered 2014-04-04 – 2014-04-05 (×4): 300 mg via ORAL
  Filled 2014-04-04 (×4): qty 1

## 2014-04-04 MED ORDER — ASPIRIN EC 81 MG PO TBEC
81.0000 mg | DELAYED_RELEASE_TABLET | Freq: Every day | ORAL | Status: DC
  Administered 2014-04-04 – 2014-04-07 (×4): 81 mg via ORAL
  Filled 2014-04-04 (×4): qty 1

## 2014-04-04 MED ORDER — FENTANYL 75 MCG/HR TD PT72
75.0000 ug | MEDICATED_PATCH | TRANSDERMAL | Status: DC
  Administered 2014-04-04: 75 ug via TRANSDERMAL
  Filled 2014-04-04: qty 1

## 2014-04-04 MED ORDER — ONDANSETRON HCL 4 MG/2ML IJ SOLN
4.0000 mg | Freq: Four times a day (QID) | INTRAMUSCULAR | Status: DC | PRN
  Administered 2014-04-04: 4 mg via INTRAVENOUS

## 2014-04-04 MED ORDER — ROPINIROLE HCL 1 MG PO TABS
ORAL_TABLET | ORAL | Status: AC
  Filled 2014-04-04: qty 5

## 2014-04-04 MED ORDER — SENNA 8.6 MG PO TABS
2.0000 | ORAL_TABLET | Freq: Two times a day (BID) | ORAL | Status: DC
Start: 2014-04-04 — End: 2014-04-07
  Administered 2014-04-04 – 2014-04-07 (×7): 17.2 mg via ORAL
  Filled 2014-04-04 (×7): qty 2

## 2014-04-04 MED ORDER — PANTOPRAZOLE SODIUM 40 MG PO TBEC
40.0000 mg | DELAYED_RELEASE_TABLET | Freq: Every day | ORAL | Status: DC
  Administered 2014-04-04 – 2014-04-07 (×4): 40 mg via ORAL
  Filled 2014-04-04 (×4): qty 1

## 2014-04-04 MED ORDER — CLONAZEPAM 0.5 MG PO TABS: 0.5000 mg | ORAL_TABLET | Freq: Two times a day (BID) | ORAL | Status: DC | PRN

## 2014-04-04 MED ORDER — VANCOMYCIN HCL 10 G IV SOLR
1250.0000 mg | INTRAVENOUS | Status: DC
  Administered 2014-04-04 – 2014-04-07 (×3): 1250 mg via INTRAVENOUS
  Filled 2014-04-04 (×5): qty 1250

## 2014-04-04 MED ORDER — FLUOXETINE HCL 20 MG PO CAPS
80.0000 mg | ORAL_CAPSULE | Freq: Every day | ORAL | Status: DC
  Administered 2014-04-04 – 2014-04-07 (×4): 80 mg via ORAL
  Filled 2014-04-04 (×4): qty 4

## 2014-04-04 MED ORDER — ACETAMINOPHEN 650 MG RE SUPP: 650.0000 mg | Freq: Four times a day (QID) | RECTAL | Status: DC | PRN

## 2014-04-04 MED ORDER — ACETAMINOPHEN 325 MG PO TABS
650.0000 mg | ORAL_TABLET | Freq: Four times a day (QID) | ORAL | Status: DC | PRN
  Administered 2014-04-05 – 2014-04-07 (×3): 650 mg via ORAL
  Filled 2014-04-04 (×3): qty 2

## 2014-04-04 MED ORDER — ROPINIROLE HCL 1 MG PO TABS
6.0000 mg | ORAL_TABLET | Freq: Every day | ORAL | Status: DC
  Administered 2014-04-04 – 2014-04-06 (×4): 6 mg via ORAL
  Filled 2014-04-04 (×6): qty 6

## 2014-04-04 MED ORDER — FUROSEMIDE 20 MG PO TABS
20.0000 mg | ORAL_TABLET | Freq: Two times a day (BID) | ORAL | Status: DC
  Administered 2014-04-05 – 2014-04-07 (×5): 20 mg via ORAL
  Filled 2014-04-04 (×5): qty 1

## 2014-04-04 MED ORDER — PIPERACILLIN-TAZOBACTAM 3.375 G IVPB
3.3750 g | Freq: Three times a day (TID) | INTRAVENOUS | Status: DC
  Administered 2014-04-04 – 2014-04-07 (×9): 3.375 g via INTRAVENOUS
  Filled 2014-04-04 (×16): qty 50

## 2014-04-04 MED ORDER — CLONAZEPAM 0.5 MG PO TABS
0.5000 mg | ORAL_TABLET | Freq: Two times a day (BID) | ORAL | Status: DC
  Administered 2014-04-04 – 2014-04-05 (×3): 0.5 mg via ORAL
  Filled 2014-04-04 (×3): qty 1

## 2014-04-04 MED ORDER — DEXTROSE 5 % IV SOLN
1.0000 g | Freq: Two times a day (BID) | INTRAVENOUS | Status: DC
  Administered 2014-04-04: 1 g via INTRAVENOUS
  Filled 2014-04-04 (×3): qty 1

## 2014-04-04 MED ORDER — NICOTINE 14 MG/24HR TD PT24
14.0000 mg | MEDICATED_PATCH | Freq: Every day | TRANSDERMAL | Status: DC
  Administered 2014-04-04 – 2014-04-07 (×4): 14 mg via TRANSDERMAL
  Filled 2014-04-04 (×4): qty 1

## 2014-04-04 MED ORDER — TRAZODONE HCL 50 MG PO TABS
100.0000 mg | ORAL_TABLET | Freq: Every evening | ORAL | Status: DC | PRN
  Administered 2014-04-06 (×2): 100 mg via ORAL
  Filled 2014-04-04 (×2): qty 2

## 2014-04-04 MED ORDER — SODIUM CHLORIDE 0.9 % IV BOLUS (SEPSIS)
1000.0000 mL | Freq: Once | INTRAVENOUS | Status: AC
  Administered 2014-04-04: 1000 mL via INTRAVENOUS

## 2014-04-04 MED ORDER — HEPARIN SODIUM (PORCINE) 5000 UNIT/ML IJ SOLN
5000.0000 [IU] | Freq: Three times a day (TID) | INTRAMUSCULAR | Status: DC
  Administered 2014-04-04 – 2014-04-07 (×10): 5000 [IU] via SUBCUTANEOUS
  Filled 2014-04-04 (×10): qty 1

## 2014-04-04 MED ORDER — ATORVASTATIN CALCIUM 20 MG PO TABS
20.0000 mg | ORAL_TABLET | Freq: Every day | ORAL | Status: DC
  Administered 2014-04-04 – 2014-04-06 (×3): 20 mg via ORAL
  Filled 2014-04-04 (×3): qty 1

## 2014-04-04 MED ORDER — GABAPENTIN 300 MG PO CAPS: 300.0000 mg | ORAL_CAPSULE | Freq: Every day | ORAL | Status: DC

## 2014-04-04 MED ORDER — SODIUM CHLORIDE 0.9 % IV SOLN
INTRAVENOUS | Status: DC
Start: 2014-04-04 — End: 2014-04-07
  Administered 2014-04-04 – 2014-04-06 (×3): via INTRAVENOUS

## 2014-04-04 NOTE — Plan of Care (Signed)
Problem: Consults Goal: Skin Care Protocol Initiated - if Braden Score 18 or less If consults are not indicated, leave blank or document N/A  Outcome: Not Applicable Date Met:  19/80/22 Goal: Nutrition Consult-if indicated Outcome: Not Applicable Date Met:  17/98/10 Goal: Diabetes Guidelines if Diabetic/Glucose > 140 If diabetic or lab glucose is > 140 mg/dl - Initiate Diabetes/Hyperglycemia Guidelines & Document Interventions  Outcome: Not Applicable Date Met:  25/48/62  Problem: Phase I Progression Outcomes Goal: Dyspnea controlled at rest Outcome: Completed/Met Date Met:  04/04/14 Goal: Pain controlled with appropriate interventions Outcome: Progressing Goal: OOB as tolerated unless otherwise ordered Outcome: Completed/Met Date Met:  04/04/14 Goal: First antibiotic given within 6hrs of admit Outcome: Completed/Met Date Met:  04/04/14 Goal: Confirm chest x-ray completed Outcome: Completed/Met Date Met:  04/04/14 Goal: Consider Infectious Disease Consult Outcome: Not Applicable Date Met:  82/41/75 Goal: Consider pulmonary consult Outcome: Completed/Met Date Met:  04/04/14 Goal: Code status addressed with pt/family Outcome: Not Applicable Date Met:  30/10/40 Goal: Voiding-avoid urinary catheter unless indicated Outcome: Completed/Met Date Met:  04/04/14 Goal: Hemodynamically stable Outcome: Completed/Met Date Met:  04/04/14 Goal: Other Phase I Outcomes/Goals Outcome: Not Applicable Date Met:  45/91/36  Problem: Phase II Progression Outcomes Goal: Encourage coughing & deep breathing Outcome: Completed/Met Date Met:  04/04/14 Goal: Wean O2 if indicated Outcome: Not Applicable Date Met:  85/99/23 Patient able to maintain O2 sats >90% on RA.

## 2014-04-04 NOTE — Telephone Encounter (Signed)
Pt wife would like to talk with you about pt condition. States pt is in [place]

## 2014-04-04 NOTE — Clinical Social Work Note (Signed)
CSW received consult for possible SNF, as this is wife's request. Does not meet SNF criteria per PT. Awaiting OT and speech consults. CSW will follow up with pt/family tomorrow when these are completed to discuss disposition.  Benay Pike, [place]

## 2014-04-04 NOTE — Evaluation (Signed)
Physical Therapy Evaluation Patient Details Name: [name] MRN: [id number] DOB: April 15, 1956 Today's Date: 04/04/2014   History of Present Illness  [name] is a 58 y.o. male porgressive weakness and falls over the past 3 wks. Also Reports 15-20 falls during this time. Falls are related to balance per pt., but endorses feeling lightheaded often.  Denies CP, palpitations, , HA, syncope. Reports feeling more and more confused for the past 3 days with difficulty finding words. Associated w/ intermittent shaking. Pt states that he's had the symptoms of confusion before when he was taken off his klonopin.  Clinical Impression  Pt is a 58 year old male who presents to PT with dx of HCAP.  Pt asleep when PT entered room, however easily awoken with name though was lethargic when in bed.  During evaluation, pt was (I) with bed mobility skills and transfers and mod (I) with gait for 450 feet.  No overt LOB during gait, though pt does report a hx of falls in the past couple weeks.  Pt reports he thinks this is due to him being unable to get his klonopin medication (reports he was unable to afford medication).  Pt to be d/c from acute PT services as pt is at baseline level of function.  Pt may benefit from OPPT services to address balance secondary to hx of falls.  No DME recommendations.      Follow Up Recommendations Outpatient PT    Equipment Recommendations  None recommended by PT       Precautions / Restrictions Precautions Precautions: Fall Restrictions Weight Bearing Restrictions: No      Mobility  Bed Mobility Overal bed mobility: Independent                Transfers Overall transfer level: Independent                  Ambulation/Gait Ambulation/Gait assistance: Modified independent (Device/Increase time) Ambulation Distance (Feet): 450 Feet Assistive device: Straight cane Gait Pattern/deviations: Step-through pattern;Decreased dorsiflexion - right;Decreased  dorsiflexion - left; noted increased ER of the Lt LE during gait.    Gait velocity interpretation: Below normal speed for age/gender       Balance Overall balance assessment: History of Falls;Needs assistance (Hx of falls, which pt relates to being off his klonopin (no dizziness/lightheadedness reported)) Sitting-balance support: Feet supported;No upper extremity supported Sitting balance-Leahy Scale: Normal     Standing balance support: During functional activity;Single extremity supported Standing balance-Leahy Scale: Good Standing balance comment: No LOB during gait assessment (including ambulation in open/closed environement and navigating 90 degree/180 turns)                             Pertinent Vitals/Pain Pain Assessment: No/denies pain    Home Living Family/patient expects to be discharged to:: Private residence Living Arrangements: Spouse/significant other Available Help at Discharge: Family Type of Home: Mobile home Home Access: Ramped entrance     Home Layout: One level Home Equipment: Bedside commode;Walker - 4 wheels;Wheelchair - power;Cane - quad;Cane - single point      Prior Function Level of Independence: Independent with assistive device(s)                  Extremity/Trunk Assessment   Upper Extremity Assessment: Defer to OT evaluation           Lower Extremity Assessment: Overall WFL for tasks assessed (Generalized MMT for hips 4+/5, knee/ankle 5/5)  Communication   Communication: No difficulties  Cognition Arousal/Alertness: Lethargic Behavior During Therapy: WFL for tasks assessed/performed Overall Cognitive Status: Within Functional Limits for tasks assessed                       Assessment/Plan    PT Assessment All further PT needs can be met in the next venue of care  PT Diagnosis     PT Problem List Decreased balance  PT Treatment Interventions     PT Goals (Current goals can be found in the  Care Plan section) Acute Rehab PT Goals PT Goal Formulation: All assessment and education complete, DC therapy     End of Session Equipment Utilized During Treatment: Gait belt Activity Tolerance: Patient tolerated treatment well Patient left: in bed;with call bell/phone within reach;with bed alarm set           Time: 6483-0322 PT Time Calculation (min) (ACUTE ONLY): 16 min   Charges:   PT Evaluation $Initial PT Evaluation Tier I: 1 Procedure      Miquel Stacks 04/04/2014, 2:56 PM

## 2014-04-04 NOTE — Progress Notes (Signed)
Patient complains of pain 6/10 in his back from "C7 to L4" caused by past back surgeries. Patient states he takes MSIR 15mg  PO at home and 180mcg fentanyl patch. Med rec performed by pharmacist shows that fentanyl patch is not taken at home. Patient states [facility] [place] [facility] [place]. We called [facility] [place], but they do not have fentanyl patch on record. I attempted to call patient's wife in order to obtain information about Veteran's prescriptions, but I was unable to reach her. I asked patient to talk to his wife tomorrow when she visits about this information, so that we can confirm order for fentanyl patch. Patch has been removed until then per order of Dr. Caryn Section. Will pass on to oncoming shift.

## 2014-04-04 NOTE — Plan of Care (Signed)
Problem: Phase I Progression Outcomes Goal: OOB as tolerated unless otherwise ordered Outcome: Not Met (add Reason)     

## 2014-04-04 NOTE — Progress Notes (Signed)
ANTIBIOTIC CONSULT NOTE - INITIAL  Pharmacy Consult for Vancomycin & Zosyn Indication: HCAP  Allergies  Allergen Reactions  . Ms Contin [Morphine Sulfate Er]     Doesn't like to take in liquid form( burns and doesn't help) (pill form works fine)     Patient Measurements: Height: 6' (182.9 cm) Weight: 205 lb 1.6 oz (93.033 kg) IBW/kg (Calculated) : 77.6 Adjusted Body Weight: 83kg  Vital Signs: Temp: 98.6 F (37 C) (12/09 0420) Temp Source: Oral (12/09 0420) BP: 144/71 mmHg (12/09 0420) Pulse Rate: 76 (12/09 0420) Intake/Output from previous day: 12/08 0701 - 12/09 0700 In: -  Out: 51 [Urine:51] Intake/Output from this shift: Total I/O In: 240 [P.O.:240] Out: 700 [Urine:700]  Labs:  Recent Labs  04/03/14 2059 04/04/14 0141  WBC 6.7 7.2  HGB 11.1* 10.6*  PLT 224 207  CREATININE 3.11* 2.95*   Estimated Creatinine Clearance: 29.6 mL/min (by C-G formula based on Cr of 2.95). No results for input(s): VANCOTROUGH, VANCOPEAK, VANCORANDOM, GENTTROUGH, GENTPEAK, GENTRANDOM, TOBRATROUGH, TOBRAPEAK, TOBRARND, AMIKACINPEAK, AMIKACINTROU, AMIKACIN in the last 72 hours.   Microbiology: Recent Results (from the past 720 hour(s))  Culture, blood (routine x 2) Call MD if unable to obtain prior to antibiotics being given     Status: None (Preliminary result)   Collection Time: 04/04/14  1:41 AM  Result Value Ref Range Status   Specimen Description BLOOD LEFT FOREARM  Final   Special Requests BOTTLES DRAWN AEROBIC AND ANAEROBIC [place]  Final   Culture NO GROWTH <24 HRS  Final   Report Status PENDING  Incomplete  Culture, blood (routine x 2) Call MD if unable to obtain prior to antibiotics being given     Status: None (Preliminary result)   Collection Time: 04/04/14  1:41 AM  Result Value Ref Range Status   Specimen Description BLOOD RIGHT ANTECUBITAL  Final   Special Requests   Final    BOTTLES DRAWN AEROBIC AND ANAEROBIC AEB 10CC ANA Media   Culture NO GROWTH <24 HRS  Final    Report Status PENDING  Incomplete    Medical History: Past Medical History  Diagnosis Date  . Type 2 diabetes mellitus   . Essential hypertension, benign   . 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  . CKD (chronic kidney disease) stage 3, GFR 30-59 ml/min   . Arthritis   . Obesity   . Depression   . Sleep apnea   . Diabetic neuropathy   . Coronary atherosclerosis of native coronary artery     Nonobstructive by cardiac catheterization September 2008  . Esophagitis, reflux 07/07/10    MW tear, erosive reflux esophagitis, 2cm hh  . S/P colonoscopy April 2012    Rourk: torturous colon, hyperplastic polyps  . Tremor   . Diverticulum   . Hiatal hernia   . Gastroparesis 11/2010    No emptying at two hours.   . Chronic back pain   . Clonic seizure     Controlled with klonodine  . Acute respiratory failure with hypoxia 06/23/2013    Due to opiate toxicity. Intubated & extubated  . COPD (chronic obstructive pulmonary disease)   . CHF (congestive heart failure)     Medications:  Scheduled:  . aspirin EC  81 mg Oral Daily  . atorvastatin  20 mg Oral q1800  . buPROPion  300 mg Oral Daily  . carvedilol  3.125 mg Oral BID  . clonazePAM  0.5 mg Oral  BID  . fentaNYL  75 mcg Transdermal Q72H  . FLUoxetine  80 mg Oral Daily  . [START ON 04/05/2014] furosemide  20 mg Oral BID  . gabapentin  300 mg Oral TID  . heparin  5,000 Units Subcutaneous 3 times per day  . insulin aspart  0-20 Units Subcutaneous TID WC  . insulin aspart  0-5 Units Subcutaneous QHS  . nicotine  14 mg Transdermal Daily  . pantoprazole  40 mg Oral Daily  . piperacillin-tazobactam (ZOSYN)  IV  3.375 g Intravenous Q8H  . potassium chloride SA  20 mEq Oral Daily  . rOPINIRole  6 mg Oral QHS  . senna  2 tablet Oral BID  . vancomycin  1,250 mg Intravenous Q24H   Infusions:  . sodium chloride 70 mL/hr at 04/04/14 1057   PRN: acetaminophen **OR** acetaminophen,  morphine injection, ondansetron (ZOFRAN) IV, traZODone   Assessment: 58yo M admitted with altered mental status, progressive weakness & falls over past couple weeks.  He was hospitalized in November with PNA.   CXR + PNA and patient started on empiric, broad-spectrum antibiotics for HCAP.  Cefepime is being changed to Zosyn at this time due to concern for aspiration PNA.   He is afebrile with normal WBC.  Acute on chronic kidney disease noted (baseline Scr ~2).  CrCl currently >69ml/min.  Vancomycin 12/9>> Cefepime 12/9>>12/9 Zosyn 12/9>>  Goal of Therapy:  Vancomycin trough 15-20 mcg/ml  Plan:  1.  Continue Vancomycin 1250mg  IV q24h 2.  Check Vancomycin trough at steady state 3.  Zosyn 3.375gm IV Q8h to be infused over 4hrs 4.  Monitor renal function and cx data   Biagio Borg 04/04/2014,11:41 AM

## 2014-04-04 NOTE — Progress Notes (Signed)
Talked with patient about setting up BIPAP for the night and setting and pt requested not to wear because he doesn't like the mask we have and he wears the nasal pillow. But did request 4lpm cann for the night that he wears at night at home

## 2014-04-04 NOTE — Telephone Encounter (Signed)
Okay thank you

## 2014-04-04 NOTE — Care Management Note (Signed)
    Page 1 of 2   04/06/2014     1:26:50 PM CARE MANAGEMENT NOTE 04/06/2014  Patient:  [name],[name]   Account Number:  [id number]  Date Initiated:  04/04/2014  Documentation initiated by:  Theophilus Kinds  Subjective/Objective Assessment:   Pt admitted from home with pneumonia and hypotension. Pt lives with his wife but wife is stating that she is unable to care for him. Pt currently very drowsy and unable to converse with Cm. Attemtped to pts wife cell and home phone but     Action/Plan:   unable to get through. Will continue to try to contact wife and speak with pt once more alert. CSW is aware of pt and PT consult obtained.   Anticipated DC Date:  04/07/2014   Anticipated DC Plan:  [place] referral  Clinical Social Worker      DC Planning Services  CM consult      Choice offered to / List presented to:             Status of service:  Completed, signed off Medicare Important Message given?  YES (If response is "NO", the following Medicare IM given date fields will be blank) Date Medicare IM given:  04/06/2014 Medicare IM given by:  Vladimir Creeks Date Additional Medicare IM given:   Additional Medicare IM given by:    Discharge Disposition:  HOME/SELF CARE  Per UR Regulation:    If discussed at Long Length of Stay Meetings, dates discussed:    Comments:  04/06/2014 [place], RN, MSN, PCCN Pt plans to discharge home with self care over weekend. Pt states he does not want OP PT, he doesn't feel he needs it. Pt says he'll be living in [place] with daughter. Spoke with Anderson Malta at the [facility], will fax H&P and clinicals. No CM needs noted at this time.  04/04/14 [place], RN BSN CM Pts wife visiting pt and CM was able to to speak with her. Wife wants pt placed in SNF at discharge. Wife did also state that pts primary payer source is [place]. CM explained process for placement and need for [employer] to authorize once  evlauated by PT. Wife stated that she was calling pts CM with [employer] to enlist her help as well for placement. CSW is also aware of above. CM called [facility] and left message with pts CSW Anderson Malta [phone], ext [phone]) to discuss pt. Will continue to follow for discharge planning needs.  04/04/14 [place], RN BSN CM

## 2014-04-04 NOTE — Progress Notes (Signed)
ANTIBIOTIC CONSULT NOTE - INITIAL  Pharmacy Consult for vancomycin & cefepime Indication: HCAP  Allergies  Allergen Reactions  . Ms Contin [Morphine Sulfate Er]     Doesn't like to take in liquid form( burns and doesn't help) (pill form works fine)     Patient Measurements: Height: 6' (182.9 cm) Weight: 205 lb 1.6 oz (93.033 kg) IBW/kg (Calculated) : 77.6 Adjusted Body Weight: 83kg  Vital Signs: Temp: 98.6 F (37 C) (12/08 2328) Temp Source: Oral (12/08 2328) BP: 128/68 mmHg (12/08 2328) Pulse Rate: 82 (12/08 2328) Intake/Output from previous day:   Intake/Output from this shift:    Labs:  Recent Labs  04/03/14 2059  WBC 6.7  HGB 11.1*  PLT 224  CREATININE 3.11*   Estimated Creatinine Clearance: 28.1 mL/min (by C-G formula based on Cr of 3.11). No results for input(s): VANCOTROUGH, VANCOPEAK, VANCORANDOM, GENTTROUGH, GENTPEAK, GENTRANDOM, TOBRATROUGH, TOBRAPEAK, TOBRARND, AMIKACINPEAK, AMIKACINTROU, AMIKACIN in the last 72 hours.   Microbiology: No results found for this or any previous visit (from the past 720 hour(s)).  Medical History: Past Medical History  Diagnosis Date  . Type 2 diabetes mellitus   . Essential hypertension, benign   . 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  . CKD (chronic kidney disease) stage 3, GFR 30-59 ml/min   . Arthritis   . Obesity   . Depression   . Sleep apnea   . Diabetic neuropathy   . Coronary atherosclerosis of native coronary artery     Nonobstructive by cardiac catheterization September 2008  . Esophagitis, reflux 07/07/10    MW tear, erosive reflux esophagitis, 2cm hh  . S/P colonoscopy April 2012    Rourk: torturous colon, hyperplastic polyps  . Tremor   . Diverticulum   . Hiatal hernia   . Gastroparesis 11/2010    No emptying at two hours.   . Chronic back pain   . Clonic seizure     Controlled with klonodine  . Acute respiratory failure with hypoxia  06/23/2013    Due to opiate toxicity. Intubated & extubated  . COPD (chronic obstructive pulmonary disease)   . CHF (congestive heart failure)     Medications:  Scheduled:  . aspirin EC  81 mg Oral Daily  . atorvastatin  20 mg Oral q1800  . buPROPion  300 mg Oral Daily  . carvedilol  3.125 mg Oral BID  . ceFEPime (MAXIPIME) IV  1 g Intravenous Q12H  . FLUoxetine  80 mg Oral Daily  . gabapentin  300 mg Oral TID  . heparin  5,000 Units Subcutaneous 3 times per day  . insulin aspart  0-20 Units Subcutaneous TID WC  . insulin aspart  0-5 Units Subcutaneous QHS  . nicotine  14 mg Transdermal Daily  . pantoprazole  40 mg Oral Daily  . potassium chloride SA  20 mEq Oral Daily  . rOPINIRole  6 mg Oral QHS  . senna  2 tablet Oral BID  . sodium chloride  1,000 mL Intravenous Once  . vancomycin  1,250 mg Intravenous Q24H  . vancomycin  1,500 mg Intravenous Once   Infusions:  . sodium chloride     PRN: acetaminophen **OR** acetaminophen, clonazePAM, morphine injection, ondansetron (ZOFRAN) IV, traZODone   Assessment: 38yr large male with dx HCAP/CAP to be treated with vancomycin & cefepime (already had one dose each ceftriaxone & azithromycin).  Patient noted to be dehydrated at admission with elevated SCr, although has hx  of CKD with CrCl est to be 30-50ml/min.  Goal of Therapy:  Desire vancomycin steady state serum trough to be 15-43mcg/ml (treated last March on regimen 1250mg  q24-36h).  Cefepime dose to be adjusted for CrCl 30-75ml/min.  Plan:  1.  Vancomycin 1500mg  IV loading dose x 1  2.  Vancomycin 1250mg  IV q24h maintenance regimen 3.  Cefepime 1gm IV q12h 4.  Monitor for indices of infection and renal function 5.  Measure actual steady state vancomycin serum trough level as clinically indicated  Andrus Sharp E 04/04/2014,1:43 AM

## 2014-04-04 NOTE — Telephone Encounter (Signed)
Attempted to call pt's wife.  Contact # is "not a working number".

## 2014-04-04 NOTE — Telephone Encounter (Signed)
Attempted to call pt's wife again, but got message that # is not in service.

## 2014-04-04 NOTE — Progress Notes (Signed)
   04/04/14 1149  Mobility  Activity Ambulate in hall  Level of Assistance Modified independent, requires aide device or extra time  Assistive Device Front wheel walker  Distance Ambulated (ft) 250 ft  Ambulation Response Tolerated well

## 2014-04-04 NOTE — Therapy (Signed)
SPEECH PATHOLOGY  Consult received and chart reviewed. Pt. Known to this SLP from previous admission. Unable to see pt today. Pt is on a diet- will see Thursday late AM.  Thank you,  Genene Churn, [place]

## 2014-04-04 NOTE — Progress Notes (Signed)
[facility] PROGRESS NOTE  [name] ZSW:[id number] DOB: 27-Aug-1955 DOA: 04/03/2014 PCP: Family physician is NOT Dr. Gerarda Fraction    Code Status: Full code Family Communication: Discussed with patient; family not available Disposition Plan: Discharge when clinically appropriate   Consultants:  None  Procedures:  None  Antibiotics:  Vancomycin 12/8>>  Zosyn 12/9>>  Cefepime 12/8>>12/9  HPI/Subjective: The patient is drowsy after being given morphine for chronic pain, but is arousable and becomes alert and oriented. He has chronic neck and lower back pain, otherwise he has no complaints of chest pain, pleurisy, or chest congestion. He denies abdominal pain, nausea, vomiting.  Objective: Filed Vitals:   04/04/14 0420  BP: 144/71  Pulse: 76  Temp: 98.6 F (37 C)  Resp: 18    Intake/Output Summary (Last 24 hours) at 04/04/14 1023 Last data filed at 04/04/14 3220  Gross per 24 hour  Intake      0 ml  Output     51 ml  Net    -51 ml   Filed Weights   04/03/14 1923 04/03/14 2328  Weight: 92.534 kg (204 lb) 93.033 kg (205 lb 1.6 oz)    Exam:   General:  Initially lethargic (status post 4 mg of morphine), but became alert and oriented. No acute distress.  Cardiovascular: S1, S2, with no murmurs rubs or gallops.  Respiratory: Coarse breath sounds, but no audible wheezes or crackles. Breathing is nonlabored.  Abdomen: Positive bowel sounds, soft, nontender, nondistended.  Musculoskeletal: No acute hot red joints. No pedal edema.  Neurologic: He becomes alert and oriented 3. Cranial nerves II through XII are grossly intact. Strength in the sitting position is 5 over 5 upper and lower extremities symmetrically. Sensation is grossly intact.  Data Reviewed: Basic Metabolic Panel:  Recent Labs Lab 04/03/14 2059 04/04/14 0141  NA 142 141  K 4.6 4.6  CL 105 105  CO2 26 26  GLUCOSE 95 159*  BUN 39* 37*  CREATININE 3.11* 2.95*  CALCIUM 8.9 8.5  MG 2.1   --    Liver Function Tests:  Recent Labs Lab 04/03/14 2059  AST 16  ALT 8  ALKPHOS 74  BILITOT 0.2*  PROT 6.6  ALBUMIN 3.1*   No results for input(s): LIPASE, AMYLASE in the last 168 hours.  Recent Labs Lab 04/03/14 2059  AMMONIA 24   CBC:  Recent Labs Lab 04/03/14 2059 04/04/14 0141  WBC 6.7 7.2  NEUTROABS 4.0  --   HGB 11.1* 10.6*  HCT 34.3* 32.2*  MCV 87.5 86.8  PLT 224 207   Cardiac Enzymes: No results for input(s): CKTOTAL, CKMB, CKMBINDEX, TROPONINI in the last 168 hours. BNP (last 3 results)  Recent Labs  05/30/13 2025 06/23/13 1602 06/24/13 0542  PROBNP 9807.0* 2103.0* 9222.0*   CBG:  Recent Labs Lab 04/03/14 2219 04/04/14 0729  GLUCAP 92 162*    Recent Results (from the past 240 hour(s))  Culture, blood (routine x 2) Call MD if unable to obtain prior to antibiotics being given     Status: None (Preliminary result)   Collection Time: 04/04/14  1:41 AM  Result Value Ref Range Status   Specimen Description BLOOD LEFT FOREARM  Final   Special Requests BOTTLES DRAWN AEROBIC AND ANAEROBIC 8CC EACH  Final   Culture NO GROWTH <24 HRS  Final   Report Status PENDING  Incomplete  Culture, blood (routine x 2) Call MD if unable to obtain prior to antibiotics being given     Status:  None (Preliminary result)   Collection Time: 04/04/14  1:41 AM  Result Value Ref Range Status   Specimen Description BLOOD RIGHT ANTECUBITAL  Final   Special Requests   Final    BOTTLES DRAWN AEROBIC AND ANAEROBIC AEB 10CC ANA [place]   Culture NO GROWTH <24 HRS  Final   Report Status PENDING  Incomplete     Studies: Ct Abdomen Pelvis Wo Contrast  04/03/2014   CLINICAL DATA:  Generalized pain after multiple falls.  EXAM: CT CHEST, ABDOMEN AND PELVIS WITHOUT CONTRAST  TECHNIQUE: Multidetector CT imaging of the chest, abdomen and pelvis was performed following the standard protocol without IV contrast.  COMPARISON:  CT scan of July 07, 2010.  FINDINGS: CT CHEST FINDINGS  No  pneumothorax or pleural effusion is noted. Multiple ill-defined densities with branching nodularity are noted in the right upper lobe most consistent with focal inflammation. Some associated peribronchial thickening is noted as well. No mediastinal adenopathy is noted. Mild coronary artery calcifications are noted. 5.6 x 2.3 cm mass is seen arising from the inferior portion of the left thyroid lobe. No significant osseous abnormality is noted in the chest. Spinal stimulator lead is seen in thoracic spinal canal.  CT ABDOMEN AND PELVIS FINDINGS  No gallstones are noted. No focal abnormality is noted in the liver, spleen or pancreas on these unenhanced images. Adrenal glands and kidneys appear normal. No hydronephrosis or renal obstruction is noted. No renal or ureteral calculi are noted. Stool is noted throughout the colon suggesting constipation. Urinary bladder appears normal. Atherosclerotic calcifications abdominal aorta are noted without aneurysm formation. No significant adenopathy is noted. No abnormal fluid collection is noted.  IMPRESSION: Multifocal ill-defined opacities are noted with branch shin nodularity in the right upper lobe most consistent with focal inflammation or pneumonia. Followup CT scan in 2-3 weeks is recommended to ensure resolution and rule out underlying nodules or mass.  5.6 x 2.3 cm mass is seen arising from the inferior portion of the left thyroid lobe. Thyroid ultrasound is recommended further evaluation.  Stool is noted throughout the colon suggesting constipation. No other significant abnormality is noted in the abdomen or pelvis.   Electronically Signed   By: Sabino Dick M.D.   On: 04/03/2014 21:04   Ct Head Wo Contrast  04/03/2014   CLINICAL DATA:  Multiple falls over the past 3 weeks, pain all over, prior neck surgery, history of seizures  EXAM: CT HEAD WITHOUT CONTRAST  CT CERVICAL SPINE WITHOUT CONTRAST  TECHNIQUE: Multidetector CT imaging of the head and cervical spine was  performed following the standard protocol without intravenous contrast. Multiplanar CT image reconstructions of the cervical spine were also generated.  COMPARISON:  01/23/2014  FINDINGS: CT HEAD FINDINGS  No evidence of parenchymal hemorrhage or extra-axial fluid collection. No mass lesion, mass effect, or midline shift.  No CT evidence of acute infarction.  Mild intracranial atherosclerosis.  Cerebral volume is within normal limits.  No ventriculomegaly.  The visualized paranasal sinuses are essentially clear. The mastoid air cells are unopacified.  No evidence of calvarial fracture.  CT CERVICAL SPINE FINDINGS  Straightening of the cervical spine.  Status post C4-7 ACDF with corpectomy at C5 and C6.  No evidence of fracture or dislocation. Vertebral body heights are maintained. Dens appears intact.  No prevertebral soft tissue swelling.  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  Visualized lung apices are notable for mild paraseptal emphysematous changes.  IMPRESSION: No evidence of acute intracranial abnormality. Mild  intracranial atherosclerosis.  No evidence of traumatic injury to the cervical spine. Stable postsurgical changes at C4-7.   Electronically Signed   By: Julian Hy M.D.   On: 04/03/2014 20:51   Ct Chest Wo Contrast  04/03/2014   CLINICAL DATA:  Generalized pain after multiple falls.  EXAM: CT CHEST, ABDOMEN AND PELVIS WITHOUT CONTRAST  TECHNIQUE: Multidetector CT imaging of the chest, abdomen and pelvis was performed following the standard protocol without IV contrast.  COMPARISON:  CT scan of July 07, 2010.  FINDINGS: CT CHEST FINDINGS  No pneumothorax or pleural effusion is noted. Multiple ill-defined densities with branching nodularity are noted in the right upper lobe most consistent with focal inflammation. Some associated peribronchial thickening is noted as well. No mediastinal adenopathy is noted. Mild coronary artery calcifications are noted. 5.6 x 2.3  cm mass is seen arising from the inferior portion of the left thyroid lobe. No significant osseous abnormality is noted in the chest. Spinal stimulator lead is seen in thoracic spinal canal.  CT ABDOMEN AND PELVIS FINDINGS  No gallstones are noted. No focal abnormality is noted in the liver, spleen or pancreas on these unenhanced images. Adrenal glands and kidneys appear normal. No hydronephrosis or renal obstruction is noted. No renal or ureteral calculi are noted. Stool is noted throughout the colon suggesting constipation. Urinary bladder appears normal. Atherosclerotic calcifications abdominal aorta are noted without aneurysm formation. No significant adenopathy is noted. No abnormal fluid collection is noted.  IMPRESSION: Multifocal ill-defined opacities are noted with branch shin nodularity in the right upper lobe most consistent with focal inflammation or pneumonia. Followup CT scan in 2-3 weeks is recommended to ensure resolution and rule out underlying nodules or mass.  5.6 x 2.3 cm mass is seen arising from the inferior portion of the left thyroid lobe. Thyroid ultrasound is recommended further evaluation.  Stool is noted throughout the colon suggesting constipation. No other significant abnormality is noted in the abdomen or pelvis.   Electronically Signed   By: Sabino Dick M.D.   On: 04/03/2014 21:04   Ct Cervical Spine Wo Contrast  04/03/2014   CLINICAL DATA:  Multiple falls over the past 3 weeks, pain all over, prior neck surgery, history of seizures  EXAM: CT HEAD WITHOUT CONTRAST  CT CERVICAL SPINE WITHOUT CONTRAST  TECHNIQUE: Multidetector CT imaging of the head and cervical spine was performed following the standard protocol without intravenous contrast. Multiplanar CT image reconstructions of the cervical spine were also generated.  COMPARISON:  01/23/2014  FINDINGS: CT HEAD FINDINGS  No evidence of parenchymal hemorrhage or extra-axial fluid collection. No mass lesion, mass effect, or midline  shift.  No CT evidence of acute infarction.  Mild intracranial atherosclerosis.  Cerebral volume is within normal limits.  No ventriculomegaly.  The visualized paranasal sinuses are essentially clear. The mastoid air cells are unopacified.  No evidence of calvarial fracture.  CT CERVICAL SPINE FINDINGS  Straightening of the cervical spine.  Status post C4-7 ACDF with corpectomy at C5 and C6.  No evidence of fracture or dislocation. Vertebral body heights are maintained. Dens appears intact.  No prevertebral soft tissue swelling.  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  Visualized lung apices are notable for mild paraseptal emphysematous changes.  IMPRESSION: No evidence of acute intracranial abnormality. Mild intracranial atherosclerosis.  No evidence of traumatic injury to the cervical spine. Stable postsurgical changes at C4-7.   Electronically Signed   By: Julian Hy M.D.   On:  04/03/2014 20:51    Scheduled Meds: . aspirin EC  81 mg Oral Daily  . atorvastatin  20 mg Oral q1800  . buPROPion  300 mg Oral Daily  . carvedilol  3.125 mg Oral BID  . ceFEPime (MAXIPIME) IV  1 g Intravenous Q12H  . FLUoxetine  80 mg Oral Daily  . gabapentin  300 mg Oral TID  . heparin  5,000 Units Subcutaneous 3 times per day  . insulin aspart  0-20 Units Subcutaneous TID WC  . insulin aspart  0-5 Units Subcutaneous QHS  . nicotine  14 mg Transdermal Daily  . pantoprazole  40 mg Oral Daily  . potassium chloride SA  20 mEq Oral Daily  . rOPINIRole  6 mg Oral QHS  . senna  2 tablet Oral BID  . vancomycin  1,250 mg Intravenous Q24H   Continuous Infusions: . sodium chloride 75 mL/hr at 04/04/14 0603    Principal Problem:   HCAP (healthcare-associated pneumonia) Active Problems:   Orthostatic hypotension   Hypotension   OSA on CPAP   Acute on chronic renal failure   Chronic combined systolic and diastolic congestive heart failure   Hyperlipidemia   DM type 2 with diabetic  peripheral neuropathy   Altered mental state   Chronic pain syndrome   Thyroid mass of unclear etiology    1. Healthcare associated pneumonia. The patient was treated in early November 2015 for healthcare associated pneumonia, apparently in [place]. His chest CT on admission reveals multifocal ill-defined opacities with nodularity in the right upper lobe, consistent with focal inflammation or pneumonia. He does not appear to be toxic. His white blood cell count is not elevated, nor does he have a fever. However, I am concerned about the possibility of aspiration or other infectious etiology. We'll continue vancomycin, but will discontinue cefepime and start Zosyn. Will consult pulmonology to see if additional diagnostic studies can be done to evaluate the findings on the CT. Blood cultures pending; sputum culture pending; strep pneumo and legionella antigen pending. HIV serology pending. We'll consult the speech therapist to evaluate for dysphagia.  Recent history of hypotension/orthostatic hypotension. Per review of his chart, his cardiologist, Dr. Doren Custard, found that the patient was hypotensive and orthostatic on 12/1, so Zaroxolyn, lisinopril, isosorbide nitrate, and carvedilol were discontinued. His blood pressure was in the 622W systolically on admission and has increased following IV fluid hydration since admission. Will continue carvedilol which was restarted. Will restart Lasix at a smaller dose tomorrow. We'll check orthostatic vital signs. We'll slightly decrease  IV fluids.  Acute kidney injury superimposed on stage III chronic kidney disease. Baseline creatinine 2.0-2.3. Likely secondary to hypoperfusion and/or volume depletion. Continue gentle IV fluids.  Obstructive sleep apnea/COPD, on BiPAP. The patient reports being on BiPAP instead of CPAP nightly for obstructive sleep apnea. He denies being on home oxygen. Oxygen saturation in the 90s on room air. We'll order BiPAP  nightly.  Chronic combined systolic and diastolic congestive heart failure. This appears to be compensated, no evidence of peripheral edema or pulmonary edema. Lasix is on hold due to hypotension/orthostatic hypotension on admission.  ACE inhibitor is on hold due to hypotension/orthostatic hypotension on admission. Blood pressure is better, so will continue Coreg.  We'll continue to hold ACE inhibitor due to acute on chronic renal insufficiency.  Diabetes mellitus with peripheral neuropathy. CBGs reasonable; will continue insulin as ordered and make adjustments as needed. Order hemoglobin A1c. Restart home dose of 70/30 upon discharge.  Altered mental status/reported delirium.  Currently resolved. The patient reports that being off of Klonopin may have affected his mentation. Klonopin will be restarted.  Chronic anxiety/depression. Will restart Klonopin at a lower dose (he was unable to get the prescription filled secondary to lack of money). We'll continue bupropion and fluoxetine.  Chronic pain syndrome. He is on fentanyl transdermal. This will be started at a lower dose.  Thyroid mass, seen on CT. We'll order an ultrasound of his thyroid gland, TSH, and free T4.  Normocytic anemia. We'll continue to follow.   Time spent: 40 minutes.    [facility] Pager [phone]. If 7PM-7AM, please contact night-coverage at [email or web address], password [facility] 04/04/2014, 10:23 AM  LOS: 1 day

## 2014-04-04 NOTE — Progress Notes (Signed)
UR chart review completed.  

## 2014-04-05 ENCOUNTER — Inpatient Hospital Stay (HOSPITAL_COMMUNITY): Payer: Medicare HMO

## 2014-04-05 DIAGNOSIS — I5042 Chronic combined systolic (congestive) and diastolic (congestive) heart failure: Secondary | ICD-10-CM

## 2014-04-05 LAB — GLUCOSE, CAPILLARY
Glucose-Capillary: 136 mg/dL — ABNORMAL HIGH (ref 70–99)
Glucose-Capillary: 102 mg/dL — ABNORMAL HIGH (ref 70–99)
Glucose-Capillary: 178 mg/dL — ABNORMAL HIGH (ref 70–99)
Glucose-Capillary: 178 mg/dL — ABNORMAL HIGH (ref 70–99)

## 2014-04-05 LAB — CBC
HCT: 32.5 % — ABNORMAL LOW (ref 39.0–52.0)
Hemoglobin: 10.5 g/dL — ABNORMAL LOW (ref 13.0–17.0)
MCH: 28.2 pg (ref 26.0–34.0)
MCHC: 32.3 g/dL (ref 30.0–36.0)
MCV: 87.4 fL (ref 78.0–100.0)
PLATELETS: 205 10*3/uL (ref 150–400)
RBC: 3.72 MIL/uL — ABNORMAL LOW (ref 4.22–5.81)
RDW: 15.3 % (ref 11.5–15.5)
WBC: 8.1 10*3/uL (ref 4.0–10.5)

## 2014-04-05 LAB — STREP PNEUMONIAE URINARY ANTIGEN: Strep Pneumo Urinary Antigen: NEGATIVE

## 2014-04-05 LAB — BASIC METABOLIC PANEL
ANION GAP: 10 (ref 5–15)
BUN: 26 mg/dL — ABNORMAL HIGH (ref 6–23)
CHLORIDE: 110 meq/L (ref 96–112)
CO2: 26 mEq/L (ref 19–32)
CREATININE: 2.19 mg/dL — AB (ref 0.50–1.35)
Calcium: 8.6 mg/dL (ref 8.4–10.5)
GFR calc non Af Amer: 31 mL/min — ABNORMAL LOW (ref >90)
GFR, EST AFRICAN AMERICAN: 36 mL/min — AB (ref >90)
Glucose, Bld: 139 mg/dL — ABNORMAL HIGH (ref 70–99)
Potassium: 4.4 mEq/L (ref 3.7–5.3)
Sodium: 146 mEq/L (ref 137–147)

## 2014-04-05 LAB — RAPID URINE DRUG SCREEN, HOSP PERFORMED
AMPHETAMINES: NOT DETECTED
BARBITURATES: NOT DETECTED
Benzodiazepines: NOT DETECTED
COCAINE: NOT DETECTED
Opiates: POSITIVE — AB
Tetrahydrocannabinol: NOT DETECTED

## 2014-04-05 MED ORDER — GABAPENTIN 100 MG PO CAPS
200.0000 mg | ORAL_CAPSULE | Freq: Three times a day (TID) | ORAL | Status: DC
  Administered 2014-04-05 – 2014-04-07 (×6): 200 mg via ORAL
  Filled 2014-04-05 (×6): qty 2

## 2014-04-05 MED ORDER — FENTANYL 75 MCG/HR TD PT72
75.0000 ug | MEDICATED_PATCH | TRANSDERMAL | Status: DC
  Administered 2014-04-05: 75 ug via TRANSDERMAL
  Filled 2014-04-05: qty 1

## 2014-04-05 MED ORDER — ROPINIROLE HCL 1 MG PO TABS
ORAL_TABLET | ORAL | Status: AC
  Filled 2014-04-05: qty 6

## 2014-04-05 MED ORDER — CLONAZEPAM 0.5 MG PO TABS
0.5000 mg | ORAL_TABLET | Freq: Every day | ORAL | Status: DC
  Administered 2014-04-06 – 2014-04-07 (×2): 0.5 mg via ORAL
  Filled 2014-04-05 (×2): qty 1

## 2014-04-05 MED ORDER — MORPHINE SULFATE 15 MG PO TABS
15.0000 mg | ORAL_TABLET | Freq: Two times a day (BID) | ORAL | Status: DC | PRN
  Administered 2014-04-05 – 2014-04-07 (×4): 15 mg via ORAL
  Filled 2014-04-05 (×4): qty 1

## 2014-04-05 MED ORDER — CLONAZEPAM 0.5 MG PO TABS
1.0000 mg | ORAL_TABLET | Freq: Every day | ORAL | Status: DC
  Administered 2014-04-05 – 2014-04-06 (×2): 1 mg via ORAL
  Filled 2014-04-05 (×2): qty 2

## 2014-04-05 NOTE — Evaluation (Signed)
Occupational Therapy Evaluation Patient Details Name: [name] MRN: [id number] DOB: 1955-09-05 Today's Date: 04/05/2014    History of Present Illness [name] is a 58 y.o. male porgressive weakness and falls over the past 3 wks. Also Reports 15-20 falls during this time. Falls are related to balance per pt., but endorses feeling lightheaded often.  Denies CP, palpitations, , HA, syncope. Reports feeling more and more confused for the past 3 days with difficulty finding words. Associated w/ intermittent shaking. Pt states that he's had the symptoms of confusion before when he was taken off his klonopin.   Clinical Impression   Pt is presenting to acute OT with above situation.  Pt has WFL strength, ROM, and coordination in BUE.  No deficits noted.  Pt reports being independent with all ADLs at home and demonstrated no deficits in skills assessed during evaluation.  When asked about pt's thoughts on his ability to continue his current level of independence after d/c, pt reported 'well, my wife says she can't keep taking care of me.'  Pt could not indicate what activities he requires assist from his wife for.  Pt also indicated that there is a possibility of he and his wife moving in with their daughter in the near future, due to financial concerns.  Pt also reported increased difficulty in caring for the home, but would not elaborate.  In acute care setting, pt is not presenting with any deficits to prevent his ADL or IADL engagement.  Pt needs no further acute OT services as this time - acute OT to sign off.    Follow Up Recommendations  No OT follow up    Equipment Recommendations  None recommended by OT    Recommendations for Other Services       Precautions / Restrictions Precautions Precautions: Fall Restrictions Weight Bearing Restrictions: No      Mobility Bed Mobility Overal bed mobility: Independent                Transfers Overall transfer level:  Independent                    Balance Overall balance assessment: No apparent balance deficits (not formally assessed);History of Falls                                          ADL Overall ADL's : At baseline;Modified independent                                       General ADL Comments: Pt demonstrated good skills with LE dressing, toileting, toilet hygiene, functional mobility, and grooming tasks while standing. Pt reported feeling at baseline.     Vision                     Perception     Praxis      Pertinent Vitals/Pain Pain Assessment: 0-10 Pain Score: 6  Pain Location: back Pain Intervention(s): Limited activity within patient's tolerance;Monitored during session;Repositioned     Hand Dominance Right   Extremity/Trunk Assessment Upper Extremity Assessment Upper Extremity Assessment: Overall WFL for tasks assessed   Lower Extremity Assessment Lower Extremity Assessment: Defer to PT evaluation       Communication Communication Communication: No difficulties   Cognition Arousal/Alertness:  Lethargic;Awake/alert (pt had imporved attention after OTR requested pt sit at EOB (rather than laying down).  ) Behavior During Therapy: WFL for tasks assessed/performed Overall Cognitive Status: Within Functional Limits for tasks assessed                     General Comments       Exercises       Shoulder Instructions      Home Living Family/patient expects to be discharged to:: Private residence Living Arrangements: Spouse/significant other Available Help at Discharge: Family Type of Home: Mobile home Home Access: Ramped entrance     Mobile: One level     Bathroom Shower/Tub: Occupational psychologist: [place]: Bedside commode;Walker - 4 wheels;Wheelchair - power;Cane - quad;Cane - single point;Shower seat;Grab bars - toilet;Grab bars - tub/shower;Wheelchair -  manual   Additional Comments: Pt reports that there may be plans for pt and wife to move in with daughter in [place], due to increasing financial and IADL difficulties.      Prior Functioning/Environment Level of Independence: Independent with assistive device(s)        Comments: pt reports being independnet with all ADLs.  Reports supervision level for shower transfers for safety (and provides the same supervision for his wife).  Pt indicated recent difficulty managing all household chores, but would not elaborate.     OT Diagnosis:     OT Problem List:     OT Treatment/Interventions:      OT Goals(Current goals can be found in the care plan section) Acute Rehab OT Goals Patient Stated Goal: No OT goals needed OT Goal Formulation: With patient  OT Frequency:     Barriers to D/C:            Co-evaluation              End of Session Equipment Utilized During Treatment: Gait belt  Activity Tolerance: Patient tolerated treatment well Patient left: in bed;with call bell/phone within reach;with bed alarm set   Time: 3838-1840 OT Time Calculation (min): 20 min Charges:  OT General Charges $OT Visit: 1 Procedure OT Evaluation $Initial OT Evaluation Tier I: 1 Procedure G-Codes:      Larry Graff Zaida Reiland, MS, OTR/L [place] [phone] 04/05/2014, 2:02 PM

## 2014-04-05 NOTE — Evaluation (Signed)
Clinical/Bedside Swallow Evaluation Patient Details  Name: [name] MRN: [id number] DOB: [birth year]  Today's Date: 04/05/2014 Time: 11:25 AM - 12:10 PM    Past Medical History:  Past Medical History  Diagnosis Date  . Type 2 diabetes mellitus   . Essential hypertension, benign   . 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  . CKD (chronic kidney disease) stage 3, GFR 30-59 ml/min   . Arthritis   . Obesity   . Depression   . Sleep apnea   . Diabetic neuropathy   . Coronary atherosclerosis of native coronary artery     Nonobstructive by cardiac catheterization September 2008  . Esophagitis, reflux 07/07/10    MW tear, erosive reflux esophagitis, 2cm hh  . S/P colonoscopy April 2012    Rourk: torturous colon, hyperplastic polyps  . Tremor   . Diverticulum   . Hiatal hernia   . Gastroparesis 11/2010    No emptying at two hours.   . Chronic back pain   . Clonic seizure     Controlled with klonodine  . Acute respiratory failure with hypoxia 06/23/2013    Due to opiate toxicity. Intubated & extubated  . COPD (chronic obstructive pulmonary disease)   . CHF (congestive heart failure)    Past Surgical History:  Past Surgical History  Procedure Laterality Date  . Neck surgery      Discectomy  . Hand surgery      Rght-carpal tunnel  . Colonoscopy  before 2002    [place]  . Appendectomy    . Other surgical history      Bone graft-knee  . Anterior lumbar corpectomy w/ fusion      C5-6/C4-7  . Colonoscopy  08/21/2010    Elongaed tortuous colon otherwise normal  . Esophagogastroduodenoscopy  07/08/10    Dr. Volney American esophageal erosions and excoriations consistent with erosive reflux esophagitis, hiatal hernia  . Knee arthroscopy      Right  . Esophagogastroduodenoscopy (egd) with propofol  02/25/2012    Procedure: ESOPHAGOGASTRODUODENOSCOPY (EGD) WITH PROPOFOL;  Surgeon: Daneil Dolin, MD;  Location: [facility];  Service:  Endoscopy;  Laterality: N/A;  7:30 /POLYPHARMACY   HPI:  [name] is a 58  y.o. male who presents to the Emergency Department complaining of multiple falls and gradually worsening, decreased cognition that started 3 weeks ago. His wife notes that he has fallen 15-20 times in the last 3 weeks. Pt states repeated falls that occur 1-5 times each day because of lack of balance. He states difficulty talking, multiple bruises and a few loose teeth as associated symptoms. Pt saw cardiologist for orthostatic hypotension and syncope last week. He was admitted to the hospital where they held blood pressure medication until resolved, including Lasix. His wife notes improvement of symptoms since seeing the cardiologist. Pt also complains that cognition has decreased without clonazepam use. He stopped refilling prescription because of financial reasons. His wife states "he loses cognition out of nowhere" which she describes as losing control of his mouth. She notes symptoms occur mostly at night, but sometimes in the middle of the day. Pt states he gets up frequently at night to use the bathroom and loses his way around the house. Pt has been wearing a Fentanyl patch for 1 year for chronic back pain, arthritis and neck pain. He has also lost over 300 lbs over the last 2 years because of loss of appetite. Pt's wife states that  they are going to live with his daughter and then enter an assisted living facility. SLP asked to evaluated swallow function to assess for possible aspiration.   Assessment/Recommendations/Treatment Plan Suspected Esophageal Findings Suspected Esophageal Findings:  (history of gastroparesis)  SLP Assessment Clinical Impression Statement: [name] shows no overt signs of symptom of aspiration at bedside with regular textures and thin liquids. Oral motor evaluation is WNL; pt with strong cough; seemingly adequate laryngeal elevation. Swallow initiation is prompt. Pt denies difficulty  swallowing, but states that occasionally he has trouble with "grainy pieces". He endorses taking his medication all at once with liquid. [name] dozed off in mid conversation a couple of times and verbalized irrelevant comments ("Oh, I didn't know you were off the phone"- no one on phone) at times. He also stated, "Can someone just give me some drugs to knock me out?". SLP inquired why and he stated that his back was about to really hurt. SLP offered repositioning in bed. Pt has had several bouts of PNA. I do not think he is aspirating during meals. He has a history of polypharmacy encephalopathy and gastroparesis; perhaps pt experiences post prandial aspiration. He was encouraged to only eat/drink when alert and upright and practice standard reflux precautions (elevate HOB, avoid laying down after meals). No further SLP services indicated at this time.  Risk for Aspiration: None (possibly post prandial aspiration?) Other Related Risk Factors: History of pneumonia (polypharmacy altered mental status changes at times)  Swallow Evaluation Recommendations Diet Recommendations: Regular, Thin liquid Liquid Administration via: Cup, Straw Medication Administration: Whole meds with liquid Supervision: Patient able to self feed Postural Changes and/or Swallow Maneuvers: Seated upright 90 degrees, Out of bed for meals, Upright 30-60 min after meal Oral Care Recommendations: Oral care BID Other Recommendations: Clarify dietary restrictions Follow up Recommendations: None  Treatment Plan Treatment Plan Recommendations: No treatment recommended at this time  Prognosis Prognosis for Safe Diet Advancement: Good Barriers to Reach Goals: Medication Barriers/Prognosis Comment: Pt with multiple hospitalizations  Individuals Consulted Consulted and Agree with Results and Recommendations: Patient, MD, RN    Swallow Study Prior Functional Status  Type of Home: Mobile home Available Help at Discharge:  Family  General  Date of Onset: 04/03/14 HPI: [name] is a 58  y.o. male who presents to the Emergency Department complaining of multiple falls and gradually worsening, decreased cognition that started 3 weeks ago. His wife notes that he has fallen 15-20 times in the last 3 weeks. Pt states repeated falls that occur 1-5 times each day because of lack of balance. He states difficulty talking, multiple bruises and a few loose teeth as associated symptoms. Pt saw cardiologist for orthostatic hypotension and syncope last week. He was admitted to the hospital where they held blood pressure medication until resolved, including Lasix. His wife notes improvement of symptoms since seeing the cardiologist. Pt also complains that cognition has decreased without clonazepam use. He stopped refilling prescription because of financial reasons. His wife states "he loses cognition out of nowhere" which she describes as losing control of his mouth. She notes symptoms occur mostly at night, but sometimes in the middle of the day. Pt states he gets up frequently at night to use the bathroom and loses his way around the house. Pt has been wearing a Fentanyl patch for 1 year for chronic back pain, arthritis and neck pain. He has also lost over 300 lbs over the last 2 years because of loss of appetite. Pt's wife  states that they are going to live with his daughter and then enter an assisted living facility. SLP asked to evaluated swallow function to assess for possible aspiration. Type of Study: Bedside swallow evaluation Previous Swallow Assessment: March 2015 Diet Prior to this Study: Regular, Thin liquids Temperature Spikes Noted: No Respiratory Status: Room air History of Recent Intubation: No Behavior/Cognition: Alert, Cooperative, Pleasant mood, Confused Oral Cavity - Dentition: Adequate natural dentition, Poor condition Self-Feeding Abilities: Able to feed self Patient Positioning: Upright in bed Baseline  Vocal Quality: Clear Volitional Cough: Strong Volitional Swallow: Able to elicit  Oral Motor/Sensory Function  Overall Oral Motor/Sensory Function: Appears within functional limits for tasks assessed Labial ROM: Within Functional Limits Labial Symmetry: Within Functional Limits Labial Strength: Within Functional Limits Labial Sensation: Within Functional Limits Lingual ROM: Within Functional Limits Lingual Symmetry: Within Functional Limits Lingual Strength: Within Functional Limits Lingual Sensation: Within Functional Limits Facial ROM: Within Functional Limits Facial Symmetry: Within Functional Limits Facial Strength: Within Functional Limits Facial Sensation: Within Functional Limits Velum: Within Functional Limits Mandible: Within Functional Limits  Consistency Results  Ice Chips Ice chips: Within functional limits Presentation: Spoon  Thin Liquid Thin Liquid: Within functional limits Presentation: Cup;Self Fed;Straw  Nectar Thick Liquid Nectar Thick Liquid: Not tested  Honey Thick Liquid Honey Thick Liquid: Not tested  Puree Puree: Within functional limits Presentation: Spoon;Self Fed  Solid Solid: Within functional limits Presentation: Self Fed  Thank you,  Genene Churn, [place]  Sasha Rogel 04/05/2014,2:17 PM

## 2014-04-05 NOTE — Evaluation (Signed)
Physical Therapy Evaluation Patient Details Name: [name] MRN: [id number] DOB: 04-Dec-1955 Today's Date: 04/05/2014   History of Present Illness  [name] is a 58 y.o. male porgressive weakness and falls over the past 3 wks. Also Reports 15-20 falls during this time. Falls are related to balance per pt., but endorses feeling lightheaded often.  Denies CP, palpitations, , HA, syncope. Reports feeling more and more confused for the past 3 days with difficulty finding words. Associated w/ intermittent shaking. Pt states that he's had the symptoms of confusion before when he was taken off his klonopin. he had an initial evalution by PT yesterday where no major functional problems were found. We are asked to do a reevaluation of the pt by pt's wife.  Clinical Impression  Pt was seen for reevaluation.  He was very alert, oriented and cooperative at the time of my visit.  He describes having intractable back pain (upper and lower) and this has significantly limited his overall mobility.  He usually gets about in a power w/c and uses a cane when walking.  His strength on manual muscle test was 4+/5 in the LEs with good muscle bulk.  He has normal coordination and has no neurologic deficit.  I reviewed basic body mechanics with him.  He was independent with transfers and surprisingly he did not display any antalgia with change of position.  He was able to ambulate 400' with a cane on level and sloped surfaces with no loss of balance.  He requested to return to bed due to severe back pain.  It was difficult to challenge him with high balance activities because of his back pain and with the stiffness of his posture he may have some subtle higher level balance deficiencies.  I also would recommend outpatient PT for work on balance if he should so choose.  His multiple falls are probably related to polypharmacy, though, rather than any balance deficit.    Follow Up Recommendations Outpatient PT     Equipment Recommendations  None recommended by PT    Recommendations for Other Services   none    Precautions / Restrictions Precautions Precautions: Fall Restrictions Weight Bearing Restrictions: No      Mobility  Bed Mobility Overal bed mobility: Independent                Transfers Overall transfer level: Independent                  Ambulation/Gait Ambulation/Gait assistance: Modified independent (Device/Increase time) Ambulation Distance (Feet): 400 Feet Assistive device: Straight cane Gait Pattern/deviations: Decreased dorsiflexion - right;Decreased dorsiflexion - left;Step-through pattern   Gait velocity interpretation: at or above normal speed for age/gender General Gait Details: pt tends to hold himself rigidly during gait and was advised to begin gentle rotation of the cervical spine while walking to help improve safety...he did ambulate on level and on a slope (up and down) with no loss of balance  Stairs            Wheelchair Mobility    Modified Rankin (Stroke Patients Only)       Balance Overall balance assessment: History of Falls Sitting-balance support: No upper extremity supported;Feet supported Sitting balance-Leahy Scale: Normal     Standing balance support: Single extremity supported Standing balance-Leahy Scale: Good Standing balance comment: no loss of balance during gait on level or slope with safe turns  Pertinent Vitals/Pain Pain Assessment: 0-10 Pain Score: 10-Worst pain ever Pain Location: entire back Pain Descriptors / Indicators: Aching Pain Intervention(s): Repositioned;Limited activity within patient's tolerance    Home Living Family/patient expects to be discharged to:: Private residence Living Arrangements: Spouse/significant other Available Help at Discharge: Family Type of Home: Mobile home Home Access: Ramped entrance     Home Layout: One level Home  Equipment: Bedside commode;Walker - 4 wheels;Wheelchair - power;Cane - quad;Cane - single point;Shower seat;Grab bars - toilet;Grab bars - tub/shower;Wheelchair - manual Additional Comments: Pt reports that there may be plans for pt and wife to move in with daughter in [place], due to increasing financial and IADL difficulties.    Prior Function Level of Independence: Independent with assistive device(s)         Comments: pt states the the majority of his mobility is via a power w/c due to his back pain but he does use a single tip cane when walking     Hand Dominance   Dominant Hand: Right    Extremity/Trunk Assessment   Upper Extremity Assessment: Overall WFL for tasks assessed           Lower Extremity Assessment: Overall WFL for tasks assessed      Cervical / Trunk Assessment: Other exceptions  Communication   Communication: No difficulties  Cognition Arousal/Alertness: Awake/alert Behavior During Therapy: WFL for tasks assessed/performed Overall Cognitive Status: Within Functional Limits for tasks assessed                      General Comments      Exercises        Assessment/Plan    PT Assessment All further PT needs can be met in the next venue of care  PT Diagnosis     PT Problem List Decreased balance (based on history of falls)  PT Treatment Interventions     PT Goals (Current goals can be found in the Care Plan section) Acute Rehab PT Goals Patient Stated Goal: No OT goals needed PT Goal Formulation: All assessment and education complete, DC therapy    Frequency     Barriers to discharge  none      Co-evaluation               End of Session Equipment Utilized During Treatment: Gait belt Activity Tolerance: Patient tolerated treatment well Patient left: in bed;with call bell/phone within reach           Time: 1421-1447 PT Time Calculation (min) (ACUTE ONLY): 26 min   Charges:   PT Evaluation $PT  Re-evaluation: 1 Procedure     PT G CodesOwens Shark, Tanique Matney L 04/05/2014, 3:08 PM

## 2014-04-05 NOTE — Procedures (Signed)
Objective Swallowing Evaluation: Modified Barium Swallowing Study  Patient Details  Name: [name] MRN: [id number] Date of Birth: Feb 07, 1956  Today's Date: 04/05/2014 Time: 1700-1732 SLP Time Calculation (min) (ACUTE ONLY): 32 min  Past Medical History:  Past Medical History  Diagnosis Date  . Type 2 diabetes mellitus   . Essential hypertension, benign   . 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  . CKD (chronic kidney disease) stage 3, GFR 30-59 ml/min   . Arthritis   . Obesity   . Depression   . Sleep apnea   . Diabetic neuropathy   . Coronary atherosclerosis of native coronary artery     Nonobstructive by cardiac catheterization September 2008  . Esophagitis, reflux 07/07/10    MW tear, erosive reflux esophagitis, 2cm hh  . S/P colonoscopy April 2012    Rourk: torturous colon, hyperplastic polyps  . Tremor   . Diverticulum   . Hiatal hernia   . Gastroparesis 11/2010    No emptying at two hours.   . Chronic back pain   . Clonic seizure     Controlled with klonodine  . Acute respiratory failure with hypoxia 06/23/2013    Due to opiate toxicity. Intubated & extubated  . COPD (chronic obstructive pulmonary disease)   . CHF (congestive heart failure)    Past Surgical History:  Past Surgical History  Procedure Laterality Date  . Neck surgery      Discectomy  . Hand surgery      Rght-carpal tunnel  . Colonoscopy  before 2002    [place]  . Appendectomy    . Other surgical history      Bone graft-knee  . Anterior lumbar corpectomy w/ fusion      C5-6/C4-7  . Colonoscopy  08/21/2010    Elongaed tortuous colon otherwise normal  . Esophagogastroduodenoscopy  07/08/10    Dr. Volney American esophageal erosions and excoriations consistent with erosive reflux esophagitis, hiatal hernia  . Knee arthroscopy      Right  . Esophagogastroduodenoscopy (egd) with propofol  02/25/2012    Procedure: ESOPHAGOGASTRODUODENOSCOPY (EGD)  WITH PROPOFOL;  Surgeon: Daneil Dolin, MD;  Location: [facility];  Service: Endoscopy;  Laterality: N/A;  7:30 /POLYPHARMACY   HPI:  Mr. [name] is a 58  y.o. male who presents to the Emergency Department complaining of multiple falls and gradually worsening, decreased cognition that started 3 weeks ago. His wife notes that he has fallen 15-20 times in the last 3 weeks. Pt states repeated falls that occur 1-5 times each day because of lack of balance. He states difficulty talking, multiple bruises and a few loose teeth as associated symptoms. Pt saw cardiologist for orthostatic hypotension and syncope last week. He was admitted to the hospital where they held blood pressure medication until resolved, including Lasix. His wife notes improvement of symptoms since seeing the cardiologist. Pt also complains that cognition has decreased without clonazepam use. He stopped refilling prescription because of financial reasons. His wife states "he loses cognition out of nowhere" which she describes as losing control of his mouth. She notes symptoms occur mostly at night, but sometimes in the middle of the day. Pt states he gets up frequently at night to use the bathroom and loses his way around the house. Pt has been wearing a Fentanyl patch for 1 year for chronic back pain, arthritis and neck pain. He has also lost over 300 lbs over the last 2 years  because of loss of appetite. Pt's wife states that they are going to live with his daughter and then enter an assisted living facility. SLP asked to evaluated swallow function to assess for possible aspiration.     Assessment / Plan / Recommendation Clinical Impression  Dysphagia Diagnosis: Within Functional Limits Clinical impression: SLP evaluated pt at bedside earlier today and found no signs/symptoms of aspiration, however pt's wife was insistant that he have a modified barium swallow study. Dr. Caryn Section requested MBSS. Pt assessed (and challenged) with puree, thin  cup and straw, barium tablet, and regular textures. Oropharyngeal swallow is essentially [facility]. Pt with mild puree residue in valleculae after initial swallow, which cleared with spontaneous secondary swallow. Trace residuals in lateral channels when drinking straw sips thin, which also clear with spontaneous secondary swallow. Pt instructed to swallow the barium tablet with thin liquid (placed in his hand), however he swallowed the pill without liquid and it dropped to the valleculae. Pt cued to drink liquid and pill cleared; no delay noted in esophageal transit of pill. During esophageal sweep, liquids were noted to remain in distal esophagus for ~20 seconds, but cleared. Recommend regular diet and thin liquids. Pt encouraged to only consume meals when alert and upright and to always take pills with a liquid to ensure pharyngeal and esophageal clearance. Swallow function essentially WNL.    Treatment Recommendation  No treatment recommended at this time    Diet Recommendation Regular;Thin liquid   Liquid Administration via: Cup;Straw Medication Administration: Whole meds with liquid Supervision: Patient able to self feed Postural Changes and/or Swallow Maneuvers: Seated upright 90 degrees;Out of bed for meals;Upright 30-60 min after meal    Other  Recommendations Oral Care Recommendations: Oral care BID Other Recommendations: Clarify dietary restrictions   Follow Up Recommendations  None    Frequency and Duration  N/A         General Date of Onset: 04/03/14 HPI: Mr. [name] is a 58  y.o. male who presents to the Emergency Department complaining of multiple falls and gradually worsening, decreased cognition that started 3 weeks ago. His wife notes that he has fallen 15-20 times in the last 3 weeks. Pt states repeated falls that occur 1-5 times each day because of lack of balance. He states difficulty talking, multiple bruises and a few loose teeth as associated symptoms. Pt saw  cardiologist for orthostatic hypotension and syncope last week. He was admitted to the hospital where they held blood pressure medication until resolved, including Lasix. His wife notes improvement of symptoms since seeing the cardiologist. Pt also complains that cognition has decreased without clonazepam use. He stopped refilling prescription because of financial reasons. His wife states "he loses cognition out of nowhere" which she describes as losing control of his mouth. She notes symptoms occur mostly at night, but sometimes in the middle of the day. Pt states he gets up frequently at night to use the bathroom and loses his way around the house. Pt has been wearing a Fentanyl patch for 1 year for chronic back pain, arthritis and neck pain. He has also lost over 300 lbs over the last 2 years because of loss of appetite. Pt's wife states that they are going to live with his daughter and then enter an assisted living facility. SLP asked to evaluated swallow function to assess for possible aspiration. Type of Study: Modified Barium Swallowing Study Reason for Referral: Objectively evaluate swallowing function Previous Swallow Assessment: BSE 04/05/2014 reg/thin no f/u; March 2015 BSE  reg/thin Diet Prior to this Study: Regular;Thin liquids Temperature Spikes Noted: No Respiratory Status: Room air History of Recent Intubation: No Behavior/Cognition: Alert;Cooperative;Pleasant mood;Confused Oral Cavity - Dentition: Adequate natural dentition;Poor condition Oral Motor / Sensory Function: Within functional limits Self-Feeding Abilities: Able to feed self Patient Positioning: Upright in chair Baseline Vocal Quality: Clear Volitional Cough: Strong Volitional Swallow: Able to elicit Anatomy: Within functional limits (evidence of c-spine surgery) Pharyngeal Secretions: Not observed secondary MBS    Reason for Referral Objectively evaluate swallowing function   Oral Phase Oral Preparation/Oral Phase Oral  Phase: WFL   Pharyngeal Phase Pharyngeal Phase Pharyngeal Phase: Impaired Pharyngeal - Thin Pharyngeal - Thin Cup: Within functional limits Pharyngeal - Thin Straw: Lateral channel residue (trace residuals in lateral channel clear with swallow) Pharyngeal - Solids Pharyngeal - Puree: Within functional limits;Pharyngeal residue - valleculae (mild residuals in valleculae clear with 2nd swallow) Pharyngeal - Regular: Within functional limits Pharyngeal - Pill:  (pt took without water initially and it dropped to valleculae)   Cervical Esophageal Phase Cervical Esophageal Phase: [facility]        Thank you,  Genene Churn, [place]  [place] 04/05/2014, 6:34 PM

## 2014-04-05 NOTE — Progress Notes (Signed)
Patient refusing the bed alarm.  Explained to patient the safety policy and that it is in place to keep him safe.  Patient still refusing.  Supervisors notified.  Will continue to monitor.

## 2014-04-05 NOTE — Progress Notes (Signed)
Patient stated this evening that he turns off the bed alarm when he wants to get out of bed.  Patient was educated on the use and importance of the bed alarm.  RN asked patient to leave the bed alarm activated to ensure patient safety.  RN left room with bed in lowest position and bed alarm on.

## 2014-04-05 NOTE — Consult Note (Signed)
Consult requested by: Dr. Caryn Section Consult requested for abnormal chest CT:  HPI: This is a 58 year old who came to the hospital because of altered mental status and multiple falls. He had been in the hospital at [facility] in September of this year and chest x-ray then showed similar infiltrate in the right upper lobe. He did not have CT so we cannot do a direct comparison. He does have COPD. So has sleep apnea. He has multiple other medical problems including chronic back pain and these had a nerve stimulator placed, nonischemic cardiomyopathy, hypertension, diabetes mellitus, diabetic neuropathy gastroparesis history of seizures and a previous episode of acute hypoxic respiratory failure requiring intubation and mechanical ventilation. This morning he says he feels okay but he still drowsy and doesn't feel back to normal. He says he coughs every morning. He says he is not short of breath right now. It should be noted that he is not using his sleep apnea here in the hospital because he doesn't like the mask. He's not had any definite fever.  Past Medical History  Diagnosis Date  . Type 2 diabetes mellitus   . Essential hypertension, benign   . 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  . CKD (chronic kidney disease) stage 3, GFR 30-59 ml/min   . Arthritis   . Obesity   . Depression   . Sleep apnea   . Diabetic neuropathy   . Coronary atherosclerosis of native coronary artery     Nonobstructive by cardiac catheterization September 2008  . Esophagitis, reflux 07/07/10    MW tear, erosive reflux esophagitis, 2cm hh  . S/P colonoscopy April 2012    Rourk: torturous colon, hyperplastic polyps  . Tremor   . Diverticulum   . Hiatal hernia   . Gastroparesis 11/2010    No emptying at two hours.   . Chronic back pain   . Clonic seizure     Controlled with klonodine  . Acute respiratory failure with hypoxia 06/23/2013    Due  to opiate toxicity. Intubated & extubated  . COPD (chronic obstructive pulmonary disease)   . CHF (congestive heart failure)      Family History  Problem Relation Age of Onset  . Stroke Father 23  . Emphysema Father     smoked  . Stroke Mother 6  . Colon cancer Neg Hx   . Crohn's disease Neg Hx   . Cirrhosis Neg Hx   . Ulcerative colitis Neg Hx   . Stomach cancer Neg Hx      History   Social History  . Marital Status: Married    Spouse Name: N/A    Number of Children: 1  . Years of Education: N/A   Occupational History  . Disabled   .     Social History Main Topics  . Smoking status: Current Every Day Smoker -- 0.25 packs/day for 45 years    Types: Cigarettes  . Smokeless tobacco: Never Used     Comment: currently smoking 3 cigs per day and also uses VAPE 01/03/14  . Alcohol Use: No  . Drug Use: No     Comment: Few times year  . Sexual Activity: Yes     Comment: erectile dysfunction   Other Topics Concern  . None   Social History Narrative   Lost one dgt due to homicide.     ROS: He denies chest pain. He has been coughing. His cough  has been nonproductive. He is unaware of any fever or chills. The rest per the history and physical    Objective: Vital signs in last 24 hours: Temp:  [97.9 F (36.6 C)-98.4 F (36.9 C)] 98 F (36.7 C) (12/10 0514) Pulse Rate:  [66-77] 66 (12/10 0514) Resp:  [18-20] 20 (12/10 0514) BP: (133-144)/(65-79) 134/69 mmHg (12/10 0514) SpO2:  [95 %-98 %] 98 % (12/10 0514) Weight change:  Last BM Date: 04/03/14  Intake/Output from previous day: 12/09 0701 - 12/10 0700 In: 1391 [P.O.:480; I.V.:861; IV Piggyback:50] Out: 2200 [Urine:2200]  PHYSICAL EXAM He is sluggish but responsive. His pupils reactand throat are clear. Neck is supple without masses. His chest shows fairly clear with some rhonchi bilaterally. His heart is regular without gallop. Abdomen is soft no masses are felt. Extremities showed no clubbing or cyanosis in  his central nervous system exam is grossly intact  Lab Results: Basic Metabolic Panel:  Recent Labs  04/03/14 2059 04/04/14 0141 04/05/14 0557  NA 142 141 146  K 4.6 4.6 4.4  CL 105 105 110  CO2 26 26 26   GLUCOSE 95 159* 139*  BUN 39* 37* 26*  CREATININE 3.11* 2.95* 2.19*  CALCIUM 8.9 8.5 8.6  MG 2.1  --   --    Liver Function Tests:  Recent Labs  04/03/14 2059  AST 16  ALT 8  ALKPHOS 74  BILITOT 0.2*  PROT 6.6  ALBUMIN 3.1*   No results for input(s): LIPASE, AMYLASE in the last 72 hours.  Recent Labs  04/03/14 2059  AMMONIA 24   CBC:  Recent Labs  04/03/14 2059 04/04/14 0141 04/05/14 0557  WBC 6.7 7.2 8.1  NEUTROABS 4.0  --   --   HGB 11.1* 10.6* 10.5*  HCT 34.3* 32.2* 32.5*  MCV 87.5 86.8 87.4  PLT 224 207 205   Cardiac Enzymes: No results for input(s): CKTOTAL, CKMB, CKMBINDEX, TROPONINI in the last 72 hours. BNP: No results for input(s): PROBNP in the last 72 hours. D-Dimer: No results for input(s): DDIMER in the last 72 hours. CBG:  Recent Labs  04/03/14 2219 04/04/14 0729 04/04/14 1200 04/04/14 1706 04/04/14 2102 04/05/14 0755  GLUCAP 92 162* 76 161* 185* 136*   Hemoglobin A1C:  Recent Labs  04/04/14 0141  HGBA1C 9.1*   Fasting Lipid Panel: No results for input(s): CHOL, HDL, LDLCALC, TRIG, CHOLHDL, LDLDIRECT in the last 72 hours. Thyroid Function Tests:  Recent Labs  04/04/14 0901  TSH 0.531  FREET4 1.07   Anemia Panel: No results for input(s): VITAMINB12, FOLATE, FERRITIN, TIBC, IRON, RETICCTPCT in the last 72 hours. Coagulation: No results for input(s): LABPROT, INR in the last 72 hours. Urine Drug Screen: Drugs of Abuse     Component Value Date/Time   LABOPIA POSITIVE* 04/05/2014 0512   COCAINSCRNUR NONE DETECTED 04/05/2014 0512   LABBENZ NONE DETECTED 04/05/2014 0512   AMPHETMU NONE DETECTED 04/05/2014 0512   THCU NONE DETECTED 04/05/2014 0512   LABBARB NONE DETECTED 04/05/2014 0512    Alcohol  Level:  Recent Labs  04/04/14 0141  ETH <11   Urinalysis:  Recent Labs  04/03/14 2209  COLORURINE YELLOW  LABSPEC 1.010  PHURINE 6.0  GLUCOSEU NEGATIVE  HGBUR NEGATIVE  BILIRUBINUR NEGATIVE  KETONESUR NEGATIVE  PROTEINUR NEGATIVE  UROBILINOGEN 0.2  NITRITE NEGATIVE  LEUKOCYTESUR NEGATIVE   Misc. Labs:   ABGS: No results for input(s): PHART, PO2ART, TCO2, HCO3 in the last 72 hours.  Invalid input(s): PCO2   MICROBIOLOGY: Recent Results (  from the past 240 hour(s))  Culture, blood (routine x 2) Call MD if unable to obtain prior to antibiotics being given     Status: None (Preliminary result)   Collection Time: 04/04/14  1:41 AM  Result Value Ref Range Status   Specimen Description BLOOD LEFT FOREARM  Final   Special Requests BOTTLES DRAWN AEROBIC AND ANAEROBIC 8CC EACH  Final   Culture NO GROWTH <24 HRS  Final   Report Status PENDING  Incomplete  Culture, blood (routine x 2) Call MD if unable to obtain prior to antibiotics being given     Status: None (Preliminary result)   Collection Time: 04/04/14  1:41 AM  Result Value Ref Range Status   Specimen Description BLOOD RIGHT ANTECUBITAL  Final   Special Requests   Final    BOTTLES DRAWN AEROBIC AND ANAEROBIC AEB 10CC ANA [place]   Culture NO GROWTH <24 HRS  Final   Report Status PENDING  Incomplete    Studies/Results: Ct Abdomen Pelvis Wo Contrast  04/03/2014   CLINICAL DATA:  Generalized pain after multiple falls.  EXAM: CT CHEST, ABDOMEN AND PELVIS WITHOUT CONTRAST  TECHNIQUE: Multidetector CT imaging of the chest, abdomen and pelvis was performed following the standard protocol without IV contrast.  COMPARISON:  CT scan of July 07, 2010.  FINDINGS: CT CHEST FINDINGS  No pneumothorax or pleural effusion is noted. Multiple ill-defined densities with branching nodularity are noted in the right upper lobe most consistent with focal inflammation. Some associated peribronchial thickening is noted as well. No mediastinal  adenopathy is noted. Mild coronary artery calcifications are noted. 5.6 x 2.3 cm mass is seen arising from the inferior portion of the left thyroid lobe. No significant osseous abnormality is noted in the chest. Spinal stimulator lead is seen in thoracic spinal canal.  CT ABDOMEN AND PELVIS FINDINGS  No gallstones are noted. No focal abnormality is noted in the liver, spleen or pancreas on these unenhanced images. Adrenal glands and kidneys appear normal. No hydronephrosis or renal obstruction is noted. No renal or ureteral calculi are noted. Stool is noted throughout the colon suggesting constipation. Urinary bladder appears normal. Atherosclerotic calcifications abdominal aorta are noted without aneurysm formation. No significant adenopathy is noted. No abnormal fluid collection is noted.  IMPRESSION: Multifocal ill-defined opacities are noted with branch shin nodularity in the right upper lobe most consistent with focal inflammation or pneumonia. Followup CT scan in 2-3 weeks is recommended to ensure resolution and rule out underlying nodules or mass.  5.6 x 2.3 cm mass is seen arising from the inferior portion of the left thyroid lobe. Thyroid ultrasound is recommended further evaluation.  Stool is noted throughout the colon suggesting constipation. No other significant abnormality is noted in the abdomen or pelvis.   Electronically Signed   By: Sabino Dick M.D.   On: 04/03/2014 21:04   Ct Head Wo Contrast  04/03/2014   CLINICAL DATA:  Multiple falls over the past 3 weeks, pain all over, prior neck surgery, history of seizures  EXAM: CT HEAD WITHOUT CONTRAST  CT CERVICAL SPINE WITHOUT CONTRAST  TECHNIQUE: Multidetector CT imaging of the head and cervical spine was performed following the standard protocol without intravenous contrast. Multiplanar CT image reconstructions of the cervical spine were also generated.  COMPARISON:  01/23/2014  FINDINGS: CT HEAD FINDINGS  No evidence of parenchymal hemorrhage or  extra-axial fluid collection. No mass lesion, mass effect, or midline shift.  No CT evidence of acute infarction.  Mild intracranial atherosclerosis.  Cerebral volume is within normal limits.  No ventriculomegaly.  The visualized paranasal sinuses are essentially clear. The mastoid air cells are unopacified.  No evidence of calvarial fracture.  CT CERVICAL SPINE FINDINGS  Straightening of the cervical spine.  Status post C4-7 ACDF with corpectomy at C5 and C6.  No evidence of fracture or dislocation. Vertebral body heights are maintained. Dens appears intact.  No prevertebral soft tissue swelling.  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  Visualized lung apices are notable for mild paraseptal emphysematous changes.  IMPRESSION: No evidence of acute intracranial abnormality. Mild intracranial atherosclerosis.  No evidence of traumatic injury to the cervical spine. Stable postsurgical changes at C4-7.   Electronically Signed   By: Julian Hy M.D.   On: 04/03/2014 20:51   Ct Chest Wo Contrast  04/03/2014   CLINICAL DATA:  Generalized pain after multiple falls.  EXAM: CT CHEST, ABDOMEN AND PELVIS WITHOUT CONTRAST  TECHNIQUE: Multidetector CT imaging of the chest, abdomen and pelvis was performed following the standard protocol without IV contrast.  COMPARISON:  CT scan of July 07, 2010.  FINDINGS: CT CHEST FINDINGS  No pneumothorax or pleural effusion is noted. Multiple ill-defined densities with branching nodularity are noted in the right upper lobe most consistent with focal inflammation. Some associated peribronchial thickening is noted as well. No mediastinal adenopathy is noted. Mild coronary artery calcifications are noted. 5.6 x 2.3 cm mass is seen arising from the inferior portion of the left thyroid lobe. No significant osseous abnormality is noted in the chest. Spinal stimulator lead is seen in thoracic spinal canal.  CT ABDOMEN AND PELVIS FINDINGS  No gallstones are  noted. No focal abnormality is noted in the liver, spleen or pancreas on these unenhanced images. Adrenal glands and kidneys appear normal. No hydronephrosis or renal obstruction is noted. No renal or ureteral calculi are noted. Stool is noted throughout the colon suggesting constipation. Urinary bladder appears normal. Atherosclerotic calcifications abdominal aorta are noted without aneurysm formation. No significant adenopathy is noted. No abnormal fluid collection is noted.  IMPRESSION: Multifocal ill-defined opacities are noted with branch shin nodularity in the right upper lobe most consistent with focal inflammation or pneumonia. Followup CT scan in 2-3 weeks is recommended to ensure resolution and rule out underlying nodules or mass.  5.6 x 2.3 cm mass is seen arising from the inferior portion of the left thyroid lobe. Thyroid ultrasound is recommended further evaluation.  Stool is noted throughout the colon suggesting constipation. No other significant abnormality is noted in the abdomen or pelvis.   Electronically Signed   By: Sabino Dick M.D.   On: 04/03/2014 21:04   Ct Cervical Spine Wo Contrast  04/03/2014   CLINICAL DATA:  Multiple falls over the past 3 weeks, pain all over, prior neck surgery, history of seizures  EXAM: CT HEAD WITHOUT CONTRAST  CT CERVICAL SPINE WITHOUT CONTRAST  TECHNIQUE: Multidetector CT imaging of the head and cervical spine was performed following the standard protocol without intravenous contrast. Multiplanar CT image reconstructions of the cervical spine were also generated.  COMPARISON:  01/23/2014  FINDINGS: CT HEAD FINDINGS  No evidence of parenchymal hemorrhage or extra-axial fluid collection. No mass lesion, mass effect, or midline shift.  No CT evidence of acute infarction.  Mild intracranial atherosclerosis.  Cerebral volume is within normal limits.  No ventriculomegaly.  The visualized paranasal sinuses are essentially clear. The mastoid air cells are unopacified.   No evidence of calvarial fracture.  CT  CERVICAL SPINE FINDINGS  Straightening of the cervical spine.  Status post C4-7 ACDF with corpectomy at C5 and C6.  No evidence of fracture or dislocation. Vertebral body heights are maintained. Dens appears intact.  No prevertebral soft tissue swelling.  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  Visualized lung apices are notable for mild paraseptal emphysematous changes.  IMPRESSION: No evidence of acute intracranial abnormality. Mild intracranial atherosclerosis.  No evidence of traumatic injury to the cervical spine. Stable postsurgical changes at C4-7.   Electronically Signed   By: Julian Hy M.D.   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 Previous FNA biopsy of inferior left thyroid mass 09/10/2009.  EXAM: THYROID ULTRASOUND  TECHNIQUE: Ultrasound examination of the thyroid gland and adjacent soft tissues was performed.  COMPARISON:  CT 04/03/2014 and earlier studies  FINDINGS: Right thyroid lobe  Measurements: 45 x 20 x 15 mm.  12 x 8 mm cysts, upper pole.  Left thyroid lobe  Measurements: 84 x 56 x 34 mm. Marked heterogeneity. A large (at least 5.2 cm) slightly hypoechoic solid mass extends inferiorly, incompletely visualized.  Isthmus  Thickness: 11 mm.  No nodules visualized.  Lymphadenopathy  None visualized.  IMPRESSION: 1. Thyromegaly with dominant left inferior pole mass. Correlate with previous biopsy results.   Electronically Signed   By: Arne Cleveland M.D.   On: 04/04/2014 15:46    Medications:  Prior to Admission:  Prescriptions prior to admission  Medication Sig Dispense Refill Last Dose  . aspirin EC 81 MG tablet Take 81 mg by mouth daily.   04/03/2014 at Unknown time  . atorvastatin (LIPITOR) 20 MG tablet Take 1 tablet (20 mg total) by mouth daily. 90 tablet 3 04/03/2014 at Unknown time  . buPROPion (WELLBUTRIN XL) 300 MG 24 hr  tablet Take 1 tablet (300 mg total) by mouth daily.   04/03/2014 at Unknown time  . carvedilol (COREG) 3.125 MG tablet Take 3.125 mg by mouth 2 (two) times daily.  3 Past Month at Unknown time  . clonazePAM (KLONOPIN) 0.5 MG tablet Take 1 tablet (0.5 mg total) by mouth 2 (two) times daily as needed for anxiety. (Patient taking differently: Take 0.5-1 mg by mouth 2 (two) times daily as needed for anxiety (One in the morning and two at bedtime). ) 30 tablet 0 04/03/2014 at Unknown time  . FLUoxetine (PROZAC) 20 MG capsule Take 80 mg by mouth daily.   04/03/2014 at Unknown time  . gabapentin (NEURONTIN) 300 MG capsule Take 300 mg by mouth 3 (three) times daily. Takes 600 mg at bedtime   04/03/2014 at Unknown time  . insulin aspart protamine-insulin aspart (NOVOLOG 70/30) (70-30) 100 UNIT/ML injection Inject 10-20 Units into the skin 2 (two) times daily with a meal. Units vary based on food consumed.   04/03/2014 at Unknown time  . Melatonin 5 MG TBDP Take 5 mg by mouth at bedtime as needed (for sleep).    Past Week at Unknown time  . morphine (MSIR) 15 MG tablet Take 15 mg by mouth every 6 (six) hours as needed for severe pain.   04/03/2014 at Unknown time  . Multiple Vitamins-Minerals (CENTRUM SILVER ULTRA MENS) TABS Take 1 tablet by mouth daily.     04/03/2014 at Unknown time  . nitroGLYCERIN (NITROSTAT) 0.4 MG SL tablet Place 1 tablet (0.4 mg total) under the tongue every 5 (five) minutes as  needed for chest pain. 90 tablet 0 Past Week at Unknown time  . ondansetron (ZOFRAN-ODT) 4 MG disintegrating tablet Take 1 tablet (4 mg total) by mouth every 4 (four) hours as needed for nausea. (Patient taking differently: Take 8 mg by mouth 2 (two) times daily. ) 20 tablet 0 04/03/2014 at Unknown time  . pantoprazole (PROTONIX) 40 MG tablet Take 1 tablet (40 mg total) by mouth daily. (Patient taking differently: Take 40 mg by mouth daily. Patient takes differently than prescribed. He only takes prn) 90 tablet 3 04/02/2014 at  Unknown time  . polyethylene glycol (MIRALAX / GLYCOLAX) packet Take 17 g by mouth daily. For constipation   04/03/2014 at Unknown time  . potassium chloride SA (K-DUR,KLOR-CON) 20 MEQ tablet Take 1 tablet (20 mEq total) by mouth daily. 90 tablet 3 04/02/2014 at Unknown time  . rOPINIRole (REQUIP) 3 MG tablet Take 6 mg by mouth at bedtime.   04/02/2014 at Unknown time  . senna (SENOKOT) 8.6 MG TABS tablet Take 2 tablets by mouth 2 (two) times daily.   04/03/2014 at Unknown time  . traZODone (DESYREL) 100 MG tablet Take 100 mg by mouth at bedtime as needed for sleep.    Past Week at Unknown time  . furosemide (LASIX) 40 MG tablet Take 1 tablet (40 mg total) by mouth daily as needed. 30 tablet 6 unknown   Scheduled: . aspirin EC  81 mg Oral Daily  . atorvastatin  20 mg Oral q1800  . buPROPion  300 mg Oral Daily  . carvedilol  3.125 mg Oral BID  . clonazePAM  0.5 mg Oral BID  . FLUoxetine  80 mg Oral Daily  . furosemide  20 mg Oral BID  . gabapentin  300 mg Oral TID  . heparin  5,000 Units Subcutaneous 3 times per day  . insulin aspart  0-20 Units Subcutaneous TID WC  . insulin aspart  0-5 Units Subcutaneous QHS  . nicotine  14 mg Transdermal Daily  . pantoprazole  40 mg Oral Daily  . piperacillin-tazobactam (ZOSYN)  IV  3.375 g Intravenous Q8H  . potassium chloride SA  20 mEq Oral Daily  . rOPINIRole  6 mg Oral QHS  . senna  2 tablet Oral BID  . vancomycin  1,250 mg Intravenous Q24H   Continuous: . sodium chloride 70 mL/hr at 04/05/14 5397   [id number] **OR** acetaminophen, morphine, ondansetron (ZOFRAN) IV, traZODone  Assesment: He is being treated for healthcare associated pneumonia and I think that's appropriate. He does have similar-looking infiltrates to his previous chest x-ray done at [facility] but he did not have a CT that and so it's hard to make a direct comparison. He has an abnormality of his thyroid and I agree probably a ultrasound of that.  Because of his abnormalities with his mental status I agree is reasonable to have him get checked out by speech therapy. I would continue antibiotics I agree he needs to have a repeat CT fairly quickly and then if that doesn't show clearing or at least improvement he may require bronchoscopy etc. Principal Problem:   HCAP (healthcare-associated pneumonia) Active Problems:   Hyperlipidemia   OSA on CPAP   DM type 2 with diabetic peripheral neuropathy   Altered mental state   Acute on chronic renal failure   Chronic pain syndrome   Orthostatic hypotension   Hypotension   Thyroid mass of unclear etiology   Chronic combined systolic and diastolic congestive heart failure  Plan: As above. Thanks for allowing me to see him with you    LOS: 2 days   Jayra Choyce L 04/05/2014, 8:29 AM

## 2014-04-05 NOTE — Progress Notes (Signed)
[facility] PROGRESS NOTE  CRUZE ZINGARO WNU:[id number] DOB: 1956/03/16 DOA: 04/03/2014 PCP: Family physician is NOT Dr. Gerarda Fraction    Code Status: Full code Family Communication: Discussed with patient and wife. Disposition Plan: Discharge when clinically appropriate   Consultants:  Pulmonology  Procedures:  None  Antibiotics:  Vancomycin 12/8>>  Zosyn 12/9>>  Cefepime 12/8>>12/9  HPI/Subjective: The patient is sitting up in the bed, nodding off, but becomes alert. He is oriented to himself and hospital. He has no complaints other than chronic neck and low back pain.  Objective: Filed Vitals:   04/05/14 1501  BP: 161/88  Pulse: 70  Temp: 98.3 F (36.8 C)  Resp: 20   oxygen saturation 97% on room air.  Intake/Output Summary (Last 24 hours) at 04/05/14 1646 Last data filed at 04/05/14 1317  Gross per 24 hour  Intake   1151 ml  Output   2228 ml  Net  -1077 ml   Filed Weights   04/03/14 1923 04/03/14 2328  Weight: 92.534 kg (204 lb) 93.033 kg (205 lb 1.6 oz)    Exam:   General:  Patient was nodding off to sleep, but became alert and oriented rather quickly.  Cardiovascular: S1, S2, with no murmurs rubs or gallops.  Respiratory: Coarse breath sounds, but no audible wheezes or crackles. Breathing is nonlabored.  Abdomen: Positive bowel sounds, soft, nontender, nondistended.  Musculoskeletal: No acute hot red joints. No pedal edema.  Neurologic: He was initially sitting up in the bed sleeping, but when prompted, became much more alert and oriented. Cranial nerves II through XII are grossly intact.  Data Reviewed: Basic Metabolic Panel:  Recent Labs Lab 04/03/14 2059 04/04/14 0141 04/05/14 0557  NA 142 141 146  K 4.6 4.6 4.4  CL 105 105 110  CO2 26 26 26   GLUCOSE 95 159* 139*  BUN 39* 37* 26*  CREATININE 3.11* 2.95* 2.19*  CALCIUM 8.9 8.5 8.6  MG 2.1  --   --    Liver Function Tests:  Recent Labs Lab 04/03/14 2059  AST 16  ALT 8   ALKPHOS 74  BILITOT 0.2*  PROT 6.6  ALBUMIN 3.1*   No results for input(s): LIPASE, AMYLASE in the last 168 hours.  Recent Labs Lab 04/03/14 2059  AMMONIA 24   CBC:  Recent Labs Lab 04/03/14 2059 04/04/14 0141 04/05/14 0557  WBC 6.7 7.2 8.1  NEUTROABS 4.0  --   --   HGB 11.1* 10.6* 10.5*  HCT 34.3* 32.2* 32.5*  MCV 87.5 86.8 87.4  PLT 224 207 205   Cardiac Enzymes: No results for input(s): CKTOTAL, CKMB, CKMBINDEX, TROPONINI in the last 168 hours. BNP (last 3 results)  Recent Labs  05/30/13 2025 06/23/13 1602 06/24/13 0542  PROBNP 9807.0* 2103.0* 9222.0*   CBG:  Recent Labs Lab 04/04/14 1200 04/04/14 1706 04/04/14 2102 04/05/14 0755 04/05/14 1153  GLUCAP 76 161* 185* 136* 102*    Recent Results (from the past 240 hour(s))  Culture, blood (routine x 2) Call MD if unable to obtain prior to antibiotics being given     Status: None (Preliminary result)   Collection Time: 04/04/14  1:41 AM  Result Value Ref Range Status   Specimen Description BLOOD LEFT FOREARM  Final   Special Requests BOTTLES DRAWN AEROBIC AND ANAEROBIC 8CC EACH  Final   Culture NO GROWTH 1 DAY  Final   Report Status PENDING  Incomplete  Culture, blood (routine x 2) Call MD if unable to obtain prior to  antibiotics being given     Status: None (Preliminary result)   Collection Time: 04/04/14  1:41 AM  Result Value Ref Range Status   Specimen Description BLOOD RIGHT ANTECUBITAL  Final   Special Requests   Final    BOTTLES DRAWN AEROBIC AND ANAEROBIC AEB=10CC ANA=7CC   Culture NO GROWTH 1 DAY  Final   Report Status PENDING  Incomplete     Studies: Ct Abdomen Pelvis Wo Contrast  04/03/2014   CLINICAL DATA:  Generalized pain after multiple falls.  EXAM: CT CHEST, ABDOMEN AND PELVIS WITHOUT CONTRAST  TECHNIQUE: Multidetector CT imaging of the chest, abdomen and pelvis was performed following the standard protocol without IV contrast.  COMPARISON:  CT scan of July 07, 2010.  FINDINGS: CT  CHEST FINDINGS  No pneumothorax or pleural effusion is noted. Multiple ill-defined densities with branching nodularity are noted in the right upper lobe most consistent with focal inflammation. Some associated peribronchial thickening is noted as well. No mediastinal adenopathy is noted. Mild coronary artery calcifications are noted. 5.6 x 2.3 cm mass is seen arising from the inferior portion of the left thyroid lobe. No significant osseous abnormality is noted in the chest. Spinal stimulator lead is seen in thoracic spinal canal.  CT ABDOMEN AND PELVIS FINDINGS  No gallstones are noted. No focal abnormality is noted in the liver, spleen or pancreas on these unenhanced images. Adrenal glands and kidneys appear normal. No hydronephrosis or renal obstruction is noted. No renal or ureteral calculi are noted. Stool is noted throughout the colon suggesting constipation. Urinary bladder appears normal. Atherosclerotic calcifications abdominal aorta are noted without aneurysm formation. No significant adenopathy is noted. No abnormal fluid collection is noted.  IMPRESSION: Multifocal ill-defined opacities are noted with branch shin nodularity in the right upper lobe most consistent with focal inflammation or pneumonia. Followup CT scan in 2-3 weeks is recommended to ensure resolution and rule out underlying nodules or mass.  5.6 x 2.3 cm mass is seen arising from the inferior portion of the left thyroid lobe. Thyroid ultrasound is recommended further evaluation.  Stool is noted throughout the colon suggesting constipation. No other significant abnormality is noted in the abdomen or pelvis.   Electronically Signed   By: Sabino Dick M.D.   On: 04/03/2014 21:04   Ct Head Wo Contrast  04/03/2014   CLINICAL DATA:  Multiple falls over the past 3 weeks, pain all over, prior neck surgery, history of seizures  EXAM: CT HEAD WITHOUT CONTRAST  CT CERVICAL SPINE WITHOUT CONTRAST  TECHNIQUE: Multidetector CT imaging of the head and  cervical spine was performed following the standard protocol without intravenous contrast. Multiplanar CT image reconstructions of the cervical spine were also generated.  COMPARISON:  01/23/2014  FINDINGS: CT HEAD FINDINGS  No evidence of parenchymal hemorrhage or extra-axial fluid collection. No mass lesion, mass effect, or midline shift.  No CT evidence of acute infarction.  Mild intracranial atherosclerosis.  Cerebral volume is within normal limits.  No ventriculomegaly.  The visualized paranasal sinuses are essentially clear. The mastoid air cells are unopacified.  No evidence of calvarial fracture.  CT CERVICAL SPINE FINDINGS  Straightening of the cervical spine.  Status post C4-7 ACDF with corpectomy at C5 and C6.  No evidence of fracture or dislocation. Vertebral body heights are maintained. Dens appears intact.  No prevertebral soft tissue swelling.  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  Visualized lung apices are notable for mild paraseptal emphysematous changes.  IMPRESSION: No  evidence of acute intracranial abnormality. Mild intracranial atherosclerosis.  No evidence of traumatic injury to the cervical spine. Stable postsurgical changes at C4-7.   Electronically Signed   By: Julian Hy M.D.   On: 04/03/2014 20:51   Ct Chest Wo Contrast  04/03/2014   CLINICAL DATA:  Generalized pain after multiple falls.  EXAM: CT CHEST, ABDOMEN AND PELVIS WITHOUT CONTRAST  TECHNIQUE: Multidetector CT imaging of the chest, abdomen and pelvis was performed following the standard protocol without IV contrast.  COMPARISON:  CT scan of July 07, 2010.  FINDINGS: CT CHEST FINDINGS  No pneumothorax or pleural effusion is noted. Multiple ill-defined densities with branching nodularity are noted in the right upper lobe most consistent with focal inflammation. Some associated peribronchial thickening is noted as well. No mediastinal adenopathy is noted. Mild coronary artery calcifications  are noted. 5.6 x 2.3 cm mass is seen arising from the inferior portion of the left thyroid lobe. No significant osseous abnormality is noted in the chest. Spinal stimulator lead is seen in thoracic spinal canal.  CT ABDOMEN AND PELVIS FINDINGS  No gallstones are noted. No focal abnormality is noted in the liver, spleen or pancreas on these unenhanced images. Adrenal glands and kidneys appear normal. No hydronephrosis or renal obstruction is noted. No renal or ureteral calculi are noted. Stool is noted throughout the colon suggesting constipation. Urinary bladder appears normal. Atherosclerotic calcifications abdominal aorta are noted without aneurysm formation. No significant adenopathy is noted. No abnormal fluid collection is noted.  IMPRESSION: Multifocal ill-defined opacities are noted with branch shin nodularity in the right upper lobe most consistent with focal inflammation or pneumonia. Followup CT scan in 2-3 weeks is recommended to ensure resolution and rule out underlying nodules or mass.  5.6 x 2.3 cm mass is seen arising from the inferior portion of the left thyroid lobe. Thyroid ultrasound is recommended further evaluation.  Stool is noted throughout the colon suggesting constipation. No other significant abnormality is noted in the abdomen or pelvis.   Electronically Signed   By: Sabino Dick M.D.   On: 04/03/2014 21:04   Ct Cervical Spine Wo Contrast  04/03/2014   CLINICAL DATA:  Multiple falls over the past 3 weeks, pain all over, prior neck surgery, history of seizures  EXAM: CT HEAD WITHOUT CONTRAST  CT CERVICAL SPINE WITHOUT CONTRAST  TECHNIQUE: Multidetector CT imaging of the head and cervical spine was performed following the standard protocol without intravenous contrast. Multiplanar CT image reconstructions of the cervical spine were also generated.  COMPARISON:  01/23/2014  FINDINGS: CT HEAD FINDINGS  No evidence of parenchymal hemorrhage or extra-axial fluid collection. No mass lesion,  mass effect, or midline shift.  No CT evidence of acute infarction.  Mild intracranial atherosclerosis.  Cerebral volume is within normal limits.  No ventriculomegaly.  The visualized paranasal sinuses are essentially clear. The mastoid air cells are unopacified.  No evidence of calvarial fracture.  CT CERVICAL SPINE FINDINGS  Straightening of the cervical spine.  Status post C4-7 ACDF with corpectomy at C5 and C6.  No evidence of fracture or dislocation. Vertebral body heights are maintained. Dens appears intact.  No prevertebral soft tissue swelling.  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  Visualized lung apices are notable for mild paraseptal emphysematous changes.  IMPRESSION: No evidence of acute intracranial abnormality. Mild intracranial atherosclerosis.  No evidence of traumatic injury to the cervical spine. Stable postsurgical changes at C4-7.   Electronically Signed   By: Bertis Ruddy  [place] Pink M.D.   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 Previous FNA biopsy of inferior left thyroid mass 09/10/2009.  EXAM: THYROID ULTRASOUND  TECHNIQUE: Ultrasound examination of the thyroid gland and adjacent soft tissues was performed.  COMPARISON:  CT 04/03/2014 and earlier studies  FINDINGS: Right thyroid lobe  Measurements: 45 x 20 x 15 mm.  12 x 8 mm cysts, upper pole.  Left thyroid lobe  Measurements: 84 x 56 x 34 mm. Marked heterogeneity. A large (at least 5.2 cm) slightly hypoechoic solid mass extends inferiorly, incompletely visualized.  Isthmus  Thickness: 11 mm.  No nodules visualized.  Lymphadenopathy  None visualized.  IMPRESSION: 1. Thyromegaly with dominant left inferior pole mass. Correlate with previous biopsy results.   Electronically Signed   By: Arne Cleveland M.D.   On: 04/04/2014 15:46    Scheduled Meds: . aspirin EC  81 mg Oral Daily  . atorvastatin  20 mg Oral q1800  . buPROPion   300 mg Oral Daily  . carvedilol  3.125 mg Oral BID  . [START ON 04/06/2014] clonazePAM  0.5 mg Oral Daily  . clonazePAM  1 mg Oral QHS  . fentaNYL  75 mcg Transdermal Q72H  . FLUoxetine  80 mg Oral Daily  . furosemide  20 mg Oral BID  . gabapentin  200 mg Oral TID  . heparin  5,000 Units Subcutaneous 3 times per day  . insulin aspart  0-20 Units Subcutaneous TID WC  . insulin aspart  0-5 Units Subcutaneous QHS  . nicotine  14 mg Transdermal Daily  . pantoprazole  40 mg Oral Daily  . piperacillin-tazobactam (ZOSYN)  IV  3.375 g Intravenous Q8H  . potassium chloride SA  20 mEq Oral Daily  . rOPINIRole  6 mg Oral QHS  . senna  2 tablet Oral BID  . vancomycin  1,250 mg Intravenous Q24H   Continuous Infusions: . sodium chloride 50 mL/hr at 04/05/14 1108    Principal Problem:   HCAP (healthcare-associated pneumonia) Active Problems:   Orthostatic hypotension   Hypotension   OSA on CPAP   Acute on chronic renal failure   Chronic combined systolic and diastolic congestive heart failure   Hyperlipidemia   DM type 2 with diabetic peripheral neuropathy   Altered mental state   Myoclonic jerking   Chronic pain syndrome   Thyroid mass of unclear etiology    1. Healthcare associated pneumonia. The patient was treated in early November 2015 for healthcare associated pneumonia, apparently in [place]. His chest CT on admission revealed multifocal ill-defined opacities with nodularity in the right upper lobe, consistent with focal inflammation or pneumonia. Aspiration is a possibility, but given his history of tobacco abuse, malignancy is also a consideration.  Pulmonologist, Dr. Luan Pulling was consulted and agreed with medical management. He favors repeating a CT scan in the next few months and if the inflammatory process has not cleared up, he would recommend a bronchoscopy. Speech therapy did not see any gross evidence of aspiration per bedside evaluation. His wife is requesting a MBS,  so this will be ordered to see if he has silent aspiration. Continue vancomycin and Zosyn. Cefepime was discontinued. He remains afebrile. Blood cultures are negative to date. Strep pneumo antigen is negative. HIV serology is negative.  Recent history of hypotension/orthostatic hypotension. Per review of his chart, his cardiologist, Dr. Doren Custard, found that the patient was hypotensive and orthostatic on  12/1, so Zaroxolyn, lisinopril, isosorbide nitrate, and carvedilol were discontinued. His blood pressure was in the 443X systolically on admission and has increased following IV fluid hydration since admission. Will continue carvedilol which was restarted. Continue Lasix 20 mg twice a day. Consider restarting ACE inhibitor, but because of renal function, we'll continue to hold it. KVO IV fluids.  Acute kidney injury superimposed on stage III chronic kidney disease. Baseline creatinine 2.0-2.3. Creatinine has improved to baseline. Likely secondary to hypoperfusion and/or volume depletion. Continue gentle IV fluids, but decrease rate. Continue to hold ACE inhibitor.  Obstructive sleep apnea/COPD, on BiPAP. The patient reports being on BiPAP instead of CPAP nightly for obstructive sleep apnea. He denies being on home oxygen. Oxygen saturation in the 90s on room air. We'll order BiPAP nightly.  Chronic combined systolic and diastolic congestive heart failure. This appears to be compensated, no evidence of peripheral edema or pulmonary edema. Lasix was on hold due to hypotension/orthostatic hypotension recently, but was restarted at lower dosing. ACE inhibitor is on hold due acute on chronic renal failure. Blood pressure is better, so will continue Coreg.   Diabetes mellitus with peripheral neuropathy. CBGs reasonable; will continue insulin as ordered and make adjustments as needed. Order hemoglobin A1c. Restart home dose of 70/30 upon discharge.  Altered mental status/reported  delirium. The patient frequently falls off to sleep in the middle of a sentence. His wife reports falls at home. I believe this is in part due to polypharmacy. And, particularly when his renal function worsens, there is decreased clearance of his opiates and benzodiazepine. This was explained to the patient and his wife, but they are hesitant to decrease the doses. Nevertheless, fentanyl transdermal dosing and gabapentin dosing were decreased.  Frequent falls at home per history. I believe this is mostly from polypharmacy. Physical therapy evaluation/consultation and recommendations noted and appreciated. We'll order home health or outpatient PT.  Chronic anxiety/depression. Klonopin restarted (primarily prescribed for myoclonic jerk history). We'll continue bupropion and fluoxetine.  Chronic pain syndrome. He is on fentanyl transdermal. This will be started at a lower dose 75 g every 72 hours, down from 100 g. We'll decrease MSIR to twice a day rather than every 6 hours when necessary. Will decrease gabapentin to 200 mg 3 times daily instead of 300 mg 3 times daily.  Thyroid mass, seen on CT. Ultrasound shows left lobe thyromegaly. Query history of biopsy (forgot to ask patient and wife about biopsy history). TSH and free T4 are within normal limits.  Normocytic anemia. We'll continue to follow.   Time spent: 35 minutes.    [facility] Pager [phone]. If 7PM-7AM, please contact night-coverage at [email or web address], password [facility] 04/05/2014, 4:46 PM  LOS: 2 days

## 2014-04-05 NOTE — Progress Notes (Signed)
Patient noncompliant with diet.  Currently eating a chocolate cookie.  Explained the purpose of adhering  to a diet while here in the hospital.  Patient stated that he was going to eat what he wants.  Will continue to monitor.

## 2014-04-05 NOTE — Clinical Social Work Psychosocial (Signed)
Clinical Social Work Department BRIEF PSYCHOSOCIAL ASSESSMENT 04/05/2014  Patient:  [name], [name]     Account Number:  [id number]     Admit date:  04/03/2014  Clinical Social Worker:  Wyatt Haste  Date/Time:  04/05/2014 03:47 PM  Referred by:  Physician  Date Referred:  04/05/2014 Referred for  SNF Placement   Other Referral:   Interview type:  Patient Other interview type:   wife- [name]    PSYCHOSOCIAL DATA Living Status:  WIFE Admitted from facility:   Level of care:   Primary support name:  [name] Primary support relationship to patient:  SPOUSE Degree of support available:   adequate    CURRENT CONCERNS Current Concerns  Post-Acute Placement   Other Concerns:    SOCIAL WORK ASSESSMENT / PLAN CSW met with pt this morning alone. He was alert and oriented and reports he lives with his wife. Pt states he is independent at baseline. He did admit to multiple falls recently and said he has felt confused recently at times. Pt ambulates with a walker or cane, but also has a power wheelchair. CSW discussed d/c plan with pt. He indicates that he is aware his wife is looking at SNF for him, but states, "I don't think I need to go." He reports they have been looking into ALF in [facility], and will be moving in with their daughter until this happens. Pt's wife arrived this afternoon and CSW discussed plan again with both of them. She is aware that PT/OT/SLP have evaluated pt today and SNF is not recommended. PT feels pt may benefit from outpatient therapy for balance concerns reported by pt. Pt's wife states, "You all are inept here and don't know what you are doing." She continues, saying that pt is aspirating and also needs neurological testing. CSW agreed to discuss further with MD. Pt's wife reports that she has started ALF process and has notified her PCP to start FL2, but is clear that they will enter ALF together once they find a facility. She said that she cannot be without  pt. Wife is requesting home health for pt. CM notified.   Assessment/plan status:  Referral to [employer] Other assessment/ plan:   Information/referral to community resources:   none needed    PATIENT'S/FAMILY'S RESPONSE TO PLAN OF CARE: Pt's wife feels that pt requires further testing. MD aware of request. CSW will sign off as plan is for pt to return home and will be going to live at daughter's home.       [name], [name]

## 2014-04-06 ENCOUNTER — Inpatient Hospital Stay (HOSPITAL_COMMUNITY): Payer: Medicare HMO

## 2014-04-06 DIAGNOSIS — Z79899 Other long term (current) drug therapy: Secondary | ICD-10-CM

## 2014-04-06 LAB — BASIC METABOLIC PANEL
Anion gap: 11 (ref 5–15)
BUN: 24 mg/dL — ABNORMAL HIGH (ref 6–23)
CHLORIDE: 106 meq/L (ref 96–112)
CO2: 27 meq/L (ref 19–32)
Calcium: 8.7 mg/dL (ref 8.4–10.5)
Creatinine, Ser: 2.06 mg/dL — ABNORMAL HIGH (ref 0.50–1.35)
GFR calc Af Amer: 39 mL/min — ABNORMAL LOW (ref >90)
GFR calc non Af Amer: 34 mL/min — ABNORMAL LOW (ref >90)
GLUCOSE: 186 mg/dL — AB (ref 70–99)
POTASSIUM: 4.1 meq/L (ref 3.7–5.3)
SODIUM: 144 meq/L (ref 137–147)

## 2014-04-06 LAB — LEGIONELLA ANTIGEN, URINE

## 2014-04-06 LAB — CBC
HEMATOCRIT: 33.1 % — AB (ref 39.0–52.0)
Hemoglobin: 10.8 g/dL — ABNORMAL LOW (ref 13.0–17.0)
MCH: 28.4 pg (ref 26.0–34.0)
MCHC: 32.6 g/dL (ref 30.0–36.0)
MCV: 87.1 fL (ref 78.0–100.0)
Platelets: 220 10*3/uL (ref 150–400)
RBC: 3.8 MIL/uL — ABNORMAL LOW (ref 4.22–5.81)
RDW: 15 % (ref 11.5–15.5)
WBC: 8.8 10*3/uL (ref 4.0–10.5)

## 2014-04-06 LAB — GLUCOSE, CAPILLARY
GLUCOSE-CAPILLARY: 91 mg/dL (ref 70–99)
GLUCOSE-CAPILLARY: 257 mg/dL — AB (ref 70–99)
GLUCOSE-CAPILLARY: 117 mg/dL — AB (ref 70–99)
Glucose-Capillary: 173 mg/dL — ABNORMAL HIGH (ref 70–99)
Glucose-Capillary: 54 mg/dL — ABNORMAL LOW (ref 70–99)

## 2014-04-06 MED ORDER — LEVALBUTEROL HCL 0.63 MG/3ML IN NEBU
0.6300 mg | INHALATION_SOLUTION | RESPIRATORY_TRACT | Status: DC | PRN
  Administered 2014-04-06: 0.63 mg via RESPIRATORY_TRACT
  Filled 2014-04-06: qty 3

## 2014-04-06 MED ORDER — LEVALBUTEROL HCL 0.63 MG/3ML IN NEBU
0.6300 mg | INHALATION_SOLUTION | Freq: Three times a day (TID) | RESPIRATORY_TRACT | Status: DC
Start: 2014-04-06 — End: 2014-04-07
  Administered 2014-04-06 – 2014-04-07 (×2): 0.63 mg via RESPIRATORY_TRACT
  Filled 2014-04-06 (×2): qty 3

## 2014-04-06 NOTE — Progress Notes (Addendum)
[facility] PROGRESS NOTE  KYAIRE [name] HCW:[id number] DOB: May 01, 1955 DOA: 04/03/2014 PCP: Family physician is NOT Dr. Gerarda Fraction    Code Status: Full code Family Communication: Discussed with patient; discussed with his wife last evening on 12/10. Disposition Plan: Discharge when clinically appropriate; likely in the next 24 hours.   Consultants:  Pulmonology  Procedures:  None  Antibiotics:  Vancomycin 12/8>>  Zosyn 12/9>>  Cefepime 12/8>>12/9  HPI/Subjective: The patient is sitting up in the bed, alert and not sleeping or nodding off. He reports some shortness of breath overnight and requested a breathing treatment, but it was not ordered. He has no shortness of breath currently.  Objective: Filed Vitals:   04/06/14 0514  BP: 135/82  Pulse: 69  Temp: 97.9 F (36.6 C)  Resp: 20   oxygen saturation 99% on room air.  Intake/Output Summary (Last 24 hours) at 04/06/14 1140 Last data filed at 04/06/14 0900  Gross per 24 hour  Intake    840 ml  Output   4378 ml  Net  -3538 ml   Filed Weights   04/03/14 1923 04/03/14 2328  Weight: 92.534 kg (204 lb) 93.033 kg (205 lb 1.6 oz)    Exam:   General:  Patient sitting up in the bed, alert and not sleeping or nodding off.  Cardiovascular: S1, S2, with no murmurs rubs or gallops.  Respiratory: Coarse breath sounds, but no audible wheezes or crackles. Breathing is nonlabored.  Abdomen: Positive bowel sounds, soft, nontender, nondistended.  Musculoskeletal: No acute hot red joints. No pedal edema.  Neurologic: Alert and oriented 2. Cranial nerves II through XII are grossly intact.  Data Reviewed: Basic Metabolic Panel:  Recent Labs Lab 04/03/14 2059 04/04/14 0141 04/05/14 0557 04/06/14 0555  NA 142 141 146 144  K 4.6 4.6 4.4 4.1  CL 105 105 110 106  CO2 26 26 26 27   GLUCOSE 95 159* 139* 186*  BUN 39* 37* 26* 24*  CREATININE 3.11* 2.95* 2.19* 2.06*  CALCIUM 8.9 8.5 8.6 8.7  MG 2.1  --   --   --     Liver Function Tests:  Recent Labs Lab 04/03/14 2059  AST 16  ALT 8  ALKPHOS 74  BILITOT 0.2*  PROT 6.6  ALBUMIN 3.1*   No results for input(s): LIPASE, AMYLASE in the last 168 hours.  Recent Labs Lab 04/03/14 2059  AMMONIA 24   CBC:  Recent Labs Lab 04/03/14 2059 04/04/14 0141 04/05/14 0557 04/06/14 0555  WBC 6.7 7.2 8.1 8.8  NEUTROABS 4.0  --   --   --   HGB 11.1* 10.6* 10.5* 10.8*  HCT 34.3* 32.2* 32.5* 33.1*  MCV 87.5 86.8 87.4 87.1  PLT 224 207 205 220   Cardiac Enzymes: No results for input(s): CKTOTAL, CKMB, CKMBINDEX, TROPONINI in the last 168 hours. BNP (last 3 results)  Recent Labs  05/30/13 2025 06/23/13 1602 06/24/13 0542  PROBNP 9807.0* 2103.0* 9222.0*   CBG:  Recent Labs Lab 04/05/14 1153 04/05/14 1701 04/05/14 2107 04/06/14 0812 04/06/14 1103  GLUCAP 102* 178* 178* 173* 91    Recent Results (from the past 240 hour(s))  Culture, blood (routine x 2) Call MD if unable to obtain prior to antibiotics being given     Status: None (Preliminary result)   Collection Time: 04/04/14  1:41 AM  Result Value Ref Range Status   Specimen Description BLOOD LEFT FOREARM  Final   Special Requests BOTTLES DRAWN AEROBIC AND ANAEROBIC [place]  Final  Culture NO GROWTH 1 DAY  Final   Report Status PENDING  Incomplete  Culture, blood (routine x 2) Call MD if unable to obtain prior to antibiotics being given     Status: None (Preliminary result)   Collection Time: 04/04/14  1:41 AM  Result Value Ref Range Status   Specimen Description BLOOD RIGHT ANTECUBITAL  Final   Special Requests   Final    BOTTLES DRAWN AEROBIC AND ANAEROBIC AEB=10CC ANA=7CC   Culture NO GROWTH 1 DAY  Final   Report Status PENDING  Incomplete     Studies: Dg Chest 2 View  04/06/2014   CLINICAL DATA:  58 year old male with shortness of breath  EXAM: CHEST  2 VIEW  COMPARISON:  Correlation with CT from 04/03/2014  FINDINGS: The cardiac silhouette is upper limits of normal  for size. The mediastinal contours are within normal range.  Hazy opacity overlies a right hemithorax, primarily involving the upper lung fields. The left hemithorax appears well aerated. There is no pleural effusion. There is no pneumothorax. There is no overt pulmonary edema.  Neurostimulator leads and cervical fixation hardware are noted.  IMPRESSION: Diffuse hazy right hemithorax opacity remain suspicious for pneumonitis as described on recent CT.   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 Previous FNA biopsy of inferior left thyroid mass 09/10/2009.  EXAM: THYROID ULTRASOUND  TECHNIQUE: Ultrasound examination of the thyroid gland and adjacent soft tissues was performed.  COMPARISON:  CT 04/03/2014 and earlier studies  FINDINGS: Right thyroid lobe  Measurements: 45 x 20 x 15 mm.  12 x 8 mm cysts, upper pole.  Left thyroid lobe  Measurements: 84 x 56 x 34 mm. Marked heterogeneity. A large (at least 5.2 cm) slightly hypoechoic solid mass extends inferiorly, incompletely visualized.  Isthmus  Thickness: 11 mm.  No nodules visualized.  Lymphadenopathy  None visualized.  IMPRESSION: 1. Thyromegaly with dominant left inferior pole mass. Correlate with previous biopsy results.   Electronically Signed   By: Arne [place] M.D.   On: 04/04/2014 15:46   Dg Swallowing Func-speech Pathology  04/05/2014   Ephraim Hamburger, CCC-SLP     04/05/2014  6:35 PM Objective Swallowing Evaluation: Modified Barium Swallowing Study   Patient Details  Name: [name] MRN: [id number] Date of Birth: 1955/06/30  Today's Date: 04/05/2014 Time: 1700-1732 SLP Time Calculation (min) (ACUTE ONLY): 32 min  Past Medical History:  Past Medical History  Diagnosis Date  . Type 2 diabetes mellitus   . Essential hypertension, benign   . Nonischemic cardiomyopathy     LVEF 20-25%, prior history of LV mural thrombus on  Coumadin.  . CKD (chronic kidney disease) stage 3, GFR 30-59 ml/min   . Arthritis   . Obesity   . Depression   . Sleep apnea   . Diabetic neuropathy   . Coronary atherosclerosis of native coronary artery     Nonobstructive by cardiac catheterization September 2008  . Esophagitis, reflux 07/07/10    MW tear, erosive reflux esophagitis, 2cm hh  . S/P colonoscopy April 2012    Rourk: torturous colon, hyperplastic polyps  . Tremor   . Diverticulum   . Hiatal hernia   . Gastroparesis 11/2010    No emptying at two hours.   . Chronic back pain   . Clonic seizure     Controlled with klonodine  .  Acute respiratory failure with hypoxia 06/23/2013    Due to opiate toxicity. Intubated & extubated  . COPD (chronic obstructive pulmonary disease)   . CHF (congestive heart failure)    Past Surgical History:  Past Surgical History  Procedure Laterality Date  . Neck surgery      Discectomy  . Hand surgery      Rght-carpal tunnel  . Colonoscopy  before 2002    [place]  . Appendectomy    . Other surgical history      Bone graft-knee  . Anterior lumbar corpectomy w/ fusion      C5-6/C4-7  . Colonoscopy  08/21/2010    Elongaed tortuous colon otherwise normal  . Esophagogastroduodenoscopy  07/08/10    Dr. Volney American esophageal erosions and excoriations  consistent with erosive reflux esophagitis, hiatal hernia  . Knee arthroscopy      Right  . Esophagogastroduodenoscopy (egd) with propofol  02/25/2012    Procedure: ESOPHAGOGASTRODUODENOSCOPY (EGD) WITH PROPOFOL;   Surgeon: Daneil Dolin, MD;  Location: [facility];  Service:  Endoscopy;  Laterality: N/A;  7:30 /POLYPHARMACY   HPI:  Mr. Minard Millirons is a 58  y.o. male who presents to the Emergency  Department complaining of multiple falls and gradually worsening,  decreased cognition that started 3 weeks ago. His wife notes that  he has fallen 15-20 times in the last 3 weeks. Pt states repeated  falls that occur 1-5 times each day because of lack of balance.  He states difficulty talking, multiple  bruises and a few loose  teeth as associated symptoms. Pt saw cardiologist for orthostatic  hypotension and syncope last week. He was admitted to the  hospital where they held blood pressure medication until  resolved, including Lasix. His wife notes improvement of symptoms  since seeing the cardiologist. Pt also complains that cognition  has decreased without clonazepam use. He stopped refilling  prescription because of financial reasons. His wife states "he  loses cognition out of nowhere" which she describes as losing  control of his mouth. She notes symptoms occur mostly at night,  but sometimes in the middle of the day. Pt states he gets up  frequently at night to use the bathroom and loses his way around  the house. Pt has been wearing a Fentanyl patch for 1 year for  chronic back pain, arthritis and neck pain. He has also lost over  300 lbs over the last 2 years because of loss of appetite. Pt's  wife states that they are going to live with his daughter and  then enter an assisted living facility. SLP asked to evaluated  swallow function to assess for possible aspiration.     Assessment / Plan / Recommendation Clinical Impression  Dysphagia Diagnosis: Within Functional Limits Clinical impression: SLP evaluated pt at bedside earlier today  and found no signs/symptoms of aspiration, however pt's wife was  insistant that he have a modified barium swallow study. Dr.  Caryn Section requested MBSS. Pt assessed (and challenged) with puree,  thin cup and straw, barium tablet, and regular textures.  Oropharyngeal swallow is essentially [facility]. Pt with mild puree  residue in valleculae after initial swallow, which cleared with  spontaneous secondary swallow. Trace residuals in lateral  channels when drinking straw sips thin, which also clear with  spontaneous secondary swallow. Pt instructed to swallow the  barium tablet with thin liquid (placed in his hand), however he  swallowed the pill without liquid and it dropped to the   valleculae. Pt  cued to drink liquid and pill cleared; no delay  noted in esophageal transit of pill. During esophageal sweep,  liquids were noted to remain in distal esophagus for ~20 seconds,  but cleared. Recommend regular diet and thin liquids. Pt  encouraged to only consume meals when alert and upright and to  always take pills with a liquid to ensure pharyngeal and  esophageal clearance. Swallow function essentially WNL.    Treatment Recommendation  No treatment recommended at this time    Diet Recommendation Regular;Thin liquid   Liquid Administration via: Cup;Straw Medication Administration: Whole meds with liquid Supervision: Patient able to self feed Postural Changes and/or Swallow Maneuvers: Seated upright 90  degrees;Out of bed for meals;Upright 30-60 min after meal    Other  Recommendations Oral Care Recommendations: Oral care BID Other Recommendations: Clarify dietary restrictions   Follow Up Recommendations  None    Frequency and Duration  N/A         General Date of Onset: 04/03/14 HPI: Mr. Loni Abdon is a 58  y.o. male who presents to the  Emergency Department complaining of multiple falls and gradually  worsening, decreased cognition that started 3 weeks ago. His wife  notes that he has fallen 15-20 times in the last 3 weeks. Pt  states repeated falls that occur 1-5 times each day because of  lack of balance. He states difficulty talking, multiple bruises  and a few loose teeth as associated symptoms. Pt saw cardiologist  for orthostatic hypotension and syncope last week. He was  admitted to the hospital where they held blood pressure  medication until resolved, including Lasix. His wife notes  improvement of symptoms since seeing the cardiologist. Pt also  complains that cognition has decreased without clonazepam use. He  stopped refilling prescription because of financial reasons. His  wife states "he loses cognition out of nowhere" which she  describes as losing control of his mouth. She notes  symptoms  occur mostly at night, but sometimes in the middle of the day. Pt  states he gets up frequently at night to use the bathroom and  loses his way around the house. Pt has been wearing a Fentanyl  patch for 1 year for chronic back pain, arthritis and neck pain.  He has also lost over 300 lbs over the last 2 years because of  loss of appetite. Pt's wife states that they are going to live  with his daughter and then enter an assisted living facility. SLP  asked to evaluated swallow function to assess for possible  aspiration. Type of Study: Modified Barium Swallowing Study Reason for Referral: Objectively evaluate swallowing function Previous Swallow Assessment: BSE 04/05/2014 reg/thin no f/u;  March 2015 BSE reg/thin Diet Prior to this Study: Regular;Thin liquids Temperature Spikes Noted: No Respiratory Status: Room air History of Recent Intubation: No Behavior/Cognition: Alert;Cooperative;Pleasant mood;Confused Oral Cavity - Dentition: Adequate natural dentition;Poor  condition Oral Motor / Sensory Function: Within functional limits Self-Feeding Abilities: Able to feed self Patient Positioning: Upright in chair Baseline Vocal Quality: Clear Volitional Cough: Strong Volitional Swallow: Able to elicit Anatomy: Within functional limits (evidence of c-spine surgery) Pharyngeal Secretions: Not observed secondary MBS    Reason for Referral Objectively evaluate swallowing function   Oral Phase Oral Preparation/Oral Phase Oral Phase: WFL   Pharyngeal Phase Pharyngeal Phase Pharyngeal Phase: Impaired Pharyngeal - Thin Pharyngeal - Thin Cup: Within functional limits Pharyngeal - Thin Straw: Lateral channel residue (trace residuals  in lateral channel clear with swallow) Pharyngeal -  Solids Pharyngeal - Puree: Within functional limits;Pharyngeal residue -  valleculae (mild residuals in valleculae clear with 2nd swallow) Pharyngeal - Regular: Within functional limits Pharyngeal - Pill:  (pt took without water initially  and it  dropped to valleculae)   Cervical Esophageal Phase Cervical Esophageal Phase: [facility]        Thank you,  Genene Churn, [place]  [place] 04/05/2014, 6:34 PM     Scheduled Meds: . aspirin EC  81 mg Oral Daily  . atorvastatin  20 mg Oral q1800  . buPROPion  300 mg Oral Daily  . carvedilol  3.125 mg Oral BID  . clonazePAM  0.5 mg Oral Daily  . clonazePAM  1 mg Oral QHS  . fentaNYL  75 mcg Transdermal Q72H  . FLUoxetine  80 mg Oral Daily  . furosemide  20 mg Oral BID  . gabapentin  200 mg Oral TID  . heparin  5,000 Units Subcutaneous 3 times per day  . insulin aspart  0-20 Units Subcutaneous TID WC  . insulin aspart  0-5 Units Subcutaneous QHS  . nicotine  14 mg Transdermal Daily  . pantoprazole  40 mg Oral Daily  . piperacillin-tazobactam (ZOSYN)  IV  3.375 g Intravenous Q8H  . potassium chloride SA  20 mEq Oral Daily  . rOPINIRole  6 mg Oral QHS  . senna  2 tablet Oral BID  . vancomycin  1,250 mg Intravenous Q24H   Continuous Infusions: . sodium chloride 50 mL/hr at 04/06/14 1005   Assessment and plan: Principal Problem:   HCAP (healthcare-associated pneumonia) Active Problems:   Hypotension   OSA on CPAP   Polypharmacy   Acute on chronic renal failure   Orthostatic hypotension   Chronic combined systolic and diastolic congestive heart failure   Hyperlipidemia   DM type 2 with diabetic peripheral neuropathy   Altered mental state   Myoclonic jerking   Chronic pain syndrome   Tobacco abuse   Thyroid mass of unclear etiology    1. Healthcare associated pneumonia. The patient was treated in early November 2015 for healthcare associated pneumonia, apparently in [place]. His chest CT on admission revealed multifocal ill-defined opacities with nodularity in the right upper lobe, consistent with focal inflammation or pneumonia. Aspiration was considered as a possible etiology, but given his history of tobacco abuse, malignancy is also a  consideration.  Pulmonologist, Dr. Luan Pulling was consulted and agreed with medical management. He favors repeating a CT scan in the next few months and if the inflammatory process has not cleared up, he would recommend a bronchoscopy. Speech therapy did not see any gross evidence of aspiration at the bedside in the modified barium swallow confirmed no aspiration. I still would not rule out aspiration when the patient is sedated from polypharmacy. Precautions/instructions given by the speech therapist. Continue vancomycin and Zosyn. Cefepime was discontinued. He remains afebrile. Blood cultures are negative to date. Strep pneumo antigen is negative. HIV serology is negative. He had some shortness of breath overnight, so will order a chest x-ray today.  Recent history of hypotension/orthostatic hypotension. Per review of his chart, his cardiologist, Dr. Doren Custard, found that the patient was hypotensive and orthostatic on 03/27/14, so Zaroxolyn, lisinopril, isosorbide nitrate, and carvedilol were discontinued. His blood pressure was in the 638V systolically on admission and has increased following IV fluid hydration since admission. Will continue carvedilol which was restarted. Restarted Lasix at Lasix 20 mg twice a day. Consider restarting ACE inhibitor, but because of renal function, we'll continue  to hold it. KVO IV fluids.  Acute kidney injury superimposed on stage III chronic kidney disease.  Baseline creatinine 2.0-2.3. It was 3.11 on admission. Creatinine has improved to baseline. Likely secondary to hypoperfusion and/or volume depletion. Status post IV fluid hydration. We'll KVO IV fluids. Continue to hold ACE inhibitor.  Obstructive sleep apnea/COPD, on BiPAP. The patient reports being on BiPAP instead of CPAP nightly for obstructive sleep apnea. He denies being on home oxygen. He is refusing BiPAP as he does not tolerate the full mask but rather he is on the nasal BiPAP at home. Oxygen  saturation in the 90s on room air.  Chronic combined systolic and diastolic congestive heart failure. This appears to be compensated, no evidence of peripheral edema or pulmonary edema. Lasix was initially held due to hypotension/orthostatic hypotension recently, but was restarted at lower dosing. ACE inhibitor is on hold due acute on chronic renal failure. Blood pressure is better, so will continue Coreg.   Diabetes mellitus with peripheral neuropathy. CBGs have been reasonable; will continue insulin as ordered and make adjustments as needed.  Hemoglobin A C was 9.1. Diabetes coordinator assessment noted and appreciated. I will defer long-term management to his primary care physician. I suspect some element of noncompliance with 70/30 insulin regimen at home. Restart home dose of 70/30 upon discharge, with possible mild adjustments.  Altered mental status/reported delirium. The patient had been frequently falling off to sleep in the middle of a sentence during the hospital course. His wife reports recent nontraumatic falls at home. I believe this is in part due to polypharmacy. And, particularly when his renal function worsens, there is decreased clearance of his opiates and benzodiazepine. This was explained to the patient and his wife, but they are hesitant to decrease the doses. Nevertheless, fentanyl transdermal dosing, when necessary MSIR dosing and gabapentin dosing were decreased.  Frequent falls at home per history. I believe this is mostly from polypharmacy. Physical therapy evaluation/consultation and recommendations noted and appreciated. We'll order home health or outpatient PT.  Chronic anxiety/depression. Klonopin restarted (primarily prescribed for myoclonic jerk history). We'll continue bupropion and fluoxetine.  Chronic pain syndrome. He is on fentanyl transdermal. This will be started at a lower dose 75 g every 72 hours, down from 100 g.  MSIR was decreased to twice a  day rather than every 6 hours when necessary. Gabapentin was decreased to 200 mg 3 times daily instead of 300 mg 3 times daily.  Thyroid mass, seen on CT. Ultrasound shows left lobe thyromegaly. Per patient, a biopsy a few years ago revealed a benign pathology. TSH and free T4 are within normal limits.  Normocytic anemia. Stable and likely chronic. We'll continue to follow. Defer further diagnostic workup to the outpatient setting.   Time spent: 30 minutes.    [facility] Pager [phone]. If 7PM-7AM, please contact night-coverage at [email or web address], password [facility] 04/06/2014, 11:40 AM  LOS: 3 days

## 2014-04-06 NOTE — Progress Notes (Signed)
Spoke with patient about diabetes and home regimen for diabetes control. Patient reports that he is followed by his PCP for diabetes management and currently he takes 70/30 10-22 units BID (with breakfast and supper) depending on glucose as an outpatient for diabetes control.  According to the patient he checks his glucose 2-3 times per day and it ranges over 200's mg/dl. Inquired about low blood glucose and patient reports that his sugar gets low at least 4 times per week and it typically occurs during the middle of the night. Patient states that he wakes up from sleep with hypoglycemia and checks glucose which is sometimes as low as 30 mg/dl and he treats with a few pieces of hard candy or a piece of fruit.  Inquired about knowledge about A1C and patient reports that he knows what an A1C is and reports that his last A1C was in the 10% range. Inquired about when the last time his insulin was adjusted and patient states that he has been on his current regimen for at least 3 years. Patient reports that his "PCP is limited to medication changes because he gets his medications from the [place]". Explained that he may need insulin adjustments since his glucose is fluctuating from lows during the night to upper 200's during the day. Informed patient that since he has been admitted he has only received Novolog correction insulin and glucose has been fairly well controlled. Discussed A1C results (9.1% on 04/04/14) and reviewed basic pathophysiology of DM Type 2, basic home care, importance of checking CBGs and maintaining good CBG control to prevent long-term and short-term complications. Discussed impact of nutrition, exercise, stress, sickness, and medications on diabetes control.  Discussed carbohydrates, carbohydrate goals per day and meal, along with portion sizes. Patient states that he uses the plate method and tries to follow a carbohydrate modified diet. Encouraged patient to check his glucose 3-4 times per day and to  take his meter with him to follow up visits so he can discuss glycemic control with his doctor and make sure his doctor is aware of hypoglycemia so changes can be made with his insulin regimen to improve glycemic control. Patient verbalized understanding of information discussed and he states that he has no further questions at this time related to diabetes.   MD- May want to consider changing outpatient diabetes medications since A1C is 9.1%, blood glucose is elevated over 200 mg/dl throughout the day, and patient reports he is having at least 4 hypoglycemic episodes per week during the middle of the night.  Thanks, Barnie Alderman, RN, MSN, CCRN Diabetes Coordinator Inpatient Diabetes Program [phone] (Team Pager) [phone] ([facility] office) [phone] [facility] office)

## 2014-04-06 NOTE — Progress Notes (Signed)
Patient continues to refuse CPAP at night;monitoerd patient through the night for any status changes. Patient continues to do as he please pertaining to his care;will continue to monitor patient

## 2014-04-06 NOTE — Care Management Utilization Note (Signed)
UR complete 

## 2014-04-07 ENCOUNTER — Encounter (HOSPITAL_COMMUNITY): Payer: Self-pay | Admitting: Internal Medicine

## 2014-04-07 DIAGNOSIS — R001 Bradycardia, unspecified: Secondary | ICD-10-CM | POA: Diagnosis not present

## 2014-04-07 DIAGNOSIS — N183 Chronic kidney disease, stage 3 (moderate): Secondary | ICD-10-CM

## 2014-04-07 DIAGNOSIS — Z720 Tobacco use: Secondary | ICD-10-CM

## 2014-04-07 LAB — CBC
HCT: 33.7 % — ABNORMAL LOW (ref 39.0–52.0)
Hemoglobin: 10.9 g/dL — ABNORMAL LOW (ref 13.0–17.0)
MCH: 28.2 pg (ref 26.0–34.0)
MCHC: 32.3 g/dL (ref 30.0–36.0)
MCV: 87.1 fL (ref 78.0–100.0)
PLATELETS: 213 10*3/uL (ref 150–400)
RBC: 3.87 MIL/uL — ABNORMAL LOW (ref 4.22–5.81)
RDW: 15.1 % (ref 11.5–15.5)
WBC: 8.6 10*3/uL (ref 4.0–10.5)

## 2014-04-07 LAB — BASIC METABOLIC PANEL
Anion gap: 11 (ref 5–15)
BUN: 22 mg/dL (ref 6–23)
CO2: 27 mEq/L (ref 19–32)
Calcium: 8.4 mg/dL (ref 8.4–10.5)
Chloride: 105 mEq/L (ref 96–112)
Creatinine, Ser: 1.95 mg/dL — ABNORMAL HIGH (ref 0.50–1.35)
GFR, EST AFRICAN AMERICAN: 42 mL/min — AB (ref >90)
GFR, EST NON AFRICAN AMERICAN: 36 mL/min — AB (ref >90)
Glucose, Bld: 224 mg/dL — ABNORMAL HIGH (ref 70–99)
Potassium: 4.5 mEq/L (ref 3.7–5.3)
Sodium: 143 mEq/L (ref 137–147)

## 2014-04-07 LAB — GLUCOSE, CAPILLARY
Glucose-Capillary: 193 mg/dL — ABNORMAL HIGH (ref 70–99)
Glucose-Capillary: 143 mg/dL — ABNORMAL HIGH (ref 70–99)

## 2014-04-07 MED ORDER — FUROSEMIDE 40 MG PO TABS: 20.0000 mg | ORAL_TABLET | Freq: Every day | ORAL | Status: AC | PRN

## 2014-04-07 MED ORDER — AMOXICILLIN-POT CLAVULANATE 500-125 MG PO TABS: 1.0000 | ORAL_TABLET | Freq: Two times a day (BID) | ORAL | Status: AC

## 2014-04-07 MED ORDER — GABAPENTIN 300 MG PO CAPS: 300.0000 mg | ORAL_CAPSULE | Freq: Three times a day (TID) | ORAL | Status: AC

## 2014-04-07 MED ORDER — ALBUTEROL SULFATE HFA 108 (90 BASE) MCG/ACT IN AERS
2.0000 | INHALATION_SPRAY | Freq: Four times a day (QID) | RESPIRATORY_TRACT | Status: AC | PRN
Start: 2014-04-07 — End: ?

## 2014-04-07 MED ORDER — MORPHINE SULFATE 15 MG PO TABS: 15.0000 mg | ORAL_TABLET | Freq: Two times a day (BID) | ORAL | Status: AC | PRN

## 2014-04-07 MED ORDER — INSULIN NPH (HUMAN) (ISOPHANE) 100 UNIT/ML ~~LOC~~ SUSP
7.0000 [IU] | Freq: Two times a day (BID) | SUBCUTANEOUS | Status: DC
  Administered 2014-04-07: 7 [IU] via SUBCUTANEOUS
  Filled 2014-04-07: qty 10

## 2014-04-07 MED ORDER — INSULIN ASPART PROT & ASPART (70-30 MIX) 100 UNIT/ML ~~LOC~~ SUSP: SUBCUTANEOUS | Status: AC

## 2014-04-07 NOTE — Progress Notes (Signed)
ANTIBIOTIC CONSULT NOTE - follow up  Pharmacy Consult for Vancomycin & Zosyn Indication: HCAP  Allergies  Allergen Reactions  . Ms Contin [Morphine Sulfate Er]     Doesn't like to take in liquid form( burns and doesn't help) (pill form works fine)    Patient Measurements: Height: 6' (182.9 cm) Weight: 205 lb 1.6 oz (93.033 kg) IBW/kg (Calculated) : 77.6  Vital Signs: Temp: 98.3 F (36.8 C) (12/12 1050) Temp Source: Oral (12/12 1050) BP: 127/71 mmHg (12/12 1050) Pulse Rate: 60 (12/12 1050) Intake/Output from previous day: 12/11 0701 - 12/12 0700 In: 1080 [P.O.:1080] Out: 2950 [Urine:2950] Intake/Output from this shift: Total I/O In: 240 [P.O.:240] Out: 550 [Urine:550]  Labs:  Recent Labs  04/05/14 0557 04/06/14 0555 04/07/14 0547  WBC 8.1 8.8 8.6  HGB 10.5* 10.8* 10.9*  PLT 205 220 213  CREATININE 2.19* 2.06* 1.95*   Estimated Creatinine Clearance: 44.8 mL/min (by C-G formula based on Cr of 1.95). No results for input(s): VANCOTROUGH, VANCOPEAK, VANCORANDOM, GENTTROUGH, GENTPEAK, GENTRANDOM, TOBRATROUGH, TOBRAPEAK, TOBRARND, AMIKACINPEAK, AMIKACINTROU, AMIKACIN in the last 72 hours.   Microbiology: Recent Results (from the past 720 hour(s))  Culture, blood (routine x 2) Call MD if unable to obtain prior to antibiotics being given     Status: None (Preliminary result)   Collection Time: 04/04/14  1:41 AM  Result Value Ref Range Status   Specimen Description BLOOD LEFT FOREARM  Final   Special Requests BOTTLES DRAWN AEROBIC AND ANAEROBIC [place]  Final   Culture NO GROWTH 1 DAY  Final   Report Status PENDING  Incomplete  Culture, blood (routine x 2) Call MD if unable to obtain prior to antibiotics being given     Status: None (Preliminary result)   Collection Time: 04/04/14  1:41 AM  Result Value Ref Range Status   Specimen Description BLOOD RIGHT ANTECUBITAL  Final   Special Requests   Final    BOTTLES DRAWN AEROBIC AND ANAEROBIC AEB=10CC ANA=7CC   Culture  NO GROWTH 1 DAY  Final   Report Status PENDING  Incomplete   Medical History: Past Medical History  Diagnosis Date  . Type 2 diabetes mellitus   . Essential hypertension, benign   . 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  . CKD (chronic kidney disease) stage 3, GFR 30-59 ml/min   . Arthritis   . Obesity   . Depression   . Sleep apnea   . Diabetic neuropathy   . Coronary atherosclerosis of native coronary artery     Nonobstructive by cardiac catheterization September 2008  . Esophagitis, reflux 07/07/10    MW tear, erosive reflux esophagitis, 2cm hh  . S/P colonoscopy April 2012    Rourk: torturous colon, hyperplastic polyps  . Tremor   . Diverticulum   . Hiatal hernia   . Gastroparesis 11/2010    No emptying at two hours.   . Chronic back pain   . Clonic seizure     Controlled with klonodine  . Acute respiratory failure with hypoxia 06/23/2013    Due to opiate toxicity. Intubated & extubated  . COPD (chronic obstructive pulmonary disease)   . CHF (congestive heart failure)    Medications:  Scheduled:  . aspirin EC  81 mg Oral Daily  . atorvastatin  20 mg Oral q1800  . buPROPion  300 mg Oral Daily  . clonazePAM  0.5 mg Oral Daily  . clonazePAM  1 mg Oral QHS  .  fentaNYL  75 mcg Transdermal Q72H  . FLUoxetine  80 mg Oral Daily  . furosemide  20 mg Oral BID  . gabapentin  200 mg Oral TID  . heparin  5,000 Units Subcutaneous 3 times per day  . insulin aspart  0-20 Units Subcutaneous TID WC  . insulin aspart  0-5 Units Subcutaneous QHS  . insulin NPH Human  7 Units Subcutaneous BID AC & HS  . levalbuterol  0.63 mg Nebulization TID  . nicotine  14 mg Transdermal Daily  . pantoprazole  40 mg Oral Daily  . piperacillin-tazobactam (ZOSYN)  IV  3.375 g Intravenous Q8H  . potassium chloride SA  20 mEq Oral Daily  . rOPINIRole  6 mg Oral QHS  . senna  2 tablet Oral BID  . vancomycin  1,250 mg Intravenous Q24H    Infusions:  . sodium chloride 10 mL/hr at 04/06/14 1248   PRN: acetaminophen **OR** acetaminophen, morphine, ondansetron (ZOFRAN) IV, traZODone   Assessment: 58yo M admitted with altered mental status, progressive weakness & falls over past couple weeks.  He was hospitalized in November with PNA.   CXR + PNA and patient started on empiric, broad-spectrum antibiotics for HCAP.     He is afebrile with normal WBC.  Acute on chronic kidney disease noted (baseline Scr ~2).  CrCl currently >50ml/min.  Vancomycin 12/9>> Cefepime 12/9>>12/9 Zosyn 12/9>>  Goal of Therapy:  Vancomycin trough 15-20 mcg/ml  Plan:  1.  Continue Vancomycin 1250mg  IV q24h 2.  Check Vancomycin trough at steady state 3.  Zosyn 3.375gm IV Q8h to be infused over 4hrs 4.  Monitor renal function and cx data   Hart Robinsons A 04/07/2014,12:16 PM

## 2014-04-07 NOTE — Discharge Summary (Signed)
Physician Discharge Summary  [name] WJX:[id number] DOB: 1955/09/23 DOA: 04/03/2014  PCP: [facility] in [place] in [place]: Dr. Wess Botts (?) Neurologist: Dr. Tonye Royalty Cardiologist: Ida Rogue; [place] date: 04/03/2014 Discharge date: 04/07/2014  Time spent: Greater than 30 minutes  Recommendations for Outpatient Follow-up:  1. Recommend follow-up CT scan of patient's chest in 3-6 months. If right upper lobe infiltrate still present, consider bronchoscopy for further evaluation. 2. Recommend titrating down doses of Duragesic patch, MSIR, and gabapentin. 3. Home health physical therapy ordered   Discharge Diagnoses:   1. Healthcare associated pneumonia; right upper lobe. Recommend follow-up CT of the chest in 3-6 months. 2. History of falls at home; secondary to chronic polypharmacy; degenerative joint disease and peripheral neuropathy. 3. Altered mental status/encephalopathy, likely secondary to pneumonia and polypharmacy. 4. 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 All multiple psychotropic medications (fentanyl patch; MSIR; Requip; clonazepam; gabapentin; when necessary trazodone). 5. Acute renal injury superimposed on stage III chronic kidney disease. --- Creatinine was 3.11 on admission and 1.95 at the time of discharge. --- Etiology prerenal azotemia and hypoperfusion from low-normal blood pressures. 6. Bradycardia, likely from accommodation of opioids and carvedilol. 7. Chronic combined systolic and diastolic congestive heart failure. Remained compensated. 8. COPD with ongoing tobacco abuse. Patient advised to stop smoking. 9. Obstructive sleep apnea, on nasal CPAP. 10. Chronic left lobe thyroid mass; reported biopsy in the past was negative. 11. Recent history of hypotension and orthostatic hypotension. 12.  Diabetes mellitus with diabetic peripheral neuropathy. 13. Normocytic anemia.     Discharge Condition: Improved  Diet recommendation: heart healthy/carbohydrate modified  Filed Weights   04/03/14 1923 04/03/14 2328  Weight: 92.534 kg (204 lb) 93.033 kg (205 lb 1.6 oz)    History of present illness:  The patient is a 58 year old man with multiple medical conditions including chronic congestive heart failure, chronic pain syndrome, obstructive sleep apnea, myoclonic jerking, and diabetes mellitus. He presented to the emergency department on 04/03/2014 with generalized weakness, non-traumatic falls, and confusion as reported by his wife. He was recently hospitalized in November 2015 at [facility] for pneumonia. He was also evaluated by his cardiologist, Dr. Rockey Situ 1 week ago and was noted to be hypotensive and orthostatic. At that time, he was advised to not take his antihypertensive medications and Lasix. In the ED, he was afebrile and hemodynamically stable. He was oxygenating 97% on room air. CT of his head revealed no evidence of fracture or acute abnormalities. CT of his cervical spine revealed status post C4-7 ACDF with corpectomy at C5 and C6, but no acute fracture. CT of his chest revealed multifocal ill-defined opacities in the right upper lobe and a 5.6 x 2.3 cm mass in the inferior portion of the left thyroid lobe. CT of his abdomen and pelvis revealed stool throughout the colon but no other significant abnormality. He was admitted for further evaluation and management.  Hospital Course:   1. Healthcare associated pneumonia. The patient was treated in early November 2015 for healthcare associated pneumonia, apparently in [place]. His chest CT on admission revealed multifocal ill-defined opacities with nodularity in the right upper lobe, consistent with focal inflammation or pneumonia. He was started on treatment with cefepime and vancomycin. Aspiration was considered as a  possible etiology, but given his history of tobacco abuse, malignancy was also a consideration. Therefore, cefepime was discontinued in favor of Zosyn. Pulmonologist, Dr. Luan Pulling  was consulted and agreed with medical management. He favored repeating a CT scan in the next few months and if the inflammatory process had not cleared up, he would recommend a bronchoscopy. Speech therapy did not see any evidence of aspiration at the bedside or with the modified barium swallow.I still would not rule out aspiration when the patient is sedated from polypharmacy. Precautions/instructions were given by the speech therapist. He improved clinically and symptomatically.He remained afebrile. Blood cultures were negative to date. Strep pneumo antigen was negative. HIV serology was negative. He was discharged on 5 more days of Ceftin. Altered mental status/reported delirium. The patient had been frequently falling off to sleep in the middle of a sentence during the hospital course. His wife reported recent nontraumatic falls at home. I believe this is in part due to polypharmacy. And, particularly when his renal function worsens, there is decreased clearance of his opiates and benzodiazepine. This was explained to the patient and his wife, but they were hesitant to decrease the doses. Nevertheless, fentanyl transdermal dosing, when necessary MSIR dosing and gabapentin dosing were decreased during the hospital course. His alertness improved significantly.  He was discharged on his usual dose of transdermal fentanyl, but the frequency of the MSIR was decreased and the dosing of the gabapentin was decreased. Further management will be deferred to his neurologist and pain clinic physician. Frequent falls at home per history. I believe this is mostly from polypharmacy. Physical therapist evaluated him and recommended home health PT which was ordered. Chronic anxiety/depression. Klonopin was initially withheld, but was  restarted (primarily prescribed for myoclonic jerk history) He was continued on bupropion and fluoxetine. Chronic pain syndrome. He is on fentanyl transdermal 100 g chronically. During the hospital course, it was restarted at a lower dose 75 g every 72 hours. MSIR was decreased to twice a day rather than every 6 hours when necessary. Gabapentin was decreased to 200 mg 3 times daily instead of 300-600 mg 3 times daily. Recent history of hypotension/orthostatic hypotension. Per review of his chart, his cardiologist, Dr. Rockey Situ, found that the patient was hypotensive and orthostatic on 03/27/14, so Zaroxolyn, lisinopril, isosorbide nitrate, and carvedilol were discontinued. He was started on gentle IV fluids on admission. His blood pressure was in the 242A systolically on admission and had increased following IV fluid hydration. Carvedilol was restarted, but was eventually discontinued when he became bradycardic. Lasix was restarted at once daily dosing which he appeared to tolerate. Acute kidney injury superimposed on stage III chronic kidney disease.  His baseline creatinine is 2.0-2.3 per chart review. It was 3.11 on admission. Etiology likely from recent history of hypotension/hypoperfusion and prerenal azotemia. He was started on gentle IV fluids. ACE inhibitor was held and continued to be held throughout the hospitalization. His creatinine progressively improved to slightly better than baseline at 1.95. Recommend further monitoring in the outpatient setting. Creatinine has improved to baseline. Obstructive sleep apnea/COPD, on CPAP. The patient reports being on nasal CPAP nightly for obstructive sleep apnea. He denied being on home oxygen. He refused BiPAP and CPAP as he did not tolerate the full mask but rather he is on the nasal CPAP at home. Oxygen saturation on room air was consistently in the 90s. He was instructed to stop smoking. Chronic combined systolic and diastolic congestive heart  failure. It was compensated with no evidence of peripheral edema or pulmonary edema. Lasix was initially held due to hypotension/orthostatic hypotension recently, but was restarted at lower dosing during the hospitalization. Carvedilol was  also restarted, but was discontinued when his heart rate fell to the 40s. ACE inhibitor was held because of acute on chronic renal failure. Recommend follow-up cardiology assessment for medication management. Diabetes mellitus with peripheral neuropathy. CBGs were reasonable during hospitalization on sliding scale NovoLog.  Hemoglobin A C was 9.1. He was discharged on 70/30 insulin with a modified sliding scale. Thyroid mass, seen on CT. Ultrasound revealed left lobe thyromegaly. Per patient, a biopsy a few years ago revealed a benign pathology. TSH and free T4 were within normal limits. Normocytic anemia. Stable and likely chronic. Hemoglobin ranged from 10.5-10.9. Defer further diagnostic workup to the outpatient setting.  Procedures:  None  Consultations:  Pulmonologist, Dr. Luan Pulling  Discharge Exam: Filed Vitals:   04/07/14 1050  BP: 127/71  Pulse: 60  Temp: 98.3 F (36.8 C)  Resp: 16     General: Patient sitting up in the bed, alert and not sleeping or nodding off.  Cardiovascular: S1, S2, with no murmurs rubs or gallops.  Respiratory: Coarse breath sounds, but no audible wheezes or crackles. Breathing is nonlabored.  Abdomen: Positive bowel sounds, soft, nontender, nondistended.  Musculoskeletal: No acute hot red joints. No pedal edema.  Neurologic: Alert and oriented 2. Cranial nerves II through XII are grossly intact.  Discharge Instructions You were cared for by a hospitalist during your hospital stay. If you have any questions about your discharge medications or the care you received while you were in the hospital after you are discharged, you can call the unit and asked to speak with the hospitalist on call if the  hospitalist that took care of you is not available. Once you are discharged, your primary care physician will handle any further medical issues. Please note that NO REFILLS for any discharge medications will be authorized once you are discharged, as it is imperative that you return to your primary care physician (or establish a relationship with a primary care physician if you do not have one) for your aftercare needs so that they can reassess your need for medications and monitor your lab values.  Discharge Instructions    Diet - low sodium heart healthy    Complete by:  As directed      Diet Carb Modified    Complete by:  As directed      Discharge instructions    Complete by:  As directed   1. Recommend decreasing fentanyl patch dosing to 75 mcg to every 3 days. 2. Recommend decreasing Neurontin (gabapentin) to 200 mg 3 times daily. 3. Recommend getting another CT scan of your chest in 3-6 months. If right upper lobe pneumonia is still present, recommend referral to pulmonologist for a bronchoscopy. 4. Lasix restarted at a half a tablet (20 mg) daily. 5. Stop smoking.     Increase activity slowly    Complete by:  As directed           Discharge Medication List as of 04/07/2014 12:40 PM    START taking these medications   Details  albuterol (PROVENTIL HFA;VENTOLIN HFA) 108 (90 BASE) MCG/ACT inhaler Inhale 2 puffs into the lungs every 6 (six) hours as needed for wheezing or shortness of breath., Starting 04/07/2014, Until Discontinued, Print    amoxicillin-clavulanate (AUGMENTIN) 500-125 MG per tablet Take 1 tablet (500 mg total) by mouth 2 (two) times daily with a meal. Antibiotic: Starting tomorrow take 1 tablet twice daily for 5 more days., Starting 04/07/2014, Until Discontinued, Print      CONTINUE these medications  which have CHANGED   Details  furosemide (LASIX) 40 MG tablet Take 0.5 tablets (20 mg total) by mouth daily as needed., Starting 04/07/2014, Until Discontinued, No  Print    gabapentin (NEURONTIN) 300 MG capsule Take 1 capsule (300 mg total) by mouth 3 (three) times daily., Starting 04/07/2014, Until Discontinued, No Print    insulin aspart protamine- aspart (NOVOLOG MIX 70/30) (70-30) 100 UNIT/ML injection Check your blood sugars in the morning and then again in the afternoon and follow the sliding scale: For blood sugar 70-129 give no insulin. For blood sugar 130-150 give 3 units. For blood sugar 151-200 give 5 units. For blood sugar 201-250 give 7 units.  For blood sugar 251-300 give 9 units. For blood sugar 301 to 315 give 12 units. For blood sugar 351-400 give 14 units and then call your doctor., No Print    morphine (MSIR) 15 MG tablet Take 1 tablet (15 mg total) by mouth every 12 (twelve) hours as needed for severe pain., Starting 04/07/2014, Until Discontinued, No Print      CONTINUE these medications which have NOT CHANGED   Details  aspirin EC 81 MG tablet Take 81 mg by mouth daily., Until Discontinued, Historical Med    atorvastatin (LIPITOR) 20 MG tablet Take 1 tablet (20 mg total) by mouth daily., Starting 02/07/2014, Until Discontinued, Print    buPROPion (WELLBUTRIN XL) 300 MG 24 hr tablet Take 1 tablet (300 mg total) by mouth daily., Starting 02/07/2014, Until Discontinued, No Print    clonazePAM (KLONOPIN) 0.5 MG tablet Take 1 tablet (0.5 mg total) by mouth 2 (two) times daily as needed for anxiety., Starting 01/24/2014, Until Discontinued, No Print    FLUoxetine (PROZAC) 20 MG capsule Take 80 mg by mouth daily., Until Discontinued, Historical Med    Melatonin 5 MG TBDP Take 5 mg by mouth at bedtime as needed (for sleep). , Until Discontinued, Historical Med    Multiple Vitamins-Minerals (CENTRUM SILVER ULTRA MENS) TABS Take 1 tablet by mouth daily.  , Until Discontinued, Historical Med    nitroGLYCERIN (NITROSTAT) 0.4 MG SL tablet Place 1 tablet (0.4 mg total) under the tongue every 5 (five) minutes as needed for chest pain., Starting  01/03/2014, Until Discontinued, Print    pantoprazole (PROTONIX) 40 MG tablet Take 1 tablet (40 mg total) by mouth daily., Starting 02/07/2014, Until Discontinued, Print    polyethylene glycol (MIRALAX / GLYCOLAX) packet Take 17 g by mouth daily. For constipation, Until Discontinued, Historical Med    potassium chloride SA (K-DUR,KLOR-CON) 20 MEQ tablet Take 1 tablet (20 mEq total) by mouth daily., Starting 02/07/2014, Until Discontinued, Print    rOPINIRole (REQUIP) 3 MG tablet Take 6 mg by mouth at bedtime., Starting 06/28/2013, Until Discontinued, Historical Med    senna (SENOKOT) 8.6 MG TABS tablet Take 2 tablets by mouth 2 (two) times daily., Until Discontinued, Historical Med    traZODone (DESYREL) 100 MG tablet Take 100 mg by mouth at bedtime as needed for sleep. , Until Discontinued, Historical Med      STOP taking these medications     carvedilol (COREG) 3.125 MG tablet      ondansetron (ZOFRAN-ODT) 4 MG disintegrating tablet        Allergies  Allergen Reactions  . Ms Contin [Morphine Sulfate Er]     Doesn't like to take in liquid form( burns and doesn't help) (pill form works fine)    Follow-up Information    Please follow up.   Why:  Follow-up with  your primary care doctor and your pain doctor in 2-4 weeks.       The results of significant diagnostics from this hospitalization (including imaging, microbiology, ancillary and laboratory) are listed below for reference.    Significant Diagnostic Studies: Ct Abdomen Pelvis Wo Contrast  04/03/2014   CLINICAL DATA:  Generalized pain after multiple falls.  EXAM: CT CHEST, ABDOMEN AND PELVIS WITHOUT CONTRAST  TECHNIQUE: Multidetector CT imaging of the chest, abdomen and pelvis was performed following the standard protocol without IV contrast.  COMPARISON:  CT scan of July 07, 2010.  FINDINGS: CT CHEST FINDINGS  No pneumothorax or pleural effusion is noted. Multiple ill-defined densities with branching nodularity are noted in  the right upper lobe most consistent with focal inflammation. Some associated peribronchial thickening is noted as well. No mediastinal adenopathy is noted. Mild coronary artery calcifications are noted. 5.6 x 2.3 cm mass is seen arising from the inferior portion of the left thyroid lobe. No significant osseous abnormality is noted in the chest. Spinal stimulator lead is seen in thoracic spinal canal.  CT ABDOMEN AND PELVIS FINDINGS  No gallstones are noted. No focal abnormality is noted in the liver, spleen or pancreas on these unenhanced images. Adrenal glands and kidneys appear normal. No hydronephrosis or renal obstruction is noted. No renal or ureteral calculi are noted. Stool is noted throughout the colon suggesting constipation. Urinary bladder appears normal. Atherosclerotic calcifications abdominal aorta are noted without aneurysm formation. No significant adenopathy is noted. No abnormal fluid collection is noted.  IMPRESSION: Multifocal ill-defined opacities are noted with branch shin nodularity in the right upper lobe most consistent with focal inflammation or pneumonia. Followup CT scan in 2-3 weeks is recommended to ensure resolution and rule out underlying nodules or mass.  5.6 x 2.3 cm mass is seen arising from the inferior portion of the left thyroid lobe. Thyroid ultrasound is recommended further evaluation.  Stool is noted throughout the colon suggesting constipation. No other significant abnormality is noted in the abdomen or pelvis.   Electronically Signed   By: Sabino Dick M.D.   On: 04/03/2014 21:04   Dg Chest 2 View  04/06/2014   CLINICAL DATA:  58 year old male with shortness of breath  EXAM: CHEST  2 VIEW  COMPARISON:  Correlation with CT from 04/03/2014  FINDINGS: The cardiac silhouette is upper limits of normal for size. The mediastinal contours are within normal range.  Hazy opacity overlies a right hemithorax, primarily involving the upper lung fields. The left hemithorax appears  well aerated. There is no pleural effusion. There is no pneumothorax. There is no overt pulmonary edema.  Neurostimulator leads and cervical fixation hardware are noted.  IMPRESSION: Diffuse hazy right hemithorax opacity remain suspicious for pneumonitis as described on recent CT.   Electronically Signed   By: Rosemarie Ax   On: 04/06/2014 11:30   Ct Head Wo Contrast  04/03/2014   CLINICAL DATA:  Multiple falls over the past 3 weeks, pain all over, prior neck surgery, history of seizures  EXAM: CT HEAD WITHOUT CONTRAST  CT CERVICAL SPINE WITHOUT CONTRAST  TECHNIQUE: Multidetector CT imaging of the head and cervical spine was performed following the standard protocol without intravenous contrast. Multiplanar CT image reconstructions of the cervical spine were also generated.  COMPARISON:  01/23/2014  FINDINGS: CT HEAD FINDINGS  No evidence of parenchymal hemorrhage or extra-axial fluid collection. No mass lesion, mass effect, or midline shift.  No CT evidence of acute infarction.  Mild intracranial atherosclerosis.  Cerebral volume is within normal limits.  No ventriculomegaly.  The visualized paranasal sinuses are essentially clear. The mastoid air cells are unopacified.  No evidence of calvarial fracture.  CT CERVICAL SPINE FINDINGS  Straightening of the cervical spine.  Status post C4-7 ACDF with corpectomy at C5 and C6.  No evidence of fracture or dislocation. Vertebral body heights are maintained. Dens appears intact.  No prevertebral soft tissue swelling.  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  Visualized lung apices are notable for mild paraseptal emphysematous changes.  IMPRESSION: No evidence of acute intracranial abnormality. Mild intracranial atherosclerosis.  No evidence of traumatic injury to the cervical spine. Stable postsurgical changes at C4-7.   Electronically Signed   By: Julian Hy M.D.   On: 04/03/2014 20:51   Ct Chest Wo Contrast  04/03/2014    CLINICAL DATA:  Generalized pain after multiple falls.  EXAM: CT CHEST, ABDOMEN AND PELVIS WITHOUT CONTRAST  TECHNIQUE: Multidetector CT imaging of the chest, abdomen and pelvis was performed following the standard protocol without IV contrast.  COMPARISON:  CT scan of July 07, 2010.  FINDINGS: CT CHEST FINDINGS  No pneumothorax or pleural effusion is noted. Multiple ill-defined densities with branching nodularity are noted in the right upper lobe most consistent with focal inflammation. Some associated peribronchial thickening is noted as well. No mediastinal adenopathy is noted. Mild coronary artery calcifications are noted. 5.6 x 2.3 cm mass is seen arising from the inferior portion of the left thyroid lobe. No significant osseous abnormality is noted in the chest. Spinal stimulator lead is seen in thoracic spinal canal.  CT ABDOMEN AND PELVIS FINDINGS  No gallstones are noted. No focal abnormality is noted in the liver, spleen or pancreas on these unenhanced images. Adrenal glands and kidneys appear normal. No hydronephrosis or renal obstruction is noted. No renal or ureteral calculi are noted. Stool is noted throughout the colon suggesting constipation. Urinary bladder appears normal. Atherosclerotic calcifications abdominal aorta are noted without aneurysm formation. No significant adenopathy is noted. No abnormal fluid collection is noted.  IMPRESSION: Multifocal ill-defined opacities are noted with branch shin nodularity in the right upper lobe most consistent with focal inflammation or pneumonia. Followup CT scan in 2-3 weeks is recommended to ensure resolution and rule out underlying nodules or mass.  5.6 x 2.3 cm mass is seen arising from the inferior portion of the left thyroid lobe. Thyroid ultrasound is recommended further evaluation.  Stool is noted throughout the colon suggesting constipation. No other significant abnormality is noted in the abdomen or pelvis.   Electronically Signed   By: Sabino Dick M.D.   On: 04/03/2014 21:04   Ct Cervical Spine Wo Contrast  04/03/2014   CLINICAL DATA:  Multiple falls over the past 3 weeks, pain all over, prior neck surgery, history of seizures  EXAM: CT HEAD WITHOUT CONTRAST  CT CERVICAL SPINE WITHOUT CONTRAST  TECHNIQUE: Multidetector CT imaging of the head and cervical spine was performed following the standard protocol without intravenous contrast. Multiplanar CT image reconstructions of the cervical spine were also generated.  COMPARISON:  01/23/2014  FINDINGS: CT HEAD FINDINGS  No evidence of parenchymal hemorrhage or extra-axial fluid collection. No mass lesion, mass effect, or midline shift.  No CT evidence of acute infarction.  Mild intracranial atherosclerosis.  Cerebral volume is within normal limits.  No ventriculomegaly.  The visualized paranasal sinuses are essentially clear. The mastoid air cells are unopacified.  No evidence of calvarial fracture.  CT  CERVICAL SPINE FINDINGS  Straightening of the cervical spine.  Status post C4-7 ACDF with corpectomy at C5 and C6.  No evidence of fracture or dislocation. Vertebral body heights are maintained. Dens appears intact.  No prevertebral soft tissue swelling.  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  Visualized lung apices are notable for mild paraseptal emphysematous changes.  IMPRESSION: No evidence of acute intracranial abnormality. Mild intracranial atherosclerosis.  No evidence of traumatic injury to the cervical spine. Stable postsurgical changes at C4-7.   Electronically Signed   By: Julian Hy M.D.   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 Previous FNA biopsy of inferior left thyroid mass 09/10/2009.  EXAM: THYROID ULTRASOUND  TECHNIQUE: Ultrasound examination of the thyroid gland and adjacent soft tissues was performed.  COMPARISON:  CT 04/03/2014 and earlier studies  FINDINGS:  Right thyroid lobe  Measurements: 45 x 20 x 15 mm.  12 x 8 mm cysts, upper pole.  Left thyroid lobe  Measurements: 84 x 56 x 34 mm. Marked heterogeneity. A large (at least 5.2 cm) slightly hypoechoic solid mass extends inferiorly, incompletely visualized.  Isthmus  Thickness: 11 mm.  No nodules visualized.  Lymphadenopathy  None visualized.  IMPRESSION: 1. Thyromegaly with dominant left inferior pole mass. Correlate with previous biopsy results.   Electronically Signed   By: Arne Cleveland M.D.   On: 04/04/2014 15:46   Dg Swallowing Func-speech Pathology  04/05/2014   Ephraim Hamburger, CCC-SLP     04/05/2014  6:35 PM Objective Swallowing Evaluation: Modified Barium Swallowing Study   Patient Details  Name: RAFFAELE DERISE MRN: [id number] Date of Birth: 08-Feb-1956  Today's Date: 04/05/2014 Time: 1700-1732 SLP Time Calculation (min) (ACUTE ONLY): 32 min  Past Medical History:  Past Medical History  Diagnosis Date  . Type 2 diabetes mellitus   . Essential hypertension, benign   . 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  . CKD (chronic kidney disease) stage 3, GFR 30-59 ml/min   . Arthritis   . Obesity   . Depression   . Sleep apnea   . Diabetic neuropathy   . Coronary atherosclerosis of native coronary artery     Nonobstructive by cardiac catheterization September 2008  . Esophagitis, reflux 07/07/10    MW tear, erosive reflux esophagitis, 2cm hh  . S/P colonoscopy April 2012    Rourk: torturous colon, hyperplastic polyps  . Tremor   . Diverticulum   . Hiatal hernia   . Gastroparesis 11/2010    No emptying at two hours.   . Chronic back pain   . Clonic seizure     Controlled with klonodine  . Acute respiratory failure with hypoxia 06/23/2013    Due to opiate toxicity. Intubated & extubated  . COPD (chronic obstructive pulmonary disease)   . CHF (congestive heart failure)    Past Surgical History:  Past Surgical History  Procedure Laterality Date  . Neck surgery       Discectomy  . Hand surgery      Rght-carpal tunnel  . Colonoscopy  before 2002    [place]  . Appendectomy    . Other surgical history      Bone graft-knee  . Anterior lumbar corpectomy w/ fusion      C5-6/C4-7  . Colonoscopy  08/21/2010    Elongaed tortuous colon otherwise normal  .  Esophagogastroduodenoscopy  07/08/10    Dr. Volney American esophageal erosions and excoriations  consistent with erosive reflux esophagitis, hiatal hernia  . Knee arthroscopy      Right  . Esophagogastroduodenoscopy (egd) with propofol  02/25/2012    Procedure: ESOPHAGOGASTRODUODENOSCOPY (EGD) WITH PROPOFOL;   Surgeon: Daneil Dolin, MD;  Location: [facility];  Service:  Endoscopy;  Laterality: N/A;  7:30 /POLYPHARMACY   HPI:  Mr. Vahan Wadsworth is a 58  y.o. male who presents to the Emergency  Department complaining of multiple falls and gradually worsening,  decreased cognition that started 3 weeks ago. His wife notes that  he has fallen 15-20 times in the last 3 weeks. Pt states repeated  falls that occur 1-5 times each day because of lack of balance.  He states difficulty talking, multiple bruises and a few loose  teeth as associated symptoms. Pt saw cardiologist for orthostatic  hypotension and syncope last week. He was admitted to the  hospital where they held blood pressure medication until  resolved, including Lasix. His wife notes improvement of symptoms  since seeing the cardiologist. Pt also complains that cognition  has decreased without clonazepam use. He stopped refilling  prescription because of financial reasons. His wife states "he  loses cognition out of nowhere" which she describes as losing  control of his mouth. She notes symptoms occur mostly at night,  but sometimes in the middle of the day. Pt states he gets up  frequently at night to use the bathroom and loses his way around  the house. Pt has been wearing a Fentanyl patch for 1 year for  chronic back pain, arthritis and neck pain. He has also lost over  300 lbs over the  last 2 years because of loss of appetite. Pt's  wife states that they are going to live with his daughter and  then enter an assisted living facility. SLP asked to evaluated  swallow function to assess for possible aspiration.     Assessment / Plan / Recommendation Clinical Impression  Dysphagia Diagnosis: Within Functional Limits Clinical impression: SLP evaluated pt at bedside earlier today  and found no signs/symptoms of aspiration, however pt's wife was  insistant that he have a modified barium swallow study. Dr.  Caryn Section requested MBSS. Pt assessed (and challenged) with puree,  thin cup and straw, barium tablet, and regular textures.  Oropharyngeal swallow is essentially [facility] Pt with mild puree  residue in valleculae after initial swallow, which cleared with  spontaneous secondary swallow. Trace residuals in lateral  channels when drinking straw sips thin, which also clear with  spontaneous secondary swallow. Pt instructed to swallow the  barium tablet with thin liquid (placed in his hand), however he  swallowed the pill without liquid and it dropped to the  valleculae. Pt cued to drink liquid and pill cleared; no delay  noted in esophageal transit of pill. During esophageal sweep,  liquids were noted to remain in distal esophagus for ~20 seconds,  but cleared. Recommend regular diet and thin liquids. Pt  encouraged to only consume meals when alert and upright and to  always take pills with a liquid to ensure pharyngeal and  esophageal clearance. Swallow function essentially WNL.    Treatment Recommendation  No treatment recommended at this time    Diet Recommendation Regular;Thin liquid   Liquid Administration via: Cup;Straw Medication Administration: Whole meds with liquid Supervision: Patient able to self feed Postural Changes and/or Swallow Maneuvers: Seated upright 90  degrees;Out of bed for  meals;Upright 30-60 min after meal    Other  Recommendations Oral Care Recommendations: Oral care BID Other  Recommendations: Clarify dietary restrictions   Follow Up Recommendations  None    Frequency and Duration  N/A         General Date of Onset: 04/03/14 HPI: Mr. Kendrik Mcshan is a 58  y.o. male who presents to the  Emergency Department complaining of multiple falls and gradually  worsening, decreased cognition that started 3 weeks ago. His wife  notes that he has fallen 15-20 times in the last 3 weeks. Pt  states repeated falls that occur 1-5 times each day because of  lack of balance. He states difficulty talking, multiple bruises  and a few loose teeth as associated symptoms. Pt saw cardiologist  for orthostatic hypotension and syncope last week. He was  admitted to the hospital where they held blood pressure  medication until resolved, including Lasix. His wife notes  improvement of symptoms since seeing the cardiologist. Pt also  complains that cognition has decreased without clonazepam use. He  stopped refilling prescription because of financial reasons. His  wife states "he loses cognition out of nowhere" which she  describes as losing control of his mouth. She notes symptoms  occur mostly at night, but sometimes in the middle of the day. Pt  states he gets up frequently at night to use the bathroom and  loses his way around the house. Pt has been wearing a Fentanyl  patch for 1 year for chronic back pain, arthritis and neck pain.  He has also lost over 300 lbs over the last 2 years because of  loss of appetite. Pt's wife states that they are going to live  with his daughter and then enter an assisted living facility. SLP  asked to evaluated swallow function to assess for possible  aspiration. Type of Study: Modified Barium Swallowing Study Reason for Referral: Objectively evaluate swallowing function Previous Swallow Assessment: BSE 04/05/2014 reg/thin no f/u;  March 2015 BSE reg/thin Diet Prior to this Study: Regular;Thin liquids Temperature Spikes Noted: No Respiratory Status: Room air History of Recent  Intubation: No Behavior/Cognition: Alert;Cooperative;Pleasant mood;Confused Oral Cavity - Dentition: Adequate natural dentition;Poor  condition Oral Motor / Sensory Function: Within functional limits Self-Feeding Abilities: Able to feed self Patient Positioning: Upright in chair Baseline Vocal Quality: Clear Volitional Cough: Strong Volitional Swallow: Able to elicit Anatomy: Within functional limits (evidence of c-spine surgery) Pharyngeal Secretions: Not observed secondary MBS    Reason for Referral Objectively evaluate swallowing function   Oral Phase Oral Preparation/Oral Phase Oral Phase: WFL   Pharyngeal Phase Pharyngeal Phase Pharyngeal Phase: Impaired Pharyngeal - Thin Pharyngeal - Thin Cup: Within functional limits Pharyngeal - Thin Straw: Lateral channel residue (trace residuals  in lateral channel clear with swallow) Pharyngeal - Solids Pharyngeal - Puree: Within functional limits;Pharyngeal residue -  valleculae (mild residuals in valleculae clear with 2nd swallow) Pharyngeal - Regular: Within functional limits Pharyngeal - Pill:  (pt took without water initially and it  dropped to valleculae)   Cervical Esophageal Phase Cervical Esophageal Phase: [facility]        Thank you,  Genene Churn, [place]  [place] 04/05/2014, 6:34 PM     Microbiology: Recent Results (from the past 240 hour(s))  Culture, blood (routine x 2) Call MD if unable to obtain prior to antibiotics being given     Status: None (Preliminary result)   Collection Time: 04/04/14  1:41 AM  Result Value Ref Range Status  Specimen Description BLOOD LEFT FOREARM  Final   Special Requests BOTTLES DRAWN AEROBIC AND ANAEROBIC 8CC EACH  Final   Culture NO GROWTH 1 DAY  Final   Report Status PENDING  Incomplete  Culture, blood (routine x 2) Call MD if unable to obtain prior to antibiotics being given     Status: None (Preliminary result)   Collection Time: 04/04/14  1:41 AM  Result Value Ref Range Status   Specimen  Description BLOOD RIGHT ANTECUBITAL  Final   Special Requests   Final    BOTTLES DRAWN AEROBIC AND ANAEROBIC AEB=10CC ANA=7CC   Culture NO GROWTH 1 DAY  Final   Report Status PENDING  Incomplete     Labs: Basic Metabolic Panel:  Recent Labs Lab 04/03/14 2059 04/04/14 0141 04/05/14 0557 04/06/14 0555 04/07/14 0547  NA 142 141 146 144 143  K 4.6 4.6 4.4 4.1 4.5  CL 105 105 110 106 105  CO2 26 26 26 27 27   GLUCOSE 95 159* 139* 186* 224*  BUN 39* 37* 26* 24* 22  CREATININE 3.11* 2.95* 2.19* 2.06* 1.95*  CALCIUM 8.9 8.5 8.6 8.7 8.4  MG 2.1  --   --   --   --    Liver Function Tests:  Recent Labs Lab 04/03/14 2059  AST 16  ALT 8  ALKPHOS 74  BILITOT 0.2*  PROT 6.6  ALBUMIN 3.1*   No results for input(s): LIPASE, AMYLASE in the last 168 hours.  Recent Labs Lab 04/03/14 2059  AMMONIA 24   CBC:  Recent Labs Lab 04/03/14 2059 04/04/14 0141 04/05/14 0557 04/06/14 0555 04/07/14 0547  WBC 6.7 7.2 8.1 8.8 8.6  NEUTROABS 4.0  --   --   --   --   HGB 11.1* 10.6* 10.5* 10.8* 10.9*  HCT 34.3* 32.2* 32.5* 33.1* 33.7*  MCV 87.5 86.8 87.4 87.1 87.1  PLT 224 207 205 220 213   Cardiac Enzymes: No results for input(s): CKTOTAL, CKMB, CKMBINDEX, TROPONINI in the last 168 hours. BNP: BNP (last 3 results)  Recent Labs  05/30/13 2025 06/23/13 1602 06/24/13 0542  PROBNP 9807.0* 2103.0* 9222.0*   CBG:  Recent Labs Lab 04/06/14 1639 04/06/14 2146 04/06/14 2241 04/07/14 0733 04/07/14 1114  GLUCAP 257* 54* 117* 193* 143*       Signed:  Tajuana Kniskern  [facility] 04/07/2014, 3:14 PM

## 2014-04-09 LAB — CULTURE, BLOOD (ROUTINE X 2)
CULTURE: NO GROWTH
Culture: NO GROWTH

## 2014-04-09 NOTE — Progress Notes (Signed)
UR chart review completed.  

## 2014-04-30 ENCOUNTER — Ambulatory Visit: Payer: Non-veteran care | Admitting: Cardiovascular Disease

## 2014-04-30 ENCOUNTER — Encounter: Payer: Self-pay | Admitting: *Deleted

## 2014-08-18 NOTE — Consult Note (Signed)
General Aspect Patient is a 59 yo who we are asked to see re abnormal echo. Patietn has a history of NICM  He was last seen by our service in March 2015 when he was admitted at [facility].  He used to be followed at [facility] clinic at [address] [facility] [address], last seen in Dec or Jan 2015.  He is not followed there now and needs to have a f/u cardiologist. The patient had a remote cath and stress test.  He was treated in past for LV thrombus with coumadin  He denies CP but does say he gets L arm discomfort with exertion and with stress (wife)  Relieved with rest.  He says this has been getting worse.  Patient also has a histroy of CKD, HTN, Tob use, chronic pain with substance abuse and sleep apnea   He was admitted on 8/3 with worsening MS, SOB and chest tightness.  Treated initially with IV ABX and did get IV fluids  Became acutely SOB and given IV lasix x 1 with some improvement  He still feels somewaht SOB   Home Medications: Medication Instructions Status  clonazepam 0.5 mg oral tablet 2 tab(s) orally once a day (at bedtime) Active  melatonin 5 mg oral tablet 1 tab(s) orally once a day (at bedtime), As Needed for sleep Active  amlodipine 2.5 mg oral tablet 1 tab(s) orally 2 times a day Active  clonazepam 0.5 mg oral tablet 1 tab(s) orally once a day (in the morning) Active  NovoLOG Mix 70/30 FlexPen 30 units-70 units/mL subcutaneous suspension 22 unit(s) subcutaneous once a day (at bedtime) Active  NovoLOG Mix 70/30 FlexPen 30 units-70 units/mL subcutaneous suspension 14-15 unit(s) subcutaneous once a day (in the morning) Active  fluoxetine 20 mg oral tablet 4 tab(s) orally once a day Active  furosemide 40 mg oral tablet 1 tab(s) orally 2 times a day Active  gabapentin 300 mg oral capsule 1 cap(s) orally 3 times a day Active  nitroglycerin 0.4 mg sublingual tablet 1 tab(s) sublingual every 5 minutes, As Needed - for Chest Pain  Active  pantoprazole 40 mg oral delayed release tablet 1 tab(s) orally once a day  Active  ropinirole 4 mg oral tablet 1 tab(s) orally once a day (at bedtime), As Needed for restless leg syndrome Active  Centrum Silver Therapeutic Multiple Vitamins with Minerals oral tablet 1 tab(s) orally once a day Active  simvastatin 40 mg oral tablet 1 tab(s) orally once a day (at bedtime) Active  docusate sodium 100 mg oral capsule 1 cap(s) orally 2 times a day, As Needed - for Constipation  Active  tizanidine 4 mg oral tablet 1 tab(s) orally 3 times a day Active  trazodone 100 mg oral tablet 1 tab(s) orally once a day (at bedtime), As Needed for sleep Active  Wellbutrin 300 mg/24 hours oral tablet, extended release 1 tab(s) orally once a day Active  fentaNYL 100 mcg/hr transdermal film, extended release 1 patch transdermal every 3 days Active  morphine 15 mg oral tablet 2 tab(s) orally every 6 hours, As Needed - for Pain Active    No Known Allergies:   Case History and Physical Exam:  Chief Complaint Shortness of Breath   Past Medical Health Hypertension   Past Surgical History None   Family History Hypertension  Stroke   HEENT PERLA   Neck/Nodes Supple  JVP is increased to 10  No bruits   Chest/Lungs Clear   Cardiovascular No Murmurs or Gallops  RRR.   Abdomen  Benign  No hepatomegaly   Genitalia WNL   Rectal Not examined   Musculoskeletal No LE edema.  2+ pulses   Skin Warm   Nursing/Ancillary Notes: **Vital Signs.:   09-Aug-15 07:40  Vital Signs Type Q 8hr  Temperature Temperature (F) 98.6  Celsius 37  Temperature Source oral  Pulse Pulse 72  Respirations Respirations 18  Systolic BP Systolic BP 086  Diastolic BP (mmHg) Diastolic BP (mmHg) 69  Mean BP 81  Pulse Ox % Pulse Ox % 93  Pulse Ox Activity Level  At rest  Oxygen Delivery 2L   LabObservation:  08-Aug-15 08:10   OBSERVATION Reason for Test  Routine Micro:  08-Aug-15 10:18   Micro Text Report BLOOD CULTURE   COMMENT                   NO GROWTH IN 18-24 HOURS   ANTIBIOTIC                        Specimen Source #1 left AC  Culture Comment NO GROWTH IN 18-24 HOURS  Result(s) reported on 03 Dec 2013 at 10:00AM.    10:24   Micro Text Report BLOOD CULTURE   COMMENT                   NO GROWTH IN 18-24 HOURS   ANTIBIOTIC                       Specimen Source #2 right AC  Culture Comment NO GROWTH IN 18-24 HOURS  Result(s) reported on 03 Dec 2013 at 10:00AM.    18:31   Micro Text Report URINE CULTURE   COMMENT                   NO GROWTH IN 8-12 HOURS   ANTIBIOTIC                       Specimen Source CLEAN CATCH  Culture Comment NO GROWTH IN 8-12 HOURS  Result(s) reported on 03 Dec 2013 at 10:47AM.  Cardiology:  08-Aug-15 08:10   Echo Doppler REASON FOR EXAM:     COMMENTS:     PROCEDURE: [facility] - ECHO DOPPLER COMPLETE(TRANSTHOR)  - Dec 02 2013  8:10AM   RESULT: Echocardiogram Report  Patient Name:   [name] Bovard Date of Exam: 12/02/2013 Medical Rec #:  [id number]         Custom1: Date of Birth:  11/24/1955       Height:       72.0 in Patient Age:    59 years       Weight:       210.0 lb Patient Gender: M              BSA:          2.18 m??  Indications: CHF Sonographer:    Arville Go RDCS Referring Phys: Max Sane, S  Summary:  1. Left ventricular ejection fraction, by visual estimation, is 25 to  30%.  2. Multiple segmental abnormalities exist. See findings.  3. Severely increased left ventricular internal cavity size.  4. Pseudonormal pattern of LV diastolic filling.  5. Moderately dilated left atrium.  6. Mild aortic valve sclerosis without stenosis.  7. Mildly increased left ventricular posterior wall thickness.  8. LVEF is severely depressed There is an echobright region at apex. May  represent coarse trabeculation. Cannot  exclude thrombus WOuld order  limted imaging with contrast to evaluate further. 2D AND M-MODE MEASUREMENTS (normal ranges within parentheses): Left Ventricle:          Normal IVSd (2D):      1.03 cm (0.7-1.1) LVPWd (2D):    1.28  cm (0.7-1.1) Aorta/LA:                  Normal LVIDd (2D):     6.60 cm (3.4-5.7) Aortic Root (2D): 3.00 cm (2.4-3.7) LVIDs (2D):     5.20 cm           Left Atrium (2D): 4.70 cm (1.9-4.0) LV FS (2D):     21.2 %   (>25%) LV EF (2D):     42.1 %   (>50%)                                   Right Ventricle:                                   RVd (2D): LV DIASTOLIC FUNCTION: MV Peak E: 0.61 m/s Decel Time: 222 msec MV Peak A: 0.50 m/s E/A Ratio: 1.21 SPECTRAL DOPPLER ANALYSIS (where applicable): Mitral Valve: MV P1/2 Time: 64.38 msec MV Area, PHT: 3.42 cm?? Aortic Valve: AoV Max Vel: 1.50 m/s AoV Peak PG: 9.0 mmHg AoV Mean PG: LVOT Vmax: 1.25 m/s LVOT VTI:  LVOT Diameter: 2.00 cm AoV Area, Vmax: 2.62 cm?? AoV Area, VTI:  AoV Area, Vmn: Pulmonic Valve: PV Max Velocity: 1.07 m/s PV Max PG: 4.6 mmHg PV Mean PG:  PHYSICIAN INTERPRETATION: Left Ventricle: The left ventricular internal cavity size was severely  increased. LV septal wall thickness was normal. LV posterior wall  thickness was mildly increased. Left ventricular ejection fraction, by  visual estimation, is 25 to 30%. Spectral Doppler shows pseudonormal  pattern of LV diastolic filling. LV Wall Scoring: The entire inferior wall is akinetic. The entire septum, mid and apical anterior wall, mid inferolateral segment, mid anterolateral segment,  apical lateral segment, and apex are hypokinetic. All remaining scored segments  are normal. Right Ventricle: The right ventricular size is normal. Global RV systolic  function is normal. Left Atrium: The left atrium is moderately dilated. Right Atrium: The right atrium is normal in size. Mitral Valve: The mitral valve is normal in structure. No evidence of  mitral valve regurgitation is seen. Aortic Valve: Mild aortic valve sclerosis is present, with no evidence of  aortic valve stenosis. No evidence of aortic valve regurgitation is seen. Pulmonic Valve: The pulmonic valve is not well  seen. No indication of  pulmonic valve regurgitation.  [id number] Dorris Carnes MD Electronically signed by [id number] Dorris Carnes MD Signature Date/Time: 12/02/2013/1:58:20 PM  *** Final ***  IMPRESSION: .    Verified By: Fay Records, M.D., MD  Routine Chem:  08-Aug-15 05:32   Glucose, Serum  138  BUN  33  Creatinine (comp)  2.41  Sodium, Serum 138  Potassium, Serum 3.8  Chloride, Serum 101  CO2, Serum 31  Calcium (Total), Serum 9.2  Anion Gap  6  Osmolality (calc) 285  eGFR (African American)  33  eGFR (Non-African American)  28 (eGFR values <28m/min/1.73 m2 may be an indication of chronic kidney disease (CKD). Calculated eGFR is useful in patients with stable renal function. The eGFR calculation will not be  reliable in acutely ill patients when serum creatinine is changing rapidly. It is not useful in  patients on dialysis. The eGFR calculation may not be applicable to patients at the low and high extremes of body sizes, pregnant women, and vegetarians.)   XRay:    05-Aug-15 22:16, Chest Portable Single View  Chest Portable Single View   REASON FOR EXAM:    SOB  COMMENTS:       PROCEDURE: DXR - DXR PORTABLE CHEST SINGLE VIEW  - Nov 29 2013 10:16PM     CLINICAL DATA:  Shortness of breath    EXAM:  PORTABLE CHEST - 1 VIEW    COMPARISON:  11/28/2013    FINDINGS:  Mild cardiomegaly whichis stable from previous. Unchanged aortic  contours. Pulmonary venous congestion and diffuse interstitial  coarsening which is new from previous [employer] present. No  asymmetric opacity. No effusion or pneumothorax. Thoracic dorsal  column stimulator.     IMPRESSION:  CHF.      Electronically Signed    By: Jorje Guild M.D.    On: 11/29/2013 22:18         Verified By: Gilford Silvius, M.D.,  Cardiology:    08-Aug-15 08:10, Echo Doppler  Echo Doppler   REASON FOR EXAM:      COMMENTS:       PROCEDURE: [facility] - ECHO DOPPLER COMPLETE(TRANSTHOR)  - Dec 02 2013  8:10AM      RESULT: Echocardiogram Report    Patient Name:   [name] Kalman Date of Exam: 12/02/2013  Medical Rec #:  [id number]         Custom1:  Date of Birth:  1955/09/23       Height:       72.0 in  Patient Age:    51 years       Weight:       210.0 lb  Patient Gender: M              BSA:          2.18 m??    Indications: CHF  Sonographer:    Arville Go RDCS  Referring Phys: Max Sane, S    Summary:   1. Left ventricular ejection fraction, by visual estimation, is 25 to   30%.   2. Multiple segmental abnormalities exist. See findings.   3. Severely increased left ventricular internal cavity size.   4. Pseudonormal pattern of LV diastolic filling.   5. Moderately dilated left atrium.   6. Mild aortic valve sclerosis without stenosis.   7. Mildly increased left ventricular posterior wall thickness.   8. LVEF is severely depressed There is an echobright region at apex. May   represent coarse trabeculation. Cannot exclude thrombus WOuld order   limted imaging with contrast to evaluate further.  2D AND M-MODE MEASUREMENTS (normal ranges within parentheses):  Left Ventricle:          Normal  IVSd (2D):      1.03 cm (0.7-1.1)  LVPWd (2D):    1.28 cm (0.7-1.1) Aorta/LA:                  Normal  LVIDd (2D):     6.60 cm (3.4-5.7) Aortic Root (2D): 3.00 cm (2.4-3.7)  LVIDs (2D):     5.20 cm           Left Atrium (2D): 4.70 cm (1.9-4.0)  LV FS (2D):     21.2 %   (>25%)  LV  EF (2D):     42.1 %   (>50%)                                    Right Ventricle:                                    RVd (2D):  LV DIASTOLIC FUNCTION:  MV Peak E: 0.61 m/s Decel Time: 222 msec  MV Peak A: 0.50 m/s  E/A Ratio: 1.21  SPECTRAL DOPPLER ANALYSIS (where applicable):  Mitral Valve:  MV P1/2 Time: 64.38 msec  MV Area, PHT: 3.42 cm??  Aortic Valve: AoV Max Vel: 1.50 m/s AoV Peak PG: 9.0 mmHg AoV Mean PG:  LVOT Vmax: 1.25 m/s LVOT VTI:  LVOT Diameter: 2.00 cm  AoV Area, Vmax: 2.62 cm?? AoV Area, VTI:  AoV Area,  Vmn:  Pulmonic Valve:  PV Max Velocity: 1.07 m/s PV Max PG: 4.6 mmHg PV Mean PG:    PHYSICIAN INTERPRETATION:  Left Ventricle: The left ventricular internal cavity size was severely   increased. LV septal wall thickness was normal. LV posterior wall   thickness was mildly increased. Left ventricular ejection fraction, by   visual estimation, is 25 to 30%. Spectral Doppler shows pseudonormal   pattern of LV diastolic filling.  LV Wall Scoring:  The entire inferior wall is akinetic. The entire septum, mid and apical  anterior wall, mid inferolateral segment, mid anterolateral segment,   apical  lateral segment, and apex are hypokinetic. All remaining scored segments   are  normal.  Right Ventricle: The right ventricular size is normal. Global RV systolic   function is normal.  Left Atrium: The left atrium is moderately dilated.  Right Atrium: The right atrium is normal in size.  Mitral Valve: The mitral valve is normal in structure. No evidence of   mitral valve regurgitation is seen.  Aortic Valve: Mild aortic valve sclerosis is present, with no evidence of   aortic valve stenosis. No evidence of aortic valve regurgitation is seen.  Pulmonic Valve: The pulmonic valve is not well seen. No indication of   pulmonic valve regurgitation.    [id number] Dorris Carnes MD  Electronically signed by [id number] Dorris Carnes MD  Signature Date/Time: 12/02/2013/1:58:20 PM    *** Final ***    IMPRESSION: .        Verified By: Fay Records, M.D., MD    Impression Patient is a 59 yo with known history of CHF wit hsevere LV dysfunction  At one point with LV thrombus.  Cath was done at [facility] (need to find records for).   Patient admitted on 8/3  Had noted episodes of arm discomfort with exertion prior to admit, exertional and with stress.  Here, in hosp, developed acute SOB that was helped some by IV lasix. On exam, patietn has elevated JVP  Otherwise lungs CTA EKG with SR 63 with Q wave AVL  Poss  anteror MI Echo with severe LV dysfunction, cannot exclude LV thrombus.  I would recomm 1.  Limited echo with IV echocontrast to evaluate apex better.  Patient woudl not be a great candidate for anticoagulation with history of pain med use. 2. Check BNP  On exam his volume may be up some  Limited by renal function 3.  With L arm pain would recomm Lexiscan myoview  to r/o large area of ischemia.  Any invasive evalaution would risk possibe dialysis.   Electronic Signatures: Dorris Carnes (MD)  (Signed 09-Aug-15 14:55)  Authored: General Aspect/Present Illness, Home Medications, Allergies, History and Physical Exam, Vital Signs, Labs, Radiology, Impression/Plan   Last Updated: 09-Aug-15 14:55 by Dorris Carnes (MD)

## 2014-08-18 NOTE — Consult Note (Signed)
Reason for Consultation: Acute Kidney Injury on Chronic Kidney Disease of Consultation 11/29/2013 Physician: Dr. Posey Pronto 59yo M with CKD, chronic pain, HTN who is presented to [facility] 8/3 and was admitted for confusion, weakness, dyspnea and found to have LLL infiltrate on CXR treated as pneumonia. He also had some left arm pain and chest tightness and he was ruled out for MI. BP on presentation low - 90/60s. He was lethargic. His sedating medications were held and his pneumonia was treated with rocephic and azithromycin, IVF. The following day he was feeling better but also c/o N/V/D which he tells me today were present in the days leading up to the admission. He denied fevers, chills, cough except chronic related to CPAP at home and [place] O2 here. Creatinine on presentation was 3.55 and has improved to 2.42 today. He has been followed sporadically in [facility] clinic by Dr. Juanito Doom. His last visit was 07/2011 at which time Cr 2 and prior 2011 when Cr was 2.42.he's feeling improved but c/o exacerbation of chronic pain and would like meds resumed. UOP has been good and he denies dysuria, hematuria, edema, incomplete void, orthopnea, chest pain. He's lost 250lbs over the year due to chronic GI problems which is presumed gastroparesis. His gabapentin dose was reduced from 300 5/day to 300 TID a few months ago and he noted no improvement in chronic fatigue and bit worse pain.  12 system ROS negative except per the HPI above 3b BL creatinine ~2-2.57moclonic tremors and muscle spasmspain [place] HISTORY: He smokes 1 pack of cigarettes daily; no history of alcohol, drug abuse; he is married and lives with his wife.  HISTORY: Mother with multiple problems including substance abuse, psychiatric illness, CVA. Father with hypertension and CVA. in hospital: amlodipine 2.5, wellbutrin 300, ceftriaxone, azithromycin, fluoxetine 80, gabapentin 300 TID,  novolog 70/30, melatonin 388mdaily, MVI, protonix 40 daily, requip 4 qhs, simvastatin 40 daily, trazodone prn, klonopin 0.5/1, senna, diet 2g sodium MEDICATIONS, home: notable differences: Lasix 40 BID, amlodipine 2.5 BID  NKDA  PHYSICAL EXAM VS 24h Tm 98.2 HR 60s BP 108-164/64-84 O2 sat 92-97% on 2L [place], 1 doc 79% with exertion I/Os: for the hospitalization 4.8 / 5.6- obese and tired but nontoxic- anicteric- MMM-no JVD- RRR- clear- obese, soft, nontender- no edema- no foley- unsteady gait in room- normal mood and affect Na 146, K 3.4, Cl 110, Bicarb 27, BUN 42, Cr 2.42, eGFR 28 or 33, GLuc 74, Ca 8.2WBC 6.9 > H/H 11.4 / 34.8 < Plt 170, MCV 88Cr 3.46, Hb 11.6Cr 3.551340, blood cultures NGTD, troponin negative1.009, 5, neg protein, blood, 6 WBC/hpf, 5 hyaline casts/LPF, 3 RBC/hpfCXR: IMPRESSION:No pulmonary edema. Streaky left base atelectasis or residual infiltrate with slight improvement in aeration.CXR: IMPRESSION: Patchy infiltrate left base. Lungs otherwise clear. Stimulator present. No pneumothorax.Renal [place]: Right Kidney: Length: 12.7 cm. Echogenicity within normal limits. No mass orvisualized. Left Kidney: Length: 11.8 cm. Echogenicity within normal limits. No mass or hydronephrosis visualized. Bladder: Appears normal for degree of bladder distention.  and Plan:  51M with CKD who is seen for AKI during hospitalization. on CKD 3b: Renal function back to what I expect is his baseline based on the historic data available with Cr 2.42 in 2011 and 2 in 2013. Acute insult seemed to be hypovolemia related to GI symptoms. Stop IVF now as he's taking normal oral intake and as a test to see if he can maintain hydration before discharge. BPs up  now but I would continue to hold diuretic until we know he can maintain po intake Amlodipine has been resumed but at half home dose (was taking 2.5 BID at home), increase if BPs remain up or at discharge. D/C IVF but hold diuretic for now, but I suspect Lasix  should be resumed tomorrow to maintain euvolemia. CTM CHF: euvolemic, was hypovolemic on admission. Stop IVF now and resume Lasix either tomorrow or at discharge to maintain euvolemia. 2g sodium diet.   No indication for epogen.  you for involving me in this patient's care. Please feel free to contact me with any questions or concerns. From my perspective it's ok to discharge him if he's taking good po intake. I'll arrange f/u in my clinic for 3 months from now. Jannifer Hick MD pager [phone]   Electronic Signatures: Justin Mend (MD)  (Signed on 05-Aug-15 12:34)  Authored  Last Updated: 05-Aug-15 12:34 by Justin Mend (MD)

## 2014-08-18 NOTE — H&P (Signed)
PATIENT NAME:  [name], BONANNO MR#:  [id number] DATE OF BIRTH:  01/27/1956  DATE OF ADMISSION:  11/27/2013  PRIMARY CARE PHYSICIAN:  Grace Bushy, MD, with [facility] in [place].   NEPHROLOGIST:  Dr. Juanito Doom, at [facility].   PAIN SPECIALIST: At [facility].    REQUESTING PHYSICIAN:  Dr Conni Slipper    CHIEF COMPLAINT:  Confusion, weakness, shortness of breath, and left arm pain.   HISTORY OF PRESENT ILLNESS: The patient is a 59 year old male with a known history of CKD stage III, chronic pain syndrome, and hypertension. He is being admitted for metabolic encephalopathy. He was also found to have a left lower lobe infiltrate, along with worsening renal function and possible angina.   The patient has been getting more and more confused over the last couple of days per his wife. He started having some left arm pain since yesterday along with chest tightness and was not relieved by albuterol metered dose inhaler; neither nebulizer and wife started giving him nitroglycerin which seemed to help him. Wife claims that he was getting somewhat more oriented getting nitroglycerin. His left arm pain is gone, but he still has some chest tightness, which is slowly improving. The patient seems very somnolent while I am talking and most of the conversation was with his wife. When I evaluated him, his pressure was 97/81. He is being admitted for further evaluation and management.   The patient also reports some mild shortness of breath associated with this confusion and chest tightness.   PAST MEDICAL HISTORY: 1.  CKD stage III.  2.  History of myoclonic tremors.  3.  Hypertension.  4.  Depression.  5.  Chronic pain syndrome.  6.  History of CHF.  7.  Edema.  8.  Angina.  9.  Restless leg syndrome.  10.  Muscles spasm.  11.  Neuropathy.  12.  Diabetes.  12.  Hyperlipidemia.  13.  GERD.  14.  Gastric motility disorder.   PAST SURGICAL HISTORY:  1.  Neck surgery.  2.   Appendectomy.  3.  Knee arthroscopy.   SOCIAL HISTORY:  He smokes 1 pack of cigarettes daily; no history of alcohol, drug abuse; he is married and lives with his wife.   FAMILY HISTORY: Mother with multiple problems, she was a drug abuser, also had a psychiatry problem; she has had CVA; father with hypertension and CVA.    MEDICATIONS:  At home:  1.  Amlodipine 2.5 mg p.o. b.i.d.  2.  Centrum Silver once daily.  3.  Clonazepam 0.5 mg p.o. every morning and 0.5 mg 2 tablets p.o. at bedtime.  4.  Colace 100 mg p.o. b.i.d. as needed.  5.  Fentanyl 100 mcg patch transdermal every 3 days.  6.  Paroxetine 20 mg 4 tablets p.o. daily.  7.  Lasix 40 mg p.o. b.i.d.  8.  Gabapentin 300 mg p.o. 3 times a day.  9.  Melatonin 5 mg p.o. at bedtime as needed.  10.  Morphine 15 mg 4 tabs p.o. every 6 hours as needed.  11.  Nitroglycerin 0.4 mg sublingual every 5 minutes as needed.  12.  NovoLog insulin 70/30 of 14 to 15 units subcutaneous every morning and 22 units every night.  13.  Protonix 40 mg p.o. daily.  14.  Ropinirole 4 mg p.o. at bedtime as needed.  15.  Simvastatin 40 mg p.o. at bedtime.  16.  Tizanidine 4 mg p.o. 3 times a day.  17.  Trazodone 100 mg p.o. at bedtime.  18.  Wellbutrin 300 mg p.o. daily.   ALLERGIES: No known drug allergies.   REVIEW OF SYSTEMS: The patient is very drowsy, somnolent, and unable to provide any history.   PHYSICAL EXAMINATION: VITAL SIGNS: Temperature 98.7, heart rate 63 per minute; respirations 20 per minute; blood pressure 97/81 mmHg, saturating 95% on room air.  GENERAL: The patient is a 59 year old male lying in the bed, somewhat somnolent without any acute distress.  EYES: Pupils equal, round and reactive to light and accommodation, no scleral icterus, extraocular muscles intact.  HEENT: Head atraumatic, normocephalic; oropharynx and nasopharynx dry and clear.  NECK: Supple. No jugular venous distention. No thyroid enlargement or tenderness.    LUNGS: Clear to auscultation bilaterally. No wheezing, rales, rhonchi, or crepitation.  CARDIOVASCULAR: S1, S2 normal. No murmurs, rubs, or gallops.  ABDOMEN: Soft, nontender, nondistended. Bowel sounds present. No organomegaly or mass.  EXTREMITIES: No pedal edema, cyanosis, or clubbing.  NEUROLOGIC: Cranial nerves III through XII intact. Muscle strength 4 out of 5, in all extremities, sensation intact. He is lethargic.  PSYCHIATRY: Unable to evaluate. He seems somewhat somnolent.  SKIN: No obvious rash, lesion or ulcer.   LABORATORY PANEL:  Normal CBC except hemoglobin 11.7, hematocrit 35.3; normal BMP, except BUN of 56, creatinine 3.55; normal liver function tests, normal first set of troponins; negative urinalysis.   Chest x-ray in the ED, showed left lower lobe patchy infiltrate, no pneumothorax.   EKG showed normal sinus rhythm; flipped T waves inferior laterally, compared with previous EKG.   IMPRESSION AND PLAN: 1.  Metabolic encephalopathy likely due to polypharmacy and dehydration. He was quite lethargic and somnolent here in the Emergency Department. We will hold off any kind of benzodiazepine and narcotics at this time. I have discussed this with him and his wife, they are agreeable with that.  2.  Pneumonia left lower lobe. We will continue Rocephin and Zithromax.  3.  Acute on chronic kidney disease stage III, likely prerenal. We will avoid any nephrotoxins.  4.  Unstable angina. We will start him on aspirin along with nitroglycerin, rule out with serial troponins.  5.  Weakness, likely multifactorial. We will get physical therapy evaluation and management; hold off any narcotics and benzodiazepines at this time.  6.  Code status.  Full code.   TOTAL TIME TAKING CARE OF THIS PATIENT: 63.    ____________________________ Jerick Khachatryan S. Manuella Ghazi, MD vss:nt D: 11/27/2013 19:16:17 ET T: 11/27/2013 20:09:50 ET JOB#: [id number]  cc: Aja Bolander S. Manuella Ghazi, MD, <Dictator> Sharee Pimple,  MD  Remer Macho MD ELECTRONICALLY SIGNED 11/28/2013 17:37

## 2014-08-18 NOTE — Discharge Summary (Signed)
PATIENT NAME:  [name], [name] MR#:  [id number] DATE OF BIRTH:  1956/03/25  DATE OF ADMISSION:  11/27/2013 DATE OF DISCHARGE:  12/06/2013  DISCHARGE DIAGNOSES:  1.  Chest pain secondary to pneumonia.  2.  Acute on chronic renal failure.  3.  Acute on chronic systolic heart failure. 4.  Type 2 diabetes.  5.  Hypertension.  6.  Chronic back pain. 7.  Anxiety.  8.  Metabolic encephalopathy due to pneumonia, resolved.  DISCHARGE MEDICATIONS:  1.  Melatonin 5 mg daily.  2.  Amlodipine 2.7 p.o. b.i.d., 3.  Klonopin 0.5 mg p.o. daily. 4.  NovoLog Mix 70/30 fourteen to 15 units once a day in the morning.  5.  Lasix 14 mg p.o. b.i.d.  6.  Pantoprazole 40 mg p.o. daily.  7.  Simvastatin 40 mg p.o. at bedtime.  8.  Colace 100 mg p.o. b.i.d.  9.  Fentanyl 100 mcg every 3 days.  10.  Neurontin 300 mg p.o. t.i.d.  11.  Wellbutrin 300 mg every day.  12.  Klonopin 0.5 mg 2 tablets once a day.  13.  NovoLog mix 70/30 twenty-two units at bedtime. 14.  Morphine 50 mg every 8 hours, which is morphine IR.  15.  Fluoxetine 40 mg p.o. daily.  16.  Ropinirole 4 mg daily.  17.  Trazodone 100 mg p.o. at bedtime.  18.  Imdur 15 mg daily.  19.  Coreg 3.125 mg p.o. b.i.d.   Discharged home with home physical therapy, nurse aide, home health aide and registered nurse.   CONSULTATIONS:  1.  Cardiology consult with Dr. Fletcher Anon. 2.  [facility] neph[facility].   HOSPITAL COURSE:   1.  Pneumonia. The patient is a 59 year old  male patient who came in because of shortness of breath, confusion, and left arm pain. Look at the history and physical for full details. The patient's past medical history is significant for chronic pain syndrome, CKD history, and also hypertension.   2.  This patient had shortness of breath and chest pain. Chest x-ray showed left lower lobe pneumonia on admission. The patient started on Rocephin and Zithromax, along with IV fluids. The patient's pneumonia improved, and he finished a course of   rocpehin and zithromax.   3.  Acute on chronic kidney, stage III.  The patient's kidney function was abnormal and his creatinine was 3.5 and BUN 56 on admission. The patient received IV fluids and we held his Lasix and amlodipine. The patient also was hypotensive with blood pressure of 97/81 on admission. The patient was seen by [facility] nephrology. They recommended no further work-up.  They suggested that patient's baseline kidney function is around 2.4 from previous office visits and they suggested to restart IV Lasix and stop the fluids. The patient's kidney function improved and his renal failure thought to be secondary to her nausea, vomiting, diarrhea or GI illness, but he had before the admission.    4.  Hypotension also improved with the fluids and holding the medications like Lasix and Norvasc. The patient's IV fluids were stopped and his ultrasound of the kidney did not show any hydronephroses and it showed only rate was within normal limits. The patient's renal function improved and at the time of discharge, it was 2.61.   5.  Acute on chronic systolic heart failure. The patient developed shortness of breath. We repeat a chest x-ray again on August 5, which showed congestive heart failure. The patient was given IV Lasix and we did an ABG which  showed pH 7.45 pCO2 of 42, pO2 of 56 and he was continued on oxygen. The patient's shortness of breath improved with the Lasix and he had an echocardiogram which showed ejection fraction only 25%- 30% with severely increased LV in the internal cavity, and also had valvular thrombus in the apical region. So we got the cardiology consult.  Patient was seen by Dr. Fletcher Anon and his partner and they suggested that the patient needs a limited echocardiogram and the patient had a limited echocardiogram with the contrast to focus on the apex, which did not show any apical thrombus and it showed ejection fraction of 25% -30%. The patient had troponins which were normal on  admission, but because of  poor EF, he had a (lexiscan stress test which showed   with multiple wall motion abnormalities.EF was 34%.  The patient was seen by cardiology, Dr. Fletcher Anon, and also congestive heart failure clinic team. The patient had an appointment with congestive heart failure clinic on August 13, but Dr. Fletcher Anon mentioned because of his chronic kidney disease and poor EF, he is not a candidate for angiogram and the patient advised to have medical management for his nonischemic cardiomyopathy. The patient was continued on Coreg and the patient was not given ACE  or ARBS because of CKD. The patient was continued on nitrates for afterload reduction. The patient was continued on Coreg and Imdur, and the patient will see Dr. Esmond Plants and Lasix was given at 40 twice daily and he is advised to follow up with them for further adjustment of medication. The patient also has diabetes mellitus, type 2.  He is on NovoLog 70/30, and the dose is as described in the discharge medications.   6.  Chronic pain syndrome with a history of back surgeries. The patient is on morphine and also fentanyl patch. The patient has a pain management specialist, but he goes to [facility] and we advised him to follow up with them.   7.  Anxiety.  He is on Klonopin and Neurontin and Wellbutrin.  When he came, he was lethargic and his anxiety medications were held and we started them slowly. I advised him to be careful and we have stopped the Tizanidine and we continued the other medications trazodone, (welbutrin,> and Klonopin. At the time of discharge, his mental status was completely normal.  He was alert and oriented x 3. The patient did not qualify for home oxygen. His oxygen saturation about 90% on room air at rest and also on exertion.  So we discharged him home with home health R.N. and physical therapy.   TIME SPENT ON DISCHARGE PREPARATION: More than 30 minutes.   The patient has appointments to see Dr. Fletcher Anon and also [facility]  nephrology.    ____________________________ Epifanio Lesches, MD sk:TT D: 12/15/2013 18:02:51 ET T: 12/15/2013 19:24:26 ET JOB#: [id number]  cc: Epifanio Lesches, MD, <Dictator> Epifanio Lesches MD ELECTRONICALLY SIGNED 12/18/2013 13:50

## 2014-08-19 NOTE — Discharge Summary (Signed)
PATIENT NAMEBALDWIN, [name] MR#:  [id number] DATE OF BIRTH:  29-Sep-1955  DATE OF ADMISSION:  09/09/2011 DATE OF DISCHARGE:  09/10/2011  ADMISSION DIAGNOSIS: Encephalopathy.  DISCHARGE DIAGNOSES:  1. Encephalopathy secondary to polypharmacy. 2. Hypoglycemia, resolved.  3. Depression.  4. Chronic pain syndrome.   DISCHARGE LABORATORY DATA: Sodium 144, potassium 4.6, chloride 111, bicarbonate 27, BUN 28, creatinine 1.99, glucose 227, and magnesium 2.2.   HOSPITAL COURSE: This is a 59 year old male who presented with confusion and encephalopathy. For further details, please refer to Dr. Kelton Pillar history and physical.  1. Encephalopathy secondary to polypharmacy. The patient was on Zanaflex and oxycodone which probably caused his symptoms. He is back to his baseline. I did call the patient's pain clinic and relayed the message that he had an adverse reaction to this.  2. Hypoglycemia, resolved.  3. Depression. The patient will continue his outpatient medications.  4. Chronic pain syndrome. The patient will continue his outpatient medications consisting of Zanaflex and we will see his pain clinic physician in about two weeks.   DISCHARGE MEDICATIONS:  1. Lasix 40 mg twice a day. 2. Venlafaxine 100 mg four times daily. 3. NitroQuick 0.4 mg p.r.n. chest pain.  4. Oxycodone 30 mg every 4 to 6 hours p.r.n.  5. Requip 4 mg daily.  6. Trazodone 100 mg daily.  7. Neurontin 400 mg three times daily. 8. Fluoxetine 20 mg three times daily. 9. Spironolactone 25 mg four times daily. 10. Clonazepam 0.5 mg 2 tablets at bedtime.  11. Clonazepam 0.5 mg daily.  12. Simvastatin 40 mg daily.  13. Tizanidine 4 mg three times daily. 14. Pantoprazole 40 mg twice a day. 15. Keppra 500 mg twice a day. 16. Norvasc 2.5 mg twice a day. 17. Zofran 8 mg 1/2 tablet every eight hours.  18. Melatonin 5 mg daily.  19. Mucinex 2 tablets at bedtime p.r.n.  20. Flex-Pen 70/30,  55 units in the morning.  21. Flex-Pen  70/30, 100 units in the evening.  22. Stool softener 100 mg daily.  23. Senior Complete vitamin daily.   NOTE: Do not take Zanaflex.  DISCHARGE DIET: ADA diet.   DISCHARGE ACTIVITY: As tolerated.   DISCHARGE FOLLOWUP: Followup with [facility] in 1 to 2 weeks.  TIME SPENT: Approximately 35 minutes. ____________________________ Donell Beers. Benjie Karvonen, MD spm:slb D: 09/10/2011 12:04:57 ET T: 09/10/2011 12:33:45 ET JOB#: [id number]  cc: Naliya Gish P. Benjie Karvonen, MD, <Dictator> Aniceto Kyser P Neely Kammerer MD ELECTRONICALLY SIGNED 09/10/2011 21:08

## 2014-08-19 NOTE — H&P (Signed)
PATIENT NAME:  [name], [name] MR#:  [id number] DATE OF BIRTH:  08/23/1955  DATE OF ADMISSION:  09/09/2011  REFERRING PHYSICIAN: ER physician, Dr. Robet Leu PRIMARY CARE PHYSICIAN: Dr. Edwina Barth with [facility] in [place], phone number [phone].  NEPHROLOGIST: Dr. Karalee Height, [phone]; patient also goes to [facility] clinic in [place] and a pain specialist.    CHIEF COMPLAINT: Altered mental.   HISTORY OF PRESENT ILLNESS: Patient is currently very drowsy. He does not provide any information. 34 of information is obtained from the patient's daughter, wife and the ER physician. Patient is a 59 year old male with past medical history of depression, chronic pain syndrome, hypertension, congestive heart failure, unclear whether it is systolic or diastolic who presents with altered mental status. Patient is on multiple sedating medications including Effexor, oxycodone, Requip, trazodone, baclofen, Neurontin, Klonopin, Zanaflex. As per the patient's wife patient ran out of his oxycodone about a week ago and just restarted taking it yesterday and took a dose last night. He saw his pain specialist about six days ago and was started on Zanaflex which he has been taking t.i.d. Wilburn Mylar was the first time that he took the oxycodone and the Zanaflex together. When he woke up this morning he was very lethargic, not acting right, confused, asking for his daughter who has passed away in 08/10/1993. Patient is also a diabetic and he took his scheduled insulin. The patient's wife was able to make him eat a little bit for breakfast but since then he has been altered therefore he was brought to the hospital. Patient was given two doses of Narcan in the Emergency Room but did not have a significant improvement in his mental status. As previously mentioned Zanaflex was just started about six days ago and yesterday was the first time that patient took Zanaflex and oxycodone together. Review of the medication Zanaflex  revealed that it has to be used with caution in patients with renal insufficiency; in those patients it can cause hypotension, increased drowsiness and psychomotor abnormalities. Patient has chronic kidney disease and follows up with a nephrologist. Patient's family is requesting that patient's pain medication physician be personally informed of the reaction to Zanaflex so that it can be discontinued.   ALLERGIES: No known drug allergies.    PAST MEDICAL HISTORY:  1. Chronic kidney disease stage III. Patient follows up with Dr. Karalee Height at [facility] 2. History of myoclonic tremors. 3. Hypertension. 4. Depression. 5. Chronic pain syndrome. 6. Heart disease. 7. Congestive heart failure. 8. Edema. 9. Angina. 10. Restless leg syndrome.  11. Muscle spasms. 12. Neuropathy. 13. Diabetes. 14. Hyperlipidemia. 15. Gastroesophageal reflux disease. 16. Gastric motility disorder.   PAST SURGICAL HISTORY:  1. Neck surgery.  2. Appendectomy.  3. Knee arthroscopy.   SOCIAL HISTORY: Patient smokes 1 pack per day. There is no history of alcohol or drug abuse. He is married and lives with his wife.   FAMILY HISTORY: Mother had multiple problems. She was a drug abuser and had psychiatric problems. She also had cerebrovascular accident. Father had hypertension and cerebrovascular accident.   MEDICATIONS: 1. Lasix 40 mg b.i.d.  2. Effexor 100 mg daily.  3. Nitroglycerin p.r.n.  4. Oxycodone 30 mg q.4-6 hours as needed up to 5 tablets.  5. Requip 4 mg at bedtime.  6. Trazodone 100 mg at bedtime.  7. Baclofen 10 mg q.6 hours. 8. Neurontin 400 mg t.i.d.  9. Insulin 70/30, 55 units in the morning and 100 units at bedtime. 10. Fluoxetine 20 mg t.i.d.  11. Spironolactone 25 mg daily.  12. Klonopin 0.5 mg 2 at bedtime and 1 at 1:00 a.m.  13. Losartan 40 mg at bedtime.  14. Zanaflex 4 mg t.i.d.  15. Protonix 40 mg b.i.d.  16. Keppra 500 mg b.i.d.  17. Amlodipine 2.5 mg b.i.d.  18. Zofran 4  mg q.8 hours.   REVIEW OF SYSTEMS: Patient is very drowsy. He does arouse but goes back to sleep. Unable to provide reliable review of systems.   PHYSICAL EXAMINATION:  VITAL SIGNS: Temperature 97, heart rate 86, respiratory rate 12, blood pressure 111/67, pulse ox 94%.   GENERAL: Patient is a 59 year old Caucasian male who is lying in bed, not in acute distress.   HEAD: Atraumatic, normocephalic.    EYES: There is no pallor, icterus, or cyanosis. Pupils are equal, round, and reactive to light and accommodation.    ENT: Wet mucous membranes. No oropharyngeal erythema or thrush.   NECK: Supple. No masses. No JVD. No thyromegaly or lymphadenopathy.   CHEST WALL: No tenderness to palpation. Not using accessory muscles of respiration. No intercostal muscle retractions.   LUNGS: Bilaterally clear to auscultation. No wheezing, rales, or rhonchi.   CARDIOVASCULAR: S1, S2 regular. There is a systolic murmur. No rubs or gallops.   ABDOMEN: Soft, nontender, nondistended. No guarding. No rigidity. No organomegaly. Normal bowel sounds.   SKIN: No rashes or lesions.   PERIPHERIES: No pedal edema. 2+ pedal pulses.   MUSCULOSKELETAL: No cyanosis or clubbing.   NEUROLOGIC: Patient is very lethargic. He moves all four extremities. Arouses to painful stimuli but goes right back to sleep.   PSYCH: Unable to evaluate.   LABORATORY, DIAGNOSTIC AND RADIOLOGICAL DATA: Urinalysis shows no evidence of infection. Urine drug screen is positive for opiates. Alcohol less than 3. Glucose 49, BUN 34, creatinine 2.41. Normal potassium and sodium, rest of complete metabolic panel normal. White count 8, hemoglobin 12.7, hematocrit 4, platelet count 192, plasma ammonia less than 25. Chest x-ray shows no acute abnormality. CAT scan of the head shows no acute abnormalities.   ASSESSMENT AND PLAN: 59 year old male on multiple antidepressants, pain medications presents with encephalopathy.  1. Encephalopathy possibly  due to polypharmacy. Patient was recently started on Zanaflex and took it with oxycodone for the first time yesterday. Since then he has been very drowsy. Zanaflex has to be used with caution in patients with chronic kidney disease because it can cause hypotension, increased drowsiness and psychomotor abnormalities. Will hold all of patient's sedating medications. Will keep him n.p.o. until he is fully awake, alert. Will place on aspiration precautions and neuro checks. Patient's family is requesting that the patient's pain management physician be notified of the patient's reaction to Zanaflex since this is not a good choice for him.  2. Hypoglycemia. Patient took his regular dose of insulin but has not eaten since his breakfast which was inadequate due to his altered mental status. Will hold his insulin, keep him n.p.o. Start him on D5W. Will check Accu-Cheks without insulin coverage.  3. Chronic kidney disease. As per family patient has chronic kidney disease stage III to IV. No previous labs available for review. Will avoid nephrotoxic medications and monitor strict ins and outs.  4. Anemia, likely of chronic disease, chronic kidney disease. Hemoglobin is stable at present.  5. History of myoclonic tremors. Since patient is going to be n.p.o. will start him on Keppra IV, can be transitioned over to oral once patient is more awake and alert.  6. Hypertension. Patient's blood  pressure was borderline low. Will hold his antihypertensive medications for the time being.  7. History of congestive heart failure. It is unclear whether it is systolic or diastolic, appears to be well compensated at present. Patient was going to be n.p.o. Will give him gentle IV fluid hydration. Fluid intake will have to be reassessed to prevent fluid overload once patient is more awake and alert and able to take oral and is no longer hypoglycemic. 8. Depression and chronic pain syndrome. Patient is on multiple medications. Will hold  all of them because they are potentially sedating and interact with each other.   Discussed with the ER physician, discussed with the patient's wife and daughter the plan of care and management.   TIME SPENT: 75 minutes.   ____________________________ Cherre Huger, MD sp:cms D: 09/09/2011 20:47:03 ET T: 09/10/2011 07:36:55 ET JOB#: [id number]  cc: Cherre Huger, MD, <Dictator> Sharee Pimple, MD Cherre Huger MD ELECTRONICALLY SIGNED 09/12/2011 8:16

## 2014-10-08 ENCOUNTER — Encounter: Payer: Self-pay | Admitting: *Deleted

## 2015-03-28 DEATH — deceased
